# Patient Record
Sex: Female | Born: 1965 | Race: Black or African American | Hispanic: No | Marital: Married | Smoking: Never smoker
Health system: Southern US, Community
[De-identification: ages and names within clinical notes are randomized; demographics above are authoritative.]

## PROBLEM LIST (undated history)

## (undated) DIAGNOSIS — Z923 Personal history of irradiation: Secondary | ICD-10-CM

## (undated) DIAGNOSIS — C801 Malignant (primary) neoplasm, unspecified: Secondary | ICD-10-CM

## (undated) DIAGNOSIS — M2550 Pain in unspecified joint: Secondary | ICD-10-CM

## (undated) DIAGNOSIS — N611 Abscess of the breast and nipple: Secondary | ICD-10-CM

## (undated) DIAGNOSIS — Z9289 Personal history of other medical treatment: Secondary | ICD-10-CM

## (undated) DIAGNOSIS — C50919 Malignant neoplasm of unspecified site of unspecified female breast: Secondary | ICD-10-CM

## (undated) DIAGNOSIS — Z8709 Personal history of other diseases of the respiratory system: Secondary | ICD-10-CM

## (undated) HISTORY — PX: BREAST SURGERY: SHX581

## (undated) HISTORY — DX: Abscess of the breast and nipple: N61.1

## (undated) HISTORY — PX: TONSILLECTOMY: SUR1361

---

## 2004-07-07 ENCOUNTER — Emergency Department (HOSPITAL_COMMUNITY): Admission: EM | Admit: 2004-07-07 | Discharge: 2004-07-07 | Payer: Self-pay | Admitting: Emergency Medicine

## 2011-01-04 HISTORY — PX: DILATION AND CURETTAGE OF UTERUS: SHX78

## 2011-03-08 DIAGNOSIS — N611 Abscess of the breast and nipple: Secondary | ICD-10-CM

## 2011-03-08 HISTORY — PX: BREAST MASS EXCISION: SHX1267

## 2011-03-08 HISTORY — DX: Abscess of the breast and nipple: N61.1

## 2011-03-09 ENCOUNTER — Emergency Department (HOSPITAL_COMMUNITY)
Admission: EM | Admit: 2011-03-09 | Discharge: 2011-03-09 | Disposition: A | Discharge status: Home / Self Care | Payer: Medicaid Other | Attending: Emergency Medicine | Admitting: Emergency Medicine

## 2011-03-09 DIAGNOSIS — N61 Mastitis without abscess: Secondary | ICD-10-CM | POA: Insufficient documentation

## 2011-03-09 DIAGNOSIS — IMO0002 Reserved for concepts with insufficient information to code with codable children: Secondary | ICD-10-CM

## 2011-03-09 NOTE — ED Provider Notes (Signed)
History     CSN: [id number]  Arrival date & time 03/09/11  4098   First MD Initiated Contact with Patient 03/09/11 2208      Chief Complaint  Patient presents with  . Abscess    LT breast x last few months. Now "about to burst"    (Consider location/radiation/quality/duration/timing/severity/associated sxs/prior treatment) The history is provided by the patient.  Pt presents with CC of "abscess." She has had a growth to the L breast which has been present since approx Nov 2011; she stopped breast feeding her child in October and recalls it appeared several months later. It has been slowly growing in size since. She miscarried in Oct 2012 and had a D+C; her OB checked the area at that time and told her to return if the area got larger or ruptured. She feels that the area has been enlarging more rapidly since the miscarriage. She has not followed up with GYN on this since due to insurance problems.   Presents today as she feels increased pressure and pain in the area and feel like it is "about to pop." Denies fevers, chills, nausea, vomiting. Denies recent wt loss, malaise, appetite change. States she is not back on a regular menstrual cycle yet s/p SAB.  History reviewed. No pertinent past medical history.  Past Surgical History  Procedure Date  . Dilation and curettage of uterus     No family history on file.  History  Substance Use Topics  . Smoking status: Never Smoker   . Smokeless tobacco: Not on file  . Alcohol Use: No    OB History    Grav Para Term Preterm Abortions TAB SAB Ect Mult Living                  Review of Systems  Constitutional: Negative for fever, activity change, appetite change, fatigue and unexpected weight change.  HENT: Negative for facial swelling and neck pain.   Respiratory: Negative for cough, chest tightness and shortness of breath.   Cardiovascular: Negative for chest pain and palpitations.  Gastrointestinal: Negative for nausea and  vomiting.  Genitourinary: Positive for menstrual problem.  Skin: Positive for color change.  Neurological: Negative for dizziness, weakness and light-headedness.  Hematological: Negative for adenopathy.    Allergies  Sulfa antibiotics  Home Medications  No current outpatient prescriptions on file.  BP 127/43  Pulse 87  Temp(Src) 98.8 F (37.1 C) (Oral)  Resp 18  Ht 5\' 5"  (1.651 m)  Wt 175 lb (79.379 kg)  BMI 29.12 kg/m2  SpO2 100%  Physical Exam  Nursing note and vitals reviewed. Constitutional: She appears well-developed and well-nourished. No distress.  HENT:  Head: Normocephalic and atraumatic.  Eyes: Conjunctivae are normal. Right eye exhibits no discharge. Left eye exhibits no discharge.  Neck: Normal range of motion. Neck supple.  Cardiovascular: Normal rate, regular rhythm and normal heart sounds.   Pulmonary/Chest: Effort normal and breath sounds normal. She exhibits tenderness. Left breast exhibits mass, skin change and tenderness. Left breast exhibits no inverted nipple and no nipple discharge.    Abdominal: Soft. There is no tenderness.  Lymphadenopathy:    She has no cervical adenopathy.  Neurological: She is alert.  Skin: Skin is warm and dry. She is not diaphoretic.       See diagram above. Area of fluctuance c/w cyst (red area on diagram) measuring approximately 8x4 cm to L upper lateral breast. Area of very firm tissue concave to/beneath the cystic structure extending toward  the axilla and medially approximately to nipple line. Overlying skin is not erythematous or dimpled.  Psychiatric: She has a normal mood and affect.    ED Course  INCISION AND DRAINAGE Performed by: Grant Fontana Authorized by: Grant Fontana Consent: Verbal consent obtained. Risks and benefits: risks, benefits and alternatives were discussed Type: cyst Body area: trunk Location details: left breast Needle gauge: 18 Complexity: complex Drainage:  serosanguinous Drainage amount: copious Wound treatment: wound left open Packing material: none Patient tolerance: Patient tolerated the procedure well with no immediate complications.     Labs Reviewed - No data to display No results found.   1. Cyst       MDM  Pt case discussed with Dr. Rubin Payor, who saw the patient with me. We drained the area. The cystic area was drained of a large amount of serosanguinous material; the tissue underneath was quite firm to palpation. Given the length of time this has been present, it is certainly worrisome for a neoplastic process. Pt was strongly encouraged to make a f/u with GYN as well as gen surgery for further eval to include likely biopsy. Return precautions discussed. Pt verbalized understanding and agreed to plan.        Grant Fontana, [place] 03/10/11 1513

## 2011-03-09 NOTE — ED Notes (Signed)
Pt reports left breast abscess.Stopped breast feeding of October 2011. Pt reports it developed a few weeks after breastfeeding ended. Became much larger in the past few months. Pt reports she miscarried that pregnancy in October and it has since grown in size.  Pt reports MD looked at area during D/C and told her to come back if this area bursts.  Pt reports pain feels like strong pressure. Pt reports putting A/D ointment to help with lubrication.  Pt denies history of this and denies fevers.

## 2011-03-09 NOTE — ED Notes (Signed)
Pt reports abscess to left breast x44months - worse x2 months. Pt states it appears as though it is about to rupture. Pt denies any fever or body aches.

## 2011-03-10 NOTE — ED Provider Notes (Signed)
Medical screening examination/treatment/procedure(s) were performed by non-physician practitioner and as supervising physician I was immediately available for consultation/collaboration.  Juliet Rude. Rubin Payor, MD 03/10/11 2358

## 2011-03-11 ENCOUNTER — Ambulatory Visit (INDEPENDENT_AMBULATORY_CARE_PROVIDER_SITE_OTHER): Payer: Self-pay | Admitting: Surgery

## 2011-03-11 ENCOUNTER — Other Ambulatory Visit (INDEPENDENT_AMBULATORY_CARE_PROVIDER_SITE_OTHER): Payer: Self-pay | Admitting: Surgery

## 2011-03-11 ENCOUNTER — Encounter (INDEPENDENT_AMBULATORY_CARE_PROVIDER_SITE_OTHER): Payer: Self-pay | Admitting: Surgery

## 2011-03-11 VITALS — BP 118/70 | HR 68 | Temp 96.9°F | Resp 16 | Ht 65.0 in | Wt 191.0 lb

## 2011-03-11 DIAGNOSIS — N6320 Unspecified lump in the left breast, unspecified quadrant: Secondary | ICD-10-CM

## 2011-03-11 DIAGNOSIS — N6009 Solitary cyst of unspecified breast: Secondary | ICD-10-CM

## 2011-03-11 MED ORDER — HYDROCODONE-ACETAMINOPHEN 5-500 MG PO TABS: 1.0000 | ORAL_TABLET | ORAL | Status: AC | PRN

## 2011-03-11 NOTE — Patient Instructions (Signed)
Change the packing daily.  We will schedule your ultrasound on Monday and see you back on Tuesday.

## 2011-03-11 NOTE — Progress Notes (Signed)
Patient ID: Alyssa May, female   DOB: [birth year], 46 y.o.   MRN: [id number]  Chief Complaint  Patient presents with  . Follow-up    Breast abscess drained at [facility]    HPI Alyssa May is a 46 y.o. female.  Referred by Dr. Rubin May at [facility] for left breast mass HPI This is a healthy 46 year old female who first noticed a pea-sized mass in her left breast in November 2011. She had recently stopped breast-feeding. This area has been enlarging over the last year. In July of this last year she estimates that it was golfball sized. It has begun to protrude through the skin and causing some discomfort. The patient had a miscarriage in October 2012. She does not have a local primary care physician or a gynecologist. She presented to the emergency department on 03/09/11 2 to the increased discomfort. She was noted to have a 4 cm area of fluctuance with some fibrinous extending up towards the axilla and medially in the breast towards the nipple. The cystic area was aspirated and yielded a large amount of serosanguineous fluid. The patient states that the fluid has reaccumulated. Past Medical History  Diagnosis Date  . Breast abscess     Past Surgical History  Procedure Date  . Dilation and curettage of uterus 01/04/11    Family History  Problem Relation Age of Onset  . Diabetes Mother   . Hypertension Mother     Social History History  Substance Use Topics  . Smoking status: Never Smoker   . Smokeless tobacco: Never Used  . Alcohol Use: Yes     occasional    Allergies  Allergen Reactions  . Sulfa Antibiotics Swelling    Current Outpatient Prescriptions  Medication Sig Dispense Refill  . HYDROcodone-acetaminophen (VICODIN) 5-500 MG per tablet Take 1 tablet by mouth every 4 (four) hours as needed for pain.  30 tablet  0    Review of Systems Review of Systems  Constitutional: Negative for fever, chills and unexpected weight change.  HENT: Negative for hearing loss,  congestion, sore throat, trouble swallowing and voice change.   Eyes: Negative for visual disturbance.  Respiratory: Negative for cough and wheezing.   Cardiovascular: Negative for chest pain, palpitations and leg swelling.  Gastrointestinal: Negative for nausea, vomiting, abdominal pain, diarrhea, constipation, blood in stool, abdominal distention and anal bleeding.  Genitourinary: Negative for hematuria, vaginal bleeding and difficulty urinating.  Musculoskeletal: Negative for arthralgias.  Skin: Negative for rash and wound.  Neurological: Negative for seizures, syncope and headaches.  Hematological: Negative for adenopathy. Does not bruise/bleed easily.  Psychiatric/Behavioral: Negative for confusion.  Menarche - age 69 First pregnancy - age 62 Breastfeed - yes LMP - July Miscarriage October 2012 - has not restarted regular periods since then  Blood pressure 118/70, pulse 68, temperature 96.9 F (36.1 C), temperature source Temporal, resp. rate 16, height 5\' 5"  (1.651 m), weight 191 lb (86.637 kg).  Physical Exam Physical Exam Breasts - right breast - no masses, no nipple discharge or inversion, no axillary lymphadenopathy Left breast - 4 cm fluctuant, protruding area in the upper outer quadrant.  Surrounding this fluctuance, there is a wider 8-10 cm area of firmness extending towards the lower breast with some peau-d'orange skin changes. Data Reviewed None  Assessment    We made a 1 cm incision and drained a large amount of serosanguinous fluid.  This was sent for culture.  The cystic area decompressed completely.  It was packed with  1/4 inch nugauze.    The area of firmness could represent surrounding inflammation, but is also concerning for possible breast cancer.      Plan    Daily packing with nu-gauze. Reevaluate next week Left breast ultrasound.  Cannot do a mammogram at this time due to the open wound.  May require core biopsy vs skin biopsy to rule out malignancy.        Alyssa Brickman K. 03/11/2011, 3:28 PM

## 2011-03-14 ENCOUNTER — Ambulatory Visit
Admission: RE | Admit: 2011-03-14 | Discharge: 2011-03-14 | Disposition: A | Discharge status: Home / Self Care | Payer: Self-pay | Source: Ambulatory Visit | Attending: Surgery | Admitting: Surgery

## 2011-03-14 DIAGNOSIS — N6320 Unspecified lump in the left breast, unspecified quadrant: Secondary | ICD-10-CM

## 2011-03-14 LAB — WOUND CULTURE: Organism ID, Bacteria: NO GROWTH

## 2011-03-15 ENCOUNTER — Ambulatory Visit (INDEPENDENT_AMBULATORY_CARE_PROVIDER_SITE_OTHER): Payer: Self-pay | Admitting: Surgery

## 2011-03-15 ENCOUNTER — Encounter (INDEPENDENT_AMBULATORY_CARE_PROVIDER_SITE_OTHER): Payer: Self-pay | Admitting: Surgery

## 2011-03-15 VITALS — BP 124/86 | HR 96 | Temp 98.7°F | Ht 65.0 in | Wt 190.6 lb

## 2011-03-15 DIAGNOSIS — N6320 Unspecified lump in the left breast, unspecified quadrant: Secondary | ICD-10-CM

## 2011-03-15 DIAGNOSIS — N63 Unspecified lump in unspecified breast: Secondary | ICD-10-CM

## 2011-03-15 NOTE — Patient Instructions (Signed)
Neosporin over the wound daily.  Cover with dry gauze until the wound is completely healed.

## 2011-03-15 NOTE — Progress Notes (Signed)
The patient returns for a wound check.  The large fluctuant area is decompressed and appears free of infection.  I removed the packing.  The area surrounding the formerly fluctuant area remains very firm.  She will treat this area with neosporin and a dry dressing until healed.  She had an ultrasound yesterday which showed a 6.7 x 7.3 x 5.1 cm mass with enlarged adjacent lymph nodes in the axilla.  These findings are concerning for carcinoma.  The patient is trying to make some financial arrangements and then we will proceed with ultrasound-guided core biopsy to establish a definitive diagnosis.  I spent some time with the patient and her husband discussing the probability of this mass representing breast carcinoma.  We discussed the possibilities for treatment and surgery, which we will discuss further after establishing a diagnosis.  Follow-up in 2 weeks.  She will contact us when the financial situation is finalized.    Wilmon Arms. Corliss Skains, MD, [facility]  03/15/2011 3:15 PM

## 2011-03-18 ENCOUNTER — Ambulatory Visit
Admission: RE | Admit: 2011-03-18 | Discharge: 2011-03-18 | Disposition: A | Discharge status: Home / Self Care | Payer: No Typology Code available for payment source | Source: Ambulatory Visit | Attending: Obstetrics and Gynecology | Admitting: Obstetrics and Gynecology

## 2011-03-18 ENCOUNTER — Other Ambulatory Visit: Payer: Self-pay | Admitting: Obstetrics and Gynecology

## 2011-03-18 ENCOUNTER — Other Ambulatory Visit: Payer: Self-pay | Admitting: Diagnostic Radiology

## 2011-03-18 ENCOUNTER — Ambulatory Visit (INDEPENDENT_AMBULATORY_CARE_PROVIDER_SITE_OTHER): Payer: Medicaid Other | Admitting: *Deleted

## 2011-03-18 VITALS — BP 128/86 | HR 92 | Temp 98.5°F | Ht 65.0 in | Wt 193.3 lb

## 2011-03-18 DIAGNOSIS — N63 Unspecified lump in unspecified breast: Secondary | ICD-10-CM

## 2011-03-18 DIAGNOSIS — N6320 Unspecified lump in the left breast, unspecified quadrant: Secondary | ICD-10-CM

## 2011-03-18 DIAGNOSIS — Z01419 Encounter for gynecological examination (general) (routine) without abnormal findings: Secondary | ICD-10-CM

## 2011-03-18 NOTE — Progress Notes (Signed)
Complaints of left breast lump.  Pap Smear:    Completed Pap smear today. Last Pap smear was at least 3 years ago per patient and was normal. Per patient no history of abnormal Pap smears.  No Pap smear results in EPIC.  Physical exam: Breasts Left breast larger than right breast. Per patient her left breast has grown in size for the last year. No skin abnormalities right breast. Left breast lump that has protruded through skin at 1 o'clock 9-14 cm from left nipple. Lump draining serous colored fluid. Skin surround lump has the peu-d'orange appearance. No nipple retraction bilateral breasts. No nipple discharge bilateral breasts. No lymphadenopathy right axilla. Lymphadenopathy in left axilla.. No lumps palpated right breast. Palpated firm area left upper outer quadrant of breast with lump protruding at 1 o'clock. [facility] for left breast biopsy. Appointment scheduled for this afternoon at 1300.    Pelvic/Bimanual   Ext Genitalia No lesions, no swelling and no discharge observed on external genitalia.         Vagina Vagina pink and normal texture. No lesions observed in vagina. Sperm observed in vagina. Per patient has had recent sexual intercourse.     Cervix Cervix is present. Cervix pink and of normal texture. No discharge observed at cervical os.    Uterus Uterus is present and palpable. Uterus in normal position and normal size.       Adnexae Bilateral ovaries present and palpable. No tenderness on palpation.        Rectovaginal No rectal exam completed today since patient had no rectal complaints. No skin abnormalities observed at rectal area.

## 2011-03-18 NOTE — Patient Instructions (Signed)
Taught patient how to perform BSE and gave educational materials to take home. Completed Pap smear today and let her know BCCCP will cover Pap smears every 3 years unless has a history of abnormal Pap smears. Patient is scheduled for a left breast biopsy this afternoon at 1300. Patient aware of appointment and will be there. Let patient know will follow up with her within the next couple weeks with results by letter or phone. Patient verbalized understanding.

## 2011-03-21 ENCOUNTER — Other Ambulatory Visit: Payer: Self-pay | Admitting: Obstetrics and Gynecology

## 2011-03-21 ENCOUNTER — Telehealth (INDEPENDENT_AMBULATORY_CARE_PROVIDER_SITE_OTHER): Payer: Self-pay | Admitting: General Surgery

## 2011-03-21 ENCOUNTER — Other Ambulatory Visit (INDEPENDENT_AMBULATORY_CARE_PROVIDER_SITE_OTHER): Payer: Self-pay | Admitting: Surgery

## 2011-03-21 ENCOUNTER — Ambulatory Visit
Admission: RE | Admit: 2011-03-21 | Discharge: 2011-03-21 | Disposition: A | Discharge status: Home / Self Care | Payer: No Typology Code available for payment source | Source: Ambulatory Visit | Attending: Obstetrics and Gynecology | Admitting: Obstetrics and Gynecology

## 2011-03-21 DIAGNOSIS — N63 Unspecified lump in unspecified breast: Secondary | ICD-10-CM

## 2011-03-21 DIAGNOSIS — C50912 Malignant neoplasm of unspecified site of left female breast: Secondary | ICD-10-CM

## 2011-03-21 DIAGNOSIS — C50919 Malignant neoplasm of unspecified site of unspecified female breast: Secondary | ICD-10-CM

## 2011-03-21 NOTE — Telephone Encounter (Addendum)
I talked to [facility] from [facility] and she will call the pt tomorrow 03-22-11 to giver her a appt to see Dr Darnelle Catalan on the 03-24-11 or if she wants to go the [place] office she will call pt and schedule her there. And Dawn will call me and let me know what the pt will do

## 2011-03-22 ENCOUNTER — Telehealth: Payer: Self-pay | Admitting: *Deleted

## 2011-03-22 NOTE — Telephone Encounter (Signed)
Attempted to phone patient for BCCCP-MCD process, no answer.  Left message on home answering service with contact information.  Also tried cell but number was disconnected.

## 2011-03-24 ENCOUNTER — Telehealth: Payer: Self-pay | Admitting: *Deleted

## 2011-03-24 NOTE — Telephone Encounter (Signed)
Confirmed 03/28/11 appt w/ pt.  Unable to mail before appt letter - gave verbal.  Requested [place] & Dr. Darnelle Catalan.

## 2011-03-25 ENCOUNTER — Inpatient Hospital Stay (HOSPITAL_COMMUNITY): Admission: RE | Admit: 2011-03-25 | Payer: No Typology Code available for payment source | Source: Ambulatory Visit

## 2011-03-28 ENCOUNTER — Other Ambulatory Visit: Payer: No Typology Code available for payment source | Admitting: Lab

## 2011-03-28 ENCOUNTER — Other Ambulatory Visit (HOSPITAL_COMMUNITY): Payer: No Typology Code available for payment source

## 2011-03-28 ENCOUNTER — Ambulatory Visit: Payer: No Typology Code available for payment source

## 2011-03-28 ENCOUNTER — Ambulatory Visit (HOSPITAL_COMMUNITY)
Admission: RE | Admit: 2011-03-28 | Discharge: 2011-03-28 | Disposition: A | Discharge status: Home / Self Care | Payer: Medicaid Other | Source: Ambulatory Visit | Attending: Obstetrics and Gynecology | Admitting: Obstetrics and Gynecology

## 2011-03-28 ENCOUNTER — Ambulatory Visit (HOSPITAL_BASED_OUTPATIENT_CLINIC_OR_DEPARTMENT_OTHER): Payer: Medicaid Other | Admitting: Oncology

## 2011-03-28 ENCOUNTER — Other Ambulatory Visit: Payer: Self-pay | Admitting: *Deleted

## 2011-03-28 VITALS — BP 129/83 | HR 96 | Temp 98.5°F | Ht 65.0 in | Wt 187.4 lb

## 2011-03-28 DIAGNOSIS — C773 Secondary and unspecified malignant neoplasm of axilla and upper limb lymph nodes: Secondary | ICD-10-CM | POA: Insufficient documentation

## 2011-03-28 DIAGNOSIS — C50419 Malignant neoplasm of upper-outer quadrant of unspecified female breast: Secondary | ICD-10-CM | POA: Insufficient documentation

## 2011-03-28 DIAGNOSIS — N62 Hypertrophy of breast: Secondary | ICD-10-CM | POA: Insufficient documentation

## 2011-03-28 DIAGNOSIS — R599 Enlarged lymph nodes, unspecified: Secondary | ICD-10-CM | POA: Insufficient documentation

## 2011-03-28 MED ORDER — GADOBENATE DIMEGLUMINE 529 MG/ML IV SOLN
17.0000 mL | Freq: Once | INTRAVENOUS | Status: AC | PRN
  Administered 2011-03-28: 17 mL via INTRAVENOUS

## 2011-03-28 NOTE — Progress Notes (Signed)
ID: Alyssa May  DOB: [birth year]  MR#: [id number]  YQM#:[id number]  HPI: Alyssa May is a 46 year-old [place] woman who first noted a small mass in her Left breast November 2011, shortly after she stopped breast-feeding. It did not disappear with resumption of her periods and grew over the next year, and particularly since she had her miscarrriage October 2012. This brought her to the Emergency Room 03/09/2011 where she was found to have a fluctuant area in the UOQ of the Left breast measuring 8.4 cm. The tissue beneath the cystic area extending towards the axilla and medially was described as firm, but not erythematous. The cystic area was lanced and "a large amount of serosanguinos material" issued. The patient was referred to Dr. Marcille Blanco who saw her 03/11/2011. By this time the fluid had largely reaccumulated.[name] He incised the lesion, again draining serosanguinous liquid, packed it, and set the patient up for Left breast US at the [facility] 03/14/2011. This showed an irregularly marginated mass measuring 7.3 cm, with abnormal axillary adenopathy. On 01/1102013 bilateral mammography confirmed a high-density mass in the Left UOQ. This was biopsied, as was a Left axillary lymph node. [place] biopsies ([id number]) showed invasive ductal carcinoma, high grade, triple negative, with an Mib-1 of 97%.  The patient now presents for further discussion and a treatment plan.   ROS:  She has tolerated the procedures well, without significant bleeding, fever or pain. She is still having some drainage. Aside from the breast problem she is having some hot flashes, but no other systemic symptoms. A detailed review of systems was noncontributory  Allergies  Allergen Reactions  . Sulfa Antibiotics Swelling    Current Outpatient Prescriptions  Medication Sig Dispense Refill  . HYDROcodone-acetaminophen (NORCO) 5-325 MG per tablet Take 1 tablet by mouth every 6 (six) hours as needed.      . NON FORMULARY Pt  takes multiple supplements and vitamins Sees a holistic practioner       PMHx: s/p T&A; s/p D&C October 20   Family MW:[id number]'G father died from post-operative complications at age 59; the patient's mother is 40, lives in [place] The patient is a single child  GYN Hx: GX P1, first pregnancy to term age 61. 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. Her husband [name] is a Hospital doctor for a Acupuncturist in [place]. Their daughter, [name], is 3. The family stays part-time with the patient's mother in [place]  Objective: a young-appearing African-American woman  Filed Vitals:   03/28/11 1651  BP: 129/83  Pulse: 96  Temp: 98.5 F (36.9 C)    BMI: Body mass index is 31.18 kg/(m^2).   ECOG FS: 0  Physical Exam:   Sclerae unicteric  Oropharynx clear  No peripheral adenopathy  Lungs clear -- no rales or rhonchi  Heart regular rate and rhythm  Abdomen benign  MSK no focal spinal tenderness, no peripheral edema  Neuro nonfocal  Breast exam: the Right breast is unremarkable; the Left breast has a large solid hyperpigmented area in the UOQ measuring about 7 cm; the incisions are minimally leaking (clear drainage on bandage). The is a faint blush of erythema inferiorly in the breast (below the nipple) which the patient tells me was not present before the drainage and biopsy procedures--this may be reactive; it does not suggest cellulitis  Lab Results:      Chemistry  No results found for this basename: NA, K, CL, CO2, BUN, CREATININE, GLU   No results found for this basename: CALCIUM, ALKPHOS, AST, ALT, BILITOT       No results found for this basename: WBC, HGB, HCT, MCV, PLT, NEUTROABS    Studies/Results:  US Breast Left  03/14/2011  *RADIOLOGY REPORT*  Clinical Data:  The patient noted a mass in the left upper outer quadrant over 1 year ago.  She states that this has  gradually increased in size.   An incisional drainage was performed by the surgeon on 03/11/2011.  LEFT BREAST ULTRASOUND  Comparison:  None.  On physical exam, there is  an 8 cm protruding mass with a central incision in the 1 o'clock position of the left breast, approximately 10 cm from the left nipple.  The incision site has been packed.  Findings: Ultrasound is performed, showing an irregularly marginated mass at 1 o'clock 9-14 cm from the left nipple.  It is difficult to visualize the entire mass due to the incision and packing material.  This measures at least  6.7 x 7.3 x 5.1 cm.  The appearance is concerning for breast carcinoma.  Sonography of the left axilla demonstrates abnormal lymph nodes.  The patient will need bilateral diagnostic mammography when clinically feasible.  I would suggest ultrasound-guided core needle biopsy of the left breast mass and any other suspicious findings after the mammogram is done.  The patient has no insurance.  One may wish to consider referral to BCCCP (Breast and Cervical Cancer Control Program) prior to performance of a biopsy.  IMPRESSION: Suspicious mass in the left upper outer quadrant.  Suggest bilateral diagnostic mammography.  The patient will likely need ultrasound-guided core needle biopsy of the left breast mass.  One may wish to consider referral to BCCCP prior to performance of any biopsies.  BI-RADS CATEGORY 4:  Suspicious abnormality - biopsy should be considered.  Original Report Authenticated By: Daryl Eastern, M.D.    *RADIOLOGY REPORT*  Clinical Data:  Left breast mass  DIGITAL DIAGNOSTIC BILATERAL MAMMOGRAM WITH CAD  Comparison:  Ultrasound dated 03/14/2011  Findings:  There is a fibroglandular pattern.  A round, protuberant, high density mass with ill-defined margins is seen in the upper-outer quadrant of the left breast, posteriorly.  Locules of gas are seen in association with the mass in the upper-outer quadrant.  Skin thickening is noted in  the left breast.  No mass or malignant type calcifications are seen in the right breast. This correlates with the mass seen on most recent ultrasound.  Mammographic images were processed with CAD.  IMPRESSION: Mass, left breast which biopsy is done reported separately.  No mammographic evidence of malignancy, right breast.  BI-RADS CATEGORY 4:  Suspicious abnormality - biopsy should be considered.  Original Report Authenticated By: Hiram Gash, M.D.   Assessment: 46 year-old [place] woman s/p Left breast biopsy 03/18/2011 showing a clinical T3-4, N1, Stage III invasive ductal carcinoma, high grade, triple negative, with an Mib-1 of 97%   Plan: we spent the better part of her hour visit discussing Breast Cancer, the biology or her lesion, and treatment plans. She needs staging, and I am setting her up for labwork, Chest CT, PET, and echocardiography. She will need a port and will discuss this with Dr. Corliss Skains when she sees him tomorrow. She will come to chemo school to learn about chemotherapy side effect management and will see me again in about 10 days, by which time we  should have all the data in and can start to operationalize her chemotherapy.  I offered her the option of receiving some of her treatments in [place], but at this point she prefers to come to our clinic in [place].  MAGRINAT,GUSTAV C 03/28/2011

## 2011-03-29 ENCOUNTER — Encounter: Payer: Self-pay | Admitting: *Deleted

## 2011-03-29 ENCOUNTER — Other Ambulatory Visit: Payer: No Typology Code available for payment source

## 2011-03-29 ENCOUNTER — Telehealth: Payer: Self-pay | Admitting: Oncology

## 2011-03-29 ENCOUNTER — Ambulatory Visit: Payer: No Typology Code available for payment source

## 2011-03-29 DIAGNOSIS — C50419 Malignant neoplasm of upper-outer quadrant of unspecified female breast: Secondary | ICD-10-CM

## 2011-03-29 LAB — COMPREHENSIVE METABOLIC PANEL
ALT: 11 U/L (ref 0–35)
BUN: 4 mg/dL — ABNORMAL LOW (ref 6–23)
CO2: 25 mEq/L (ref 19–32)
Calcium: 9.2 mg/dL (ref 8.4–10.5)
Chloride: 95 mEq/L — ABNORMAL LOW (ref 96–112)
Creatinine, Ser: 0.73 mg/dL (ref 0.50–1.10)
Glucose, Bld: 97 mg/dL (ref 70–99)

## 2011-03-29 LAB — CBC WITH DIFFERENTIAL/PLATELET
EOS%: 0.4 % (ref 0.0–7.0)
Eosinophils Absolute: 0 10*3/uL (ref 0.0–0.5)
MCH: 20.1 pg — ABNORMAL LOW (ref 25.1–34.0)
MCV: 67 fL — ABNORMAL LOW (ref 79.5–101.0)
MONO%: 8.3 % (ref 0.0–14.0)
NEUT#: 5.6 10*3/uL (ref 1.5–6.5)
RBC: 4.27 10*6/uL (ref 3.70–5.45)
RDW: 20.7 % — ABNORMAL HIGH (ref 11.2–14.5)
nRBC: 0 % (ref 0–0)

## 2011-03-29 LAB — HCG, SERUM, QUALITATIVE: Preg, Serum: NEGATIVE

## 2011-03-29 LAB — CANCER ANTIGEN 27.29: CA 27.29: 16 U/mL (ref 0–39)

## 2011-03-29 NOTE — Telephone Encounter (Signed)
Pt called to cancel her appts for today °

## 2011-03-30 ENCOUNTER — Ambulatory Visit (INDEPENDENT_AMBULATORY_CARE_PROVIDER_SITE_OTHER): Payer: PRIVATE HEALTH INSURANCE | Admitting: Surgery

## 2011-03-30 ENCOUNTER — Telehealth: Payer: Self-pay | Admitting: *Deleted

## 2011-03-30 ENCOUNTER — Encounter (HOSPITAL_BASED_OUTPATIENT_CLINIC_OR_DEPARTMENT_OTHER): Payer: Self-pay | Admitting: *Deleted

## 2011-03-30 ENCOUNTER — Encounter (INDEPENDENT_AMBULATORY_CARE_PROVIDER_SITE_OTHER): Payer: Self-pay | Admitting: Surgery

## 2011-03-30 DIAGNOSIS — N61 Mastitis without abscess: Secondary | ICD-10-CM

## 2011-03-30 DIAGNOSIS — C50419 Malignant neoplasm of upper-outer quadrant of unspecified female breast: Secondary | ICD-10-CM

## 2011-03-30 NOTE — Progress Notes (Addendum)
The patient returns after her consultation with Dr. Darnelle Catalan and her MRI on 1/21.  She is having a lot of pain in her left breast with purulent drainage from the I&D site.  Dr. Darrall Dears tentative plan is to proceed with neoadjuvant chemotherapy.  She has undergone chemo class and understands the need for a port.    MRI report below.      **ADDENDUM** CREATED: 03/29/2011 13:20:15   There is also diffuse sternal enhancement.  This is nonspecific and could be an indication of bony metastatic disease.   Addended by:  Londell Moh. Azucena Kuba, M.D. on 03/29/2011 13:20:15.   **END ADDENDUM** SIGNED BY: Londell Moh. Azucena Kuba, M.D.       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   BILATERAL BREAST MRI WITH AND WITHOUT CONTRAST   Technique: Multiplanar, multisequence MR images of both breasts were obtained prior to and following the intravenous administration of 17ml of MultiHance.  Three dimensional images were evaluated at the independent DynaCad workstation.   Comparison:  Recent mammogram, ultrasound and biopsy examinations.   Findings: Large, irregularly marginated enhancing mass deep in the upper outer quadrant of the left breast, protruding from the breast. This measures 10.2 x 7.6 x 6.7 cm in maximum dimensions. This contains a biopsy marker clip artifact laterally.  This has a central nonenhancing portion containing a fluid/hematocrit or fluid/protein level.  This corresponds to an area recently incised and drained by history.  This mass has a mixture of enhancement kinetics, including rapid washin/washout.  The mass is abutting the anterolateral portions of the left pectoralis major and minor muscles with loss of the normal fat plane between the mass and the muscles.  It is difficult to determine if there is actual extension into the muscles.   Also demonstrated is diffuse enlargement of the left breast relative to the  right breast with marked diffuse skin thickening on the left.  There is also mild diffuse skin enhancement on the left as well as mild diffuse inhomogeneous enhancement in the majority of the parenchyma of the left breast.   Multiple enlarged left axillary lymph nodes are demonstrated lateral to the pectoralis minor muscle on the left as well as an enlarged lymph node posterior to the lateral aspect of the pectoralis minor muscle on the left.  The largest node measures 1.9 cm in short axis diameter on inversion recovery image number 48.   Minimal background parenchymal enhancement in the right breast. Scattered small nodular areas of enhancement in the right breast with no dominant masses or areas of enhancement suspicious for malignancy.  No abnormal appearing internal mammary or right axillary lymph nodes.   IMPRESSION:   1.  10.2 x 7.6 x 6.7 cm biopsy-proven invasive ductal carcinoma deep in the upper outer quadrant of the left breast extending into and through the skin of the breast.  This is abutting the pectoralis major and minor muscles with possible minimal muscle invasion. 2.  Multiple metastatic level I left axillary lymph nodes and a single metastatic level II left axillary lymph node. 3.  Diffuse enlargement, edematous changes and enhancement in the left breast compatible with changes due to an inflammatory component of the patient's known left breast breast carcinoma. 4.  No evidence of malignancy on the right.   THREE-DIMENSIONAL MR IMAGE RENDERING ON INDEPENDENT WORKSTATION:   Three-dimensional MR images were rendered by post-processing of the original MR data on an independent workstation.  The three- dimensional MR images were interpreted, and findings were reported in the accompanying complete MRI report for this study.   BI-RADS CATEGORY 6:  Known biopsy-proven malignancy - appropriate action should be taken.   Recommendation:  Treatment plan.   Original  Report Authenticated By: Darrol Angel, M.D.       The left breast has three small openings with some purulent fluid draining.  There is some overlying erythema, as well as the previously noted firm breast cancer.    Imp:  Left breast cancer with central necrosis and abscess with partial drainage  Plan:  Prior to port insertion, I think the patient would benefit from a wide incision and drainage under anesthesia with washout of the breast abscess.  Once the infection starts to clear up, we will proceed with port insertion within the next 1-2 weeks.  I am concerned that inserting a port in the presence of an active abscess might result in bacteremia with an infected port.  We will try to schedule the incision and drainage for tomorrow.  Wilmon Arms. Corliss Skains, MD, [facility]  03/30/2011 1:18 PM

## 2011-03-30 NOTE — Patient Instructions (Signed)
We will schedule your breast abscess drainage for tomorrow and then we will schedule your port insertion for the next 1-2 weeks.

## 2011-03-30 NOTE — Telephone Encounter (Signed)
Called patient at 1315 to get information for her [employer] Medicaid and discuss a time to meet for her to sign needed paperwork. Patient called me back at 1420 and will meet me next Monday, January 28th at 1100 to complete her [facility] Medicaid paperwork. Gave patient results to Pap smear. Let her know her Pap smear was normal and she will not need one for 3 years per BCCCP guidelines. Patient verbalized understanding.

## 2011-03-31 ENCOUNTER — Encounter: Payer: Self-pay | Admitting: *Deleted

## 2011-03-31 ENCOUNTER — Encounter (HOSPITAL_BASED_OUTPATIENT_CLINIC_OR_DEPARTMENT_OTHER): Payer: Self-pay | Admitting: Certified Registered Nurse Anesthetist

## 2011-03-31 ENCOUNTER — Encounter (HOSPITAL_BASED_OUTPATIENT_CLINIC_OR_DEPARTMENT_OTHER)
Admission: RE | Disposition: A | Discharge status: Home / Self Care | Payer: Self-pay | Source: Ambulatory Visit | Attending: Surgery

## 2011-03-31 ENCOUNTER — Other Ambulatory Visit (INDEPENDENT_AMBULATORY_CARE_PROVIDER_SITE_OTHER): Payer: Self-pay | Admitting: Surgery

## 2011-03-31 ENCOUNTER — Ambulatory Visit (HOSPITAL_BASED_OUTPATIENT_CLINIC_OR_DEPARTMENT_OTHER): Payer: Medicaid Other | Admitting: Certified Registered Nurse Anesthetist

## 2011-03-31 ENCOUNTER — Ambulatory Visit (HOSPITAL_BASED_OUTPATIENT_CLINIC_OR_DEPARTMENT_OTHER)
Admission: RE | Admit: 2011-03-31 | Discharge: 2011-03-31 | Disposition: A | Discharge status: Home / Self Care | Payer: Medicaid Other | Source: Ambulatory Visit | Attending: Surgery | Admitting: Surgery

## 2011-03-31 DIAGNOSIS — N61 Mastitis without abscess: Secondary | ICD-10-CM

## 2011-03-31 DIAGNOSIS — C50419 Malignant neoplasm of upper-outer quadrant of unspecified female breast: Secondary | ICD-10-CM | POA: Insufficient documentation

## 2011-03-31 DIAGNOSIS — C801 Malignant (primary) neoplasm, unspecified: Secondary | ICD-10-CM | POA: Insufficient documentation

## 2011-03-31 HISTORY — DX: Malignant (primary) neoplasm, unspecified: C80.1

## 2011-03-31 HISTORY — PX: INCISION AND DRAINAGE BREAST ABSCESS: SUR672

## 2011-03-31 SURGERY — INCISION AND DRAINAGE, ABSCESS
Anesthesia: Monitor Anesthesia Care | Site: Breast | Laterality: Left | Wound class: Dirty or Infected

## 2011-03-31 MED ORDER — PROMETHAZINE HCL 25 MG/ML IJ SOLN: 12.5000 mg | Freq: Four times a day (QID) | INTRAMUSCULAR | Status: DC | PRN

## 2011-03-31 MED ORDER — MIDAZOLAM HCL 2 MG/2ML IJ SOLN: 0.5000 mg | INTRAMUSCULAR | Status: DC | PRN

## 2011-03-31 MED ORDER — ONDANSETRON HCL 4 MG/2ML IJ SOLN
INTRAMUSCULAR | Status: DC | PRN
  Administered 2011-03-31: 4 mg via INTRAVENOUS

## 2011-03-31 MED ORDER — OXYCODONE-ACETAMINOPHEN 5-325 MG PO TABS: 1.0000 | ORAL_TABLET | ORAL | Status: AC | PRN

## 2011-03-31 MED ORDER — ACETAMINOPHEN 10 MG/ML IV SOLN
1000.0000 mg | Freq: Once | INTRAVENOUS | Status: AC
  Administered 2011-03-31: 1000 mg via INTRAVENOUS

## 2011-03-31 MED ORDER — METOCLOPRAMIDE HCL 5 MG/ML IJ SOLN: 10.0000 mg | Freq: Once | INTRAMUSCULAR | Status: DC | PRN

## 2011-03-31 MED ORDER — ONDANSETRON HCL 4 MG/2ML IJ SOLN: 4.0000 mg | INTRAMUSCULAR | Status: DC | PRN

## 2011-03-31 MED ORDER — ACETAMINOPHEN 650 MG RE SUPP: 650.0000 mg | RECTAL | Status: DC | PRN

## 2011-03-31 MED ORDER — FENTANYL CITRATE 0.05 MG/ML IJ SOLN
25.0000 ug | INTRAMUSCULAR | Status: DC | PRN
  Administered 2011-03-31: 50 ug via INTRAVENOUS

## 2011-03-31 MED ORDER — SODIUM CHLORIDE 0.9 % IJ SOLN: 3.0000 mL | Freq: Two times a day (BID) | INTRAMUSCULAR | Status: DC

## 2011-03-31 MED ORDER — OXYCODONE HCL 5 MG PO TABS
5.0000 mg | ORAL_TABLET | ORAL | Status: DC | PRN
  Administered 2011-03-31: 5 mg via ORAL

## 2011-03-31 MED ORDER — 0.9 % SODIUM CHLORIDE (POUR BTL) OPTIME
TOPICAL | Status: DC | PRN
  Administered 2011-03-31: 1000 mL

## 2011-03-31 MED ORDER — LACTATED RINGERS IV SOLN
INTRAVENOUS | Status: DC
  Administered 2011-03-31 (×2): via INTRAVENOUS

## 2011-03-31 MED ORDER — MORPHINE SULFATE 2 MG/ML IJ SOLN: 2.0000 mg | INTRAMUSCULAR | Status: DC | PRN

## 2011-03-31 MED ORDER — MIDAZOLAM HCL 5 MG/5ML IJ SOLN
INTRAMUSCULAR | Status: DC | PRN
  Administered 2011-03-31: 2 mg via INTRAVENOUS

## 2011-03-31 MED ORDER — MORPHINE SULFATE 2 MG/ML IJ SOLN: 0.0500 mg/kg | INTRAMUSCULAR | Status: DC | PRN

## 2011-03-31 MED ORDER — PROPOFOL 10 MG/ML IV EMUL
INTRAVENOUS | Status: DC | PRN
  Administered 2011-03-31: 75 ug/kg/min via INTRAVENOUS

## 2011-03-31 MED ORDER — SODIUM CHLORIDE 0.9 % IV SOLN: 250.0000 mL | INTRAVENOUS | Status: DC | PRN

## 2011-03-31 MED ORDER — LIDOCAINE HCL (CARDIAC) 20 MG/ML IV SOLN
INTRAVENOUS | Status: DC | PRN
  Administered 2011-03-31: 60 mg via INTRAVENOUS

## 2011-03-31 MED ORDER — BUPIVACAINE-EPINEPHRINE PF 0.25-1:200000 % IJ SOLN
INTRAMUSCULAR | Status: DC | PRN
  Administered 2011-03-31: 12 mL

## 2011-03-31 MED ORDER — ACETAMINOPHEN 325 MG PO TABS: 650.0000 mg | ORAL_TABLET | ORAL | Status: DC | PRN

## 2011-03-31 MED ORDER — FENTANYL CITRATE 0.05 MG/ML IJ SOLN
INTRAMUSCULAR | Status: DC | PRN
  Administered 2011-03-31: 50 ug via INTRAVENOUS

## 2011-03-31 MED ORDER — SODIUM CHLORIDE 0.9 % IJ SOLN: 3.0000 mL | INTRAMUSCULAR | Status: DC | PRN

## 2011-03-31 MED ORDER — CEFAZOLIN SODIUM-DEXTROSE 2-3 GM-% IV SOLR
2.0000 g | INTRAVENOUS | Status: AC
  Administered 2011-03-31: 2 g via INTRAVENOUS

## 2011-03-31 SURGICAL SUPPLY — 45 items
BANDAGE GAUZE ELAST BULKY 4 IN (GAUZE/BANDAGES/DRESSINGS) ×2 IMPLANT
BENZOIN TINCTURE PRP APPL 2/3 (GAUZE/BANDAGES/DRESSINGS) IMPLANT
BLADE HEX COATED 2.75 (ELECTRODE) ×2 IMPLANT
BLADE SURG 15 STRL LF DISP TIS (BLADE) ×1 IMPLANT
BLADE SURG 15 STRL SS (BLADE) ×1
CANISTER SUCTION 1200CC (MISCELLANEOUS) ×2 IMPLANT
CHLORAPREP W/TINT 26ML (MISCELLANEOUS) IMPLANT
CLOTH BEACON ORANGE TIMEOUT ST (SAFETY) ×2 IMPLANT
COVER MAYO STAND STRL (DRAPES) ×2 IMPLANT
COVER TABLE BACK 60X90 (DRAPES) ×2 IMPLANT
DECANTER SPIKE VIAL GLASS SM (MISCELLANEOUS) IMPLANT
DRAPE PED LAPAROTOMY (DRAPES) ×2 IMPLANT
DRAPE UTILITY XL STRL (DRAPES) ×2 IMPLANT
DRSG PAD ABDOMINAL 8X10 ST (GAUZE/BANDAGES/DRESSINGS) ×2 IMPLANT
DRSG TEGADERM 4X4.75 (GAUZE/BANDAGES/DRESSINGS) IMPLANT
ELECT REM PT RETURN 9FT ADLT (ELECTROSURGICAL) ×2
ELECTRODE REM PT RTRN 9FT ADLT (ELECTROSURGICAL) ×1 IMPLANT
GAUZE SPONGE 4X4 12PLY STRL LF (GAUZE/BANDAGES/DRESSINGS) ×2 IMPLANT
GLOVE BIO SURGEON STRL SZ 6.5 (GLOVE) ×2 IMPLANT
GLOVE BIO SURGEON STRL SZ7 (GLOVE) ×4 IMPLANT
GLOVE BIOGEL PI IND STRL 7.0 (GLOVE) ×1 IMPLANT
GLOVE BIOGEL PI INDICATOR 7.0 (GLOVE) ×1
GOWN PREVENTION PLUS XLARGE (GOWN DISPOSABLE) ×6 IMPLANT
HEMOSTAT SURGICEL 2X14 (HEMOSTASIS) ×2 IMPLANT
NEEDLE HYPO 25X1 1.5 SAFETY (NEEDLE) ×2 IMPLANT
NS IRRIG 1000ML POUR BTL (IV SOLUTION) ×2 IMPLANT
PACK BASIN DAY SURGERY FS (CUSTOM PROCEDURE TRAY) ×2 IMPLANT
PENCIL BUTTON HOLSTER BLD 10FT (ELECTRODE) ×2 IMPLANT
SLEEVE SCD COMPRESS KNEE MED (MISCELLANEOUS) ×2 IMPLANT
SPONGE LAP 4X18 X RAY DECT (DISPOSABLE) ×2 IMPLANT
STRIP CLOSURE SKIN 1/2X4 (GAUZE/BANDAGES/DRESSINGS) IMPLANT
SUT CHROMIC 3 0 SH 27 (SUTURE) IMPLANT
SUT MON AB 4-0 PC3 18 (SUTURE) IMPLANT
SUT VIC AB 3-0 SH 27 (SUTURE)
SUT VIC AB 3-0 SH 27X BRD (SUTURE) IMPLANT
SWAB CULTURE LIQ STUART DBL (MISCELLANEOUS) IMPLANT
SYR BULB 3OZ (MISCELLANEOUS) ×2 IMPLANT
SYR CONTROL 10ML LL (SYRINGE) ×2 IMPLANT
TOWEL OR 17X24 6PK STRL BLUE (TOWEL DISPOSABLE) IMPLANT
TOWEL OR NON WOVEN STRL DISP B (DISPOSABLE) ×2 IMPLANT
TRAY DSU PREP LF (CUSTOM PROCEDURE TRAY) ×2 IMPLANT
TUBE ANAEROBIC SPECIMEN COL (MISCELLANEOUS) IMPLANT
TUBE CONNECTING 20X1/4 (TUBING) ×2 IMPLANT
WATER STERILE IRR 1000ML POUR (IV SOLUTION) IMPLANT
YANKAUER SUCT BULB TIP NO VENT (SUCTIONS) ×2 IMPLANT

## 2011-03-31 NOTE — Progress Notes (Signed)
Mailed after appt letter to pt. 

## 2011-03-31 NOTE — Op Note (Signed)
Pre-Op Dx - left breast cancer with left breast abscess Post-Op Dx - Same Procedure - Incision and drainage of left breast abscess Triston Skare K. Local-MAC  Indications - 46 yo female with recently diagnosed 10.4 cm infiltrating ductal carcinoma of the left breast.  She presented with a large central abscess, likely from central necrosis of the cancer.  This was drained locally in the office, but has continued to drain purulent fluid.  She presents now for wide debridement of the wound with washout.  Description of  Procedure:  The patient was brought to the operating room and placed in a supine position on the operating room table.  Her left breast was prepped with betadine and draped in sterile fashion.  She was given intravenous sedation.  A time out was taken.  We anesthetized the area around the draining abscess with 0.25% Marcaine with epinephrine.  I excised some skin and necrotic tissue overlying a large abscess cavity.  We drained a moderate amount of purulent fluid.   The wound measures 2 x 3 x 4 cm deep, extending deep into the tumor.  There was significant oozing of the sides of the cavity.  We spent a considerable amount of time using cautery, Surgicel gauze, and direct pressure to obtain hemostasis.  The wound was thoroughly irrigated and hemostasis was felt to be adequate.  The surgicel was left deep in the wound.  The wound was then packed with approximately 12 inches of Kerlix gauze moistened with saline.  A sterile dressing was applied.  The patient was then awakened and brought to the recovery room in stable condition.  Wilmon Arms. Corliss Skains, MD, [facility] Surgery  03/31/2011 1:23 PM

## 2011-03-31 NOTE — Transfer of Care (Signed)
Immediate Anesthesia Transfer of Care Note  Patient: Alyssa May  Procedure(s) Performed:  INCISION AND DRAINAGE ABSCESS - incision and drainage left breast abscess  Patient Location: PACU  Anesthesia Type: MAC  Level of Consciousness: awake, alert , oriented and patient cooperative  Airway & Oxygen Therapy: Patient Spontanous Breathing and Patient connected to face mask oxygen  Post-op Assessment: Report given to PACU RN and Post -op Vital signs reviewed and stable  Post vital signs: Reviewed and stable  Complications: No apparent anesthesia complications

## 2011-03-31 NOTE — Anesthesia Preprocedure Evaluation (Signed)
Anesthesia Evaluation  Patient identified by MRN, date of birth, ID band Patient awake    Reviewed: Allergy & Precautions, H&P , NPO status , Patient's Chart, lab work & pertinent test results, reviewed documented beta blocker date and time   Airway Mallampati: II TM Distance: >3 FB Neck ROM: full    Dental   Pulmonary neg pulmonary ROS,          Cardiovascular neg cardio ROS     Neuro/Psych Negative Neurological ROS  Negative Psych ROS   GI/Hepatic negative GI ROS, Neg liver ROS,   Endo/Other  Negative Endocrine ROS  Renal/GU negative Renal ROS  Genitourinary negative   Musculoskeletal   Abdominal   Peds  Hematology negative hematology ROS (+)   Anesthesia Other Findings See surgeon's H&P   Reproductive/Obstetrics negative OB ROS                           Anesthesia Physical Anesthesia Plan  ASA: II  Anesthesia Plan: MAC   Post-op Pain Management:    Induction: Intravenous  Airway Management Planned: Simple Face Mask  Additional Equipment:   Intra-op Plan:   Post-operative Plan:   Informed Consent: I have reviewed the patients History and Physical, chart, labs and discussed the procedure including the risks, benefits and alternatives for the proposed anesthesia with the patient or authorized representative who has indicated his/her understanding and acceptance.     Plan Discussed with: CRNA and Surgeon  Anesthesia Plan Comments:         Anesthesia Quick Evaluation  

## 2011-03-31 NOTE — Anesthesia Postprocedure Evaluation (Signed)
Anesthesia Post Note  Patient: Alyssa May  Procedure(s) Performed:  INCISION AND DRAINAGE ABSCESS - incision and drainage left breast abscess  Anesthesia type: MAC  Patient location: PACU  Post pain: Pain level controlled  Post assessment: Patient's Cardiovascular Status Stable  Last Vitals:  Filed Vitals:   03/31/11 1415  BP: 105/64  Pulse: 72  Temp:   Resp: 13    Post vital signs: Reviewed and stable  Level of consciousness: alert  Complications: No apparent anesthesia complications

## 2011-03-31 NOTE — H&P (Signed)
HPI Alyssa May is a 46 y.o. female.  Referred by Dr. Rubin Payor at [facility] ED for left breast mass HPI This is a healthy 46 year old female who first noticed a pea-sized mass in her left breast in November 2011. She had recently stopped breast-feeding. This area has been enlarging over the last year. In July of this last year she estimates that it was golfball sized. It has begun to protrude through the skin and causing some discomfort. The patient had a miscarriage in October 2012. She does not have a local primary care physician or a gynecologist. She presented to the emergency department on 03/09/11 2 to the increased discomfort. She was noted to have a 4 cm area of fluctuance with some fibrinous extending up towards the axilla and medially in the breast towards the nipple. The cystic area was aspirated and yielded a large amount of serosanguineous fluid. The patient states that the fluid has reaccumulated. Past Medical History   Diagnosis  Date   .  Breast abscess           Past Surgical History   Procedure  Date   .  Dilation and curettage of uterus  01/04/11         Family History   Problem  Relation  Age of Onset   .  Diabetes  Mother     .  Hypertension  Mother          Social History History   Substance Use Topics   .  Smoking status:  Never Smoker    .  Smokeless tobacco:  Never Used   .  Alcohol Use:  Yes         occasional         Allergies   Allergen  Reactions   .  Sulfa Antibiotics  Swelling         Current Outpatient Prescriptions   Medication  Sig  Dispense  Refill   .  HYDROcodone-acetaminophen (VICODIN) 5-500 MG per tablet  Take 1 tablet by mouth every 4 (four) hours as needed for pain.   30 tablet   0        Review of Systems Review of Systems  Constitutional: Negative for fever, chills and unexpected weight change.  HENT: Negative for hearing loss, congestion, sore throat, trouble swallowing and voice change.   Eyes: Negative for  visual disturbance.  Respiratory: Negative for cough and wheezing.   Cardiovascular: Negative for chest pain, palpitations and leg swelling.  Gastrointestinal: Negative for nausea, vomiting, abdominal pain, diarrhea, constipation, blood in stool, abdominal distention and anal bleeding.  Genitourinary: Negative for hematuria, vaginal bleeding and difficulty urinating.  Musculoskeletal: Negative for arthralgias.  Skin: Negative for rash and wound.  Neurological: Negative for seizures, syncope and headaches.  Hematological: Negative for adenopathy. Does not bruise/bleed easily.  Psychiatric/Behavioral: Negative for confusion.  Menarche - age 56 First pregnancy - age 46 Breastfeed - yes LMP - July Miscarriage October 2012 - has not restarted regular periods since then   Blood pressure 118/70, pulse 68, temperature 96.9 F (36.1 C), temperature source Temporal, resp. rate 16, height 5\' 5"  (1.651 m), weight 191 lb (86.637 kg).    Physical Exam Breasts - right breast - no masses, no nipple discharge or inversion, no axillary lymphadenopathy Left breast - 4 cm fluctuant, protruding area in the upper outer quadrant with opening draining some purulent fluid.  Surrounding this fluctuance, there is a wider 8-10 cm area of  firmness extending towards the lower breast with some peau-d'orange skin changes. Data Reviewed None   Assessment  left breast cancer with central necrosis and abscess.     Plan    Will drain in OR today prior to placing port.      Wilmon Arms. Corliss Skains, MD, [facility] Surgery  03/31/2011 12:15 PM

## 2011-04-04 ENCOUNTER — Ambulatory Visit (HOSPITAL_COMMUNITY)
Admission: RE | Admit: 2011-04-04 | Discharge: 2011-04-04 | Disposition: A | Discharge status: Home / Self Care | Payer: Medicaid Other | Source: Ambulatory Visit | Attending: Oncology | Admitting: Oncology

## 2011-04-04 DIAGNOSIS — C50919 Malignant neoplasm of unspecified site of unspecified female breast: Secondary | ICD-10-CM | POA: Insufficient documentation

## 2011-04-04 DIAGNOSIS — I10 Essential (primary) hypertension: Secondary | ICD-10-CM | POA: Insufficient documentation

## 2011-04-04 DIAGNOSIS — C801 Malignant (primary) neoplasm, unspecified: Secondary | ICD-10-CM

## 2011-04-04 NOTE — Progress Notes (Signed)
  Echocardiogram 2D Echocardiogram has been performed.  Juanita Laster Kymberlie Brazeau 04/04/2011, 11:32 AM

## 2011-04-05 ENCOUNTER — Ambulatory Visit (HOSPITAL_COMMUNITY): Payer: Medicaid Other | Attending: Oncology

## 2011-04-06 ENCOUNTER — Encounter (INDEPENDENT_AMBULATORY_CARE_PROVIDER_SITE_OTHER): Payer: Self-pay | Admitting: Surgery

## 2011-04-07 ENCOUNTER — Inpatient Hospital Stay (HOSPITAL_COMMUNITY): Admission: RE | Admit: 2011-04-07 | Payer: No Typology Code available for payment source | Source: Ambulatory Visit

## 2011-04-07 ENCOUNTER — Ambulatory Visit (INDEPENDENT_AMBULATORY_CARE_PROVIDER_SITE_OTHER): Payer: PRIVATE HEALTH INSURANCE | Admitting: Surgery

## 2011-04-07 ENCOUNTER — Encounter (INDEPENDENT_AMBULATORY_CARE_PROVIDER_SITE_OTHER): Payer: Self-pay | Admitting: Surgery

## 2011-04-07 ENCOUNTER — Encounter (HOSPITAL_COMMUNITY): Payer: Self-pay | Admitting: Pharmacy Technician

## 2011-04-07 ENCOUNTER — Inpatient Hospital Stay (HOSPITAL_COMMUNITY)
Admission: RE | Admit: 2011-04-07 | Discharge: 2011-04-07 | Payer: No Typology Code available for payment source | Source: Ambulatory Visit | Attending: Oncology | Admitting: Oncology

## 2011-04-07 VITALS — BP 120/80 | HR 92 | Temp 98.4°F | Ht 65.0 in | Wt 191.0 lb

## 2011-04-07 DIAGNOSIS — C50419 Malignant neoplasm of upper-outer quadrant of unspecified female breast: Secondary | ICD-10-CM

## 2011-04-07 NOTE — Progress Notes (Signed)
The patient called this morning with some concern about bleeding from the wound.  We worked her into the office today.  She is still having some tenderness from the tumor.  The wound is open and has some granulation tissue.  There is some oozing from the wound and some foul-smelling drainage from the necrotic portion of the tumor.  I removed some of the surgicel that was placed at the time of surgery and replaced it with GelFoam and two saline-soaked 4x4 gauze sponges.  She will leave these in place until Saturday before resuming dressing changes.  This wound will likely be slow in healing due to the fact that it is in the middle of a large necrotic tumor.  We will plan her port placement next week.  Hopefully, as she receives chemo, the tumor will shrink and will heal more rapidly.  Wilmon Arms. Corliss Skains, MD, [facility] Surgery  04/07/2011 4:43 PM

## 2011-04-08 ENCOUNTER — Ambulatory Visit (HOSPITAL_BASED_OUTPATIENT_CLINIC_OR_DEPARTMENT_OTHER): Payer: PRIVATE HEALTH INSURANCE | Admitting: Oncology

## 2011-04-08 ENCOUNTER — Ambulatory Visit: Payer: No Typology Code available for payment source | Admitting: Oncology

## 2011-04-08 ENCOUNTER — Encounter (HOSPITAL_COMMUNITY): Payer: Self-pay

## 2011-04-08 ENCOUNTER — Encounter (HOSPITAL_COMMUNITY)
Admission: RE | Admit: 2011-04-08 | Discharge: 2011-04-08 | Disposition: A | Discharge status: Home / Self Care | Payer: Medicaid Other | Source: Ambulatory Visit | Attending: Surgery | Admitting: Surgery

## 2011-04-08 ENCOUNTER — Telehealth: Payer: Self-pay | Admitting: Oncology

## 2011-04-08 VITALS — BP 128/81 | HR 88 | Temp 98.5°F | Ht 65.0 in | Wt 190.0 lb

## 2011-04-08 DIAGNOSIS — C50419 Malignant neoplasm of upper-outer quadrant of unspecified female breast: Secondary | ICD-10-CM

## 2011-04-08 LAB — CBC
HCT: 27.8 % — ABNORMAL LOW (ref 36.0–46.0)
Hemoglobin: 8 g/dL — ABNORMAL LOW (ref 12.0–15.0)
MCH: 19.4 pg — ABNORMAL LOW (ref 26.0–34.0)
MCHC: 28.8 g/dL — ABNORMAL LOW (ref 30.0–36.0)
MCV: 67.5 fL — ABNORMAL LOW (ref 78.0–100.0)
Platelets: 1048 K/uL (ref 150–400)
RBC: 4.12 MIL/uL (ref 3.87–5.11)
RDW: 20.8 % — ABNORMAL HIGH (ref 11.5–15.5)
WBC: 6.2 K/uL (ref 4.0–10.5)

## 2011-04-08 LAB — BASIC METABOLIC PANEL WITH GFR
BUN: 7 mg/dL (ref 6–23)
CO2: 28 meq/L (ref 19–32)
Calcium: 8.8 mg/dL (ref 8.4–10.5)
Chloride: 101 meq/L (ref 96–112)
Creatinine, Ser: 0.81 mg/dL (ref 0.50–1.10)
GFR calc Af Amer: >90 mL/min
GFR calc non Af Amer: 88 mL/min — ABNORMAL LOW
Glucose, Bld: 93 mg/dL (ref 70–99)
Potassium: 3.6 meq/L (ref 3.5–5.1)
Sodium: 137 meq/L (ref 135–145)

## 2011-04-08 LAB — SURGICAL PCR SCREEN
MRSA, PCR: NEGATIVE
Staphylococcus aureus: POSITIVE — AB

## 2011-04-08 MED ORDER — TOBRAMYCIN 0.3 % OP SOLN: 1.0000 [drp] | Freq: Two times a day (BID) | OPHTHALMIC | Status: AC

## 2011-04-08 MED ORDER — LIDOCAINE-PRILOCAINE 2.5-2.5 % EX CREA: TOPICAL_CREAM | CUTANEOUS | Status: DC

## 2011-04-08 NOTE — Telephone Encounter (Signed)
S/w the pt's husband and he is aware of the feb 2013 appt. Pt will pick up her appt calendar at that time

## 2011-04-08 NOTE — Pre-Procedure Instructions (Signed)
04/08/11 Critical lab value sticker placed on progress note in chart.

## 2011-04-08 NOTE — Pre-Procedure Instructions (Signed)
04/08/11 lab called with Critical lab Value.  Called Dr Corliss Skains and made him aware of pltc count 1048 and hgb 8.0.  Also informed him that nurse did BMET on 04/08/11 and BMET was improved form 03/29/11 with a Sodium of 137.  No new orders given.

## 2011-04-08 NOTE — Progress Notes (Signed)
ID: MONTRICE MONTUORI  DOB: Jun 30, 1965  MR#: [id number]  GEX#:[id number]  HPI: Alyssa May is a 46 year-old [place] woman who first noted a small mass in her Left breast November 2011, shortly after she stopped breast-feeding. It did not disappear with resumption of her periods and grew over the next year, and particularly since she had her miscarrriage October 2012. This brought her to the Emergency Room 03/09/2011 where she was found to have a fluctuant area in the UOQ of the Left breast measuring 8.4 cm. The tissue beneath the cystic area extending towards the axilla and medially was described as firm, but not erythematous. The cystic area was lanced and "a large amount of serosanguinos material" issued. The patient was referred to Dr. Marcille Blanco who saw her 03/11/2011. By this time the fluid had largely reaccumulated.[name] He incised the lesion, again draining serosanguinous liquid, packed it, and set the patient up for [facility] breast [facility] 03/14/2011. This showed an irregularly marginated mass measuring 7.3 cm, with abnormal axillary adenopathy. On 01/1102013 bilateral mammography confirmed a high-density mass in the Left UOQ. This was biopsied, as was a Left axillary lymph node. [place] biopsies ([id number]) showed invasive ductal carcinoma, high grade, triple negative, with an Mib-1 of 97%.  Interval history:  Alyssa May returns today for followup of her breast cancer. She has had to have some appointments rescheduled because her husband's grandfather died and had to go to [place]. But she is getting a port placed on February 5 and her scans on February 15.  She did come to "chemotherapy school". She found that is very sore uninformative. She also had an echocardiogram which showed a well preserved ejection fraction.   ROS:  She continues to pack her wound. It smells a little. There is no significant pain. There has been no fever. She had there has been no unusual bleeding. She has had no  unusual headaches, visual changes, cough, phlegm production, pleurisy, shortness of breath, or change in bowel or bladder habits. A detailed review of systems was noncontributory.  Allergies  Allergen Reactions  . Sulfa Antibiotics Swelling    Current Outpatient Prescriptions  Medication Sig Dispense Refill  . Iodine, Kelp, (KELP PO) Take 2 tablets by mouth 2 (two) times daily.      . IRON PO Take 1 tablet by mouth daily.      [name] OVER THE COUNTER MEDICATION Take 1 tablet by mouth daily. Laminine supplement      . OVER THE COUNTER MEDICATION Take 1 tablet by mouth daily. Proactive Enzymes      . oxyCODONE-acetaminophen (PERCOCET) 5-325 MG per tablet Take 1 tablet by mouth every 4 (four) hours as needed for pain.  40 tablet  0  . vitamin C (ASCORBIC ACID) 500 MG tablet Take 500 mg by mouth daily.       PMHx: s/p T&A; s/p D&C October 20   Family [id number] father died from post-operative complications at age 27; the patient's mother is 64, lives in [place] The patient is a single child  GYN Hx: GX P1, first pregnancy to term age 28. 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 Area. Her husband [name] is a Hospital doctor for a Acupuncturist in [place]. Their daughter, [name], is 3. The family stays part-time with the patient's mother in [place]  Objective: a young-appearing African-American woman  Filed Vitals:  04/08/11 1048  BP: 128/81  Pulse: 88  Temp: 98.5 F (36.9 C)    BMI: Body mass index is 31.62 kg/(m^2).   ECOG FS: 0  Physical Exam:   Sclerae unicteric  Oropharynx clear  No peripheral adenopathy  Lungs clear -- no rales or rhonchi  Heart regular rate and rhythm  Abdomen benign  MSK no focal spinal tenderness, no peripheral edema  Neuro nonfocal  Breast exam: Not repeated today  Lab Results:      Chemistry      Component Value Date/Time   NA 131* 03/29/2011 1606       Component Value Date/Time   CALCIUM 9.2 03/29/2011 1606       Lab Results  Component Value Date   WBC 7.3 03/29/2011    Studies/Results: Echo 04/04/2011 showed a well preserved ejection fraction in the 55-60% range, with normal wall motion.  Assessment: 46 year-old [name] s/p Left breast biopsy 03/18/2011 showing a clinical T3-4, N1, Stage III invasive ductal carcinoma, high grade, triple negative, with an Mib-1 of 97%   Plan: As noted above she will have her port placed next week, but her staging studies had to be postponed because she was out of town in [place]. Accordingly we're not starting chemotherapy on tail favor a 19th. We discussed 4 dose dense a.c. +4 dose dense T. versus TAC every 3 weeks x6, and because the latter requires only 12 visit here as opposed to 16 with the earlier of form of treatment, she opted for the every 3 week treatment. She understands we do not have data comparing one of these treatment with the other and they are equally standard of care.  Today we spent the larger part of the visit discussing toxicities side effects and complications of chemotherapy I went ahead and wrote her prescriptions for lorazepam 0.5 mg prochlorperazine 10 mg ondansetron 8 mg EMLA green, and dexamethasone. I gave her the schedule in writing on how to take these medications. She is going to return on February 19 for her first dose she will see my physician's assistant on the same day. They will review the medications again and they will review the results of her PET scan and CT scan obtained a few days prior.  The oral plan is to complete 4 cycles of TAC. We will follow the breast exam to assess for response. At the end of treatment she will go for definitive surgery to be followed by radiation.   Hadja Harral C 04/08/2011

## 2011-04-08 NOTE — Patient Instructions (Signed)
Alyssa May  04/08/2011   Your procedure is scheduled on:  04/12/11 1000-1107am  Report to [facility] at 0800 AM.  Call this number if you have problems the morning of surgery: [phone]   Remember:   Do not eat food:After Midnight.  May have clear liquids:until Midnight .    Take these medicines the morning of surgery with A SIP OF WATER:    Do not wear jewelry, make-up or nail polish.  Do not wear lotions, powders, or perfumes.  Do not shave 48 hours prior to surgery.  Do not bring valuables to the hospital.  Contacts, dentures or bridgework may not be worn into surgery.  .   Patients discharged the day of surgery will not be allowed to drive home.  Name and phone number of your driver:   Special Instructions: CHG Shower Use Special Wash: 1/2 bottle night before surgery and 1/2 bottle morning of surgery.shower chin to toes with CHG.  Wash face and private parts with regular soap.     Please read over the following fact sheets that you were given: MRSA Information, coughing and deep breathing exercises, leg exercises

## 2011-04-11 ENCOUNTER — Ambulatory Visit: Payer: PRIVATE HEALTH INSURANCE | Admitting: Oncology

## 2011-04-12 ENCOUNTER — Encounter (HOSPITAL_COMMUNITY): Payer: Self-pay | Admitting: Certified Registered Nurse Anesthetist

## 2011-04-12 ENCOUNTER — Ambulatory Visit (HOSPITAL_COMMUNITY): Payer: Medicaid Other

## 2011-04-12 ENCOUNTER — Ambulatory Visit: Admit: 2011-04-12 | Payer: Self-pay | Admitting: Surgery

## 2011-04-12 ENCOUNTER — Ambulatory Visit (HOSPITAL_COMMUNITY): Payer: Medicaid Other | Admitting: Certified Registered Nurse Anesthetist

## 2011-04-12 ENCOUNTER — Encounter (HOSPITAL_COMMUNITY): Payer: Self-pay | Admitting: *Deleted

## 2011-04-12 ENCOUNTER — Ambulatory Visit (HOSPITAL_COMMUNITY): Payer: Medicaid Other | Admitting: Anesthesiology

## 2011-04-12 ENCOUNTER — Encounter (HOSPITAL_COMMUNITY): Payer: Self-pay | Admitting: Anesthesiology

## 2011-04-12 ENCOUNTER — Encounter (HOSPITAL_COMMUNITY)
Admission: RE | Disposition: A | Discharge status: Home / Self Care | Payer: Self-pay | Source: Ambulatory Visit | Attending: Surgery

## 2011-04-12 ENCOUNTER — Encounter (HOSPITAL_COMMUNITY): Payer: Self-pay

## 2011-04-12 ENCOUNTER — Ambulatory Visit (HOSPITAL_COMMUNITY)
Admission: RE | Admit: 2011-04-12 | Discharge: 2011-04-12 | Disposition: A | Discharge status: Home / Self Care | Payer: Medicaid Other | Source: Ambulatory Visit | Attending: Surgery | Admitting: Surgery

## 2011-04-12 DIAGNOSIS — C50919 Malignant neoplasm of unspecified site of unspecified female breast: Secondary | ICD-10-CM | POA: Insufficient documentation

## 2011-04-12 DIAGNOSIS — Y849 Medical procedure, unspecified as the cause of abnormal reaction of the patient, or of later complication, without mention of misadventure at the time of the procedure: Secondary | ICD-10-CM | POA: Insufficient documentation

## 2011-04-12 DIAGNOSIS — Z452 Encounter for adjustment and management of vascular access device: Secondary | ICD-10-CM | POA: Insufficient documentation

## 2011-04-12 DIAGNOSIS — Z01812 Encounter for preprocedural laboratory examination: Secondary | ICD-10-CM | POA: Insufficient documentation

## 2011-04-12 DIAGNOSIS — T82598A Other mechanical complication of other cardiac and vascular devices and implants, initial encounter: Secondary | ICD-10-CM | POA: Insufficient documentation

## 2011-04-12 HISTORY — PX: PORTACATH PLACEMENT: SHX2246

## 2011-04-12 SURGERY — REVISION, PORT-A-CATH INSERTION
Anesthesia: Monitor Anesthesia Care | Site: Chest | Laterality: Right | Wound class: Clean

## 2011-04-12 SURGERY — INSERTION, TUNNELED CENTRAL VENOUS DEVICE, WITH PORT
Anesthesia: General | Site: Chest | Laterality: Right | Wound class: Clean

## 2011-04-12 MED ORDER — OXYCODONE-ACETAMINOPHEN 5-325 MG PO TABS: 1.0000 | ORAL_TABLET | ORAL | Status: AC | PRN

## 2011-04-12 MED ORDER — FENTANYL CITRATE 0.05 MG/ML IJ SOLN
INTRAMUSCULAR | Status: DC | PRN
  Administered 2011-04-12 (×2): 50 ug via INTRAVENOUS

## 2011-04-12 MED ORDER — FENTANYL CITRATE 0.05 MG/ML IJ SOLN
50.0000 ug | INTRAMUSCULAR | Status: DC | PRN
  Administered 2011-04-12: 50 ug via INTRAVENOUS

## 2011-04-12 MED ORDER — CEFAZOLIN SODIUM-DEXTROSE 2-3 GM-% IV SOLR
2.0000 g | INTRAVENOUS | Status: AC
  Administered 2011-04-12: 2 g via INTRAVENOUS

## 2011-04-12 MED ORDER — CEFAZOLIN SODIUM 1-5 GM-% IV SOLN
INTRAVENOUS | Status: AC
  Filled 2011-04-12: qty 50

## 2011-04-12 MED ORDER — FENTANYL CITRATE 0.05 MG/ML IJ SOLN
INTRAMUSCULAR | Status: DC | PRN
  Administered 2011-04-12: 25 ug via INTRAVENOUS
  Administered 2011-04-12 (×3): 50 ug via INTRAVENOUS
  Administered 2011-04-12: 25 ug via INTRAVENOUS
  Administered 2011-04-12: 50 ug via INTRAVENOUS

## 2011-04-12 MED ORDER — HEPARIN SOD (PORK) LOCK FLUSH 100 UNIT/ML IV SOLN
INTRAVENOUS | Status: AC
  Filled 2011-04-12: qty 10

## 2011-04-12 MED ORDER — ONDANSETRON HCL 4 MG/2ML IJ SOLN
INTRAMUSCULAR | Status: DC | PRN
  Administered 2011-04-12: 4 mg via INTRAVENOUS

## 2011-04-12 MED ORDER — PROPOFOL 10 MG/ML IV EMUL
INTRAVENOUS | Status: DC | PRN
  Administered 2011-04-12: 100 mg via INTRAVENOUS
  Administered 2011-04-12: 200 mg via INTRAVENOUS

## 2011-04-12 MED ORDER — PROPOFOL 10 MG/ML IV EMUL
INTRAVENOUS | Status: DC | PRN
  Administered 2011-04-12: 100 ug/kg/min via INTRAVENOUS

## 2011-04-12 MED ORDER — BUPIVACAINE HCL 0.25 % IJ SOLN
INTRAMUSCULAR | Status: DC | PRN
  Administered 2011-04-12: 6 mL

## 2011-04-12 MED ORDER — HEPARIN SOD (PORK) LOCK FLUSH 100 UNIT/ML IV SOLN
INTRAVENOUS | Status: DC | PRN
  Administered 2011-04-12: 500 [IU] via INTRAVENOUS

## 2011-04-12 MED ORDER — PROMETHAZINE HCL 25 MG/ML IJ SOLN: 6.2500 mg | INTRAMUSCULAR | Status: DC | PRN

## 2011-04-12 MED ORDER — LIDOCAINE HCL (CARDIAC) 20 MG/ML IV SOLN
INTRAVENOUS | Status: DC | PRN
  Administered 2011-04-12: 75 mg via INTRAVENOUS

## 2011-04-12 MED ORDER — LACTATED RINGERS IV SOLN
INTRAVENOUS | Status: DC | PRN
  Administered 2011-04-12: 09:00:00 via INTRAVENOUS

## 2011-04-12 MED ORDER — ACETAMINOPHEN 10 MG/ML IV SOLN
INTRAVENOUS | Status: AC
  Filled 2011-04-12: qty 100

## 2011-04-12 MED ORDER — MIDAZOLAM HCL 5 MG/5ML IJ SOLN
INTRAMUSCULAR | Status: DC | PRN
  Administered 2011-04-12 (×2): 1 mg via INTRAVENOUS

## 2011-04-12 MED ORDER — LIDOCAINE HCL (CARDIAC) 20 MG/ML IV SOLN
INTRAVENOUS | Status: DC | PRN
  Administered 2011-04-12: 100 mg via INTRAVENOUS

## 2011-04-12 MED ORDER — HYDROMORPHONE HCL PF 1 MG/ML IJ SOLN: 0.2500 mg | INTRAMUSCULAR | Status: DC | PRN

## 2011-04-12 MED ORDER — MIDAZOLAM HCL 5 MG/5ML IJ SOLN
INTRAMUSCULAR | Status: DC | PRN
  Administered 2011-04-12: 2 mg via INTRAVENOUS

## 2011-04-12 MED ORDER — LACTATED RINGERS IV SOLN
INTRAVENOUS | Status: DC
  Administered 2011-04-12: 11:00:00 via INTRAVENOUS

## 2011-04-12 MED ORDER — DEXAMETHASONE SODIUM PHOSPHATE 4 MG/ML IJ SOLN
INTRAMUSCULAR | Status: DC | PRN
  Administered 2011-04-12: 10 mg via INTRAVENOUS

## 2011-04-12 MED ORDER — HEPARIN 6000 UNITS/NORMAL SALINE 500 ML OPTIME IRRIGATION
Status: DC | PRN
  Administered 2011-04-12: 1

## 2011-04-12 MED ORDER — BUPIVACAINE-EPINEPHRINE 0.25% -1:200000 IJ SOLN
INTRAMUSCULAR | Status: AC
  Filled 2011-04-12: qty 1

## 2011-04-12 MED ORDER — LACTATED RINGERS IV SOLN
INTRAVENOUS | Status: DC | PRN
  Administered 2011-04-12: 16:00:00 via INTRAVENOUS

## 2011-04-12 MED ORDER — HYDROMORPHONE HCL PF 1 MG/ML IJ SOLN
INTRAMUSCULAR | Status: AC
  Filled 2011-04-12: qty 1

## 2011-04-12 MED ORDER — HEPARIN SOD (PORK) LOCK FLUSH 100 UNIT/ML IV SOLN
INTRAVENOUS | Status: AC
  Filled 2011-04-12: qty 5

## 2011-04-12 MED ORDER — FENTANYL CITRATE 0.05 MG/ML IJ SOLN
INTRAMUSCULAR | Status: AC
  Filled 2011-04-12: qty 2

## 2011-04-12 MED ORDER — BUPIVACAINE-EPINEPHRINE PF 0.25-1:200000 % IJ SOLN
INTRAMUSCULAR | Status: AC
  Filled 2011-04-12: qty 30

## 2011-04-12 MED ORDER — SODIUM CHLORIDE 0.9 % IV SOLN: INTRAVENOUS | Status: DC

## 2011-04-12 MED ORDER — ACETAMINOPHEN 10 MG/ML IV SOLN
INTRAVENOUS | Status: DC | PRN
  Administered 2011-04-12: 1000 mg via INTRAVENOUS

## 2011-04-12 MED ORDER — CEFAZOLIN SODIUM-DEXTROSE 2-3 GM-% IV SOLR
INTRAVENOUS | Status: AC
  Filled 2011-04-12: qty 50

## 2011-04-12 MED ORDER — HEPARIN SODIUM (PORCINE) 1000 UNIT/ML IJ SOLN
Freq: Once | INTRAMUSCULAR | Status: DC
  Filled 2011-04-12: qty 500

## 2011-04-12 MED ORDER — BUPIVACAINE-EPINEPHRINE PF 0.25-1:200000 % IJ SOLN
INTRAMUSCULAR | Status: DC | PRN
  Administered 2011-04-12: 9 mL

## 2011-04-12 SURGICAL SUPPLY — 50 items
BAG DECANTER FOR FLEXI CONT (MISCELLANEOUS) ×3 IMPLANT
BENZOIN TINCTURE PRP APPL 2/3 (GAUZE/BANDAGES/DRESSINGS) ×3 IMPLANT
BLADE HEX COATED 2.75 (ELECTRODE) ×3 IMPLANT
BLADE SURG 15 STRL LF DISP TIS (BLADE) ×2 IMPLANT
BLADE SURG 15 STRL SS (BLADE) ×1
BLADE SURG SZ11 CARB STEEL (BLADE) ×3 IMPLANT
CLOSURE STERI STRIP 1/2 X4 (GAUZE/BANDAGES/DRESSINGS) ×3 IMPLANT
CLOTH BEACON ORANGE TIMEOUT ST (SAFETY) ×3 IMPLANT
COVER SURGICAL LIGHT HANDLE (MISCELLANEOUS) ×3 IMPLANT
DRAPE C-ARM 42X72 X-RAY (DRAPES) IMPLANT
DRAPE LAPAROSCOPIC ABDOMINAL (DRAPES) ×3 IMPLANT
DRAPE UTILITY XL STRL (DRAPES) ×3 IMPLANT
DRSG PAD ABDOMINAL 8X10 ST (GAUZE/BANDAGES/DRESSINGS) ×3 IMPLANT
DRSG TEGADERM 2-3/8X2-3/4 SM (GAUZE/BANDAGES/DRESSINGS) IMPLANT
DRSG TEGADERM 4X4.75 (GAUZE/BANDAGES/DRESSINGS) IMPLANT
ELECT REM PT RETURN 9FT ADLT (ELECTROSURGICAL) ×3
ELECTRODE REM PT RTRN 9FT ADLT (ELECTROSURGICAL) ×2 IMPLANT
GAUZE SPONGE 2X2 8PLY STRL LF (GAUZE/BANDAGES/DRESSINGS) IMPLANT
GAUZE SPONGE 4X4 12PLY STRL LF (GAUZE/BANDAGES/DRESSINGS) ×3 IMPLANT
GAUZE SPONGE 4X4 16PLY XRAY LF (GAUZE/BANDAGES/DRESSINGS) ×3 IMPLANT
GLOVE BIO SURGEON STRL SZ7 (GLOVE) ×3 IMPLANT
GLOVE BIOGEL PI IND STRL 7.0 (GLOVE) ×2 IMPLANT
GLOVE BIOGEL PI IND STRL 7.5 (GLOVE) ×2 IMPLANT
GLOVE BIOGEL PI INDICATOR 7.0 (GLOVE) ×1
GLOVE BIOGEL PI INDICATOR 7.5 (GLOVE) ×1
GOWN STRL NON-REIN LRG LVL3 (GOWN DISPOSABLE) ×6 IMPLANT
GOWN STRL REIN XL XLG (GOWN DISPOSABLE) ×3 IMPLANT
KIT BASIN OR (CUSTOM PROCEDURE TRAY) ×3 IMPLANT
KIT POWER CATH 8FR (Catheter) IMPLANT
NEEDLE HYPO 22GX1.5 SAFETY (NEEDLE) ×3 IMPLANT
NEEDLE HYPO 25X1 1.5 SAFETY (NEEDLE) ×3 IMPLANT
NS IRRIG 1000ML POUR BTL (IV SOLUTION) ×3 IMPLANT
PACK BASIC VI WITH GOWN DISP (CUSTOM PROCEDURE TRAY) ×3 IMPLANT
PENCIL BUTTON HOLSTER BLD 10FT (ELECTRODE) ×3 IMPLANT
SPONGE GAUZE 2X2 STER 10/PKG (GAUZE/BANDAGES/DRESSINGS)
SPONGE GAUZE 4X4 12PLY (GAUZE/BANDAGES/DRESSINGS) IMPLANT
STRIP CLOSURE SKIN 1/2X4 (GAUZE/BANDAGES/DRESSINGS) ×3 IMPLANT
SUT MNCRL AB 4-0 PS2 18 (SUTURE) ×3 IMPLANT
SUT PROLENE 2 0 SH DA (SUTURE) ×6 IMPLANT
SUT SILK 2 0 (SUTURE)
SUT SILK 2-0 30XBRD TIE 12 (SUTURE) IMPLANT
SUT VIC AB 3-0 SH 27 (SUTURE) ×1
SUT VIC AB 3-0 SH 27X BRD (SUTURE) ×2 IMPLANT
SUT VIC AB 3-0 SH 27XBRD (SUTURE) IMPLANT
SYR 30ML LL (SYRINGE) ×3 IMPLANT
SYR BULB IRRIGATION 50ML (SYRINGE) IMPLANT
SYR CONTROL 10ML LL (SYRINGE) ×3 IMPLANT
SYRINGE 10CC LL (SYRINGE) IMPLANT
TOWEL OR 17X26 10 PK STRL BLUE (TOWEL DISPOSABLE) ×3 IMPLANT
WATER STERILE IRR 1500ML POUR (IV SOLUTION) IMPLANT

## 2011-04-12 SURGICAL SUPPLY — 46 items
BAG DECANTER FOR FLEXI CONT (MISCELLANEOUS) ×2 IMPLANT
BENZOIN TINCTURE PRP APPL 2/3 (GAUZE/BANDAGES/DRESSINGS) ×2 IMPLANT
BLADE HEX COATED 2.75 (ELECTRODE) ×2 IMPLANT
BLADE SURG 15 STRL LF DISP TIS (BLADE) ×1 IMPLANT
BLADE SURG 15 STRL SS (BLADE) ×1
BLADE SURG SZ11 CARB STEEL (BLADE) ×2 IMPLANT
CLOTH BEACON ORANGE TIMEOUT ST (SAFETY) ×2 IMPLANT
COVER SURGICAL LIGHT HANDLE (MISCELLANEOUS) ×2 IMPLANT
DRAPE C-ARM 42X72 X-RAY (DRAPES) ×2 IMPLANT
DRAPE LAPAROSCOPIC ABDOMINAL (DRAPES) ×2 IMPLANT
DRAPE UTILITY XL STRL (DRAPES) ×2 IMPLANT
DRSG TEGADERM 2-3/8X2-3/4 SM (GAUZE/BANDAGES/DRESSINGS) IMPLANT
DRSG TEGADERM 4X4.75 (GAUZE/BANDAGES/DRESSINGS) ×4 IMPLANT
ELECT REM PT RETURN 9FT ADLT (ELECTROSURGICAL) ×2
ELECTRODE REM PT RTRN 9FT ADLT (ELECTROSURGICAL) ×1 IMPLANT
GAUZE SPONGE 2X2 8PLY STRL LF (GAUZE/BANDAGES/DRESSINGS) IMPLANT
GAUZE SPONGE 4X4 12PLY STRL LF (GAUZE/BANDAGES/DRESSINGS) ×2 IMPLANT
GAUZE SPONGE 4X4 16PLY XRAY LF (GAUZE/BANDAGES/DRESSINGS) ×2 IMPLANT
GLOVE BIO SURGEON STRL SZ7 (GLOVE) ×2 IMPLANT
GLOVE BIOGEL PI IND STRL 7.0 (GLOVE) ×1 IMPLANT
GLOVE BIOGEL PI IND STRL 7.5 (GLOVE) ×1 IMPLANT
GLOVE BIOGEL PI INDICATOR 7.0 (GLOVE) ×1
GLOVE BIOGEL PI INDICATOR 7.5 (GLOVE) ×1
GOWN STRL NON-REIN LRG LVL3 (GOWN DISPOSABLE) ×4 IMPLANT
GOWN STRL REIN XL XLG (GOWN DISPOSABLE) ×2 IMPLANT
KIT BASIN OR (CUSTOM PROCEDURE TRAY) ×2 IMPLANT
KIT POWER CATH 8FR (Catheter) ×2 IMPLANT
NEEDLE HYPO 25X1 1.5 SAFETY (NEEDLE) ×2 IMPLANT
NS IRRIG 1000ML POUR BTL (IV SOLUTION) ×2 IMPLANT
PACK BASIC VI WITH GOWN DISP (CUSTOM PROCEDURE TRAY) ×2 IMPLANT
PENCIL BUTTON HOLSTER BLD 10FT (ELECTRODE) ×2 IMPLANT
SPONGE GAUZE 2X2 STER 10/PKG (GAUZE/BANDAGES/DRESSINGS)
SPONGE GAUZE 4X4 12PLY (GAUZE/BANDAGES/DRESSINGS) ×4 IMPLANT
STRIP CLOSURE SKIN 1/2X4 (GAUZE/BANDAGES/DRESSINGS) ×2 IMPLANT
SUT MNCRL AB 4-0 PS2 18 (SUTURE) ×2 IMPLANT
SUT PROLENE 2 0 SH DA (SUTURE) ×4 IMPLANT
SUT SILK 2 0 (SUTURE)
SUT SILK 2-0 30XBRD TIE 12 (SUTURE) IMPLANT
SUT VIC AB 3-0 SH 27 (SUTURE) ×1
SUT VIC AB 3-0 SH 27X BRD (SUTURE) ×1 IMPLANT
SUT VIC AB 3-0 SH 27XBRD (SUTURE) IMPLANT
SYR BULB IRRIGATION 50ML (SYRINGE) IMPLANT
SYR CONTROL 10ML LL (SYRINGE) ×2 IMPLANT
SYRINGE 10CC LL (SYRINGE) ×2 IMPLANT
TOWEL OR 17X26 10 PK STRL BLUE (TOWEL DISPOSABLE) ×2 IMPLANT
WATER STERILE IRR 1500ML POUR (IV SOLUTION) IMPLANT

## 2011-04-12 NOTE — Interval H&P Note (Signed)
History and Physical Interval Note:  04/12/2011 12:33 PM  [name]  Had a port place this morning.  The tip seems to be in the right atrium, so this needs to be revised so the tip is in the cava.  Plan port revision today.  Nikolus Marczak K.

## 2011-04-12 NOTE — Op Note (Signed)
Pre-op Dx:  Malpositioned port Post-op Dx - same Procedure - Port revision Deante Blough K. Local-MAC Indications - the patient had a port placed this morning, but the final CXR showed that the tip of the catheter was about 3-4 cm into the right atrium.  She comes back to the OR this evening for port revision.  Procedure - The patient was brought to the operating room and placed in a supine position on the operating room table.  She was given some intravenous sedation and her right chest was prepped with chloraprep and draped in sterile fashion.  The area around her port was infiltrated with 0.25% Marcaine.  We opened the old incision and exposed the port.  I removed the sutures holding the port in place.  I backed the port up about 6 cm, clamped the catheter, removed the catheter from the port, amputated about 4.5 cm of catheter, and then reattached the catheter to the port.  The port was sutured to the fascia with 2-0 Prolene.  The port aspirated blood easily and flushed easily.  The wound was closed with 3-0 Vicryl and 4-0 Monocryl.  Benzoin and steri-strips were applied.  The patient was awakened and brought to the recovery room in stable condition.  Wilmon Arms. Corliss Skains, MD, [facility] Surgery  04/12/2011 5:03 PM

## 2011-04-12 NOTE — Progress Notes (Signed)
Dr Corliss Skains reviewed chest xray while in pacu.  Ok to transfer patient to short stay.  Do not have to wait for written report

## 2011-04-12 NOTE — Anesthesia Preprocedure Evaluation (Signed)
Anesthesia Evaluation  Patient identified by MRN, date of birth, ID band Patient awake  General Assessment Comment:Revision Port-a-cath  Reviewed: Allergy & Precautions, H&P , NPO status , Patient's Chart, lab work & pertinent test results, reviewed documented beta blocker date and time   Airway   Neck ROM: Full    Dental  (+) Teeth Intact   Pulmonary neg pulmonary ROS,  clear to auscultation        Cardiovascular neg cardio ROS Regular  Denies cardiac symptoms   Neuro/Psych Negative Neurological ROS  Negative Psych ROS   GI/Hepatic negative GI ROS, Neg liver ROS,   Endo/Other  Negative Endocrine ROS  Renal/GU negative Renal ROS  Genitourinary negative   Musculoskeletal negative musculoskeletal ROS (+)   Abdominal   Peds negative pediatric ROS (+)  Hematology Anemia Hgb 8.0   Anesthesia Other Findings   Reproductive/Obstetrics Breast Cancer                           Anesthesia Physical Anesthesia Plan  ASA: II  Anesthesia Plan: MAC   Post-op Pain Management:    Induction: Intravenous  Airway Management Planned: Mask  Additional Equipment:   Intra-op Plan:   Post-operative Plan:   Informed Consent: I have reviewed the patients History and Physical, chart, labs and discussed the procedure including the risks, benefits and alternatives for the proposed anesthesia with the patient or authorized representative who has indicated his/her understanding and acceptance.     Plan Discussed with: CRNA and Surgeon  Anesthesia Plan Comments:         Anesthesia Quick Evaluation

## 2011-04-12 NOTE — Progress Notes (Addendum)
Dr Corliss Skains has been into see pt and new consent for port revision signed. Pt kept NPO

## 2011-04-12 NOTE — Transfer of Care (Signed)
Immediate Anesthesia Transfer of Care Note  Patient: Alyssa May  Procedure(s) Performed:  INSERTION PORT-A-CATH - right subclavian  Patient Location: PACU  Anesthesia Type: General  Level of Consciousness: sedated, patient cooperative and responds to stimulaton  Airway & Oxygen Therapy: Patient Spontanous Breathing and Patient connected to face mask oxgen  Post-op Assessment: Report given to PACU RN and Post -op Vital signs reviewed and stable  Post vital signs: Reviewed and stable  Complications: No apparent anesthesia complications

## 2011-04-12 NOTE — Anesthesia Postprocedure Evaluation (Signed)
  Anesthesia Post-op Note  Patient: Alyssa May  Procedure(s) Performed:  INSERTION PORT-A-CATH - right subclavian  Patient Location: PACU  Anesthesia Type: General  Level of Consciousness: oriented and sedated  Airway and Oxygen Therapy: Patient Spontanous Breathing and Patient connected to nasal cannula oxygen  Post-op Pain: mild  Post-op Assessment: Post-op Vital signs reviewed, Patient's Cardiovascular Status Stable, Respiratory Function Stable and Patent Airway  Post-op Vital Signs: stable  Complications: No apparent anesthesia complications

## 2011-04-12 NOTE — Transfer of Care (Signed)
Immediate Anesthesia Transfer of Care Note  Patient: Alyssa May  Procedure(s) Performed:  INSERTION PORT-A-CATH - Port-a-cath revision  Patient Location: PACU  Anesthesia Type: MAC  Level of Consciousness: sedated, patient cooperative and responds to stimulaton  Airway & Oxygen Therapy: Patient Spontanous Breathing and Patient connected to face mask oxgen  Post-op Assessment: Report given to PACU RN and Post -op Vital signs reviewed and stable  Post vital signs: Reviewed and stable  Complications: No apparent anesthesia complications

## 2011-04-12 NOTE — Op Note (Signed)
Pre-op diagnosis:  Left breast cancer Post-Op diagnosis:  Same Procedure:  Right subclavian vein port placement Sailor Haughn K. Anesthesia:  Gen-LMA Indications:  46 yo female recently diagnosed with locally advanced left breast cancer with metastatic axillary lymph nodes presents for port placement for chemotherapy.  Procedure:  The patient was placed in a supine position on the operating room table with her arms tucked.  A roll was placed behind her shoulders.  After an adequate level of anesthesia was obtained, her chest and neck were prepped with chloraprep and draped in sterile fashion.  She was placed in Trendelenberg position and an 18 gauge needle was used to cannulate the subclavian vein.  Good blood return was noted.  The guidewire was advanced through the needle and the needle was removed.  Fluoroscopy confirmed that the wire was heading down the superior vena cava.  We then made a 3 cm incision below the insertion site and created a subcutaneous pocket.  An 8 [place] Powerport was assembled and the tubing was tunneled from the pocket to the insertion site.  The port was sutured in place in the pocket with 2-0 Prolene sutures.  The catheter was cut to appropriate length at 25 cm using fluoroscopic guidance.  The dilator and sheath were passed over the wire into the subclavian vein under fluoro.  The dilator and wire were removed and the catheter was passed through the sheath which was removed.  The port aspirated blood readily and flushed easily.  Fluoro confirmed no kinks in the catheter with the tip at the cavo-atrial junction.  Concentrated heparin was instilled into the port.  The wound was closed with 3-0 Vicryl and 4-0 Monocryl.  Steri-strips and clean dressings were applied.    I then changed the dressing in her left breast abscess/ tumor.  This seems to be granulating somewhat, but continues to ooze.  It looks cleaner than last week.  I repacked the wound with saline-soaked gauze and we  applied an ABD dressing.    The patient was extubated and brought to the recovery room in stable condition.    Wilmon Arms. Corliss Skains, MD, [facility]  04/12/2011 10:21 AM

## 2011-04-12 NOTE — Anesthesia Preprocedure Evaluation (Signed)
Anesthesia Evaluation  Patient identified by MRN, date of birth, ID band Patient awake    Reviewed: Allergy & Precautions, H&P , NPO status , Patient's Chart, lab work & pertinent test results, reviewed documented beta blocker date and time   Airway Mallampati: II TM Distance: >3 FB Neck ROM: Full    Dental  (+) Dental Advisory Given and Teeth Intact   Pulmonary neg pulmonary ROS,  clear to auscultation        Cardiovascular neg cardio ROS Regular Normal Denies cardiac symptoms   Neuro/Psych Negative Neurological ROS  Negative Psych ROS   GI/Hepatic negative GI ROS, Neg liver ROS,   Endo/Other  Negative Endocrine ROS  Renal/GU negative Renal ROS  Genitourinary negative   Musculoskeletal negative musculoskeletal ROS (+)   Abdominal   Peds negative pediatric ROS (+)  Hematology Anemia Hgb 8.0   Anesthesia Other Findings Upper front caps  Reproductive/Obstetrics Breast cancer                           Anesthesia Physical Anesthesia Plan  ASA: II  Anesthesia Plan: General   Post-op Pain Management:    Induction: Intravenous  Airway Management Planned: LMA  Additional Equipment:   Intra-op Plan:   Post-operative Plan: Extubation in OR  Informed Consent: I have reviewed the patients History and Physical, chart, labs and discussed the procedure including the risks, benefits and alternatives for the proposed anesthesia with the patient or authorized representative who has indicated his/her understanding and acceptance.     Plan Discussed with: CRNA and Surgeon  Anesthesia Plan Comments:         Anesthesia Quick Evaluation

## 2011-04-12 NOTE — H&P (Signed)
HPI  Alyssa May is a 46 y.o. female. Referred by Dr. Rubin Payor at [facility] ED for left breast mass  HPI  This is a healthy 46 year old female who first noticed a pea-sized mass in her left breast in November 2011. She had recently stopped breast-feeding. This area has been enlarging over the last year. In July of this last year she estimates that it was golfball sized. It has begun to protrude through the skin and causing some discomfort. The patient had a miscarriage in October 2012. She does not have a local primary care physician or a gynecologist. She presented to the emergency department on 03/09/11 2 to the increased discomfort. She was noted to have a 4 cm area of fluctuance with some fibrinous extending up towards the axilla and medially in the breast towards the nipple. The cystic area was aspirated and yielded a large amount of serosanguineous fluid. The patient states that the fluid has reaccumulated.  Past Medical History   Diagnosis  Date   .  Breast abscess     Past Surgical History   Procedure  Date   .  Dilation and curettage of uterus  01/04/11    Family History   Problem  Relation  Age of Onset   .  Diabetes  Mother    .  Hypertension  Mother     Social History  History   Substance Use Topics   .  Smoking status:  Never Smoker   .  Smokeless tobacco:  Never Used   .  Alcohol Use:  Yes      occasional    Allergies   Allergen  Reactions   .  Sulfa Antibiotics  Swelling    Current Outpatient Prescriptions   Medication  Sig  Dispense  Refill   .  HYDROcodone-acetaminophen (VICODIN) 5-500 MG per tablet  Take 1 tablet by mouth every 4 (four) hours as needed for pain.  30 tablet  0    Review of Systems  Review of Systems  Constitutional: Negative for fever, chills and unexpected weight change.  HENT: Negative for hearing loss, congestion, sore throat, trouble swallowing and voice change.  Eyes: Negative for visual disturbance.  Respiratory: Negative for cough and  wheezing.  Cardiovascular: Negative for chest pain, palpitations and leg swelling.  Gastrointestinal: Negative for nausea, vomiting, abdominal pain, diarrhea, constipation, blood in stool, abdominal distention and anal bleeding.  Genitourinary: Negative for hematuria, vaginal bleeding and difficulty urinating.  Musculoskeletal: Negative for arthralgias.  Skin: Negative for rash and wound.  Neurological: Negative for seizures, syncope and headaches.  Hematological: Negative for adenopathy. Does not bruise/bleed easily.  Psychiatric/Behavioral: Negative for confusion.  Menarche - age 68  First pregnancy - age 9  Breastfeed - yes  LMP - July  Miscarriage October 2012 - has not restarted regular periods since then  Blood pressure 118/70, pulse 68, temperature 96.9 F (36.1 C), temperature source Temporal, resp. rate 16, height 5\' 5"  (1.651 m), weight 191 lb (86.637 kg).  Physical Exam  Physical Exam  Breasts - right breast - no masses, no nipple discharge or inversion, no axillary lymphadenopathy  Left breast - 4 cm fluctuant, protruding area in the upper outer quadrant. Surrounding this fluctuance, there is a wider 8-10 cm area of firmness extending towards the lower breast with some peau-d'orange skin changes.  Data Reviewed  None  Assessment   We made a 1 cm incision and drained a large amount of serosanguinous fluid. This was sent for culture.  The cystic area decompressed completely. It was packed with 1/4 inch nugauze.  The area of firmness could represent surrounding inflammation, but is also concerning for possible breast cancer.   Plan   Daily packing with nu-gauze.  Reevaluate next week  Left breast ultrasound. Cannot do a mammogram at this time due to the open wound. May require core biopsy vs skin biopsy to rule out malignancy.    Wynona Luna., MD 03/15/2011 3:16 PM Signed  The patient returns for a wound check. The large fluctuant area is decompressed and appears free of  infection. I removed the packing. The area surrounding the formerly fluctuant area remains very firm. She will treat this area with neosporin and a dry dressing until healed.  She had an ultrasound yesterday which showed a 6.7 x 7.3 x 5.1 cm mass with enlarged adjacent lymph nodes in the axilla. These findings are concerning for carcinoma. The patient is trying to make some financial arrangements and then we will proceed with ultrasound-guided core biopsy to establish a definitive diagnosis. I spent some time with the patient and her husband discussing the probability of this mass representing breast carcinoma. We discussed the possibilities for treatment and surgery, which we will discuss further after establishing a diagnosis.  Wynona Luna., MD 03/30/2011 1:18 PM Addendum  The patient returns after her consultation with Dr. Darnelle Catalan and her MRI on 1/21. She is having a lot of pain in her left breast with purulent drainage from the I&D site. Dr. Darrall Dears tentative plan is to proceed with neoadjuvant chemotherapy. She has undergone chemo class and understands the need for a port.  MRI report below.  **ADDENDUM** CREATED: 03/29/2011 13:20:15  There is also diffuse sternal enhancement. This is nonspecific and  could be an indication of bony metastatic disease.  Addended by: Londell Moh. Azucena Kuba, M.D. on 03/29/2011 13:20:15.  **END ADDENDUM** SIGNED BY: Londell Moh. Azucena Kuba, M.D.  Study Result  *RADIOLOGY REPORT*  Clinical Data: Recently diagnosed left breast invasive ductal  carcinoma.  BILATERAL BREAST MRI WITH AND WITHOUT CONTRAST  Technique: Multiplanar, multisequence MR images of both breasts  were obtained prior to and following the intravenous administration  of 17ml of MultiHance. Three dimensional images were evaluated at  the independent DynaCad workstation.  Comparison: Recent mammogram, ultrasound and biopsy examinations.  Findings: Large, irregularly marginated enhancing mass deep in the    upper outer quadrant of the left breast, protruding from the  breast. This measures 10.2 x 7.6 x 6.7 cm in maximum dimensions.  This contains a biopsy marker clip artifact laterally. This has a  central nonenhancing portion containing a fluid/hematocrit or  fluid/protein level. This corresponds to an area recently incised  and drained by history. This mass has a mixture of enhancement  kinetics, including rapid washin/washout. The mass is abutting the  anterolateral portions of the left pectoralis major and minor  muscles with loss of the normal fat plane between the mass and the  muscles. It is difficult to determine if there is actual extension  into the muscles.  Also demonstrated is diffuse enlargement of the left breast  relative to the right breast with marked diffuse skin thickening on  the left. There is also mild diffuse skin enhancement on the left  as well as mild diffuse inhomogeneous enhancement in the majority  of the parenchyma of the left breast.  Multiple enlarged left axillary lymph nodes are demonstrated  lateral to the pectoralis minor muscle on the left as well as an  enlarged lymph node posterior to the lateral aspect of the  pectoralis minor muscle on the left. The largest node measures 1.9  cm in short axis diameter on inversion recovery image number 48.  Minimal background parenchymal enhancement in the right breast.  Scattered small nodular areas of enhancement in the right breast  with no dominant masses or areas of enhancement suspicious for  malignancy. No abnormal appearing internal mammary or right  axillary lymph nodes.  IMPRESSION:  1. 10.2 x 7.6 x 6.7 cm biopsy-proven invasive ductal carcinoma  deep in the upper outer quadrant of the left breast extending into  and through the skin of the breast. This is abutting the  pectoralis major and minor muscles with possible minimal muscle  invasion.  2. Multiple metastatic level I left axillary lymph nodes  and a  single metastatic level II left axillary lymph node.  3. Diffuse enlargement, edematous changes and enhancement in the  left breast compatible with changes due to an inflammatory  component of the patient's known left breast breast carcinoma.  4. No evidence of malignancy on the right.  THREE-DIMENSIONAL MR IMAGE RENDERING ON INDEPENDENT WORKSTATION:  Three-dimensional MR images were rendered by post-processing of the  original MR data on an independent workstation. The three-  dimensional MR images were interpreted, and findings were reported  in the accompanying complete MRI report for this study.  BI-RADS CATEGORY 6: Known biopsy-proven malignancy - appropriate  action should be taken.  Recommendation: Treatment plan.  Original Report Authenticated By: Darrol Angel, M.D.  The left breast has three small openings with some purulent fluid draining. There is some overlying erythema, as well as the previously noted firm breast cancer.  Imp: Left breast cancer with central necrosis and abscess with partial drainage  Plan: Prior to port insertion, I think the patient would benefit from a wide incision and drainage under anesthesia with washout of the breast abscess. Once the infection starts to clear up, we will proceed with port insertion within the next 1-2 weeks. I am concerned that inserting a port in the presence of an active abscess might result in bacteremia with an infected port.    Imp:  Metastatic left breast cancer Plan:  Port insertion.  The surgical procedure has been discussed with the patient.  Potential risks, benefits, alternative treatments, and expected outcomes have been explained.  All of the patient's questions at this time have been answered.  The likelihood of reaching the patient's treatment goal is good.  The patient understand the proposed surgical procedure and wishes to proceed.   Wilmon Arms. Corliss Skains, MD, [facility]  04/12/2011 7:23  AM

## 2011-04-12 NOTE — Progress Notes (Signed)
Called [facility] to call X-Ray report to Dr. Corliss Skains with report ASAP. Holding patient NPO till OK on port site per Dr. Corliss Skains. Herby Abraham verified they will call DR. Tsuei.

## 2011-04-22 ENCOUNTER — Encounter (HOSPITAL_COMMUNITY)
Admission: RE | Admit: 2011-04-22 | Discharge: 2011-04-22 | Disposition: A | Discharge status: Home / Self Care | Payer: Medicaid Other | Source: Ambulatory Visit | Attending: Oncology | Admitting: Oncology

## 2011-04-22 DIAGNOSIS — X58XXXA Exposure to other specified factors, initial encounter: Secondary | ICD-10-CM | POA: Insufficient documentation

## 2011-04-22 DIAGNOSIS — S21009A Unspecified open wound of unspecified breast, initial encounter: Secondary | ICD-10-CM | POA: Insufficient documentation

## 2011-04-22 DIAGNOSIS — C50419 Malignant neoplasm of upper-outer quadrant of unspecified female breast: Secondary | ICD-10-CM | POA: Insufficient documentation

## 2011-04-22 DIAGNOSIS — N61 Mastitis without abscess: Secondary | ICD-10-CM | POA: Insufficient documentation

## 2011-04-22 DIAGNOSIS — C773 Secondary and unspecified malignant neoplasm of axilla and upper limb lymph nodes: Secondary | ICD-10-CM | POA: Insufficient documentation

## 2011-04-22 DIAGNOSIS — C7800 Secondary malignant neoplasm of unspecified lung: Secondary | ICD-10-CM | POA: Insufficient documentation

## 2011-04-22 LAB — GLUCOSE, CAPILLARY: Glucose-Capillary: 111 mg/dL — ABNORMAL HIGH (ref 70–99)

## 2011-04-22 MED ORDER — IOHEXOL 300 MG/ML  SOLN
80.0000 mL | Freq: Once | INTRAMUSCULAR | Status: AC | PRN
  Administered 2011-04-22: 80 mL via INTRAVENOUS

## 2011-04-22 MED ORDER — FLUDEOXYGLUCOSE F - 18 (FDG) INJECTION
19.0000 | Freq: Once | INTRAVENOUS | Status: AC | PRN
  Administered 2011-04-22: 19 via INTRAVENOUS

## 2011-04-26 ENCOUNTER — Ambulatory Visit: Payer: PRIVATE HEALTH INSURANCE | Admitting: Physician Assistant

## 2011-04-26 ENCOUNTER — Ambulatory Visit: Payer: PRIVATE HEALTH INSURANCE

## 2011-04-26 ENCOUNTER — Ambulatory Visit (HOSPITAL_BASED_OUTPATIENT_CLINIC_OR_DEPARTMENT_OTHER): Payer: Medicaid Other | Admitting: Oncology

## 2011-04-26 ENCOUNTER — Other Ambulatory Visit (HOSPITAL_BASED_OUTPATIENT_CLINIC_OR_DEPARTMENT_OTHER): Payer: Medicaid Other | Admitting: Lab

## 2011-04-26 VITALS — BP 134/81 | HR 93 | Temp 98.3°F | Ht 65.0 in | Wt 187.4 lb

## 2011-04-26 DIAGNOSIS — C50419 Malignant neoplasm of upper-outer quadrant of unspecified female breast: Secondary | ICD-10-CM

## 2011-04-26 DIAGNOSIS — K7689 Other specified diseases of liver: Secondary | ICD-10-CM

## 2011-04-26 LAB — CBC WITH DIFFERENTIAL/PLATELET
Basophils Absolute: 0 10*3/uL (ref 0.0–0.1)
Eosinophils Absolute: 0.1 10*3/uL (ref 0.0–0.5)
HGB: 8.7 g/dL — ABNORMAL LOW (ref 11.6–15.9)
NEUT#: 4.3 10*3/uL (ref 1.5–6.5)
RBC: 4.37 10*6/uL (ref 3.70–5.45)
RDW: 22 % — ABNORMAL HIGH (ref 11.2–14.5)
WBC: 6.7 10*3/uL (ref 3.9–10.3)
lymph#: 1.7 10*3/uL (ref 0.9–3.3)

## 2011-04-26 NOTE — Progress Notes (Signed)
ID: JASHA HODZIC  DOB: 29-Apr-1965  MR#: [id number]  NWG#:[id number]  HPI: Alyssa May is a 46 year-old [place] woman who first noted a small mass in her Left breast November 2011, shortly after she stopped breast-feeding. It did not disappear with resumption of her periods and grew over the next year, and particularly since she had her miscarrriage October 2012. This brought her to the Emergency Room 03/09/2011 where she was found to have a fluctuant area in the UOQ of the Left breast measuring 8.4 cm. The tissue beneath the cystic area extending towards the axilla and medially was described as firm, but not erythematous. The cystic area was lanced and a large amount of serosanguinos material issued.  The patient was referred to Dr. Marcille Blanco who saw her 03/11/2011. By this time the fluid had largely reaccumulated.[name] He incised the lesion, again draining serosanguinous liquid, packed it, and set the patient up [facility]14[facility]. This showed an irregularly marginated mass measuring 7.3 cm, with abnormal axillary adenopathy. On 01/1102013 bilateral mammography confirmed a high-density mass in the Left UOQ. This was biopsied, as was a Left axillary lymph node. [facility] biopsies ([id number]) showed invasive ductal carcinoma, high grade, triple negative, with an Mib-1 of 97%.  Interval history:  The patient returns today for further discussion of her staging studies and treatment options. She has been doing some reading and has been taking a naturopathic product called Protocel. She and her husband have also been reading up on chemotherapy agents and worry that the doxorubicin cyclophosphamide docetaxel combination may not work well with the Protocel. Apparently the company suggests antimetabolites and specifically 5-FU as an option. Of course they also worried about other side effects of these drugs.  ROS:  She feels the breast wound is healing (because of the Protocel) and it is  getting harder to pack. They have an appointment with Dr. Margaree Mackintosh  in the next few days to discuss that. She is still having pain in the left breast area. This is throbbing and stabbing at times and is never absent although at times it is more intense than others. There is a bit of a smell associated with this. She had her port placed without significant complications. Otherwise a detailed review of systems was noncontributory.  Allergies  Allergen Reactions  . Sulfa Antibiotics Swelling    Current Outpatient Prescriptions  Medication Sig Dispense Refill  . Iodine, Kelp, (KELP PO) Take 2 tablets by mouth 2 (two) times daily.      . IRON PO Take 1 tablet by mouth daily.      . mupirocin nasal ointment (BACTROBAN) 2 % Place into the nose 2 (two) times daily. Use one-half of tube in each nostril twice daily for five (5) days. After application, press sides of nose together and gently massage.      [name] OVER THE COUNTER MEDICATION Take 1 tablet by mouth daily. Laminine supplement      . OVER THE COUNTER MEDICATION Take 1 tablet by mouth daily. Proactive Enzymes      . oxyCODONE-acetaminophen (PERCOCET) 5-325 MG per tablet Take 1 tablet by mouth every 4 (four) hours as needed.      . vitamin C (ASCORBIC ACID) 500 MG tablet Take 1,000 mg by mouth daily.        PMHx: s/p T&A; s/p D&C October 2012  Family Hx: the patient's father died from post-operative complications at age 75; the patient's mother is 13, lives in  [place] The patient is a single child  GYN Hx: GX P1, first pregnancy to term age 48. 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 Her husband Alyssa May is a Hospital doctor for a Acupuncturist in [place]. Their daughter, Alyssa May, is 3. The family stays part-time with the patient's mother in [place]  Objective: a young-appearing African-American woman, present today with her husband and  daughter  Ceasar Mons Vitals:   04/26/11 1146  BP: 134/81  Pulse: 93  Temp: 98.3 F (36.8 C)    BMI: Body mass index is 31.18 kg/(m^2).   ECOG FS: 0  Physical Exam:   Sclerae unicteric  Oropharynx clear  No peripheral adenopathy  Lungs clear -- no rales or rhonchi  Heart regular rate and rhythm  Abdomen benign  MSK no focal spinal tenderness, no peripheral edema  Neuro nonfocal  Breast exam: Not repeated today  Lab Results:      Chemistry      Component Value Date/Time   NA 137 04/08/2011 1230      Component Value Date/Time   CALCIUM 8.8 04/08/2011 1230       Lab Results  Component Value Date   WBC 6.7 04/26/2011    Studies/Results: Echo 04/04/2011 showed a well preserved ejection fraction in the 55-60% range, with normal wall motion. Dg Chest 1 View  04/22/2011  *RADIOLOGY REPORT*  Clinical Data: Left breast cancer with associated abscess, open wound with drainage  CT CHEST WITH CONTRAST  Technique:  Multidetector CT imaging of the chest was performed following the standard protocol during bolus administration of intravenous contrast.  Contrast: 80mL OMNIPAQUE IOHEXOL 300 MG/ML IV SOLN  Comparison: None.  Findings: 7.9 x 7.2 x 10.4 cm left breast mass with open ulceration to the skin surface.  Mass abuts and may involve the left pectoralis muscle (series 2/image 27). Overlying skin thickening in the inferior breast (series 2/image 47).  Associated left axillary lymph nodes measuring up to 1.9 x 2.0 cm (series 2/image 24).  Lungs are clear.  No suspicious pulmonary nodules.  No pleural effusion or pneumothorax.  Visualized thyroid is unremarkable.  The heart is normal in size.  No pericardial effusion.  No suspicious mediastinal, hilar, or left axillary lymphadenopathy.  Right chest port.  Visualized upper abdomen is notable for a 1.4 x 1.6 cm rim enhancing lesion in the lateral segment left hepatic lobe (series 2/image 55), suspicious for metastasis.  IMPRESSION: 10.4 cm left breast  mass with central necrosis and open ulceration to the skin surface.  Mass abuts and may involve the left pectoralis muscle.  Overlying skin thickening in the inferior breast.  Associated left axillary lymphadenopathy, measuring up to 1.9 x 2.0 cm.  Suspected 1.6 cm metastasis in the left hepatic lobe.  Original Report Authenticated By: Charline Bills, M.D.   Mr Breast Bilateral W Wo Contrast  03/29/2011  **ADDENDUM** CREATED: 03/29/2011 13:20:15  There is also diffuse sternal enhancement.  This is nonspecific and could be an indication of bony metastatic disease.  Addended by:  Londell Moh. Azucena Kuba, M.D. on 03/29/2011 13:20:15.  **END ADDENDUM** SIGNED BY: Londell Moh. Azucena Kuba, M.D.    04/22/2011  NUCLEAR MEDICINE PET CT INITIAL (PI) SKULL BASE TO THIGH  Technique:  19.0 mCi F-18 FDG was injected intravenously via the right antecubital fossa.  Full-ring PET imaging was performed from the skull base through the mid-thighs 71  minutes  after injection. CT data was obtained and used for attenuation correction and anatomic localization only.  (This was not acquired as a diagnostic CT examination.)  Fasting Blood Glucose:  111  Patient Weight:  184 pounds.  Comparison:  None.  Findings: 7.5 x 6.9 cm left breast mass with overlying ulceration / open wound to the skin surface, max SUV 33.6, reflecting primary breast malignancy.  Mass abuts and may involve the left pectoralis muscle (series 2/image 69).  Overlying skin thickening involving the inferior breast, max SUV 3.4.  Associated left axillary lymphadenopathy measuring up to 2.3 x 1.9 cm (series 2/image 68), max SUV 21.3.  1.6 cm lesion in the lateral segment left hepatic lobe (series 2/image 110), max SUV 13.9.  No suspicious hypermetabolic foci in the neck.  No cervical lymphadenopathy.  No suspicious pulmonary nodules.  No mediastinal, hilar, or right axillary lymphadenopathy.  Right chest port.  No additional suspicious hypermetabolic foci in the abdomen/pelvis. No  suspicious abdominopelvic lymphadenopathy.  No evidence of bowel obstruction.  Bladder is mildly thick-walled.  No focal osseous lesions.  IMPRESSION: Large left breast mass with overlying ulceration / open wound to the skin surface, max SUV 33.6, reflecting primary breast malignancy.  Mass abuts and may involve the left pectoralis muscle. Overlying skin thickening involving the inferior breast, max SUV 3.4.  Associated left axillary nodal metastases, max SUV 21.3.  Metastasis in the lateral segment left hepatic lobe, max SUV 13.9.  Original Report Authenticated By: Charline Bills, M.D.   04/12/2011  *RADIOLOGY REPORT*  Clinical Data: Revision of Port-A-Cath  PORTABLE CHEST - 1 VIEW  Comparison: Portable chest x-ray of 04/12/2011  Findings: A right-sided Port-A-Cath is present.  The tip is near the expected SVC - RA junction.  No pneumothorax is seen.  The lungs remain clear.  The heart is within upper limits of normal. No bony abnormality is noted.  IMPRESSION: Right Port-A-Cath present with tip near the expected SVC - RA junction.  Original Report Authenticated By: Juline Patch, M.D.    Assessment: 46 year-old [place] woman s/p Left breast biopsy 03/18/2011 showing a clinical T3-4, N1, Stage III invasive ductal carcinoma, high grade, triple negative, with an Mib-1 of 97%, with scans showing M1 disease   Plan: I think the fact that the lesion in the liver is hot on PET scan makes it exceedingly likely that we are dealing with metastatic disease there. Some findings by MRI on the sternum suggest involvement there, but that was not noted on the PET scan and remains unconfirmed.  Today we had a long discussion regarding what this means. Stage IV breast cancer is not curable. In many cases it is very reasonable to treat with single agents for control. The reason I do not favor that in her case is her youth, the fact that we have an open wound that needs aggressive treatment if she is going to be able to  bring remove it  (ultimately with a modified radical mastectomy), and the fact that with oligometastatic disease aggressive treatment (including if necessary partial hepatectomy) may improve survival.  They have a strong belief in naturopathic medicine. They are willing to consider an antimetabolite like 5-FU so we can work with the product they are currently using. They understand that in my opinion this is not the best course of action at this time.   I made it clear that I am a biologically trained physician, and that I have very little experience with naturopathic products. I specifically  asked them if they felt the medical system was pushing expensive drugs that are known not to work, and is hiding or fighting cheaper treatments that are actual cures, since this is a theme broached by many who promote naturopathic alternatives. That is not their feeling however. I also strongly suggested a second opinion would be a good idea. They said they feel comfortable here and have no interest in seeking another medical review.  I do not think going with capecitabine is her best course, but it is an approved agent in stage IV disease and should help bring her cancer under control. Today we discussed some possible side effects, toxicities and complications of treatment and she will meet with our financial advisor to see what we can do to help get her that drug. I set her up for liver biopsy and she will see [place] the next day to get the treatment started. They know to call for any problems that may develop before the next visit.   Kylee Umana C 04/26/2011

## 2011-04-27 ENCOUNTER — Encounter (HOSPITAL_COMMUNITY): Payer: Self-pay | Admitting: Pharmacy Technician

## 2011-04-28 ENCOUNTER — Encounter (INDEPENDENT_AMBULATORY_CARE_PROVIDER_SITE_OTHER): Payer: Self-pay | Admitting: Surgery

## 2011-04-28 ENCOUNTER — Ambulatory Visit (INDEPENDENT_AMBULATORY_CARE_PROVIDER_SITE_OTHER): Payer: Medicaid Other | Admitting: Surgery

## 2011-04-28 ENCOUNTER — Other Ambulatory Visit: Payer: Self-pay | Admitting: Oncology

## 2011-04-28 VITALS — BP 126/80 | HR 70 | Temp 98.1°F | Resp 18 | Ht 65.0 in | Wt 184.0 lb

## 2011-04-28 DIAGNOSIS — C50419 Malignant neoplasm of upper-outer quadrant of unspecified female breast: Secondary | ICD-10-CM

## 2011-04-28 DIAGNOSIS — C50919 Malignant neoplasm of unspecified site of unspecified female breast: Secondary | ICD-10-CM

## 2011-04-28 NOTE — Progress Notes (Signed)
S/p right subclavian vein port placement on 2/5.  The wound is healed, but it causes some tenderness with her bra.  She had another appointment with Dr. Darnelle Catalan last week to discuss her treatment.  Unfortunately, her PET scan shows an area of probable metastases in her liver.  She has started to take a naturopathic product called Protocel on the recommendation of some friends from church.  Unfortunately, this product limits the options for chemotherapeutic agents.    Her left breast abscess remains quite tender, but seems to be healing.  There is still a slight odor of necrotic tissue, but the wound is much shallower.  It seems to be granulating well with some oozing.  The wound is packed with a single saline-moistened 4x4.  Continue with these dressing changes until healed.  I spent some time discussing her management with the patient and her husband.  I encouraged them not to let their fixation on naturopathic remedies interfere, inhibit, or delay the recommended treatment for this metastatic aggressive cancer.  She obviously is concerned with side effects, but I reminded her that she has very little in the way of other medical problems and she is well-suited to tolerate aggressive therapy.  They are also considering a possible second opinion and will contact Dr. Darnelle Catalan if they decide to pursue a second opinion.  Follow-up in 3 weeks.  Wilmon Arms. Corliss Skains, MD, [facility]  04/28/2011 4:53 PM

## 2011-04-29 ENCOUNTER — Other Ambulatory Visit: Payer: Self-pay | Admitting: Radiology

## 2011-04-29 ENCOUNTER — Other Ambulatory Visit: Payer: Self-pay | Admitting: Physician Assistant

## 2011-05-02 ENCOUNTER — Encounter (HOSPITAL_COMMUNITY): Payer: Self-pay

## 2011-05-02 ENCOUNTER — Ambulatory Visit (HOSPITAL_COMMUNITY): Admission: RE | Admit: 2011-05-02 | Payer: Medicaid Other | Source: Ambulatory Visit

## 2011-05-02 ENCOUNTER — Inpatient Hospital Stay (HOSPITAL_COMMUNITY): Admission: RE | Admit: 2011-05-02 | Payer: Medicaid Other | Source: Ambulatory Visit

## 2011-05-02 ENCOUNTER — Telehealth: Payer: Self-pay | Admitting: Oncology

## 2011-05-02 NOTE — Telephone Encounter (Signed)
Pt;s biopsy is scheduled for 05/02/2011.

## 2011-05-03 ENCOUNTER — Telehealth: Payer: Self-pay | Admitting: Oncology

## 2011-05-03 ENCOUNTER — Other Ambulatory Visit: Payer: PRIVATE HEALTH INSURANCE | Admitting: Lab

## 2011-05-03 ENCOUNTER — Ambulatory Visit: Payer: PRIVATE HEALTH INSURANCE | Admitting: Physician Assistant

## 2011-05-03 NOTE — Telephone Encounter (Signed)
Pt had her biopsy done on 05/02/2011

## 2011-05-04 NOTE — Progress Notes (Signed)
[facility] on 05/03/11.  Letter mailed to patient.

## 2011-05-05 ENCOUNTER — Encounter (HOSPITAL_COMMUNITY): Payer: Self-pay | Admitting: Surgery

## 2011-05-17 ENCOUNTER — Other Ambulatory Visit: Payer: PRIVATE HEALTH INSURANCE | Admitting: Lab

## 2011-05-17 ENCOUNTER — Ambulatory Visit: Payer: PRIVATE HEALTH INSURANCE | Admitting: Physician Assistant

## 2011-05-17 ENCOUNTER — Ambulatory Visit: Payer: PRIVATE HEALTH INSURANCE

## 2011-05-17 NOTE — Progress Notes (Signed)
[facility] today.  Letter mailed to patient.  

## 2011-05-18 ENCOUNTER — Encounter (INDEPENDENT_AMBULATORY_CARE_PROVIDER_SITE_OTHER): Payer: Medicaid Other | Admitting: Surgery

## 2011-05-24 ENCOUNTER — Other Ambulatory Visit (HOSPITAL_BASED_OUTPATIENT_CLINIC_OR_DEPARTMENT_OTHER): Payer: Medicaid Other | Admitting: Lab

## 2011-05-24 ENCOUNTER — Ambulatory Visit (HOSPITAL_BASED_OUTPATIENT_CLINIC_OR_DEPARTMENT_OTHER): Payer: Medicaid Other | Admitting: Oncology

## 2011-05-24 VITALS — BP 108/75 | HR 98 | Temp 98.3°F | Ht 65.0 in | Wt 182.6 lb

## 2011-05-24 DIAGNOSIS — C787 Secondary malignant neoplasm of liver and intrahepatic bile duct: Secondary | ICD-10-CM

## 2011-05-24 DIAGNOSIS — C50419 Malignant neoplasm of upper-outer quadrant of unspecified female breast: Secondary | ICD-10-CM

## 2011-05-24 LAB — CBC WITH DIFFERENTIAL/PLATELET
BASO%: 0.1 % (ref 0.0–2.0)
EOS%: 2 % (ref 0.0–7.0)
Eosinophils Absolute: 0.1 10*3/uL (ref 0.0–0.5)
HGB: 9 g/dL — ABNORMAL LOW (ref 11.6–15.9)
LYMPH%: 18.6 % (ref 14.0–49.7)
MCV: 66.8 fL — ABNORMAL LOW (ref 79.5–101.0)
MONO#: 0.7 10*3/uL (ref 0.1–0.9)
NEUT#: 4.2 10*3/uL (ref 1.5–6.5)
NEUT%: 68.3 % (ref 38.4–76.8)
Platelets: 596 10*3/uL — ABNORMAL HIGH (ref 145–400)
RBC: 4.35 10*6/uL (ref 3.70–5.45)
RDW: 25.8 % — ABNORMAL HIGH (ref 11.2–14.5)
WBC: 6.2 10*3/uL (ref 3.9–10.3)

## 2011-05-24 NOTE — Progress Notes (Signed)
ID: Alyssa May   DOB: [birth year]  MR#: [id number]  WUJ#:[id number]  HISTORY OF PRESENT ILLNESS: Alyssa May is a 46 year-old [place] woman who first noted a small mass in her Left breast November 2011, shortly after she stopped breast-feeding. It did not disappear with resumption of her periods and grew over the next year, and particularly since she had her miscarrriage October 2012. This brought her to the Emergency Room 03/09/2011 where she was found to have a fluctuant area in the UOQ of the Left breast measuring 8.4 cm. The tissue beneath the cystic area extending towards the axilla and medially was described as firm, but not erythematous. The cystic area was lanced and a large amount of serosanguinos material issued.  The patient was referred to Dr. Marcille Blanco who saw her 03/11/2011. By this time the fluid had largely reaccumulated.[name] He incised the lesion, again draining serosanguinous liquid, packed it, and set the patient up [facility] 03[facility]14/2011. This showed an irregularly marginated mass measuring 7.3 cm, with abnormal axillary adenopathy. On 01/1102013 bilateral mammography confirmed a high-density mass in the Left UOQ. This was biopsied, as was a Left axillary lymph node. [facility] biopsies ([id number]) showed invasive ductal carcinoma, high grade, triple negative, with an Mib-1 of 97%. Subsequent treatments are as detailed below  INTERVAL HISTORY: Alyssa May returns today with her husband Alyssa May and their daughter Alyssa May. The patient missed 2 appointments here because they were in the [place] area. They conferred with his family regarding the best way to go. He feels that their prayers and words "were life giving". They're planning to go back and meet with her family this coming week, and his expectation there is "it's going to be more of a mixed bag".  REVIEW OF SYSTEMS: She feels the wound is continuing to heal. Breath addresses that for her and of she was  reluctant to have it under [place] today because it might be difficult to reproduce the dressing he has made, which entirely covers the breast and feels more like a rigid support that a mere dressing. They tell me that the wound is now very shallow so it cannot no longer be packed, they just covered over. She does have pain there and takes Percocet a couple of times a day to help with that. That constipates her and she is considering having a cleansing colonic. She is having a toothache in her right mandible. She has been hesitant to go to the dentist but she does have a dentist and I have encouraged her to get that taken care of before she starts any kind of chemotherapy. As far as that is concerned she never did pick up the capecitabine but she now has Medicaid so she actually got the pills in her hand today and I brought them for me to review. Otherwise a detailed review of systems was noncontributory  PAST MEDICAL HISTORY: Past Medical History  Diagnosis Date  . Breast abscess   . No pertinent past medical history   . Cancer     lt br     PAST SURGICAL HISTORY: Past Surgical History  Procedure Date  . Dilation and curettage of uterus 01/04/11  . Breast mass excision 2013  . Tonsillectomy   . Portacath placement 04/12/2011    Procedure: INSERTION PORT-A-CATH;  Surgeon: Wilmon Arms. Corliss Skains, MD;  Location: [facility];  Service: General;  Laterality: Right;  right subclavian    FAMILY HISTORY Family History  Problem Relation  Age of Onset  . Diabetes Mother   . Hypertension Mother   the patient's father died from post-operative complications at age 23; the patient's mother is 55, lives in [place] The patient is a single child  GYNECOLOGIC HISTORY: first pregnancy to term age 64. 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 Her husband [name] is a Hospital doctor for a Consulting civil engineer in [place]. Their daughter, Alyssa May, is 3. The family stays part-time with the patient's mother in [place]    ADVANCED DIRECTIVES:  HEALTH MAINTENANCE: History  Substance Use Topics  . Smoking status: Never Smoker   . Smokeless tobacco: Never Used  . Alcohol Use: Yes     occasional     Colonoscopy:  PAP:  Bone density:  Lipid panel:  Allergies  Allergen Reactions  . Sulfa Antibiotics Swelling    Current Outpatient Prescriptions  Medication Sig Dispense Refill  . capecitabine (XELODA) 500 MG tablet Take by mouth 2 (two) times daily after a meal.      . IRON PO Take 1 tablet by mouth daily.      [name] OVER THE COUNTER MEDICATION Take 1 tablet by mouth daily. Proactive Enzymes      . OVER THE COUNTER MEDICATION Take 1 tablet by mouth daily. protocel      . oxyCODONE-acetaminophen (PERCOCET) 5-325 MG per tablet Take 1 tablet by mouth every 4 (four) hours as needed. Pain         OBJECTIVE: Young African American woman who appears comfortable Filed Vitals:   05/24/11 1634  BP: 108/75  Pulse: 98  Temp: 98.3 F (36.8 C)     Body mass index is 30.39 kg/(m^2).    ECOG FS: 1  Sclerae unicteric Oropharynx clear No peripheral adenopathy Lungs no rales or rhonchi Heart regular rate and rhythm Abd benign MSK no focal spinal tenderness, no peripheral edema Neuro: nonfocal Breasts: Deferred secondary to the dressing issuedescribed above  LAB RESULTS: Lab Results  Component Value Date   WBC 6.2 05/24/2011   NEUTROABS 4.2 05/24/2011   HGB 9.0* 05/24/2011   HCT 29.1* 05/24/2011   MCV 66.8* 05/24/2011   PLT 596* 05/24/2011      Chemistry      Component Value Date/Time   NA 137 04/08/2011 1230   K 3.6 04/08/2011 1230   CL 101 04/08/2011 1230   CO2 28 04/08/2011 1230   BUN 7 04/08/2011 1230   CREATININE 0.81 04/08/2011 1230      Component Value Date/Time   CALCIUM 8.8 04/08/2011 1230   ALKPHOS 55 03/29/2011 1606   AST 24 03/29/2011 1606   ALT 11 03/29/2011 1606   BILITOT 0.2* 03/29/2011  1606       Lab Results  Component Value Date   LABCA2 16 03/29/2011    No components found with this basename: [id number]    No results found for this basename: INR:1;PROTIME:1 in the last 168 hours  Urinalysis No results found for this basename: colorurine, appearanceur, labspec, phurine, glucoseu, hgbur, bilirubinur, ketonesur, proteinur, urobilinogen, nitrite, leukocytesur       STUDIES: No new results found.  ASSESSMENT: 46 year-old [place] woman s/p Left breast biopsy 03/18/2011 showing a clinical T3-4, N1, Stage III invasive ductal carcinoma, high grade, triple negative, with an Mib-1 of 97%, with a single liver metastasis pathologically confirmed 03/31/2011  PLAN: She continues on the Protocell and feels  that is helping her breast wound heal. She is now ready to start the capecitabine, and I reviewed the possible side effects, toxicities and complications. Because of the upcoming trip to [facility]. She will not start the capecitabine until April first. She knows to stop it after a week, and I have made her appointments here 06/20/2011 and every two weeks thereafter so we can check labs and troubleshoot side-effects before proceeding to the next cycle. I also made her an appointment with me the first week in June and before that visit she will have a re-staging Chest CT.  I again suggested a second opinion but at this point they decline. I gave them copies of her pathology and radiology reports in case they decide to seek a second opinion while in [place]. She knows to call for any problems that may develop before the next visit   Lucindia Lemley C    05/24/2011

## 2011-05-25 ENCOUNTER — Encounter: Payer: Self-pay | Admitting: Oncology

## 2011-05-25 NOTE — Progress Notes (Signed)
Patient received one prescription from [facility] on 05/24/11 $3.00,her remaninig balance [facility] $394.00 and [facility] $600.00.

## 2011-05-26 ENCOUNTER — Encounter (INDEPENDENT_AMBULATORY_CARE_PROVIDER_SITE_OTHER): Payer: Self-pay | Admitting: Surgery

## 2011-05-26 ENCOUNTER — Ambulatory Visit (INDEPENDENT_AMBULATORY_CARE_PROVIDER_SITE_OTHER): Payer: Medicaid Other | Admitting: Surgery

## 2011-05-26 ENCOUNTER — Other Ambulatory Visit: Payer: Self-pay | Admitting: *Deleted

## 2011-05-26 ENCOUNTER — Telehealth: Payer: Self-pay | Admitting: *Deleted

## 2011-05-26 VITALS — BP 136/78 | HR 64 | Temp 96.8°F | Resp 16 | Ht 65.0 in | Wt 182.0 lb

## 2011-05-26 DIAGNOSIS — C50419 Malignant neoplasm of upper-outer quadrant of unspecified female breast: Secondary | ICD-10-CM

## 2011-05-26 MED ORDER — OXYCODONE-ACETAMINOPHEN 5-325 MG PO TABS: 1.0000 | ORAL_TABLET | ORAL | Status: AC | PRN

## 2011-05-26 MED ORDER — OXYCODONE-ACETAMINOPHEN 5-325 MG PO TABS: 1.0000 | ORAL_TABLET | Freq: Two times a day (BID) | ORAL | Status: DC | PRN

## 2011-05-26 NOTE — Progress Notes (Signed)
The patient returns today for a wound check after four weeks.  She reports that she has a considerable amount of drainage coming from the wound, but they are only able to fit one 4x4 into the wound.  The breast continues to cause pain, mostly with dressing changes.  She has bleeding with the dressing changes.  The breast feels much heavier than the contralateral breast.    Unfortunately, she continues to take this Protocel supplement that was recommended to her by a midwife from her church.  She is also rubbing the supplement on her wound.  Apparently, this supplement limits the spectrum of chemotherapeutic agents that can be used.  She has yet to begin chemotherapy, despite her port being placed over six weeks ago.  Filed Vitals:   05/26/11 1133  BP: 136/78  Pulse: 64  Temp: 96.8 F (36 C)  Resp: 16   Overall, she seems to be in good spirits and feeling well. We removed a very large bulky dressing from the lateral left breast.  The wound remains quite large, with an opening about 5 cm in diameter.  The tumor is significantly enlarged, filling the abscess cavity.  It is actually begin to protrude through the open wound.  There is a brownish substance on the skin around the wound, which the patient states is the "protocel".  I washed this substance off of the skin.  These is significant thickening of the surrounding skin.  No significant erythema.  The drainage is somewhat purulent and is coming around the tumor.  There is an odor of necrotic tissue coming from the wound.    Imp:  T4, N1, Stage III left invasive ductal carcinoma with liver metastasis.  The tumor is progressing without adequate treatment and her wound is not healing well.  There continues to be signs of tumor necrosis.  Unfortunately, the patient's reliance on the protocel supplement is significantly hampering our ability to adequately treat her advanced cancer.  It has limited Dr. Darrall Dears choices for chemotherapeutic agents and  has delayed her treatment.  Quite obviously, this supplement is not curing her cancer, as the tumor has enlarged significantly in the last four weeks.  The midwife that prescribed this supplement is obviously practicing medicine outside the scope of her expertise and this will likely contribute to a very poor outcome for this patient.  I let the patient know in plain terms that her current treatment regimen was clearly not working, but her demeanor made it obvious that she was not interested in changing at this time.  Apparently, she has consented to start Xeloda in two weeks.  Her wound will likely be open for some time, since actively growing tumor is keeping it from closing.  Continue current dressing changes.  I also gave her a refill of Percocet.  Follow-up in one month.  I sincerely hope that the patient takes my advice, abandons this alternative supplement, and begins aggressive chemotherapy soon before it is too late.  She came alone to her visit today, as her husband was still asleep when she left the house for her 11 AM appointment.  Wilmon Arms. Corliss Skains, MD, [facility]  05/26/2011 3:42 PM

## 2011-05-26 NOTE — Telephone Encounter (Signed)
gave patient appointment for 06-20-2011 at 1:15 07-04-2011 1:15pm printed out calendar and gave to the patient

## 2011-06-07 ENCOUNTER — Ambulatory Visit: Payer: PRIVATE HEALTH INSURANCE

## 2011-06-14 ENCOUNTER — Encounter (INDEPENDENT_AMBULATORY_CARE_PROVIDER_SITE_OTHER): Payer: Medicaid Other | Admitting: Surgery

## 2011-06-15 ENCOUNTER — Other Ambulatory Visit: Payer: Self-pay | Admitting: Physician Assistant

## 2011-06-15 ENCOUNTER — Telehealth: Payer: Self-pay | Admitting: *Deleted

## 2011-06-15 NOTE — Telephone Encounter (Signed)
left voice message to inform the patient appointment new time and but same day of the appointment

## 2011-06-20 ENCOUNTER — Ambulatory Visit: Payer: Medicaid Other | Admitting: Physician Assistant

## 2011-06-20 ENCOUNTER — Other Ambulatory Visit: Payer: Medicaid Other | Admitting: Lab

## 2011-06-20 NOTE — Progress Notes (Signed)
[facility] today.  Letter mailed to patient.  

## 2011-06-21 ENCOUNTER — Encounter (INDEPENDENT_AMBULATORY_CARE_PROVIDER_SITE_OTHER): Payer: Medicaid Other | Admitting: Surgery

## 2011-06-23 ENCOUNTER — Ambulatory Visit (INDEPENDENT_AMBULATORY_CARE_PROVIDER_SITE_OTHER): Payer: Medicaid Other | Admitting: Surgery

## 2011-06-23 ENCOUNTER — Encounter (INDEPENDENT_AMBULATORY_CARE_PROVIDER_SITE_OTHER): Payer: Self-pay | Admitting: Surgery

## 2011-06-23 VITALS — BP 128/86 | HR 88 | Temp 97.2°F | Resp 16 | Ht 65.0 in | Wt 174.6 lb

## 2011-06-23 DIAGNOSIS — C50419 Malignant neoplasm of upper-outer quadrant of unspecified female breast: Secondary | ICD-10-CM

## 2011-06-23 MED ORDER — OXYCODONE-ACETAMINOPHEN 5-325 MG PO TABS: 1.0000 | ORAL_TABLET | Freq: Four times a day (QID) | ORAL | Status: DC | PRN

## 2011-06-23 NOTE — Progress Notes (Signed)
The patient returns for another wound check.  Unfortunately, she has not started her Xeloda or any other form of treatment for her left breast cancer.  She complains of the pain and the "heaviness" of this left breast.  There is a moderate amount of drainage and a foul odor coming from the open wound.  She also has a punctate opening on the medial upper quadrant of her left breast with persistent serous drainage. The open part of the wound measures at least 15 cm across and the tumor mass is protruding about 8 cm above the original contour of the breast.  This extends up towards her axilla.  There is some bleeding from the surface of the tumor.    Her port remains in place, but it has never been flushed, despite being placed on 04/12/11.    She missed her scheduled appointment at the [facility] on 06/20/11.  She continues to take this "protocel" supplement on the recommendation of a midwife at her church.  Imp: Stage IV invasive ductal carcinoma - untreated due to patient non-compliance.  I spent some more time explaining to the patient and her husband that I have very little to offer her, since she refuses to follow our treatment recommendations.  I cannot perform a mastectomy at this time, since she would have a massive open wound her chest wall, likely with cancer in her chest wall, and that wound would not likely heal.  Once again, I recommended that she follow Dr. Darrall Dears treatment recommendations in hopes of shrinking the tumor enough to allow for a mastectomy with wound healing/ closure.    The patient and her husband continue to cling to their belief that this naturopathic supplement is working, despite the blatantly obvious visible evidence to the contrary.  She continues to state that the "protocel" is "lysing the tumor", whereas the tumor is actually necrotic because it has become so large that it has outgrown its blood supply.  She likely has metastatic disease already, and in the three  months since diagnosis, no treatment has been initiated because of her non-compliance.  I told her and her husband in very plain terms that their continued reliance on this supplement and the recommendations of a midwife over the treatment recommendations of our multi-disciplinary breast cancer team would almost certainly result in her unfortunate demise.  I tried to appeal to their sense of responsibility for their 44-year-old daughter, who is in the room.  However, similar to the last visit, their facial expressions and demeanor make me think that they will not listen to our recommendations.  I am not sure why they keep coming back for office visits, since there is nothing to be done surgically while this tumor grows aggressively and untreated.    This is an extremely sad situation, because the patient is otherwise in excellent health and could have a good chance at a favorable outcome with aggressive treatment.  I refilled her Percocet prescription.  She can continue the wet to dry dressings over this open wound/ tumor, but it will likely never heal without treatment.  I begged them to keep their appointment with Dr. Darnelle Catalan next week and to begin treatment.  Otherwise, I think the patient is contributing to her own demise.  Wilmon Arms. Corliss Skains, MD, [facility]  06/23/2011 3:54 PM

## 2011-06-23 NOTE — Patient Instructions (Signed)
Continue dressing changes.  ?

## 2011-06-27 ENCOUNTER — Telehealth (INDEPENDENT_AMBULATORY_CARE_PROVIDER_SITE_OTHER): Payer: Self-pay

## 2011-06-27 NOTE — Telephone Encounter (Signed)
The patient called and would like to discuss radiation shrinking her tumor.  She would like to speak to Dr Corliss Skains or his nurse on Tuesday.

## 2011-06-27 NOTE — Telephone Encounter (Signed)
I notified the patient that she needs to call Dr Darnelle Catalan with these questions.

## 2011-06-27 NOTE — Telephone Encounter (Signed)
She needs to talk to Dr. Darnelle Catalan at the cancer center about that.  I don't think it would work, since she has metastatic disease, but he would know more about that.  I think she has an appointment with him soon.

## 2011-06-27 NOTE — Telephone Encounter (Signed)
I advised the patient that Pattricia Boss is not in today.

## 2011-06-28 ENCOUNTER — Ambulatory Visit (HOSPITAL_BASED_OUTPATIENT_CLINIC_OR_DEPARTMENT_OTHER): Payer: Medicaid Other | Admitting: Oncology

## 2011-06-28 ENCOUNTER — Ambulatory Visit: Payer: PRIVATE HEALTH INSURANCE

## 2011-06-28 ENCOUNTER — Other Ambulatory Visit: Payer: Self-pay | Admitting: *Deleted

## 2011-06-28 VITALS — BP 132/81 | HR 105 | Temp 98.2°F | Ht 65.0 in | Wt 170.5 lb

## 2011-06-28 DIAGNOSIS — C50419 Malignant neoplasm of upper-outer quadrant of unspecified female breast: Secondary | ICD-10-CM

## 2011-06-28 DIAGNOSIS — R52 Pain, unspecified: Secondary | ICD-10-CM

## 2011-06-28 DIAGNOSIS — C801 Malignant (primary) neoplasm, unspecified: Secondary | ICD-10-CM | POA: Insufficient documentation

## 2011-06-28 DIAGNOSIS — C787 Secondary malignant neoplasm of liver and intrahepatic bile duct: Secondary | ICD-10-CM

## 2011-06-28 NOTE — Progress Notes (Signed)
ID: Alyssa May   DOB: [birth year]  MR#: [id number]  WGN#:[id number]  HISTORY OF PRESENT ILLNESS: Alyssa May is a 46 year-old [place] woman who first noted a small mass in her Left breast November 2011, shortly after she stopped breast-feeding. It did not disappear with resumption of her periods and grew over the next year, and particularly since she had her miscarrriage October 2012. This brought her to the Emergency Room 03/09/2011 where she was found to have a fluctuant area in the UOQ of the Left breast measuring 8.4 cm. The tissue beneath the cystic area extending towards the axilla and medially was described as firm, but not erythematous. The cystic area was lanced and a large amount of serosanguinos material issued.  The patient was referred to Dr. Marcille Blanco who saw her 03/11/2011. By this time the fluid had largely reaccumulated.[name] He incised the lesion, again draining serosanguinous liquid, packed it, and set the patient up for [facility] breast [facility] 03/14/2011. This showed an irregularly marginated mass measuring 7.3 cm, with abnormal axillary adenopathy. On 01/1102013 bilateral mammography confirmed a high-density mass in the Left UOQ. This was biopsied, as was a Left axillary lymph node. [place] biopsies ([id number]) showed invasive ductal carcinoma, high grade, triple negative, with an Mib-1 of 97%. Subsequent treatments are as detailed below  INTERVAL HISTORY: Alyssa May returns today with her husband [name] and their daughter [name]. She missed her appointment with [place] last week. She did meet with Dr. Corliss Skains and he impressed on her of the need to proceed with standard treatment for her cancer:  The patient had previously agreed to start capecitabine together with her alternative medications, but actually never had done so.  REVIEW OF SYSTEMS: She is having considerably more pain. This is worse with movement, particularly when she has to ride in a car, with dressing changes, or with  anything that causes any change of position in the breast, including turning in bed during sleep. He describes the pain as a 10. She takes either Roxicodone or hydrocodone for this (some days she tells me L1 worked better than another)  At perhaps one tablet 4 times a day. This brings the pain down 2 about a 6 temporarily. She has not been constipated despite the pain medication. She denies fevers, bleeding, or other areas of obvious cancer involvement. She has not had any headaches except sometimes when she lies down, no visual changes, or dizziness. Has been no cough phlegm production pleurisy or shortness of breath. She tells me she has lost 25 pounds despite having a good diet. There is no edema of the lower extremities. A detailed review of systems was otherwise stable.  PAST MEDICAL HISTORY: Past Medical History  Diagnosis Date  . Breast abscess   . No pertinent past medical history   . Cancer     lt br     PAST SURGICAL HISTORY: Past Surgical History  Procedure Date  . Dilation and curettage of uterus 01/04/11  . Breast mass excision 2013  . Tonsillectomy   . Portacath placement 04/12/2011    Procedure: INSERTION PORT-A-CATH;  Surgeon: Wilmon Arms. Tsuei, MD;  Location: [facility];  Service: General;  Laterality: Right;  right subclavian  . Incision and drainage breast abscess 03/31/11    FAMILY HISTORY Family History  Problem Relation Age of Onset  . Diabetes Mother   . Hypertension Mother   the patient's father died from post-operative complications at age 48; the patient's mother is 63, lives  in [place] The patient is a single child  GYNECOLOGIC HISTORY: first pregnancy to term age 13. 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 area. Her husband Heavan Francom is a Hospital doctor for a Acupuncturist in [employer]. Their daughter, [name], is 3. The family stays part-time with the  patient's mother in [place]    ADVANCED DIRECTIVES:  HEALTH MAINTENANCE: History  Substance Use Topics  . Smoking status: Never Smoker   . Smokeless tobacco: Never Used  . Alcohol Use: Yes     occasional     Colonoscopy:  PAP:  Bone density:  Lipid panel:  Allergies  Allergen Reactions  . Sulfa Antibiotics Swelling    Current Outpatient Prescriptions  Medication Sig Dispense Refill  . IRON PO Take 1 tablet by mouth daily.      . NON FORMULARY Take 2 tablets by mouth 3 (three) times daily.      [name] OVER THE COUNTER MEDICATION Take 1 tablet by mouth daily. Proactive Enzymes      . OVER THE COUNTER MEDICATION Take 1 tablet by mouth daily. protocel      . oxyCODONE-acetaminophen (PERCOCET) 5-325 MG per tablet Take 1 tablet by mouth every 6 (six) hours as needed. Pain  60 tablet  0  . capecitabine (XELODA) 500 MG tablet Take by mouth 2 (two) times daily after a meal.        OBJECTIVE: Young [place] American woman who appears uncomfortable Filed Vitals:   06/28/11 1333  BP: 132/81  Pulse: 105  Temp: 98.2 F (36.8 C)     Body mass index is 28.37 kg/(m^2).    ECOG FS: 1 Weight is down 22 pounds compared with her January weight (193-171) Sclerae unicteric Oropharynx clear Lungs no rales or rhonchi Heart regular rate and rhythm Abd soft, nontender, positive bowel sounds MSK no focal spinal tenderness, no lower extremity edema Neuro: nonfocal Breasts: there is a large fungating mass imaging from the left breast, measuring approximately 10 cm in diameter. This was photographed today. The skin over the mass is largely ulcerated, with yellow to pink base. There is no overt bleeding.  LAB RESULTS: Lab Results  Component Value Date   WBC 6.2 05/24/2011   NEUTROABS 4.2 05/24/2011   HGB 9.0* 05/24/2011   HCT 29.1* 05/24/2011   MCV 66.8* 05/24/2011   PLT 596* 05/24/2011      Chemistry      Component Value Date/Time   NA 137 04/08/2011 1230   K 3.6 04/08/2011 1230   CL 101 04/08/2011 1230    CO2 28 04/08/2011 1230   BUN 7 04/08/2011 1230   CREATININE 0.81 04/08/2011 1230      Component Value Date/Time   CALCIUM 8.8 04/08/2011 1230   ALKPHOS 55 03/29/2011 1606   AST 24 03/29/2011 1606   ALT 11 03/29/2011 1606   BILITOT 0.2* 03/29/2011 1606       Lab Results  Component Value Date   LABCA2 16 03/29/2011    No components found with this basename: LABCA125    No results found for this basename: INR:1;PROTIME:1 in the last 168 hours  Urinalysis No results found for this basename: colorurine,  appearanceur,  labspec,  phurine,  glucoseu,  hgbur,  bilirubinur,  ketonesur,  proteinur,  urobilinogen,  nitrite,  leukocytesur       STUDIES: No new results found.  ASSESSMENT: 46 year-old [place] woman  with stage IV breast cancer, s/p Left breast biopsy 03/18/2011 showing a clinical T4,N1 invasive ductal carcinoma, high grade, triple negative, with an Mib-1 of 97%, with a single liver metastasis pathologically confirmed 03/31/2011  PLAN: Cecilia returns today to discuss further therapy of her left breast mass. She wondered if radiation alone would be sufficient. I do not think radiation in the absence of earlier systemic treatment would be advisable. It might make the possibility of eventual surgical removal are all for left breast impossible. Instead I suggested we go back to the original plan to start with chemotherapy to shrink the mass is much as possible, allowing [place] then to optimize local treatment, and then consider how to deal with her all of the metastatic disease, if there is any residual tumor in her liver.  Sodium was very distressed today because she understands chemotherapy may make her sterile. She would like to have more children. I agree that the chance of chemotherapy making her permanently several is high. I still think it is the best option for her if she wishes to have some control of her disease.  Her husband [name] is going to be out-of-town for some time. They are  thinking of possibly moving to [facility] to have better access to treatments here. We finally agreed that side I will start her chemotherapy here May 8. She still has all the prescriptions that I wrote for her previously, which she will fill out, and bring to her visit that day. She understands she needs to start the dexamethasone on May 7 spelled out in her instructions.  The plan accordingly is for 6 cycles of docetaxel/ cyclophosphamide/ doxorubicin. We have an echocardiogram from January to serve as baseline. She requested a medical node explaining that she has to lie in the back of the car and that sunlight can make it difficult for her to rest, so they can have their back windows tinted (they had to be untinted because of [place] law). I also wrote her a prescription for morphine 15 mg, to take 1 or 2 tablets as needed for pain, particularly at bedtime. I am hopeful this will help her gain better control of her pain. She understands that if she runs out of medication over the weekend we will not be able to fill it for her and therefore she should always checked late in the week to make sure she has enough to carry her through the weekend   Tredarius Cobern C    06/28/2011

## 2011-06-29 ENCOUNTER — Telehealth: Payer: Self-pay | Admitting: Oncology

## 2011-06-29 ENCOUNTER — Telehealth: Payer: Self-pay | Admitting: *Deleted

## 2011-06-29 NOTE — Telephone Encounter (Signed)
Per staff message from [place], I have scheduled the patient for 6 cycles of TAC starting on May8th. Appts made and Crystal notified.    JMW

## 2011-06-29 NOTE — Telephone Encounter (Signed)
S/w the pt and she is aware of her may 8th appt and she will pick up an appt calendar at that visit

## 2011-06-30 ENCOUNTER — Encounter: Payer: Self-pay | Admitting: Oncology

## 2011-06-30 NOTE — Progress Notes (Signed)
Patient received eight prescriptions from [place] [facility] on 06/28/11 $24.00,her remaninig balance [facility] $370.00 and [facility] $600.00.

## 2011-07-04 ENCOUNTER — Other Ambulatory Visit: Payer: Medicaid Other | Admitting: Lab

## 2011-07-04 ENCOUNTER — Encounter: Payer: Self-pay | Admitting: Psychiatry

## 2011-07-04 ENCOUNTER — Ambulatory Visit: Payer: Medicaid Other | Admitting: Physician Assistant

## 2011-07-11 ENCOUNTER — Encounter: Payer: Self-pay | Admitting: Psychiatry

## 2011-07-11 ENCOUNTER — Ambulatory Visit: Payer: Medicaid Other | Admitting: Psychiatry

## 2011-07-11 NOTE — Progress Notes (Addendum)
07-11-2011  Patient failed to show for today's initial psychological evaluation appointment.

## 2011-07-13 ENCOUNTER — Other Ambulatory Visit: Payer: Medicaid Other | Admitting: Lab

## 2011-07-13 ENCOUNTER — Telehealth: Payer: Self-pay | Admitting: Oncology

## 2011-07-13 ENCOUNTER — Ambulatory Visit: Payer: Medicaid Other

## 2011-07-13 ENCOUNTER — Ambulatory Visit: Payer: Medicaid Other | Admitting: Physician Assistant

## 2011-07-13 NOTE — Telephone Encounter (Signed)
lmonvm adviisng the pt of her next f/u appt on 07/20/2011

## 2011-07-13 NOTE — Progress Notes (Signed)
Per Orrin Brigham in scheduling, the patient contacted her this morning explaining that she was "just leaving [place]" and would be traveling all day. She wanted to reschedule today's appointments. She is already scheduled to see me next Wednesday, May 15, and we will schedule her first dose of TAC for the same day.  Crystal will adjust these appointments, and will contact the patient with dates and times.  Zollie Scale, PA-C 07/13/2011

## 2011-07-14 ENCOUNTER — Ambulatory Visit: Payer: Medicaid Other

## 2011-07-14 ENCOUNTER — Telehealth: Payer: Self-pay | Admitting: *Deleted

## 2011-07-14 NOTE — Telephone Encounter (Signed)
Per e-mail from [place], I have scheduled treatment appts for the patient. Jacki Cones aware appts in computer.  JW

## 2011-07-18 ENCOUNTER — Ambulatory Visit: Payer: Medicaid Other | Admitting: Physician Assistant

## 2011-07-18 ENCOUNTER — Other Ambulatory Visit: Payer: Medicaid Other

## 2011-07-19 ENCOUNTER — Ambulatory Visit: Payer: PRIVATE HEALTH INSURANCE

## 2011-07-20 ENCOUNTER — Ambulatory Visit (HOSPITAL_BASED_OUTPATIENT_CLINIC_OR_DEPARTMENT_OTHER): Payer: Medicaid Other | Admitting: Physician Assistant

## 2011-07-20 ENCOUNTER — Telehealth: Payer: Self-pay | Admitting: Oncology

## 2011-07-20 ENCOUNTER — Telehealth: Payer: Self-pay | Admitting: *Deleted

## 2011-07-20 ENCOUNTER — Other Ambulatory Visit (HOSPITAL_BASED_OUTPATIENT_CLINIC_OR_DEPARTMENT_OTHER): Payer: Medicaid Other | Admitting: Lab

## 2011-07-20 ENCOUNTER — Ambulatory Visit (HOSPITAL_BASED_OUTPATIENT_CLINIC_OR_DEPARTMENT_OTHER): Payer: Medicaid Other

## 2011-07-20 VITALS — BP 114/77 | HR 94 | Temp 98.2°F | Ht 65.0 in | Wt 158.9 lb

## 2011-07-20 DIAGNOSIS — C773 Secondary and unspecified malignant neoplasm of axilla and upper limb lymph nodes: Secondary | ICD-10-CM

## 2011-07-20 DIAGNOSIS — N6320 Unspecified lump in the left breast, unspecified quadrant: Secondary | ICD-10-CM

## 2011-07-20 DIAGNOSIS — C787 Secondary malignant neoplasm of liver and intrahepatic bile duct: Secondary | ICD-10-CM

## 2011-07-20 DIAGNOSIS — Z171 Estrogen receptor negative status [ER-]: Secondary | ICD-10-CM

## 2011-07-20 DIAGNOSIS — C50419 Malignant neoplasm of upper-outer quadrant of unspecified female breast: Secondary | ICD-10-CM

## 2011-07-20 DIAGNOSIS — Z5111 Encounter for antineoplastic chemotherapy: Secondary | ICD-10-CM

## 2011-07-20 DIAGNOSIS — C801 Malignant (primary) neoplasm, unspecified: Secondary | ICD-10-CM

## 2011-07-20 LAB — COMPREHENSIVE METABOLIC PANEL
Albumin: 3.8 g/dL (ref 3.5–5.2)
BUN: 10 mg/dL (ref 6–23)
Calcium: 9.1 mg/dL (ref 8.4–10.5)
Chloride: 102 mEq/L (ref 96–112)
Creatinine, Ser: 0.67 mg/dL (ref 0.50–1.10)
Glucose, Bld: 163 mg/dL — ABNORMAL HIGH (ref 70–99)
Potassium: 4.7 mEq/L (ref 3.5–5.3)

## 2011-07-20 LAB — CBC WITH DIFFERENTIAL/PLATELET
Basophils Absolute: 0 10*3/uL (ref 0.0–0.1)
Eosinophils Absolute: 0 10*3/uL (ref 0.0–0.5)
HCT: 27.3 % — ABNORMAL LOW (ref 34.8–46.6)
HGB: 8.5 g/dL — ABNORMAL LOW (ref 11.6–15.9)
MCV: 64.1 fL — ABNORMAL LOW (ref 79.5–101.0)
MONO%: 2.1 % (ref 0.0–14.0)
NEUT#: 6.3 10*3/uL (ref 1.5–6.5)
NEUT%: 90 % — ABNORMAL HIGH (ref 38.4–76.8)
RDW: 19.1 % — ABNORMAL HIGH (ref 11.2–14.5)
lymph#: 0.6 10*3/uL — ABNORMAL LOW (ref 0.9–3.3)

## 2011-07-20 MED ORDER — SODIUM CHLORIDE 0.9 % IV SOLN
150.0000 mg | Freq: Once | INTRAVENOUS | Status: AC
  Administered 2011-07-20: 150 mg via INTRAVENOUS
  Filled 2011-07-20: qty 5

## 2011-07-20 MED ORDER — PALONOSETRON HCL INJECTION 0.25 MG/5ML
0.2500 mg | Freq: Once | INTRAVENOUS | Status: AC
  Administered 2011-07-20: 0.25 mg via INTRAVENOUS

## 2011-07-20 MED ORDER — SODIUM CHLORIDE 0.9 % IJ SOLN
10.0000 mL | INTRAMUSCULAR | Status: DC | PRN
  Administered 2011-07-20: 10 mL
  Filled 2011-07-20: qty 10

## 2011-07-20 MED ORDER — SODIUM CHLORIDE 0.9 % IV SOLN
Freq: Once | INTRAVENOUS | Status: AC
  Administered 2011-07-20: 11:00:00 via INTRAVENOUS

## 2011-07-20 MED ORDER — DEXAMETHASONE SODIUM PHOSPHATE 4 MG/ML IJ SOLN
12.0000 mg | Freq: Once | INTRAMUSCULAR | Status: AC
  Administered 2011-07-20: 12 mg via INTRAVENOUS

## 2011-07-20 MED ORDER — HEPARIN SOD (PORK) LOCK FLUSH 100 UNIT/ML IV SOLN
500.0000 [IU] | Freq: Once | INTRAVENOUS | Status: AC | PRN
  Administered 2011-07-20: 500 [IU]
  Filled 2011-07-20: qty 5

## 2011-07-20 MED ORDER — DOCETAXEL CHEMO INJECTION 160 MG/16ML
75.0000 mg/m2 | Freq: Once | INTRAVENOUS | Status: AC
  Administered 2011-07-20: 140 mg via INTRAVENOUS
  Filled 2011-07-20: qty 14

## 2011-07-20 MED ORDER — SODIUM CHLORIDE 0.9 % IV SOLN
500.0000 mg/m2 | Freq: Once | INTRAVENOUS | Status: AC
  Administered 2011-07-20: 940 mg via INTRAVENOUS
  Filled 2011-07-20: qty 47

## 2011-07-20 MED ORDER — DOXORUBICIN HCL CHEMO IV INJECTION 2 MG/ML
50.0000 mg/m2 | Freq: Once | INTRAVENOUS | Status: AC
  Administered 2011-07-20: 94 mg via INTRAVENOUS
  Filled 2011-07-20: qty 47

## 2011-07-20 NOTE — Telephone Encounter (Signed)
Per staff message from [place], I have scheduled treatment appts. Appts in computer and Crystal aware.  JMW

## 2011-07-20 NOTE — Telephone Encounter (Signed)
gve the pt's husband the may-July 201`3 appt calendars

## 2011-07-20 NOTE — Patient Instructions (Signed)
[facility] Discharge Instructions for Patients Receiving Chemotherapy  Today you received the following chemotherapy agents  Taxotere, Cytoxan, Adriamycin To help prevent nausea and vomiting after your treatment, we encourage you to take your nausea medication as directed by your physician.   If you develop nausea and vomiting that is not controlled by your nausea medication, call the clinic. If it is after clinic hours your family physician or the after hours number for the clinic or go to the Emergency Department.   BELOW ARE SYMPTOMS THAT SHOULD BE REPORTED IMMEDIATELY:  *FEVER GREATER THAN 100.5 F  *CHILLS WITH OR WITHOUT FEVER  NAUSEA AND VOMITING THAT IS NOT CONTROLLED WITH YOUR NAUSEA MEDICATION  *UNUSUAL SHORTNESS OF BREATH  *UNUSUAL BRUISING OR BLEEDING  TENDERNESS IN MOUTH AND THROAT WITH OR WITHOUT PRESENCE OF ULCERS  *URINARY PROBLEMS  *BOWEL PROBLEMS  UNUSUAL RASH Items with * indicate a potential emergency and should be followed up as soon as possible.  One of the nurses will contact you 24 hours after your treatment. Please let the nurse know about any problems that you may have experienced. Feel free to call the clinic you have any questions or concerns. The clinic phone number is [phone].   I have been informed and understand all the instructions given to me. I know to contact the clinic, my physician, or go to the Emergency Department if any problems should occur. I do not have any questions at this time, but understand that I may call the clinic during office hours   should I have any questions or need assistance in obtaining follow up care.    __________________________________________  _____________  __________ Signature of Patient or Authorized Representative            Date                   Time    __________________________________________ Nurse's Signature

## 2011-07-21 ENCOUNTER — Encounter: Payer: Self-pay | Admitting: Physician Assistant

## 2011-07-21 ENCOUNTER — Ambulatory Visit: Payer: Medicaid Other

## 2011-07-21 ENCOUNTER — Encounter: Payer: Self-pay | Admitting: *Deleted

## 2011-07-21 ENCOUNTER — Ambulatory Visit (HOSPITAL_BASED_OUTPATIENT_CLINIC_OR_DEPARTMENT_OTHER): Payer: Medicaid Other

## 2011-07-21 ENCOUNTER — Encounter (INDEPENDENT_AMBULATORY_CARE_PROVIDER_SITE_OTHER): Payer: Medicaid Other | Admitting: Surgery

## 2011-07-21 ENCOUNTER — Telehealth: Payer: Self-pay | Admitting: Oncology

## 2011-07-21 VITALS — BP 98/63 | HR 85 | Temp 97.7°F

## 2011-07-21 DIAGNOSIS — C801 Malignant (primary) neoplasm, unspecified: Secondary | ICD-10-CM

## 2011-07-21 DIAGNOSIS — N6320 Unspecified lump in the left breast, unspecified quadrant: Secondary | ICD-10-CM

## 2011-07-21 DIAGNOSIS — C773 Secondary and unspecified malignant neoplasm of axilla and upper limb lymph nodes: Secondary | ICD-10-CM

## 2011-07-21 DIAGNOSIS — C50419 Malignant neoplasm of upper-outer quadrant of unspecified female breast: Secondary | ICD-10-CM

## 2011-07-21 DIAGNOSIS — C787 Secondary malignant neoplasm of liver and intrahepatic bile duct: Secondary | ICD-10-CM

## 2011-07-21 MED ORDER — PEGFILGRASTIM INJECTION 6 MG/0.6ML
6.0000 mg | Freq: Once | SUBCUTANEOUS | Status: AC
  Administered 2011-07-21: 6 mg via SUBCUTANEOUS
  Filled 2011-07-21: qty 0.6

## 2011-07-21 MED ORDER — OXYCODONE-ACETAMINOPHEN 5-325 MG PO TABS: ORAL_TABLET | ORAL | Status: DC

## 2011-07-21 NOTE — Telephone Encounter (Signed)
Pt came in today for schedule and was given schedule for may thru July. appts for 6/19 and 6/20 were cx'd due to they are out of sink. pts tx is q3w w/next tx being 6/5 then 6/26.

## 2011-07-21 NOTE — Progress Notes (Signed)
CHEMO FOLLOW UP- PT. HERE FOR AN INJECTION. SHE IS TIRED BUT NO OTHER PROBLEMS OR QUESTIONS AT THIS TIME. SHE WILL CALL THIS OFFICE IF THE NEED ARISES.

## 2011-07-21 NOTE — Patient Instructions (Signed)
Shellee Milo RN iscussed with patient the use of Claritin to relieve side effects from the Neulasta.  Patient only complain "tiredness" .  Patient to call [place] for any problems.

## 2011-07-21 NOTE — Progress Notes (Signed)
ID: Alyssa May   DOB: Jul 24, 1965  MR#: [id number]  WUJ#:[id number]  HISTORY OF PRESENT ILLNESS: Alyssa May is a 46 year-old [place] woman who first noted a small mass in her Left breast November 2011, shortly after she stopped breast-feeding. It did not disappear with resumption of her periods and grew over the next year, and particularly since she had her miscarrriage October 2012. This brought her to the Emergency Room 03/09/2011 where she was found to have a fluctuant area in the UOQ of the Left breast measuring 8.4 cm. The tissue beneath the cystic area extending towards the axilla and medially was described as firm, but not erythematous. The cystic area was lanced and a large amount of serosanguinos material issued.  The patient was referred to Dr. Marcille Blanco who saw her 03/11/2011. By this time the fluid had largely reaccumulated.[name] He incised the lesion, again draining serosanguinous liquid, packed it, and set the patient up [facility]. This showed an irregularly marginated mass measuring 7.3 cm, with abnormal axillary adenopathy. On 01/1102013 bilateral mammography confirmed a high-density mass in the Left UOQ. This was biopsied, as was a Left axillary lymph node. [place] biopsies ([id number]) showed invasive ductal carcinoma, high grade, triple negative, with an Mib-1 of 97%. Subsequent treatments are as detailed below  INTERVAL HISTORY: [name] returns today with her husband Alyssa May for followup of her locally advanced left breast carcinoma. The plan had been to initiate her chemotherapy last week, but there was a delay since the patient was out of town, and she is ready to initiate therapy as planned today. She will be treated with docetaxel/cyclophosphamide/doxorubicin, with today being day 1 of 6 planned q. three-week doses.  Dr. Darnelle Catalan has already prescribed Desare has antinausea regimen, and she has all of his medications on hand at home she tells me.  We again reviewed how to take these appropriately.   REVIEW OF SYSTEMS: [name] continues to have quite a bit of pain in the left breast for which she is currently taking oxycodone/APAP. The pain is often as high as a 10 on a scale of 1-10. She denies any fevers, chills, or night sweats. She's had no nausea and no change in bowel habits. Specifically, she's had no constipation despite narcotic pain meds. No cough, phlegm production, or increased shortness of breath. No pain other than in the breast as noted above. No peripheral swelling with the exception of apparent lymphedema in the left upper extremity.  A detailed review of systems is otherwise noncontributory.  PAST MEDICAL HISTORY: Past Medical History  Diagnosis Date  . Breast abscess   . No pertinent past medical history   . Cancer     lt br     PAST SURGICAL HISTORY: Past Surgical History  Procedure Date  . Dilation and curettage of uterus 01/04/11  . Breast mass excision 2013  . Tonsillectomy   . Portacath placement 04/12/2011    Procedure: INSERTION PORT-A-CATH;  Surgeon: Wilmon Arms. Tsuei, [place];  Location: [facility];  Service: General;  Laterality: Right;  right subclavian  . Incision and drainage breast abscess 03/31/11    FAMILY HISTORY Family History  Problem Relation Age of Onset  . Diabetes Mother   . Hypertension Mother   the patient's father died from post-operative complications at age 55; the patient's mother is 63, lives in [place] The patient is a single child  GYNECOLOGIC HISTORY: first pregnancy to term age 49. 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 area. Her husband Brittainy Bucker is a Hospital doctor for a Acupuncturist in [place]. Their daughter, Nechama May, is 3. The family stays part-time with the patient's mother in [place]    ADVANCED DIRECTIVES:  HEALTH MAINTENANCE: History  Substance Use Topics  .  Smoking status: Never Smoker   . Smokeless tobacco: Never Used  . Alcohol Use: Yes     occasional     Colonoscopy:  PAP:  Bone density:  Lipid panel:  Allergies  Allergen Reactions  . Sulfa Antibiotics Swelling    Current Outpatient Prescriptions  Medication Sig Dispense Refill  . capecitabine (XELODA) 500 MG tablet Take by mouth 2 (two) times daily after a meal.      . IRON PO Take 1 tablet by mouth daily.      . NON FORMULARY Take 2 tablets by mouth 3 (three) times daily.      [name] OVER THE COUNTER MEDICATION Take 1 tablet by mouth daily. Proactive Enzymes      . OVER THE COUNTER MEDICATION Take 1 tablet by mouth daily. protocel      . oxyCODONE-acetaminophen (PERCOCET) 5-325 MG per tablet 1-2 tab PO Q 6 Hr PRN pain  60 tablet  0   No current facility-administered medications for this visit.   Facility-Administered Medications Ordered in Other Visits  Medication Dose Route Frequency Provider Last Rate Last Dose  . pegfilgrastim (NEULASTA) injection 6 mg  6 mg Subcutaneous Once Lowella Dell, [place]   6 mg at 07/21/11 1410  . DISCONTD: sodium chloride 0.9 % injection 10 mL  10 mL Intracatheter PRN Lowella Dell, MD   10 mL at 07/20/11 1544    OBJECTIVE: Young African American woman who appears uncomfortable Filed Vitals:   07/20/11 1021  BP: 114/77  Pulse: 94  Temp: 98.2 F (36.8 C)     Body mass index is 26.44 kg/(m^2).    ECOG FS: 1  Filed Weights   07/20/11 1021  Weight: 158 lb 14.4 oz (72.077 kg)   Physical Exam: HEENT:  Sclerae anicteric, conjunctivae pink.  Oropharynx clear.     Nodes:  No cervical, supraclavicular, or axillary lymphadenopathy palpated.  Breast Exam:  The right breast is benign with no masses, skin changes, or nipple inversion. There is a clean dry dressing intact over the left breast which appears enlarged on exam.   Lungs:  Clear to auscultation bilaterally.  No crackles, rhonchi, or wheezes.   Heart:  Regular rate and rhythm.   Abdomen:   Soft, nontender.  Positive bowel sounds.  No organomegaly or masses palpated.   Musculoskeletal:  No focal spinal tenderness to palpation.  Extremities:  Benign.  No peripheral edema or cyanosis.   Skin:  Benign.   Neuro:  Nonfocal. Alert and oriented x3.    LAB RESULTS: Lab Results  Component Value Date   WBC 7.0 07/20/2011   NEUTROABS 6.3 07/20/2011   HGB 8.5* 07/20/2011   HCT 27.3* 07/20/2011   MCV 64.1* 07/20/2011   PLT 639* 07/20/2011      Chemistry      Component Value Date/Time   NA 136 07/20/2011 1005   K 4.7 07/20/2011 1005   CL 102 07/20/2011 1005   CO2 23 07/20/2011 1005   BUN 10 07/20/2011 1005   CREATININE 0.67 07/20/2011 1005      Component Value Date/Time   CALCIUM 9.1  07/20/2011 1005   ALKPHOS 53 07/20/2011 1005   AST 30 07/20/2011 1005   ALT 17 07/20/2011 1005   BILITOT 0.3 07/20/2011 1005       Lab Results  Component Value Date   LABCA2 16 03/29/2011     STUDIES: No new results found.  ASSESSMENT: 46 year-old [place] woman with stage IV breast cancer, s/p Left breast biopsy 03/18/2011 showing a clinical T4,N1 invasive ductal carcinoma, high grade, triple negative, with an Mib-1 of 97%, with a single liver metastasis pathologically confirmed 03/31/2011  PLAN:  Yanilen will proceed today for her first cycle of neoadjuvant docetaxel/cyclophosphamide/doxorubicin. She will receive Neulasta tomorrow for granulocyte support. I have refilled her oxycodone/APAP for the pain in the left breast.  Jaquayla will see me again for assessment of chemotoxicity's next week on may 22nd. We are tentatively scheduling her for a barium swallow test or early the next week, but if those problems are improving when I see her next, we will likely cancel that appointment.   The plan accordingly is for 6 cycles of docetaxel/ cyclophosphamide/ doxorubicin. We have an echocardiogram from January to serve as baseline. She has all of her anti-emetics on hand at home including dexamethasone,  prochlorperazine, and lorazepam. She and her husband Alyssa May both voice understanding and agreement and will call with any changes or problems.  Marquist Binstock    07/21/2011

## 2011-07-22 ENCOUNTER — Encounter (INDEPENDENT_AMBULATORY_CARE_PROVIDER_SITE_OTHER): Payer: Medicaid Other | Admitting: Surgery

## 2011-07-22 ENCOUNTER — Ambulatory Visit: Payer: Medicaid Other

## 2011-07-24 ENCOUNTER — Emergency Department (HOSPITAL_COMMUNITY)
Admission: EM | Admit: 2011-07-24 | Discharge: 2011-07-25 | Payer: Medicaid Other | Attending: Emergency Medicine | Admitting: Emergency Medicine

## 2011-07-24 ENCOUNTER — Encounter (HOSPITAL_COMMUNITY): Payer: Self-pay

## 2011-07-24 DIAGNOSIS — R070 Pain in throat: Secondary | ICD-10-CM | POA: Insufficient documentation

## 2011-07-24 DIAGNOSIS — R630 Anorexia: Secondary | ICD-10-CM | POA: Insufficient documentation

## 2011-07-24 DIAGNOSIS — Z853 Personal history of malignant neoplasm of breast: Secondary | ICD-10-CM | POA: Insufficient documentation

## 2011-07-24 DIAGNOSIS — B3781 Candidal esophagitis: Secondary | ICD-10-CM | POA: Insufficient documentation

## 2011-07-24 DIAGNOSIS — Z79899 Other long term (current) drug therapy: Secondary | ICD-10-CM | POA: Insufficient documentation

## 2011-07-24 DIAGNOSIS — B370 Candidal stomatitis: Secondary | ICD-10-CM

## 2011-07-24 LAB — CBC
Platelets: 339 10*3/uL (ref 150–400)
RDW: 19 % — ABNORMAL HIGH (ref 11.5–15.5)
WBC: 10.9 10*3/uL — ABNORMAL HIGH (ref 4.0–10.5)

## 2011-07-24 LAB — DIFFERENTIAL
Basophils Relative: 0 % (ref 0–1)
Eosinophils Absolute: 0 10*3/uL (ref 0.0–0.7)
Lymphocytes Relative: 5 % — ABNORMAL LOW (ref 12–46)
Lymphs Abs: 0.5 10*3/uL — ABNORMAL LOW (ref 0.7–4.0)
Monocytes Absolute: 0.1 10*3/uL (ref 0.1–1.0)
Monocytes Relative: 1 % — ABNORMAL LOW (ref 3–12)
Neutro Abs: 10.3 10*3/uL — ABNORMAL HIGH (ref 1.7–7.7)

## 2011-07-24 LAB — BASIC METABOLIC PANEL
BUN: 17 mg/dL (ref 6–23)
CO2: 27 mEq/L (ref 19–32)
Calcium: 8.2 mg/dL — ABNORMAL LOW (ref 8.4–10.5)
GFR calc non Af Amer: >90 mL/min (ref >90)
Glucose, Bld: 105 mg/dL — ABNORMAL HIGH (ref 70–99)
Sodium: 134 mEq/L — ABNORMAL LOW (ref 135–145)

## 2011-07-24 MED ORDER — SODIUM CHLORIDE 0.9 % IV BOLUS (SEPSIS): 1000.0000 mL | Freq: Once | INTRAVENOUS | Status: DC

## 2011-07-24 MED ORDER — SODIUM CHLORIDE 0.9 % IV BOLUS (SEPSIS)
1000.0000 mL | Freq: Once | INTRAVENOUS | Status: AC
  Administered 2011-07-24: 1000 mL via INTRAVENOUS

## 2011-07-24 MED ORDER — MORPHINE SULFATE 4 MG/ML IJ SOLN: 4.0000 mg | Freq: Once | INTRAMUSCULAR | Status: DC

## 2011-07-24 MED ORDER — ONDANSETRON HCL 4 MG/2ML IJ SOLN
4.0000 mg | Freq: Once | INTRAMUSCULAR | Status: AC
  Administered 2011-07-24: 4 mg via INTRAVENOUS
  Filled 2011-07-24 (×2): qty 2

## 2011-07-24 MED ORDER — SODIUM CHLORIDE 0.9 % IJ SOLN
10.0000 mL | INTRAMUSCULAR | Status: DC | PRN
  Administered 2011-07-24: 10 mL

## 2011-07-24 MED ORDER — IBUPROFEN 200 MG PO TABS: 600.0000 mg | ORAL_TABLET | Freq: Once | ORAL | Status: DC

## 2011-07-24 MED ORDER — FLUCONAZOLE 200 MG PO TABS
200.0000 mg | ORAL_TABLET | Freq: Once | ORAL | Status: AC
  Administered 2011-07-24: 200 mg via ORAL
  Filled 2011-07-24: qty 1

## 2011-07-24 MED ORDER — HYDROMORPHONE HCL PF 1 MG/ML IJ SOLN
0.5000 mg | Freq: Once | INTRAMUSCULAR | Status: AC
  Administered 2011-07-24: 23:00:00 via INTRAVENOUS
  Filled 2011-07-24: qty 1

## 2011-07-24 MED ORDER — POTASSIUM CHLORIDE CRYS ER 20 MEQ PO TBCR
20.0000 meq | EXTENDED_RELEASE_TABLET | Freq: Once | ORAL | Status: AC
  Administered 2011-07-24: 20 meq via ORAL
  Filled 2011-07-24: qty 1

## 2011-07-24 MED ORDER — ALTEPLASE 2 MG IJ SOLR
2.0000 mg | Freq: Once | INTRAMUSCULAR | Status: AC
  Administered 2011-07-24: 2 mg
  Filled 2011-07-24: qty 2

## 2011-07-24 MED ORDER — MAGIC MOUTHWASH
10.0000 mL | Freq: Once | ORAL | Status: AC
  Administered 2011-07-24: 10 mL via ORAL
  Filled 2011-07-24: qty 10

## 2011-07-24 MED ORDER — ONDANSETRON HCL 4 MG/2ML IJ SOLN: 4.0000 mg | Freq: Once | INTRAMUSCULAR | Status: DC

## 2011-07-24 NOTE — ED Provider Notes (Signed)
History     CSN: [id number]  Arrival date & time 07/24/11  1857   First MD Initiated Contact with Patient 07/24/11 1951      Chief Complaint  Patient presents with  . Thrush    (Consider location/radiation/quality/duration/timing/severity/associated sxs/prior treatment) The history is provided by the patient.  pt c/o mouth and throat/esophagus pain for past couple weeks. States painful to swallow, and as a result poor po intake for past couple days. Had chemo for first time last week for breast ca. Denies fever or chills. No sinus drainage, cough or other uri c/o. No hx thrush. States had seen her oncologist for same symptoms, had plan for barium swallow to eval next week, but apparently no oral thrush lesions visible then.   Past Medical History  Diagnosis Date  . Breast abscess   . No pertinent past medical history   . Cancer     lt br     Past Surgical History  Procedure Date  . Dilation and curettage of uterus 01/04/11  . Breast mass excision 2013  . Tonsillectomy   . Portacath placement 04/12/2011    Procedure: INSERTION PORT-A-CATH;  Surgeon: Wilmon Arms. Tsuei, [place];  Location: [facility];  Service: General;  Laterality: Right;  right subclavian  . Incision and drainage breast abscess 03/31/11    Family History  Problem Relation Age of Onset  . Diabetes Mother   . Hypertension Mother     History  Substance Use Topics  . Smoking status: Never Smoker   . Smokeless tobacco: Never Used  . Alcohol Use: Yes     occasional    OB History    Grav Para Term Preterm Abortions TAB SAB Ect Mult Living   6    6 3 3   1       Review of Systems  Constitutional: Negative for fever and chills.  HENT: Negative for neck pain.   Respiratory: Negative for cough and shortness of breath.   Cardiovascular: Negative for chest pain.  Gastrointestinal: Negative for abdominal pain.  Genitourinary: Negative for flank pain.  Skin: Negative for rash.  Neurological: Negative for headaches.      Allergies  Sulfa antibiotics  Home Medications   Current Outpatient Rx  Name Route Sig Dispense Refill  . CYCLOPHOSPHAMIDE CHEMO INJECTION 2 GM Intravenous Inject 940 mg into the vein See admin instructions. Every 3 weeks. Dr. Darnelle Catalan MD    . DEXAMETHASONE 4 MG PO TABS Oral Take 4 mg by mouth 2 (two) times daily with a meal. As directed. Start 1 day befor and 5 days after    . TAXOTERE IV Intravenous Inject 140 mg into the vein See admin instructions. Every 3 weeks. Dr. Darnelle Catalan MD    . ADRIAMYCIN IV Intravenous Inject 94 mg into the vein See admin instructions. Every 3 weeks. Dr. Darnelle Catalan MD    . LIDOCAINE-PRILOCAINE 2.5-2.5 % EX CREA Topical Apply 1 application topically as needed. To port    . LORATADINE 10 MG PO TABS Oral Take 10 mg by mouth daily.    [name] LORAZEPAM 0.5 MG PO TABS Oral Take 0.5 mg by mouth at bedtime as needed.    . MORPHINE SULFATE 15 MG PO TABS Oral Take 15-30 mg by mouth at bedtime as needed. For pain    . OXYCODONE-ACETAMINOPHEN 5-325 MG PO TABS  1-2 tab PO Q 6 Hr PRN pain 60 tablet 0  . PROCHLORPERAZINE MALEATE 10 MG PO TABS Oral Take 10 mg by mouth every  6 (six) hours as needed. For nausea    . TOBRAMYCIN-DEXAMETHASONE 0.3-0.1 % OP OINT Both Eyes Place 1 application into both eyes 2 (two) times daily.      BP 101/61  Pulse 97  Temp(Src) 98.4 F (36.9 C) (Oral)  Resp 18  Ht 5\' 5"  (1.651 m)  Wt 155 lb (70.308 kg)  BMI 25.79 kg/m2  SpO2 99%  Physical Exam  Nursing note and vitals reviewed. Constitutional: She appears well-developed and well-nourished. No distress.  HENT:  Nose: Nose normal.       Oral thrush  Eyes: Conjunctivae are normal. No scleral icterus.  Neck: Neck supple. No tracheal deviation present.  Cardiovascular: Normal rate.   Pulmonary/Chest: Effort normal. No respiratory distress.  Abdominal: Soft. Normal appearance. She exhibits no distension. There is no tenderness.  Musculoskeletal: She exhibits no edema.  Lymphadenopathy:     She has no cervical adenopathy.  Neurological: She is alert.  Skin: Skin is warm and dry. No rash noted.  Psychiatric: She has a normal mood and affect.    ED Course  Procedures (including critical care time)   Labs Reviewed  CBC  DIFFERENTIAL  BASIC METABOLIC PANEL    Results for orders placed during the hospital encounter of 07/24/11  CBC      Component Value Range   WBC 10.9 (*) 4.0 - 10.5 (K/uL)   RBC 4.33  3.87 - 5.11 (MIL/uL)   Hemoglobin 8.6 (*) 12.0 - 15.0 (g/dL)   HCT 52.8 (*) 41.3 - 46.0 (%)   MCV 64.9 (*) 78.0 - 100.0 (fL)   MCH 19.9 (*) 26.0 - 34.0 (pg)   MCHC 30.6  30.0 - 36.0 (g/dL)   RDW 24.4 (*) 01.0 - 15.5 (%)   Platelets 339  150 - 400 (K/uL)  DIFFERENTIAL      Component Value Range   Neutrophils Relative PENDING  43 - 77 (%)   Neutro Abs PENDING  1.7 - 7.7 (K/uL)   Band Neutrophils PENDING  0 - 10 (%)   Lymphocytes Relative PENDING  12 - 46 (%)   Lymphs Abs PENDING  0.7 - 4.0 (K/uL)   Monocytes Relative PENDING  3 - 12 (%)   Monocytes Absolute PENDING  0.1 - 1.0 (K/uL)   Eosinophils Relative PENDING  0 - 5 (%)   Eosinophils Absolute PENDING  0.0 - 0.7 (K/uL)   Basophils Relative PENDING  0 - 1 (%)   Basophils Absolute PENDING  0.0 - 0.1 (K/uL)   WBC Morphology PENDING     RBC Morphology PENDING     Smear Review PENDING     nRBC PENDING  0 (/100 WBC)   Metamyelocytes Relative PENDING     Myelocytes PENDING     Promyelocytes Absolute PENDING     Blasts PENDING    BASIC METABOLIC PANEL      Component Value Range   Sodium 134 (*) 135 - 145 (mEq/L)   Potassium 3.4 (*) 3.5 - 5.1 (mEq/L)   Chloride 98  96 - 112 (mEq/L)   CO2 27  19 - 32 (mEq/L)   Glucose, Bld 105 (*) 70 - 99 (mg/dL)   BUN 17  6 - 23 (mg/dL)   Creatinine, Ser 2.72  0.50 - 1.10 (mg/dL)   Calcium 8.2 (*) 8.4 - 10.5 (mg/dL)   GFR calc non Af Amer >90  >90 (mL/min)   GFR calc Af Amer >90  >90 (mL/min)      MDM  Iv ns bolus. zofran  iv. Dilaudid iv. Magic mouthwash po.  Fluconazole po.  Discussed w pts oncology on call md.   Reviewed nursing notes and prior charts for additional history.   2nd liter ns. kcl po.   Discussed pt with Dr Arline Asp, he states place on fluconazole and they will follow up in clinic this week.           Suzi Roots, MD 07/24/11 2151

## 2011-07-24 NOTE — ED Notes (Signed)
Pt in from home states difficulty swallowing states she has thrush states last chem tx was Wednesday pt has a hx  Of breast cancer left air way is intact denies trouble breathing

## 2011-07-25 MED ORDER — FLUCONAZOLE 200 MG PO TABS: 200.0000 mg | ORAL_TABLET | Freq: Every day | ORAL | Status: DC

## 2011-07-25 NOTE — ED Notes (Signed)
Patient discharge via ambulatory with steady gait. Respirations equal and unlabored. Skin warm and dry. No acute distress noted. 

## 2011-07-25 NOTE — Discharge Instructions (Signed)
Take fluconazole as prescribed. Take tylenol/advil as need. May try maalox or mylanta liquid for symptom relief. Drink plenty of fluids. Follow up with your oncologist this week as planned - it appears as though you may not need to have the barium swallow, discuss with your doctors at follow up.  Your potassium level is slightly low (3.4) - eat plenty of fruits and vegetables, and follow up with your doctor for recheck in 1-2 weeks.      Thrush, Adult  Alyssa May is a yeast infection that develops in the mouth and throat and on the tongue. The medical term for this is oropharyngeal candidiasis, or OPC. Alyssa May is most common in older adults, but it can occur at any age. Alyssa May occurs when a yeast called candida grows out of control. Candida normally is present in small amounts in the mouth and on other mucous membranes. However, under certain circumstances, candida can grow rapidly, causing thrush. Alyssa May can be a recurring problem for people who have chronic illnesses or who take medications that limit the body's ability to fight infection (weakened immune system). Since these people have difficulty fighting infections, the fungus that causes thrush can spread throughout the body. This can cause life-threatening blood or organ infections. CAUSES  Candida, the yeast that causes thrush, is normally present in small amounts in the mouth and on other mucous membranes. It usually causes no harm. However, when conditions are present that allow the yeast to grow uncontrolled, it invades surrounding tissues and becomes an infection. Alyssa May is most commonly caused by the yeast Candida albicans. Less often, other forms of candida can lead to thrush. There are many types of bacteria in your mouth that normally control the growth of candida. Sometimes a new type of bacteria gets into your mouth and disrupts the balance of the germs already there. This can allow candida to overgrow. Other factors that increase your risk  of developing thrush include:  An impaired ability to fight infection (weakened immune system). A normal immune system is usually strong enough to prevent candida from overgrowing.   Older adults are more likely to develop thrush because they may have weaker immune systems.   People with human immunodeficiency virus (HIV) infection have a high likelihood of developing thrush. About 90% of people with HIV develop thrush at some point during the course of their disease.   People with diabetes are more likely to get thrush because high blood sugar levels promote overgrowth of the candida fungus.   A dry mouth (xerostomia). Dry mouth can result from overuse of mouthwashes or from certain conditions such as Sjgren's syndrome.   Pregnancy. Hormone changes during pregnancy can lead to thrush by altering the balance of bacteria in the mouth.   Poor dental care, especially in people who have false teeth.   The use of antibiotic medications. This may lead to thrush by changing the balance of bacteria in the mouth.  SYMPTOMS  Thrush can be a mild infection that causes no symptoms. If symptoms develop, they may include the following:  A burning feeling in the mouth and throat. This can occur at the start of a thrush infection.   White patches that adhere to the mouth and tongue. The tissue around the patches may be red, raw, and painful. If rubbed (during tooth brushing, for example), the patches and the tissue of the mouth may bleed easily.   A bad taste in the mouth or difficulty tasting foods.   Cottony feeling in the  mouth.   Sometimes pain during eating and swallowing.  DIAGNOSIS  Your caregiver can usually diagnose thrush by exam. In addition to looking in your mouth, your caregiver will ask you questions about your health. TREATMENT  Medications that help prevent the growth of fungi (antifungals) are the standard treatment for thrush. These medications are either applied directly to the  affected area (topical) or swallowed (oral). Mild thrush In adults, mild cases of thrush may clear up with simple treatment that can be done at home. This treatment usually involves using an antifungal mouth rinse or lozenges. Treatment usually lasts about 14 days. Moderate to severe thrush  More severe thrush infections that have spread to the esophagus are treated with an oral antifungal medication. A topical antifungal medication may also be used.   For some severe infections, a treatment period longer than 14 days may be needed.   Oral antifungal medications are almost never used during pregnancy because the fetus may be harmed. However, if a pregnant woman has a rare, severe thrush infection that has spread to her blood, oral antifungal medications may be used. In this case, the risk of harm to the mother and fetus from the severe thrush infection may be greater than the risk posed by the use of antifungal medications.  Persistent or recurrent thrush Persistent (does not go away) or recurrent (keeps coming back) cases of thrush may:  Need to be treated twice as long as the symptoms last.   Require treatment with both oral and topical antifungal medications.   People with weakened immune systems can take an antifungal medication on a continuous basis to prevent thrush infections.  It is important to treat conditions that make you more likely to get thrush, such as diabetes, human immunodeficiency virus (HIV), or cancer.  HOME CARE INSTRUCTIONS   If you are breast-feeding, you should clean your nipples with an antifungal medication, such as nystatin (Mycostatin). Dry your nipples after breast-feeding. Applying lanolin-containing body lotion may help relieve nipple soreness.   If you wear dentures and get thrush, remove dentures before going to bed, brush them vigorously, and soak in a solution of chlorhexidine gluconate or a product such as Polident or Efferdent.   Eating plain,  unflavored yogurt that contains live cultures (check the label) can also help cure thrush. Yogurt helps healthy bacteria grow in the mouth. These bacteria stop the growth of the yeast that causes thrush.   Adults can treat thrush at home with gentian violet (1%), a dye that kills bacteria and fungi. It is available without a prescription. If there is no known cause for the infection or if gentian violet does not cure the thrush, you need to see your caregiver.  Comfort measures Measures can be taken to reduce the discomfort of thrush:  Drink cold liquids such as water or iced tea. Eat flavored ice treats or frozen juices.   Eat foods that are easy to swallow such as gelatin, ice cream, or custard.   If the patches are painful, try drinking from a straw.   Rinse your mouth several times a day with a warm saltwater rinse. You can make the saltwater mixture with 1 tsp (5 g) of salt in 8 fl oz (0.2 L) of warm water.  PROGNOSIS   Most cases of thrush are mild and clear up with the use of an antifungal mouth rinse or lozenges. Very mild cases of thrush may clear up without medical treatment. It usually takes about 14 days of  treatment with an oral antifungal medication to cure more severe thrush infections. In some cases, thrush may last several weeks even with treatment.   If thrush goes untreated and does not go away by itself, it can spread to other parts of the body.   Thrush can spread to the throat, the vagina, or the skin. It rarely spreads to other organs of the body.  Alyssa May is more likely to recur (come back) in:  People who use inhaled corticosteroids to treat asthma.   People who take antibiotic medications for a long time.   People who have false teeth.   People who have a weakened immune system.  RISKS AND COMPLICATIONS Complications related to thrush are rare in healthy people. There are several factors that can increase your risk of developing thrush. Age Older adults,  especially those who have serious health problems, are more likely to develop thrush because their immune systems are likely to be weaker. Behavior  The yeast that causes thrush can be spread by oral sex.   Heavy smoking can lower the body's ability to fight off infections. This makes thrush more likely to develop.  Other conditions  False teeth (dentures), braces, or a retainer that irritates the mouth make it hard to keep the mouth clean. An unclean mouth is more likely to develop thrush than a clean mouth.   People with a weakened immune system, such as those who have diabetes or human immunodeficiency virus (HIV) or who are undergoing chemotherapy, have an increased risk for developing thrush.  Medications Some medications can allow the fungus that causes thrush to grow uncontrolled. Common ones are:  Antibiotics, especially those that kill a wide range of organisms (broad-spectrum antibiotics), such as tetracycline commonly can cause thrush.   Birth control pills (oral contraceptives).   Medications that weaken the body's immune system, such as corticosteroids.  Environment Exposure over time to certain environmental chemicals, such as benzene and pesticides, can weaken the body's immune system. This increases your risk for developing infections, including thrush. SEEK IMMEDIATE MEDICAL CARE IF:  Your symptoms are getting worse or are not improving within 7 days of starting treatment.   You have symptoms of spreading infection, such as white patches on the skin outside of the mouth.   You are nursing and you have redness and pain in the nipples in spite of home treatment or if you have burning pain in the nipple area when you nurse. Your baby's mouth should also be examined to determine whether thrush is causing your symptoms.  Document Released: 11/17/2003 Document Revised: 02/10/2011 Document Reviewed: 02/27/2008 [facility] Patient Information 2012 [place],  [place].     Hypokalemia Hypokalemia means a low potassium level in the blood.Potassium is an electrolyte that helps regulate the amount of fluid in the body. It also stimulates muscle contraction and maintains a stable acid-base balance.Most of the body's potassium is inside of cells, and only a very small amount is in the blood. Because the amount in the blood is so small, minor changes can have big effects. PREPARATION FOR TEST Testing for potassium requires taking a blood sample taken by needle from a vein in the arm. The skin is cleaned thoroughly before the sample is drawn. There is no other special preparation needed. NORMAL VALUES Potassium levels below 3.5 mEq/L are abnormally low. Levels above 5.1 mEq/L are abnormally high. Ranges for normal findings may vary among different laboratories and hospitals. You should always check with your doctor after having lab work  or other tests done to discuss the meaning of your test results and whether your values are considered within normal limits. MEANING OF TEST  Your caregiver will go over the test results with you and discuss the importance and meaning of your results, as well as treatment options and the need for additional tests, if necessary. A potassium level is frequently part of a routine medical exam. It is usually included as part of a whole "panel" of tests for several blood salts (such as Sodium and Chloride). It may be done as part of follow-up when a low potassium level was found in the past or other blood salts are suspected of being out of balance. A low potassium level might be suspected if you have one or more of the following:  Symptoms of weakness.   Abnormal heart rhythms.   High blood pressure and are taking medication to control this, especially water pills (diuretics).   Kidney disease that can affect your potassium level .   Diabetes requiring the use of insulin. The potassium may fall after taking insulin, especially  if the diabetes had been out of control for a while.   A condition requiring the use of cortisone-type medication or certain types of antibiotics.   Vomiting and/or diarrhea for more than a day or two.   A stomach or intestinal condition that may not permit appropriate absorption of potassium.   Fainting episodes.   Mental confusion.  OBTAINING TEST RESULTS It is your responsibility to obtain your test results. Ask the lab or department performing the test when and how you will get your results.  Please contact your caregiver directly if you have not received the results within one week. At that time, ask if there is anything different or new you should be doing in relation to the results. TREATMENT Hypokalemia can be treated with potassium supplements taken by mouth and/or adjustments in your current medications. A diet high in potassium is also helpful. Foods with high potassium content are:  Peas, lentils, lima beans, nuts, and dried fruit.   Whole grain and bran cereals and breads.   Fresh fruit, vegetables (bananas, cantaloupe, grapefruit, oranges, tomatoes, honeydew melons, potatoes).   Orange and tomato juices.   Meats. If potassium supplement has been prescribed for you today or your medications have been adjusted, see your personal caregiver in time02 for a re-check.  SEEK MEDICAL CARE IF:  There is a feeling of worsening weakness.   You experience repeated chest palpitations.   You are diabetic and having difficulty keeping your blood sugars in the normal range.   You are experiencing vomiting and/or diarrhea.   You are having difficulty with any of your regular medications.  SEEK IMMEDIATE MEDICAL CARE IF:  You experience chest pain, shortness of breath, or episodes of dizziness.   You have been having vomiting or diarrhea for more than 2 days.   You have a fainting episode.  MAKE SURE YOU:   Understand these instructions.   Will watch your condition.    Will get help right away if you are not doing well or get worse.  Document Released: 02/21/2005 Document Revised: 02/10/2011 Document Reviewed: 02/02/2008 [facility] Patient Information 2012 [place], [place].

## 2011-07-25 NOTE — ED Notes (Signed)
Remove TPA from port/ still only minimal blood return but flushes easily.  Flush with 10ml ns and 500 units heparin and remove HPN from [facility] for pt discharge.  Mardene Speak, RN  IV Therapy

## 2011-07-26 ENCOUNTER — Inpatient Hospital Stay (HOSPITAL_COMMUNITY): Payer: Medicaid Other

## 2011-07-26 ENCOUNTER — Ambulatory Visit (HOSPITAL_BASED_OUTPATIENT_CLINIC_OR_DEPARTMENT_OTHER): Payer: Medicaid Other | Admitting: Physician Assistant

## 2011-07-26 ENCOUNTER — Inpatient Hospital Stay (HOSPITAL_COMMUNITY)
Admission: AD | Admit: 2011-07-26 | Discharge: 2011-07-29 | DRG: 809 | Disposition: A | Discharge status: Home / Self Care | Payer: Medicaid Other | Source: Ambulatory Visit | Attending: Oncology | Admitting: Oncology

## 2011-07-26 ENCOUNTER — Other Ambulatory Visit: Payer: Self-pay | Admitting: *Deleted

## 2011-07-26 ENCOUNTER — Ambulatory Visit (HOSPITAL_BASED_OUTPATIENT_CLINIC_OR_DEPARTMENT_OTHER): Payer: Medicaid Other | Admitting: Lab

## 2011-07-26 ENCOUNTER — Encounter (HOSPITAL_COMMUNITY): Payer: Self-pay

## 2011-07-26 ENCOUNTER — Telehealth: Payer: Self-pay | Admitting: *Deleted

## 2011-07-26 VITALS — BP 103/69 | HR 99 | Temp 99.8°F | Ht 65.0 in | Wt 157.9 lb

## 2011-07-26 DIAGNOSIS — C50419 Malignant neoplasm of upper-outer quadrant of unspecified female breast: Secondary | ICD-10-CM | POA: Diagnosis present

## 2011-07-26 DIAGNOSIS — N6320 Unspecified lump in the left breast, unspecified quadrant: Secondary | ICD-10-CM

## 2011-07-26 DIAGNOSIS — C787 Secondary malignant neoplasm of liver and intrahepatic bile duct: Secondary | ICD-10-CM | POA: Diagnosis present

## 2011-07-26 DIAGNOSIS — D709 Neutropenia, unspecified: Principal | ICD-10-CM | POA: Diagnosis present

## 2011-07-26 DIAGNOSIS — R5081 Fever presenting with conditions classified elsewhere: Secondary | ICD-10-CM | POA: Diagnosis present

## 2011-07-26 DIAGNOSIS — E46 Unspecified protein-calorie malnutrition: Secondary | ICD-10-CM | POA: Diagnosis present

## 2011-07-26 DIAGNOSIS — E871 Hypo-osmolality and hyponatremia: Secondary | ICD-10-CM | POA: Diagnosis present

## 2011-07-26 DIAGNOSIS — D649 Anemia, unspecified: Secondary | ICD-10-CM | POA: Diagnosis present

## 2011-07-26 LAB — CBC WITH DIFFERENTIAL/PLATELET
Basophils Absolute: 0 10*3/uL (ref 0.0–0.1)
EOS%: 8.1 % — ABNORMAL HIGH (ref 0.0–7.0)
Eosinophils Absolute: 0 10*3/uL (ref 0.0–0.5)
HGB: 7.9 g/dL — ABNORMAL LOW (ref 11.6–15.9)
LYMPH%: 73 % — ABNORMAL HIGH (ref 14.0–49.7)
MCH: 20.1 pg — ABNORMAL LOW (ref 25.1–34.0)
MCV: 64.2 fL — ABNORMAL LOW (ref 79.5–101.0)
MONO%: 2.7 % (ref 0.0–14.0)
NEUT#: 0.1 10*3/uL — CL (ref 1.5–6.5)
Platelets: 231 10*3/uL (ref 145–400)
RBC: 3.94 10*6/uL (ref 3.70–5.45)

## 2011-07-26 LAB — DIFFERENTIAL
Basophils Absolute: 0 10*3/uL (ref 0.0–0.1)
Eosinophils Absolute: 0 10*3/uL (ref 0.0–0.7)
Lymphocytes Relative: 0 % — ABNORMAL LOW (ref 12–46)
Monocytes Absolute: 0 10*3/uL — ABNORMAL LOW (ref 0.1–1.0)
Monocytes Relative: 0 % — ABNORMAL LOW (ref 3–12)
Neutrophils Relative %: 0 % — ABNORMAL LOW (ref 43–77)

## 2011-07-26 LAB — CBC
HCT: 22.5 % — ABNORMAL LOW (ref 36.0–46.0)
MCH: 20.1 pg — ABNORMAL LOW (ref 26.0–34.0)
MCV: 63.6 fL — ABNORMAL LOW (ref 78.0–100.0)
Platelets: 191 10*3/uL (ref 150–400)
RBC: 3.54 MIL/uL — ABNORMAL LOW (ref 3.87–5.11)
WBC: 0.3 10*3/uL — CL (ref 4.0–10.5)

## 2011-07-26 LAB — COMPREHENSIVE METABOLIC PANEL
BUN: 6 mg/dL (ref 6–23)
CO2: 26 mEq/L (ref 19–32)
Calcium: 7.9 mg/dL — ABNORMAL LOW (ref 8.4–10.5)
Chloride: 92 mEq/L — ABNORMAL LOW (ref 96–112)
Creatinine, Ser: 0.5 mg/dL (ref 0.50–1.10)
GFR calc Af Amer: >90 mL/min (ref >90)
GFR calc non Af Amer: >90 mL/min (ref >90)
Total Bilirubin: 0.4 mg/dL (ref 0.3–1.2)

## 2011-07-26 MED ORDER — PROMETHAZINE HCL 25 MG PO TABS
12.5000 mg | ORAL_TABLET | Freq: Four times a day (QID) | ORAL | Status: DC | PRN
  Administered 2011-07-26: 12.5 mg via ORAL
  Filled 2011-07-26: qty 1

## 2011-07-26 MED ORDER — LIDOCAINE-PRILOCAINE 2.5-2.5 % EX CREA
TOPICAL_CREAM | CUTANEOUS | Status: DC | PRN
  Filled 2011-07-26: qty 5

## 2011-07-26 MED ORDER — MORPHINE BOLUS VIA INFUSION: 2.0000 mg | INTRAVENOUS | Status: DC | PRN

## 2011-07-26 MED ORDER — PROMETHAZINE HCL 25 MG PO TABS
25.0000 mg | ORAL_TABLET | Freq: Four times a day (QID) | ORAL | Status: DC | PRN
  Administered 2011-07-28: 25 mg via ORAL
  Filled 2011-07-26: qty 1

## 2011-07-26 MED ORDER — MORPHINE SULFATE 2 MG/ML IJ SOLN
2.0000 mg | INTRAMUSCULAR | Status: DC | PRN
  Administered 2011-07-26 – 2011-07-27 (×2): 2 mg via INTRAVENOUS
  Administered 2011-07-27: 4 mg via INTRAVENOUS
  Administered 2011-07-27: 2 mg via INTRAVENOUS
  Filled 2011-07-26 (×3): qty 1
  Filled 2011-07-26: qty 2

## 2011-07-26 MED ORDER — OXYCODONE-ACETAMINOPHEN 5-325 MG PO TABS: 2.0000 | ORAL_TABLET | Freq: Four times a day (QID) | ORAL | Status: DC | PRN

## 2011-07-26 MED ORDER — DOCUSATE SODIUM 100 MG PO CAPS
100.0000 mg | ORAL_CAPSULE | Freq: Every day | ORAL | Status: DC | PRN
  Filled 2011-07-26: qty 1

## 2011-07-26 MED ORDER — SODIUM CHLORIDE 0.9 % IV SOLN
INTRAVENOUS | Status: DC
  Administered 2011-07-26 – 2011-07-29 (×4): via INTRAVENOUS

## 2011-07-26 MED ORDER — LORAZEPAM 0.5 MG PO TABS: 0.5000 mg | ORAL_TABLET | Freq: Four times a day (QID) | ORAL | Status: DC | PRN

## 2011-07-26 MED ORDER — MAGIC MOUTHWASH W/LIDOCAINE
5.0000 mL | Freq: Four times a day (QID) | ORAL | Status: DC | PRN
  Filled 2011-07-26 (×2): qty 5

## 2011-07-26 MED ORDER — PROMETHAZINE HCL 25 MG PO TABS: 12.5000 mg | ORAL_TABLET | Freq: Four times a day (QID) | ORAL | Status: DC | PRN

## 2011-07-26 MED ORDER — SODIUM CHLORIDE 0.9 % IV SOLN
500.0000 mg | Freq: Four times a day (QID) | INTRAVENOUS | Status: DC
  Administered 2011-07-26 – 2011-07-29 (×12): 500 mg via INTRAVENOUS
  Filled 2011-07-26 (×16): qty 500

## 2011-07-26 MED ORDER — FLUCONAZOLE 100 MG PO TABS
100.0000 mg | ORAL_TABLET | Freq: Every day | ORAL | Status: DC
  Administered 2011-07-26 – 2011-07-29 (×4): 100 mg via ORAL
  Filled 2011-07-26 (×4): qty 1

## 2011-07-26 MED ORDER — OXYCODONE-ACETAMINOPHEN 5-325 MG PO TABS
2.0000 | ORAL_TABLET | Freq: Four times a day (QID) | ORAL | Status: DC | PRN
  Administered 2011-07-26 – 2011-07-29 (×8): 2 via ORAL
  Filled 2011-07-26 (×9): qty 2

## 2011-07-26 MED ORDER — LORAZEPAM 0.5 MG PO TABS
0.5000 mg | ORAL_TABLET | Freq: Four times a day (QID) | ORAL | Status: DC | PRN
  Administered 2011-07-27 – 2011-07-29 (×3): 0.5 mg via ORAL
  Filled 2011-07-26 (×3): qty 1

## 2011-07-26 MED ORDER — VANCOMYCIN HCL IN DEXTROSE 1-5 GM/200ML-% IV SOLN
1000.0000 mg | Freq: Two times a day (BID) | INTRAVENOUS | Status: DC
  Administered 2011-07-26 – 2011-07-27 (×2): 1000 mg via INTRAVENOUS
  Filled 2011-07-26 (×3): qty 200

## 2011-07-26 NOTE — H&P (Signed)
CC: [name]    HPI: Alyssa May is a 46 year-old [place] woman who first noted a small mass in her Left breast November 2011, shortly after she stopped breast-feeding. It did not disappear with resumption of her periods and grew over the next year, and particularly since she had her miscarrriage October 2012. This brought her to the Emergency Room 03/09/2011 where she was found to have a fluctuant area in the UOQ of the Left breast measuring 8.4 cm. The tissue beneath the cystic area extending towards the axilla and medially was described as firm, but not erythematous. The cystic area was lanced and a large amount of serosanguinos material issued.  The patient was referred to Dr. Marcille Blanco who saw her 03/11/2011. By this time the fluid had largely reaccumulated.[name] He incised the lesion, again draining serosanguinous liquid, packed it, and set the patient up [facility]14[facility]. This showed an irregularly marginated mass measuring 7.3 cm, with abnormal axillary adenopathy. On 01/1102013 bilateral mammography confirmed a high-density mass in the Left UOQ. This was biopsied, as was a Left axillary lymph node. [place] biopsies ([id number]) showed invasive ductal carcinoma, high grade, triple negative, with an Mib-1 of 97%.    Interval History: The patient received her first dose of neoadjuvant chemotherapy one week ago, and is currently day 7, cycle 1 of docetaxel/cyclophosphamide/doxorubicin given on a Q. three-week basis. She received Neulasta on day 2. She presents to the clinic today with complaints of fever, fatigue, and diffuse body aches. Her temp in the clinic was 99.8, but per her report, she has been running fevers of 100 and above. Other than fatigue and diffuse body aches, she has no specific complaints today.  Past Medical History  Diagnosis Date  . Breast abscess   . No pertinent past medical history   . Cancer     lt br     Past Surgical History    Procedure Date  . Dilation and curettage of uterus 01/04/11  . Breast mass excision 2013  . Tonsillectomy   . Portacath placement 04/12/2011    Procedure: INSERTION PORT-A-CATH;  Surgeon: Wilmon Arms. Tsuei, MD;  Location: [facility];  Service: General;  Laterality: Right;  right subclavian  . Incision and drainage breast abscess 03/31/11    Family History  Problem Relation Age of Onset  . Diabetes Mother   . Hypertension Mother     Gynecologic history: First pregnancy to term age 13. Periods had not resumed since her October 2012 miscarriage, until her LMP on 07/23/2011.  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 Her husband Shivali Quackenbush is a Hospital doctor for a Acupuncturist in [place]. Their daughter, [name], is 3. The family stays part-time with the patient's mother in [place]   Health maintenance:  The patient  reports that she has never smoked. She has never used smokeless tobacco. She reports that she drinks alcohol. She reports that she does not use illicit drugs.   Cholesterol   Bone density   Colonoscopy  (PSA)   (PAP)   Allergies:  Allergies  Allergen Reactions  . Sulfa Antibiotics Swelling       . fluconazole  100 mg Oral Daily  . imipenem-cilastatin  500 mg Intravenous Q6H   Current Facility-Administered Medications  Medication Dose Route Frequency Provider Last Rate Last Dose  . 0.9 %  sodium chloride infusion   Intravenous Continuous Nohelani Benning Allegra Grana,  PA      . docusate sodium (COLACE) capsule 100 mg  100 mg Oral Daily PRN Haden Suder Allegra Grana, PA      . fluconazole (DIFLUCAN) tablet 100 mg  100 mg Oral Daily Djon Tith Allegra Grana, PA      . imipenem-cilastatin (PRIMAXIN) 500 mg in sodium chloride 0.9 % 100 mL IVPB  500 mg Intravenous Q6H Rakayla Ricklefs Allegra Grana, PA      . LORazepam (ATIVAN) tablet 0.5 mg  0.5 mg Oral Q6H PRN Larayah Clute Allegra Grana, PA      . magic mouthwash w/lidocaine  5 mL Oral QID PRN Alysiah Suppa Allegra Grana, PA      . oxyCODONE-acetaminophen (PERCOCET)  5-325 MG per tablet 2 tablet  2 tablet Oral Q6H PRN Vaishnavi Dalby Allegra Grana, PA      . promethazine (PHENERGAN) tablet 12.5 mg  12.5 mg Oral Q6H PRN Anisten Tomassi Allegra Grana, PA       Current Outpatient Prescriptions  Medication Sig Dispense Refill  . cyclophosphamide (CYTOXAN) 2 G chemo injection Inject 940 mg into the vein See admin instructions. Every 3 weeks. Dr. Darnelle Catalan MD      . dexamethasone (DECADRON) 4 MG tablet Take 4 mg by mouth 2 (two) times daily with a meal. As directed. Start 1 day befor and 5 days after      . DOCEtaxel (TAXOTERE IV) Inject 140 mg into the vein See admin instructions. Every 3 weeks. Dr. Darnelle Catalan MD      . DOXOrubicin HCl (ADRIAMYCIN IV) Inject 94 mg into the vein See admin instructions. Every 3 weeks. Dr. Darnelle Catalan MD      . fluconazole (DIFLUCAN) 200 MG tablet Take 1 tablet (200 mg total) by mouth daily.  14 tablet  0  . lidocaine-prilocaine (EMLA) cream Apply 1 application topically as needed. To port      . loratadine (CLARITIN) 10 MG tablet Take 10 mg by mouth daily.      [name] LORazepam (ATIVAN) 0.5 MG tablet Take 0.5 mg by mouth at bedtime as needed.      [name] morphine (MSIR) 15 MG tablet Take 15-30 mg by mouth at bedtime as needed. For pain      . oxyCODONE-acetaminophen (PERCOCET) 5-325 MG per tablet 1-2 tab PO Q 6 Hr PRN pain  60 tablet  0  . prochlorperazine (COMPAZINE) 10 MG tablet Take 10 mg by mouth every 6 (six) hours as needed. For nausea      . tobramycin-dexamethasone (TOBRADEX) ophthalmic ointment Place 1 application into both eyes 2 (two) times daily.        ROS: As noted above in history of present illness, the patient's primary complaints today are fever, fatigue, and diffuse body aches and pains. She's had no chills or night sweats. No rashes, skin changes, or abnormal bleeding. She did start her menstrual cycle this past weekend. She's had no nausea or emesis. No diarrhea or constipation. No chest pain, palpitations, cough, or shortness of breath. No abnormal headaches or  dizziness. She does feel weak. No peripheral swelling.  Detailed review of systems is otherwise noncontributory.    Physical Exam: Vitals: Weight 157 pounds 14.4 ounces;  height 5 foot 5 inches; blood pressure 103/69; pulse 99; respirations 20; temp 99.8 General: Very weak appearing young [place] American female who appears uncomfortable on exam  HEENT:  Sclerae anicteric, conjunctivae pale.  Oropharynx shows mild oral candidiasis..  No mucositis or ulcerations.   Nodes:  No cervical, supraclavicular, or axillary lymphadenopathy palpated.  Breast  Exam:  Deferred, and clean dry dressing was intact over the left breast. Port is intact in the right upper chest wall with no signs of erythema or infection.  Lungs:  Clear to auscultation bilaterally, although slightly diminished breath sounds bibasilar.  No frank crackles, rhonchi, or wheezes.   Heart:  Regular rate and rhythm.   Abdomen:  Soft, nontender.  Positive bowel sounds.  No organomegaly or masses palpated.   Musculoskeletal:  No focal spinal tenderness to palpation.  Extremities:  Benign.  No peripheral edema or cyanosis.   Skin:  Benign.   Neuro:  Nonfocal. Alert and oriented x3    LABS   CBC with Diferential in the clinic on 07/26/11, WBC 0.4; ANC 0.1; hemoglobin 7.9; hematocrit 25.3; platelets 231,000.  Results for orders placed during the hospital encounter of 07/24/11 (from the past 48 hour(s))  CBC     Status: Abnormal   Collection Time   07/24/11  9:05 PM      Component Value Range Comment   WBC 10.9 (*) 4.0 - 10.5 (K/uL)    RBC 4.33  3.87 - 5.11 (MIL/uL)    Hemoglobin 8.6 (*) 12.0 - 15.0 (g/dL)    HCT 01.0 (*) 27.2 - 46.0 (%)    MCV 64.9 (*) 78.0 - 100.0 (fL)    MCH 19.9 (*) 26.0 - 34.0 (pg)    MCHC 30.6  30.0 - 36.0 (g/dL)    RDW 53.6 (*) 64.4 - 15.5 (%)    Platelets 339  150 - 400 (K/uL)   DIFFERENTIAL     Status: Abnormal   Collection Time   07/24/11  9:05 PM      Component Value Range Comment   Neutrophils  Relative 94 (*) 43 - 77 (%)    Lymphocytes Relative 5 (*) 12 - 46 (%)    Monocytes Relative 1 (*) 3 - 12 (%)    Eosinophils Relative 0  0 - 5 (%)    Basophils Relative 0  0 - 1 (%)    Neutro Abs 10.3 (*) 1.7 - 7.7 (K/uL)    Lymphs Abs 0.5 (*) 0.7 - 4.0 (K/uL)    Monocytes Absolute 0.1  0.1 - 1.0 (K/uL)    Eosinophils Absolute 0.0  0.0 - 0.7 (K/uL)    Basophils Absolute 0.0  0.0 - 0.1 (K/uL)    RBC Morphology ELLIPTOCYTES     BASIC METABOLIC PANEL     Status: Abnormal   Collection Time   07/24/11  9:05 PM      Component Value Range Comment   Sodium 134 (*) 135 - 145 (mEq/L)    Potassium 3.4 (*) 3.5 - 5.1 (mEq/L)    Chloride 98  96 - 112 (mEq/L)    CO2 27  19 - 32 (mEq/L)    Glucose, Bld 105 (*) 70 - 99 (mg/dL)    BUN 17  6 - 23 (mg/dL)    Creatinine, Ser 0.34  0.50 - 1.10 (mg/dL)    Calcium 8.2 (*) 8.4 - 10.5 (mg/dL)    GFR calc non Af Amer >90  >90 (mL/min)    GFR calc Af Amer >90  >90 (mL/min)        FILMS:  No results found.   Assessment:  46 year-old [place] woman with   (1)  stage IV breast cancer, s/p Left breast biopsy 03/18/2011 showing a clinical T4,N1 invasive ductal carcinoma, high grade, triple negative, with an Mib-1 of 97%, with a single liver metastasis pathologically confirmed  03/31/2011.  Currently being treated in the neoadjuvant setting, today being day 7, cycle 1, of docetaxel/doxorubicin/cyclophosphamide given on a Q. three-week basis, with Neulasta given on day 2 for granulocyte support.  (2)  febrile neutropenia despite Neulasta support  (3)  symptomatic anemia with hemoglobin 7.9 on 07/26/2011.   Plan:  Ms. Su Hilt is being admitted to the [facility] oncology unit for further evaluation of her febrile neutropenia, with administration of IV antibiotics. We will obtain urinalysis with urine culture; chest x-ray; and blood cultures x2. We will then begin IV Primaxin along with IV vancomycin. The patient will be on neutropenic precautions.  We'll  also follow her symptomatic anemia, with consideration for blood transfusion during hospitalization.  This case, admission, and plan were all reviewed with Ms. Su Hilt' attending physician, Dr. Ruthann Cancer.    Asiah Browder 07/26/2011, 2:44 PM

## 2011-07-26 NOTE — Progress Notes (Signed)
Patient was a direct admit to the hospital today due to febrile neutropenia. See associated H & P.  Zollie Scale, PA-C 07/26/2011

## 2011-07-26 NOTE — Telephone Encounter (Signed)
THIS MORNING PT.'S TEMPERATURE WAS 101.7. IT DROPPED TO 99.4. NOW IT IS 101.4. THE DRAINAGE FROM BREAST WOUND IS A LIGHT YELLOW IN COLOR. NO SIGN OF ANY INFECTIONS. CHEMO TREATMENT WAS 07/20/11. VERBAL ORDER AND READ BACK TO DR.MAGRINAT- PT. SHOULD COME TO OFFICE NOW FOR CBC AND AN OFFICE VISIT. NOTIFIED PT.'S HUSBAND. THEY WILL BE AT THE OFFICE IN 35 MINUTES. CBC WITH DIFF IN COMPUTER AND NOTIFIED LAURIE TO PUT IN A LAB AND MD VISIT. SHE VOICES UNDERSTANDING.

## 2011-07-26 NOTE — Progress Notes (Signed)
ANTIBIOTIC CONSULT NOTE - INITIAL  Pharmacy Consult for Vancomycin Indication: febrile neutropenia  Allergies  Allergen Reactions  . Sulfa Antibiotics Swelling    Patient Measurements: Height: 5\' 5"  (165.1 cm) IBW/kg (Calculated) : 57   Vital Signs: Temp: 99.7 F (37.6 C) (05/21 1504) Temp src: Oral (05/21 1504) BP: 119/68 mmHg (05/21 1504) Pulse Rate: 99  (05/21 1504) Intake/Output from previous day:   Intake/Output from this shift:    Labs:  Basename 07/26/11 1339 07/24/11 2105  WBC 0.4* 10.9*  HGB 7.9* 8.6*  PLT 231 339  LABCREA -- --  CREATININE -- 0.62   The CrCl is unknown because both a height and weight (above a minimum accepted value) are required for this calculation. No results found for this basename: VANCOTROUGH:2,VANCOPEAK:2,VANCORANDOM:2,GENTTROUGH:2,GENTPEAK:2,GENTRANDOM:2,TOBRATROUGH:2,TOBRAPEAK:2,TOBRARND:2,AMIKACINPEAK:2,AMIKACINTROU:2,AMIKACIN:2, in the last 72 hours   Microbiology: Recent Results (from the past 720 hour(s))  TECHNOLOGIST REVIEW     Status: Normal   Collection Time   07/26/11  1:39 PM      Component Value Range Status Comment   Technologist Review Oc shistocytes,mod ovals   Final     Medical History: Past Medical History  Diagnosis Date  . Breast abscess   . No pertinent past medical history   . Cancer     lt br     Medications:  Anti-infectives     Start     Dose/Rate Route Frequency Ordered Stop   07/26/11 1600   vancomycin (VANCOCIN) IVPB 1000 mg/200 mL premix        1,000 mg 200 mL/hr over 60 Minutes Intravenous Every 12 hours 07/26/11 1515     07/26/11 1530   fluconazole (DIFLUCAN) tablet 100 mg        100 mg Oral Daily 07/26/11 1443     07/26/11 1500   imipenem-cilastatin (PRIMAXIN) 500 mg in sodium chloride 0.9 % 100 mL IVPB        500 mg 200 mL/hr over 30 Minutes Intravenous Every 6 hours 07/26/11 1431           Assessment:  Newly diagnosed Breast Ca, Cycle 1 TAC completed 5/15, Neulasta  5/16  Febrile, neutropenic; begin antibiotics Primaxin, Vancomycin, continue Fluconazole  Seen in ED 5/19, home with po Fluconazole 200mg  x 1 dose 5/20  Noted breast wound drainage  Urine culture ordered  Goal of Therapy:  Vancomycin trough level 15-20 mcg/ml  Plan:  Vancomycin 1000 gm IV q12 Primaxin 500mg  Iv q6h/MD, dose and schedule appropriate Continue Fluconazole 100mg  po daily  Khalessi Blough L 07/26/2011,3:15 PM

## 2011-07-27 ENCOUNTER — Other Ambulatory Visit: Payer: Medicaid Other | Admitting: Lab

## 2011-07-27 ENCOUNTER — Ambulatory Visit: Payer: Medicaid Other | Admitting: Physician Assistant

## 2011-07-27 DIAGNOSIS — R5081 Fever presenting with conditions classified elsewhere: Secondary | ICD-10-CM

## 2011-07-27 DIAGNOSIS — C50919 Malignant neoplasm of unspecified site of unspecified female breast: Secondary | ICD-10-CM

## 2011-07-27 DIAGNOSIS — D709 Neutropenia, unspecified: Principal | ICD-10-CM

## 2011-07-27 DIAGNOSIS — D649 Anemia, unspecified: Secondary | ICD-10-CM

## 2011-07-27 LAB — URINALYSIS, ROUTINE W REFLEX MICROSCOPIC
Glucose, UA: NEGATIVE mg/dL
Leukocytes, UA: NEGATIVE
Specific Gravity, Urine: 1.009 (ref 1.005–1.030)
Urobilinogen, UA: 0.2 mg/dL (ref 0.0–1.0)

## 2011-07-27 LAB — URINE MICROSCOPIC-ADD ON

## 2011-07-27 LAB — CBC
Hemoglobin: 7.1 g/dL — ABNORMAL LOW (ref 12.0–15.0)
MCH: 20.2 pg — ABNORMAL LOW (ref 26.0–34.0)
MCHC: 31.6 g/dL (ref 30.0–36.0)
MCV: 63.9 fL — ABNORMAL LOW (ref 78.0–100.0)

## 2011-07-27 LAB — PREPARE RBC (CROSSMATCH)

## 2011-07-27 LAB — PATHOLOGIST SMEAR REVIEW

## 2011-07-27 LAB — RETICULOCYTES

## 2011-07-27 LAB — ABO/RH: ABO/RH(D): B POS

## 2011-07-27 MED ORDER — SODIUM CHLORIDE 0.9 % IV BOLUS (SEPSIS)
500.0000 mL | Freq: Once | INTRAVENOUS | Status: AC
  Administered 2011-07-27: 500 mL via INTRAVENOUS

## 2011-07-27 MED ORDER — ACETAMINOPHEN 325 MG PO TABS
650.0000 mg | ORAL_TABLET | Freq: Once | ORAL | Status: AC
  Administered 2011-07-27: 650 mg via ORAL
  Filled 2011-07-27: qty 2

## 2011-07-27 MED ORDER — VANCOMYCIN HCL IN DEXTROSE 1-5 GM/200ML-% IV SOLN
1000.0000 mg | Freq: Three times a day (TID) | INTRAVENOUS | Status: DC
  Administered 2011-07-27 – 2011-07-28 (×4): 1000 mg via INTRAVENOUS
  Filled 2011-07-27 (×5): qty 200

## 2011-07-27 MED ORDER — METOCLOPRAMIDE HCL 5 MG PO TABS
5.0000 mg | ORAL_TABLET | Freq: Three times a day (TID) | ORAL | Status: DC
  Administered 2011-07-27 – 2011-07-29 (×8): 5 mg via ORAL
  Filled 2011-07-27 (×10): qty 1

## 2011-07-27 MED ORDER — ALTEPLASE 100 MG IV SOLR
2.0000 mg | Freq: Once | INTRAVENOUS | Status: AC | PRN
  Filled 2011-07-27: qty 2

## 2011-07-27 MED ORDER — DIPHENHYDRAMINE HCL 25 MG PO CAPS
25.0000 mg | ORAL_CAPSULE | Freq: Once | ORAL | Status: AC
  Administered 2011-07-27: 25 mg via ORAL
  Filled 2011-07-27: qty 1

## 2011-07-27 MED ORDER — DIPHENHYDRAMINE HCL 25 MG PO CAPS
25.0000 mg | ORAL_CAPSULE | Freq: Once | ORAL | Status: AC
  Administered 2011-07-27: 25 mg via ORAL

## 2011-07-27 MED ORDER — DAKINS (1/2 STRENGTH) 0.25 % EX SOLN
Freq: Two times a day (BID) | CUTANEOUS | Status: DC
  Administered 2011-07-27 – 2011-07-28 (×2)
  Filled 2011-07-27: qty 473

## 2011-07-27 NOTE — Progress Notes (Signed)
Patient received 2 units PRBC's and she tolerated them without any problems.  Allayne Butcher [facility]  07/27/2011  7:42 PM

## 2011-07-27 NOTE — Progress Notes (Signed)
ANTIBIOTIC CONSULT NOTE - FOLLOW UP  Pharmacy Consult for Vanc/Zosyn Indication: Febrile Neutropenia  Allergies  Allergen Reactions  . Sulfa Antibiotics Swelling    Patient Measurements: Height: 5\' 5"  (165.1 cm) Weight: 156 lb (70.761 kg) IBW/kg (Calculated) : 57    Vital Signs: Temp: 99.8 F (37.7 C) (05/22 0600) Temp src: Oral (05/22 0600) BP: 99/65 mmHg (05/22 0600) Pulse Rate: 95  (05/22 0600) Intake/Output from previous day: 05/21 0701 - 05/22 0700 In: 500 [I.V.:500] Out: -  Intake/Output from this shift:    Labs:  Basename 07/27/11 0359 07/26/11 2213 07/26/11 1339 07/24/11 2105  WBC 0.3* 0.3* 0.4* --  HGB 7.1* 7.1* 7.9* --  PLT 197 191 231 --  LABCREA -- -- -- --  CREATININE -- 0.50 -- 0.62   Estimated Creatinine Clearance: 88.5 ml/min (by C-G formula based on Cr of 0.5).  Microbiology: Recent Results (from the past 720 hour(s))  TECHNOLOGIST REVIEW     Status: Normal   Collection Time   07/26/11  1:39 PM      Component Value Range Status Comment   Technologist Review Oc shistocytes,mod ovals   Final     Anti-infectives     Start     Dose/Rate Route Frequency Ordered Stop   07/27/11 1300   vancomycin (VANCOCIN) IVPB 1000 mg/200 mL premix        1,000 mg 200 mL/hr over 60 Minutes Intravenous Every 8 hours 07/27/11 0919     07/26/11 1600   vancomycin (VANCOCIN) IVPB 1000 mg/200 mL premix  Status:  Discontinued        1,000 mg 200 mL/hr over 60 Minutes Intravenous Every 12 hours 07/26/11 1515 07/27/11 0919   07/26/11 1530   fluconazole (DIFLUCAN) tablet 100 mg        100 mg Oral Daily 07/26/11 1443     07/26/11 1500   imipenem-cilastatin (PRIMAXIN) 500 mg in sodium chloride 0.9 % 100 mL IVPB        500 mg 200 mL/hr over 30 Minutes Intravenous Every 6 hours 07/26/11 1431            Assessment:  46 YOF w/ newly dx Breast Ca, s/p C1 TAC 5/15, Neulasta 5/16.  Admitted 5/21 w/ febrile neutropenia.  Day #2 Primaxin 500mg  IV q6h and Vanc 1g IV  q12h   Also on Fluconazole 100mg  daily (PTA med)  Scr wnl, stable, CrCl >172ml/min/1.73m2. Wt 71kg.  Tm 99.8,   Cx pending, rare bacteria on UA   Goal of Therapy:  Vancomycin trough level 15-20 mcg/ml Appropriate dose of Zosyn Plan:   Increase vancomycin to 1g IV q8h given wt/crcl  Continue current dose of Primaxin  Vancomycin tr @ Css  Continue to monitor labs, vitals and cx.  Adjust doses as appropriate.  Gwen Her PharmD  [phone] 07/27/2011 9:27 AM

## 2011-07-27 NOTE — Progress Notes (Signed)
UR complete 

## 2011-07-27 NOTE — Progress Notes (Signed)
Alyssa May   DOB:Aug 24, 1965   WU#:[id number]   GNF#:[id number]  Subjective: had "an interesting night," but able to sleep; vomited x1 after supper, not clearly related to narcotics; no appetite, taste altered; having her period; pain with dressing changes; no family in room   Objective:  Young African American woman examined in bed Filed Vitals:   07/27/11 0600  BP: 99/65  Pulse: 95  Temp: 99.8 F (37.7 C)  Resp: 18    Body mass index is 25.96 kg/(m^2). No intake or output data in the 24 hours ending 07/27/11 0809   Sclerae unicteric  Lungs clear -- no rales or rhonchi  Heart regular rate and rhythm  Abdomen benign  MSK no focal spinal tenderness, no peripheral edema  Neuro nonfocal   CBG (last 3)  No results found for this basename: GLUCAP:3 in the last 72 hours   Labs:  Lab Results  Component Value Date   WBC 0.3* 07/27/2011   HGB 7.1* 07/27/2011   HCT 22.5* 07/27/2011   MCV 63.9* 07/27/2011   PLT 197 07/27/2011   NEUTROABS 0.1* 07/26/2011     Lab 07/27/11 0700 07/26/11 2213 07/24/11 2105 07/20/11 1005  NA 129* 125* 134* 136  K -- 4.0 3.4* 4.7  CL -- 92* 98 102  CO2 -- 26 27 23   GLUCOSE -- 134* 105* 163*  BUN -- 6 17 10   CREATININE -- 0.50 0.62 0.67  CALCIUM -- 7.9* 8.2* 9.1  MG -- -- -- --    Urine Studies No results found for this basename: UACOL:2,UAPR:2,USPG:2,UPH:2,UTP:2,UGL:2,UKET:2,UBIL:2,UHGB:2,UNIT:2,UROB:2,ULEU:2,UEPI:2,UWBC:2,URBC:2,UBAC:2,CAST:2,CRYS:2,UCOM:2,BILUA:2 in the last 72 hours     Studies:  X-ray Chest Pa And Lateral   07/26/2011  *RADIOLOGY REPORT*  Clinical Data: Fever, weakness.  CHEST - 2 VIEW  Comparison: 04/12/2011  Findings: Right Port-A-Cath remains in place, unchanged.  Heart is normal size.  No confluent airspace opacity or effusion.  No acute bony abnormality.  IMPRESSION: No active cardiopulmonary disease.  Original Report Authenticated By: Cyndie Chime, M.D.    Assessment/Plan: 46 year-old [place] woman with stage IV  breast cancer, s/p Left breast biopsy 03/18/2011 showing a clinical T4,N1 invasive ductal carcinoma, high grade, triple negative, with an Mib-1 of 97%, with a single liver metastasis pathologically confirmed 03/31/2011; admitted with fever/neutropenia  (a) day 8, cycle 1 docetaxel/ cyclophosphamide/ doxorubicin; will decrease doses by 15% with cycle 2, due June 5  (b) fever/ neutropenia: day 2 imipenem/ vancomycin; continues neutropenic; anticipate white cell recovery next 24-72 hours  (c) anemia: will transfuse 2 units PRBCs today; discussed with patient  (d) malnutrition: will add reglan AC, follow  (e) hyponatremia: likely due to cyclophosphamide; continue hydration with NS, follow    Vinod Mikesell C 07/27/2011

## 2011-07-27 NOTE — Significant Event (Signed)
CRITICAL VALUE ALERT  Critical value received:  WBC  Date of notification: 07/26/2011  Time of notification:  2305  Critical value read back:yes  Nurse who received alert:  Cindra Eves  MD notified (1st page):  Dr Pierce Crane  Time of first page:  2345  MD notified (2nd page):  Time of second page:  Responding MD: Dr Pierce Crane  Time MD responded:  23:51

## 2011-07-27 NOTE — Consult Note (Signed)
WOC consult Note Reason for Consult:Wound management for left breast carcinoma Wound type:neoplastic Pressure Ulcer POA: No Measurement:left lateral breast: approximately 15cm x 17cm with area of depth at 12 o'clock measuring 3cm deep.  Area is 75% covered with necrotic tissue.  25% of area is pink and moist, but non-granulating. There is a small skin bridge separating the most proximal portion of the wound from the larger, predominant area. Wound bed:See above Drainage (amount, consistency, odor) There is serosanguinous exudate on the old dressing with a strong, unpleasant odor. Patient states that her main objective is odor control. Periwound:Intact, but very tender to touch.  Patient states that she experiences much discomfort with tape/medical adhesive removal. Dressing procedure/placement/frequency: After discussing with Oncology Pharmacist, I will continue with the twice daily moist dressings covered with an ABD pad, but will change the moistening solution from saline to .25%  Hypochlorite (Dakin's) solution for odor control.  A mesh brief (with the center portion cut) is used to secure the dressings to eliminate the adhesive related discomfort. Should the exudate volume increase and become problematic, I will continue to cleanse/rinse the area with hypochlorite solution, but change the topical dressing to one better suited for absorption such as Aquacel Ag+.   I will not follow, but will remain available to this patient and her medical and oncology staff.  Please re-consult if needed. Thanks, Ladona Mow, MSN, RN, [facility], CWOCN [phone])

## 2011-07-27 NOTE — Plan of Care (Signed)
Problem: Consults Goal: Skin Care Protocol Initiated - if indicated If consults are not indicated, leave blank or document N/A  Outcome: Completed/Met Date Met:  07/27/11 WOC RN consult

## 2011-07-28 DIAGNOSIS — E46 Unspecified protein-calorie malnutrition: Secondary | ICD-10-CM

## 2011-07-28 LAB — TYPE AND SCREEN
ABO/RH(D): B POS
Antibody Screen: NEGATIVE
Unit division: 0

## 2011-07-28 LAB — CBC
HCT: 26.3 % — ABNORMAL LOW (ref 36.0–46.0)
Hemoglobin: 8.6 g/dL — ABNORMAL LOW (ref 12.0–15.0)
MCH: 22.1 pg — ABNORMAL LOW (ref 26.0–34.0)
RBC: 3.9 MIL/uL (ref 3.87–5.11)

## 2011-07-28 LAB — DIFFERENTIAL
Basophils Absolute: 0 10*3/uL (ref 0.0–0.1)
Eosinophils Absolute: 0 10*3/uL (ref 0.0–0.7)
Lymphs Abs: 0 10*3/uL — ABNORMAL LOW (ref 0.7–4.0)
Monocytes Absolute: 0 10*3/uL — ABNORMAL LOW (ref 0.1–1.0)
nRBC: 0 /100 WBC

## 2011-07-28 LAB — COMPREHENSIVE METABOLIC PANEL
ALT: 11 U/L (ref 0–35)
Alkaline Phosphatase: 61 U/L (ref 39–117)
BUN: 4 mg/dL — ABNORMAL LOW (ref 6–23)
CO2: 25 mEq/L (ref 19–32)
GFR calc Af Amer: >90 mL/min (ref >90)
GFR calc non Af Amer: >90 mL/min (ref >90)
Glucose, Bld: 98 mg/dL (ref 70–99)
Potassium: 4 mEq/L (ref 3.5–5.1)
Sodium: 132 mEq/L — ABNORMAL LOW (ref 135–145)

## 2011-07-28 LAB — URINE CULTURE
Colony Count: NO GROWTH
Culture: NO GROWTH

## 2011-07-28 MED ORDER — VANCOMYCIN HCL 1000 MG IV SOLR
1250.0000 mg | Freq: Three times a day (TID) | INTRAVENOUS | Status: DC
  Administered 2011-07-28 – 2011-07-29 (×3): 1250 mg via INTRAVENOUS
  Filled 2011-07-28 (×5): qty 1250

## 2011-07-28 NOTE — Progress Notes (Signed)
ANTIBIOTIC CONSULT NOTE - FOLLOW UP  Pharmacy Consult for Vanc/Zosyn Indication: Febrile Neutropenia  Allergies  Allergen Reactions  . Sulfa Antibiotics Swelling    Patient Measurements: Height: 5\' 5"  (165.1 cm) Weight: 156 lb (70.761 kg) IBW/kg (Calculated) : 57    Vital Signs: Temp: 98.7 F (37.1 C) (05/23 1409) Temp src: Oral (05/23 1409) BP: 95/65 mmHg (05/23 1409) Pulse Rate: 95  (05/23 1409) Intake/Output from previous day:   Intake/Output from this shift:    Labs:  Basename 07/28/11 0350 07/27/11 0359 07/26/11 2213  WBC 0.8* 0.3* 0.3*  HGB 8.6* 7.1* 7.1*  PLT 164 197 191  LABCREA -- -- --  CREATININE 0.55 -- 0.50   Estimated Creatinine Clearance: 88.5 ml/min (by C-G formula based on Cr of 0.55).  Microbiology: Recent Results (from the past 720 hour(s))  TECHNOLOGIST REVIEW     Status: Normal   Collection Time   07/26/11  1:39 PM      Component Value Range Status Comment   Technologist Review Oc shistocytes,mod ovals   Final   URINE CULTURE     Status: Normal   Collection Time   07/27/11 12:41 AM      Component Value Range Status Comment   Specimen Description URINE, RANDOM   Final    Special Requests NONE   Final    Culture  Setup Time [id number]   Final    Colony Count NO GROWTH   Final    Culture NO GROWTH   Final    Report Status 07/28/2011 FINAL   Final     Anti-infectives     Start     Dose/Rate Route Frequency Ordered Stop   07/28/11 2100   vancomycin (VANCOCIN) 1,250 mg in sodium chloride 0.9 % 250 mL IVPB        1,250 mg 166.7 mL/hr over 90 Minutes Intravenous Every 8 hours 07/28/11 1527     07/27/11 1300   vancomycin (VANCOCIN) IVPB 1000 mg/200 mL premix  Status:  Discontinued        1,000 mg 200 mL/hr over 60 Minutes Intravenous Every 8 hours 07/27/11 0919 07/28/11 1527   07/26/11 1600   vancomycin (VANCOCIN) IVPB 1000 mg/200 mL premix  Status:  Discontinued        1,000 mg 200 mL/hr over 60 Minutes Intravenous Every 12 hours  07/26/11 1515 07/27/11 0919   07/26/11 1530   fluconazole (DIFLUCAN) tablet 100 mg        100 mg Oral Daily 07/26/11 1443     07/26/11 1500   imipenem-cilastatin (PRIMAXIN) 500 mg in sodium chloride 0.9 % 100 mL IVPB        500 mg 200 mL/hr over 30 Minutes Intravenous Every 6 hours 07/26/11 1431            Assessment:  46 YOF w/ newly dx Breast Ca, s/p C1 TAC 5/15, Neulasta 5/16.  Admitted 5/21 w/ febrile neutropenia. WBC up slightly today  Day #3 Primaxin 500mg  IV q6h and Vanc 1g IV q8h (dose increased per nomogram yesterday from q12h)  Also on Fluconazole 100mg  daily (PTA med)  Scr wnl, stable, CrCl >180ml/min/1.73m2.   VT low 13.5  Goal of Therapy:  Vancomycin trough level 15-20 mcg/ml Appropriate dose of Zosyn Plan:   Continue current dose of Primaxin  Increase Vanc to 1250mg  IV q8h, recheck tr as necessary  Continue to monitor labs, vitals and cx.  Adjust doses as appropriate.  Gwen Her PharmD  [phone] 07/28/2011 3:27 PM

## 2011-07-28 NOTE — Progress Notes (Signed)
Alyssa May   DOB:18-Oct-1965   ZO#:[id number]   JWJ#:[id number]  Subjective: "I'm finally feeling like myself." Various attempts at dressing Left breast wound, but "my husband does it best." Appreciate wound nurse's help and we will follow her suggestions at discharge   Objective:  Young African American woman examined sitting up having breakfast in bed Filed Vitals:   07/28/11 0437  BP: 90/59  Pulse: 98  Temp: 98.6 F (37 C)  Resp: 18    Body mass index is 25.96 kg/(m^2). No intake or output data in the 24 hours ending 07/28/11 0838   Sclerae unicteric  Lungs clear -- no rales or rhonchi  Heart regular rate and rhythm  Abdomen benign  MSK no focal spinal tenderness, no peripheral edema  Neuro nonfocal  Left breast bandaged over--clean and dry   CBG (last 3)  No results found for this basename: GLUCAP:3 in the last 72 hours   Labs:  Lab Results  Component Value Date   WBC 0.8* 07/28/2011   HGB 8.6* 07/28/2011   HCT 26.3* 07/28/2011   MCV 67.4* 07/28/2011   PLT 164 07/28/2011   NEUTROABS 0.2* 07/28/2011     Lab 07/28/11 0350 07/27/11 0700 07/26/11 2213 07/24/11 2105  NA 132* 129* 125* 134*  K 4.0 -- 4.0 3.4*  CL 98 -- 92* 98  CO2 25 -- 26 27  GLUCOSE 98 -- 134* 105*  BUN 4* -- 6 17  CREATININE 0.55 -- 0.50 0.62  CALCIUM 8.1* -- 7.9* 8.2*  MG -- -- -- --    Urine Studies No results found for this basename: UACOL:2,UAPR:2,USPG:2,UPH:2,UTP:2,UGL:2,UKET:2,UBIL:2,UHGB:2,UNIT:2,UROB:2,ULEU:2,UEPI:2,UWBC:2,URBC:2,UBAC:2,CAST:2,CRYS:2,UCOM:2,BILUA:2 in the last 72 hours     Studies:  X-ray Chest Pa And Lateral   07/26/2011  *RADIOLOGY REPORT*  Clinical Data: Fever, weakness.  CHEST - 2 VIEW  Comparison: 04/12/2011  Findings: Right Port-A-Cath remains in place, unchanged.  Heart is normal size.  No confluent airspace opacity or effusion.  No acute bony abnormality.  IMPRESSION: No active cardiopulmonary disease.  Original Report Authenticated By: Cyndie Chime, M.D.     Assessment/Plan: 46 year-old [place] woman with stage IV breast cancer, s/p Left breast biopsy 03/18/2011 showing a clinical T4,N1 invasive ductal carcinoma, high grade, triple negative, with an Mib-1 of 97%, with a single liver metastasis pathologically confirmed 03/31/2011; admitted with fever/neutropenia  (a) day 9, cycle 1 docetaxel/ cyclophosphamide/ doxorubicin; will decrease doses by 15% with cycle 2, due June 5  (b) fever/ neutropenia: day 3 imipenem/ vancomycin; continues neutropenic; white count beginning to recover  (c) anemia: tolerated transfusion yesterday w/o event; menstrual flow abating  (d) malnutrition: will add reglan AC for nausea, follow  (e) hyponatremia: likely due to cyclophosphamide; improved  (f) d/c plans: home as soon as neutropenia resolved--possibly tomorrow  Alyssa May C 07/28/2011

## 2011-07-29 LAB — COMPREHENSIVE METABOLIC PANEL
ALT: 11 U/L (ref 0–35)
Albumin: 2.3 g/dL — ABNORMAL LOW (ref 3.5–5.2)
Alkaline Phosphatase: 71 U/L (ref 39–117)
Glucose, Bld: 105 mg/dL — ABNORMAL HIGH (ref 70–99)
Potassium: 4 mEq/L (ref 3.5–5.1)
Sodium: 134 mEq/L — ABNORMAL LOW (ref 135–145)
Total Protein: 6.2 g/dL (ref 6.0–8.3)

## 2011-07-29 LAB — CBC
Hemoglobin: 9 g/dL — ABNORMAL LOW (ref 12.0–15.0)
MCHC: 31.9 g/dL (ref 30.0–36.0)
WBC: 3.8 10*3/uL — ABNORMAL LOW (ref 4.0–10.5)

## 2011-07-29 MED ORDER — NAPROXEN SODIUM 220 MG PO TABS: 220.0000 mg | ORAL_TABLET | Freq: Three times a day (TID) | ORAL | Status: DC | PRN

## 2011-07-29 MED ORDER — AMOXICILLIN-POT CLAVULANATE 875-125 MG PO TABS: 1.0000 | ORAL_TABLET | Freq: Two times a day (BID) | ORAL | Status: AC

## 2011-07-29 MED ORDER — OXYCODONE-ACETAMINOPHEN 5-325 MG PO TABS: 2.0000 | ORAL_TABLET | Freq: Four times a day (QID) | ORAL | Status: AC | PRN

## 2011-07-29 MED ORDER — HEPARIN SOD (PORK) LOCK FLUSH 100 UNIT/ML IV SOLN
INTRAVENOUS | Status: AC
  Administered 2011-07-29: 500 [IU]
  Filled 2011-07-29: qty 5

## 2011-07-29 MED ORDER — FLUCONAZOLE 100 MG PO TABS: 100.0000 mg | ORAL_TABLET | Freq: Every day | ORAL | Status: AC

## 2011-07-29 MED ORDER — MORPHINE SULFATE ER 15 MG PO TBCR: 15.0000 mg | EXTENDED_RELEASE_TABLET | Freq: Two times a day (BID) | ORAL | Status: DC

## 2011-07-29 MED ORDER — DAKINS (1/2 STRENGTH) 0.25 % EX SOLN: Freq: Two times a day (BID) | CUTANEOUS | Status: AC

## 2011-07-29 NOTE — Discharge Summary (Signed)
Physician Discharge Summary  Patient ID: Alyssa May MRN: [id number] DOB/AGE: 09-01-1965 46 y.o.  Admit date: 07/26/2011 Discharge date: 07/29/2011  Primary Care Physician:  Provider Not In System   Discharge Diagnoses:    Present on Admission:  .Febrile neutropenia  Discharge Medications:  Medication List  As of 07/29/2011  9:18 AM   STOP taking these medications         ADRIAMYCIN IV      cyclophosphamide 2 G chemo injection      dexamethasone 4 MG tablet      morphine 15 MG tablet      TAXOTERE IV      tobramycin-dexamethasone ophthalmic ointment         TAKE these medications         amoxicillin-clavulanate 875-125 MG per tablet   Commonly known as: AUGMENTIN   Take 1 tablet by mouth 2 (two) times daily.      fluconazole 100 MG tablet   Commonly known as: DIFLUCAN   Take 1 tablet (100 mg total) by mouth daily.      lidocaine-prilocaine cream   Commonly known as: EMLA   Apply 1 application topically as needed. To port      loratadine 10 MG tablet   Commonly known as: CLARITIN   Take 10 mg by mouth daily.      LORazepam 0.5 MG tablet   Commonly known as: ATIVAN   Take 0.5 mg by mouth at bedtime as needed.      morphine 15 MG 12 hr tablet   Commonly known as: MS CONTIN   Take 1 tablet (15 mg total) by mouth 2 (two) times daily.      naproxen sodium 220 MG tablet   Commonly known as: ANAPROX   Take 1 tablet (220 mg total) by mouth 3 (three) times daily with meals as needed (for fever or pain).      oxyCODONE-acetaminophen 5-325 MG per tablet   Commonly known as: PERCOCET   Take 2 tablets by mouth every 6 (six) hours as needed.      prochlorperazine 10 MG tablet   Commonly known as: COMPAZINE   Take 10 mg by mouth every 6 (six) hours as needed. For nausea      sodium hypochlorite external solution   Commonly known as: DAKIN'S 1/2 STRENGTH   Apply topically 2 (two) times daily.             Disposition and Follow-up:    Significant Diagnostic Studies:  X-ray Chest Pa And Lateral   07/26/2011  *RADIOLOGY REPORT*  Clinical Data: Fever, weakness.  CHEST - 2 VIEW  Comparison: 04/12/2011  Findings: Right Port-A-Cath remains in place, unchanged.  Heart is normal size.  No confluent airspace opacity or effusion.  No acute bony abnormality.  IMPRESSION: No active cardiopulmonary disease.  Original Report Authenticated By: Cyndie Chime, M.D.    Discharge Laboratory Values: @cprlabs @  Brief H and P: For complete details please refer to admission H and P, but in brief, the patient was admitted through the oncology clinic with fever and severe neutropenia, anemia, and poorly controlled pain Physical Exam at Discharge: BP 98/59  Pulse 89  Temp(Src) 100 F (37.8 C) (Oral)  Resp 16  Ht 5\' 5"  (1.651 m)  Wt 156 lb (70.761 kg)  BMI 25.96 kg/m2  SpO2 97%  LMP 07/26/2011 Gen: ambulatory Cardiovascular:RRR, no murmur appreciated Respiratory:no rales or rhonchi Gastrointestinal:soft, [place], +BS Extremities:no edema SKIN: large left breast wound  ulcerated, partially necrotic as before   Hospital Course:  Principal Problem:  *Febrile neutropenia Active Problems:  Cancer of upper-outer quadrant of female breast The patient was started on imipenem and vancomycin. She tolerated antibiotics with no side effects other than loose bowel movements and mild nausea, no vomiting. Blood pressure was borderline and patient was aggressively hydrated. She was severely anemic and received 2 units PRBCs w/o event. Chest X-ray and urine culture were negative. Blood cultures were to have been obtained prior to antibiotics being started but orders were not entered correctly and no blood cultures were obtained. The patient has remained intermittently febrile and this is felt to be due not to infection but to inflammation from her necrotic breast mass. Her white cell count has recovered and at this point it is safe for her to use naproxen  for "tumor fever." At the time of discharge the patient is ambulatory, taking po's well, and her pain is better controlled with the use of M S Contin. She agrees with discharge  Diet:  regular  Activity:  As tolerated  Condition at Discharge:   improved  Signed: Dr. Ruthann Cancer [phone]  07/29/2011, 9:18 AM

## 2011-08-02 ENCOUNTER — Ambulatory Visit: Payer: Medicaid Other | Admitting: Physician Assistant

## 2011-08-02 ENCOUNTER — Other Ambulatory Visit: Payer: Medicaid Other | Admitting: Lab

## 2011-08-02 NOTE — Progress Notes (Signed)
[facility] today.  Letter mailed to patient.  

## 2011-08-03 ENCOUNTER — Ambulatory Visit (INDEPENDENT_AMBULATORY_CARE_PROVIDER_SITE_OTHER): Payer: Medicaid Other | Admitting: Surgery

## 2011-08-03 ENCOUNTER — Encounter (INDEPENDENT_AMBULATORY_CARE_PROVIDER_SITE_OTHER): Payer: Self-pay | Admitting: Surgery

## 2011-08-03 ENCOUNTER — Ambulatory Visit: Payer: Medicaid Other

## 2011-08-03 ENCOUNTER — Other Ambulatory Visit (HOSPITAL_COMMUNITY): Payer: Medicaid Other

## 2011-08-03 VITALS — BP 100/64 | HR 78 | Temp 98.4°F | Resp 14 | Ht 65.0 in | Wt 160.2 lb

## 2011-08-03 DIAGNOSIS — C50419 Malignant neoplasm of upper-outer quadrant of unspecified female breast: Secondary | ICD-10-CM

## 2011-08-03 NOTE — Progress Notes (Signed)
The patient has finally begun chemotherapy with Dr. Darnelle Catalan. She was briefly hospitalized for transfusion and management of some symptoms, but is feeling much better.  Her tumor is responding to the chemo.  She has a large amount of necrotic tissue that we easily debrided in the room without the need for anesthesia.  Continue dressing changes with Dakins solution.  The tumor is noticeably smaller but continues to protrude.  Recheck in 2 weeks - may need further debridement.  Wilmon Arms. Corliss Skains, MD, [facility] Surgery  08/03/2011 11:21 AM

## 2011-08-04 ENCOUNTER — Ambulatory Visit: Payer: Medicaid Other

## 2011-08-09 ENCOUNTER — Ambulatory Visit: Payer: PRIVATE HEALTH INSURANCE

## 2011-08-10 ENCOUNTER — Encounter: Payer: Self-pay | Admitting: Oncology

## 2011-08-10 ENCOUNTER — Ambulatory Visit (HOSPITAL_BASED_OUTPATIENT_CLINIC_OR_DEPARTMENT_OTHER): Payer: Medicaid Other

## 2011-08-10 ENCOUNTER — Other Ambulatory Visit (HOSPITAL_BASED_OUTPATIENT_CLINIC_OR_DEPARTMENT_OTHER): Payer: Medicaid Other | Admitting: Lab

## 2011-08-10 ENCOUNTER — Ambulatory Visit (HOSPITAL_BASED_OUTPATIENT_CLINIC_OR_DEPARTMENT_OTHER): Payer: Medicaid Other | Admitting: Oncology

## 2011-08-10 ENCOUNTER — Other Ambulatory Visit: Payer: Self-pay | Admitting: *Deleted

## 2011-08-10 VITALS — BP 115/72 | HR 73 | Temp 98.8°F | Ht 65.0 in | Wt 157.4 lb

## 2011-08-10 DIAGNOSIS — C50419 Malignant neoplasm of upper-outer quadrant of unspecified female breast: Secondary | ICD-10-CM

## 2011-08-10 DIAGNOSIS — C773 Secondary and unspecified malignant neoplasm of axilla and upper limb lymph nodes: Secondary | ICD-10-CM

## 2011-08-10 DIAGNOSIS — D649 Anemia, unspecified: Secondary | ICD-10-CM

## 2011-08-10 DIAGNOSIS — Z5111 Encounter for antineoplastic chemotherapy: Secondary | ICD-10-CM

## 2011-08-10 DIAGNOSIS — C801 Malignant (primary) neoplasm, unspecified: Secondary | ICD-10-CM

## 2011-08-10 DIAGNOSIS — N6320 Unspecified lump in the left breast, unspecified quadrant: Secondary | ICD-10-CM

## 2011-08-10 DIAGNOSIS — C787 Secondary malignant neoplasm of liver and intrahepatic bile duct: Secondary | ICD-10-CM

## 2011-08-10 LAB — COMPREHENSIVE METABOLIC PANEL
Albumin: 3.1 g/dL — ABNORMAL LOW (ref 3.5–5.2)
Alkaline Phosphatase: 63 U/L (ref 39–117)
BUN: 9 mg/dL (ref 6–23)
Glucose, Bld: 125 mg/dL — ABNORMAL HIGH (ref 70–99)
Total Bilirubin: 0.1 mg/dL — ABNORMAL LOW (ref 0.3–1.2)

## 2011-08-10 LAB — CBC WITH DIFFERENTIAL/PLATELET
Basophils Absolute: 0 10*3/uL (ref 0.0–0.1)
EOS%: 0 % (ref 0.0–7.0)
HCT: 30.9 % — ABNORMAL LOW (ref 34.8–46.6)
HGB: 9.7 g/dL — ABNORMAL LOW (ref 11.6–15.9)
MCH: 22.5 pg — ABNORMAL LOW (ref 25.1–34.0)
MCV: 71.5 fL — ABNORMAL LOW (ref 79.5–101.0)
MONO%: 0.8 % (ref 0.0–14.0)
NEUT%: 93.8 % — ABNORMAL HIGH (ref 38.4–76.8)

## 2011-08-10 LAB — RETICULOCYTES
Retic %: 0.99 % (ref 0.70–2.10)
Retic Ct Abs: 42.8 10*3/uL (ref 33.70–90.70)

## 2011-08-10 LAB — FERRITIN: Ferritin: 26 ng/mL (ref 10–291)

## 2011-08-10 MED ORDER — SODIUM CHLORIDE 0.9 % IJ SOLN
10.0000 mL | INTRAMUSCULAR | Status: DC | PRN
  Administered 2011-08-10: 10 mL
  Filled 2011-08-10: qty 10

## 2011-08-10 MED ORDER — SODIUM CHLORIDE 0.9 % IV SOLN
470.0000 mg/m2 | Freq: Once | INTRAVENOUS | Status: AC
  Administered 2011-08-10: 860 mg via INTRAVENOUS
  Filled 2011-08-10: qty 43

## 2011-08-10 MED ORDER — DOXORUBICIN HCL CHEMO IV INJECTION 2 MG/ML: 50.0000 mg/m2 | Freq: Once | INTRAVENOUS | Status: DC

## 2011-08-10 MED ORDER — PALONOSETRON HCL INJECTION 0.25 MG/5ML
0.2500 mg | Freq: Once | INTRAVENOUS | Status: AC
  Administered 2011-08-10: 0.25 mg via INTRAVENOUS

## 2011-08-10 MED ORDER — SODIUM CHLORIDE 0.9 % IV SOLN
Freq: Once | INTRAVENOUS | Status: AC
  Administered 2011-08-10: 13:00:00 via INTRAVENOUS

## 2011-08-10 MED ORDER — CIPROFLOXACIN HCL 500 MG PO TABS: 500.0000 mg | ORAL_TABLET | Freq: Two times a day (BID) | ORAL | Status: DC

## 2011-08-10 MED ORDER — DOCETAXEL CHEMO INJECTION 160 MG/16ML
70.0000 mg/m2 | Freq: Once | INTRAVENOUS | Status: AC
  Administered 2011-08-10: 130 mg via INTRAVENOUS
  Filled 2011-08-10: qty 13

## 2011-08-10 MED ORDER — DOXORUBICIN HCL CHEMO IV INJECTION 2 MG/ML
47.0000 mg/m2 | Freq: Once | INTRAVENOUS | Status: AC
  Administered 2011-08-10: 86 mg via INTRAVENOUS
  Filled 2011-08-10: qty 43

## 2011-08-10 MED ORDER — SODIUM CHLORIDE 0.9 % IV SOLN: 500.0000 mg/m2 | Freq: Once | INTRAVENOUS | Status: DC

## 2011-08-10 MED ORDER — DEXAMETHASONE SODIUM PHOSPHATE 4 MG/ML IJ SOLN
12.0000 mg | Freq: Once | INTRAMUSCULAR | Status: AC
  Administered 2011-08-10: 12 mg via INTRAVENOUS

## 2011-08-10 MED ORDER — SODIUM CHLORIDE 0.9 % IV SOLN
150.0000 mg | Freq: Once | INTRAVENOUS | Status: AC
  Administered 2011-08-10: 150 mg via INTRAVENOUS
  Filled 2011-08-10: qty 5

## 2011-08-10 MED ORDER — HEPARIN SOD (PORK) LOCK FLUSH 100 UNIT/ML IV SOLN
500.0000 [IU] | Freq: Once | INTRAVENOUS | Status: AC | PRN
  Administered 2011-08-10: 500 [IU]
  Filled 2011-08-10: qty 5

## 2011-08-10 NOTE — Progress Notes (Signed)
ID: Alyssa May   DOB: Oct 11, 1965  MR#: [id number]  WUJ#:[id number]  HISTORY OF PRESENT ILLNESS: Alyssa May is a 46 year-old [place] woman who first noted a small mass in her Left breast November 2011, shortly after she stopped breast-feeding. It did not disappear with resumption of her periods and grew over the next year, and particularly since she had her miscarrriage October 2012. This brought her to the Emergency Room 03/09/2011 where she was found to have a fluctuant area in the UOQ of the Left breast measuring 8.4 cm. The tissue beneath the cystic area extending towards the axilla and medially was described as firm, but not erythematous. The cystic area was lanced and a large amount of serosanguinos material issued.  The patient was referred to Dr. Marcille Blanco who saw her 03/11/2011. By this time the fluid had largely reaccumulated.[name] He incised the lesion, again draining serosanguinous liquid, packed it, and set the patient up for Left breast US at [facility] 03/14/2011. This showed an irregularly marginated mass measuring 7.3 cm, with abnormal axillary adenopathy. On 01/1102013 bilateral mammography confirmed a high-density mass in the Left UOQ. This was biopsied, as was a Left axillary lymph node. [place] biopsies ([id number]) showed invasive ductal carcinoma, high grade, triple negative, with an Mib-1 of 97%. Subsequent treatments are as detailed below  INTERVAL HISTORY: Alyssa May returns today with her husband [name] for followup of her locally advanced left breast carcinoma. She is ready for her second cycle of neoadjuvant docetaxel/cyclophosphamide/doxorubicin today.   Interval history is remarkable for Alyssa May having been hospitalized following cycle one for febrile neutropenia. She was started on imipenem and vancomycin, and was aggressively hydrated. She received a transfusion of 2 units packed red blood cells for her severe, symptomatic anemia. Her white count recovered, and she was  subsequently discharged in stable condition.  She is feeling much better today although she is extremely tired. Other than the fever, she notes that she tolerated cycle 1 "just fine".   REVIEW OF SYSTEMS: Since hospitalization, she denies any fevers, chills, or night sweats. She's had no nausea and no change in bowel habits. Specifically, she's had no constipation despite narcotic pain meds. No cough, phlegm production, or increased shortness of breath. No pain other than in the breast. No peripheral swelling with the exception of apparent lymphedema in the left upper extremity.  A detailed review of systems is otherwise noncontributory.  PAST MEDICAL HISTORY: Past Medical History  Diagnosis Date  . Breast abscess   . No pertinent past medical history   . Cancer     lt br     PAST SURGICAL HISTORY: Past Surgical History  Procedure Date  . Dilation and curettage of uterus 01/04/11  . Breast mass excision 2013  . Tonsillectomy   . Portacath placement 04/12/2011    Procedure: INSERTION PORT-A-CATH;  Surgeon: Wilmon Arms. Tsuei, MD;  Location: [facility] ORS;  Service: General;  Laterality: Right;  right subclavian  . Incision and drainage breast abscess 03/31/11    FAMILY HISTORY Family History  Problem Relation Age of Onset  . Diabetes Mother   . Hypertension Mother   the patient's father died from post-operative complications at age 23; the patient's mother is 70, lives in [place] The patient is a single child  GYNECOLOGIC HISTORY: first pregnancy to term age 87. 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 Her husband  [employer] is a Hospital doctor for a Acupuncturist in [place]. Their daughter, [name], is 3. The family stays part-time with the patient's mother in [place]    ADVANCED DIRECTIVES:  HEALTH MAINTENANCE: History  Substance Use Topics  . Smoking status: Never Smoker    . Smokeless tobacco: Never Used  . Alcohol Use: Yes     occasional     Colonoscopy:  PAP:  Bone density:  Lipid panel:  Allergies  Allergen Reactions  . Sulfa Antibiotics Swelling    Current Outpatient Prescriptions  Medication Sig Dispense Refill  . oxyCODONE-acetaminophen (PERCOCET) 5-325 MG per tablet       . ciprofloxacin (CIPRO) 500 MG tablet Take 1 tablet (500 mg total) by mouth 2 (two) times daily.  14 tablet  0  . lidocaine-prilocaine (EMLA) cream Apply 1 application topically as needed. To port      . loratadine (CLARITIN) 10 MG tablet Take 10 mg by mouth daily.      [name] LORazepam (ATIVAN) 0.5 MG tablet Take 0.5 mg by mouth at bedtime as needed.      [name] morphine (MS CONTIN) 15 MG 12 hr tablet Take 1 tablet (15 mg total) by mouth 2 (two) times daily.  60 tablet  0  . naproxen sodium (ALEVE) 220 MG tablet Take 1 tablet (220 mg total) by mouth 3 (three) times daily with meals as needed (for fever or pain).  60 tablet  0  . ondansetron (ZOFRAN) 8 MG tablet Take by mouth every 8 (eight) hours as needed.      . prochlorperazine (COMPAZINE) 10 MG tablet Take 10 mg by mouth every 6 (six) hours as needed. For nausea       No current facility-administered medications for this visit.   Facility-Administered Medications Ordered in Other Visits  Medication Dose Route Frequency Provider Last Rate Last Dose  . 0.9 %  sodium chloride infusion   Intravenous Once Lowella Dell, MD 20 mL/hr at 08/10/11 1323    . cyclophosphamide (CYTOXAN) 860 mg in sodium chloride 0.9 % 250 mL chemo infusion  470 mg/m2 (Order-Specific) Intravenous Once Lowella Dell, MD 586 mL/hr at 08/10/11 1552 860 mg at 08/10/11 1552  . dexamethasone (DECADRON) injection 12 mg  12 mg Intravenous Once Lowella Dell, MD   12 mg at 08/10/11 1343  . DOCEtaxel (TAXOTERE) 130 mg in dextrose 5 % 250 mL chemo infusion  70 mg/m2 (Treatment Plan Actual) Intravenous Once Lowella Dell, MD 263 mL/hr at 08/10/11 1445  130 mg at 08/10/11 1445  . DOXOrubicin (ADRIAMYCIN) chemo injection 86 mg  47 mg/m2 (Order-Specific) Intravenous Once Lowella Dell, MD   86 mg at 08/10/11 1429  . fosaprepitant (EMEND) 150 mg in sodium chloride 0.9 % 145 mL IVPB  150 mg Intravenous Once Lowella Dell, MD 300 mL/hr at 08/10/11 1343 150 mg at 08/10/11 1343  . heparin lock flush 100 unit/mL  500 Units Intracatheter Once PRN Lowella Dell, MD      . palonosetron (ALOXI) injection 0.25 mg  0.25 mg Intravenous Once Lowella Dell, MD   0.25 mg at 08/10/11 1340  . sodium chloride 0.9 % injection 10 mL  10 mL Intracatheter PRN Lowella Dell, MD      . DISCONTD: cyclophosphamide (CYTOXAN) 940 mg in sodium chloride 0.9 % 250 mL chemo infusion  500 mg/m2 (Treatment Plan Actual) Intravenous Once Lowella Dell, MD      . DISCONTD: DOXOrubicin (ADRIAMYCIN) chemo injection  94 mg  50 mg/m2 (Treatment Plan Actual) Intravenous Once Lowella Dell, MD        OBJECTIVE:  Filed Vitals:   08/10/11 1249  BP: 115/72  Pulse: 73  Temp: 98.8 F (37.1 C)     Body mass index is 26.19 kg/(m^2).    ECOG FS: 1  Filed Weights   08/10/11 1249  Weight: 157 lb 6.4 oz (71.396 kg)  Patient is examined in the infusion room while receiving chemotherapy.  She is a young [place] American woman who appears comfortable and in no acute distress.  Physical Exam: HEENT:  Sclerae anicteric, conjunctivae pink.   Breast Exam:  Deferred Lungs:  Clear to auscultation bilaterally.  No crackles, rhonchi, or wheezes.   Heart:  Regular rate and rhythm.   Abdomen:  Soft, nontender.  Positive bowel sounds.    Extremities:  Benign.  No peripheral edema or cyanosis.   Skin:  Benign.   Neuro:  Nonfocal. Alert and oriented x3.    LAB RESULTS: Lab Results  Component Value Date   WBC 10.6* 08/10/2011   NEUTROABS 9.9* 08/10/2011   HGB 9.7* 08/10/2011   HCT 30.9* 08/10/2011   MCV 71.5* 08/10/2011   PLT 855* 08/10/2011      Chemistry      Component  Value Date/Time   NA 138 08/10/2011 1221   K 4.1 08/10/2011 1221   CL 103 08/10/2011 1221   CO2 26 08/10/2011 1221   BUN 9 08/10/2011 1221   CREATININE 0.54 08/10/2011 1221      Component Value Date/Time   CALCIUM 9.4 08/10/2011 1221   ALKPHOS 63 08/10/2011 1221   AST 15 08/10/2011 1221   ALT 8 08/10/2011 1221   BILITOT 0.1* 08/10/2011 1221       Lab Results  Component Value Date   LABCA2 13 07/29/2011     STUDIES:  07/26/2011 CHEST - 2 VIEW  Comparison: 04/12/2011  Findings: Right Port-A-Cath remains in place, unchanged. Heart is  normal size. No confluent airspace opacity or effusion. No acute  bony abnormality.  IMPRESSION:  No active cardiopulmonary disease.  Original Report Authenticated By: Cyndie Chime, M.D.    ASSESSMENT: 46 year-old [place] woman with stage IV breast cancer,   (1)  s/p Left breast biopsy 03/18/2011 showing a clinical T4,N1 invasive ductal carcinoma, high grade, triple negative, with an Mib-1 of 97%, with a single liver metastasis pathologically confirmed 03/31/2011  (2) receiving neoadjuvant chemotherapy, the goal being to complete 6 q. three-week doses of docetaxel/cyclophosphamide/doxorubicin, with Neulasta on day 2 for granulocyte support.  (3)  status post hospitalization for febrile neutropenia following cycle 1.  PLAN:  Alyssa May will complete her second cycle of neoadjuvant docetaxel/cyclophosphamide/doxorubicin. She will receive Neulasta tomorrow for granulocyte support.  Due to the febrile neutropenia requiring hospitalization following cycle one, I have advised her to begin on Cipro prophylactically on day 6 which will be Monday, June 10, and she will continue at 500 mg by mouth twice a day x7 days. I will be seeing her on Wednesday, June 12, for repeat labs and physical exam for assessment of chemotoxicity.  She and her husband [name] both voice understanding and agreement and will call with any changes or problems.  Jacion Dismore    08/10/2011

## 2011-08-10 NOTE — Patient Instructions (Addendum)
 Cancer Center Discharge Instructions for Patients Receiving Chemotherapy  Today you received the following chemotherapy agents Cytoxan, Taxotere, Adriamycin  To help prevent nausea and vomiting after your treatment, we encourage you to take your nausea medication as prescripted  If you develop nausea and vomiting that is not controlled by your nausea medication, call the clinic. If it is after clinic hours your family physician or the after hours number for the clinic or go to the Emergency Department.   BELOW ARE SYMPTOMS THAT SHOULD BE REPORTED IMMEDIATELY:  *FEVER GREATER THAN 100.5 F  *CHILLS WITH OR WITHOUT FEVER  NAUSEA AND VOMITING THAT IS NOT CONTROLLED WITH YOUR NAUSEA MEDICATION  *UNUSUAL SHORTNESS OF BREATH  *UNUSUAL BRUISING OR BLEEDING  TENDERNESS IN MOUTH AND THROAT WITH OR WITHOUT PRESENCE OF ULCERS  *URINARY PROBLEMS  *BOWEL PROBLEMS  UNUSUAL RASH Items with * indicate a potential emergency and should be followed up as soon as possible.  One of the nurses will contact you 24 hours after your treatment. Please let the nurse know about any problems that you may have experienced. Feel free to call the clinic you have any questions or concerns. The clinic phone number is [phone].   I have been informed and understand all the instructions given to me. I know to contact the clinic, my physician, or go to the Emergency Department if any problems should occur. I do not have any questions at this time, but understand that I may call the clinic during office hours   should I have any questions or need assistance in obtaining follow up care.    __________________________________________  _____________  __________ Signature of Patient or Authorized Representative            Date                   Time    __________________________________________ Nurse's Signature   Patient aware of next appointment; discharged home with no complaints.

## 2011-08-11 ENCOUNTER — Other Ambulatory Visit: Payer: Medicaid Other | Admitting: Lab

## 2011-08-11 ENCOUNTER — Ambulatory Visit (HOSPITAL_BASED_OUTPATIENT_CLINIC_OR_DEPARTMENT_OTHER): Payer: Medicaid Other

## 2011-08-11 ENCOUNTER — Ambulatory Visit: Payer: Medicaid Other | Admitting: Oncology

## 2011-08-11 VITALS — BP 113/66 | HR 57 | Temp 97.3°F

## 2011-08-11 DIAGNOSIS — C787 Secondary malignant neoplasm of liver and intrahepatic bile duct: Secondary | ICD-10-CM

## 2011-08-11 DIAGNOSIS — N6320 Unspecified lump in the left breast, unspecified quadrant: Secondary | ICD-10-CM

## 2011-08-11 DIAGNOSIS — Z5189 Encounter for other specified aftercare: Secondary | ICD-10-CM

## 2011-08-11 DIAGNOSIS — C801 Malignant (primary) neoplasm, unspecified: Secondary | ICD-10-CM

## 2011-08-11 DIAGNOSIS — C773 Secondary and unspecified malignant neoplasm of axilla and upper limb lymph nodes: Secondary | ICD-10-CM

## 2011-08-11 DIAGNOSIS — C50419 Malignant neoplasm of upper-outer quadrant of unspecified female breast: Secondary | ICD-10-CM

## 2011-08-11 MED ORDER — PEGFILGRASTIM INJECTION 6 MG/0.6ML
6.0000 mg | Freq: Once | SUBCUTANEOUS | Status: AC
  Administered 2011-08-11: 6 mg via SUBCUTANEOUS
  Filled 2011-08-11: qty 0.6

## 2011-08-15 ENCOUNTER — Encounter (INDEPENDENT_AMBULATORY_CARE_PROVIDER_SITE_OTHER): Payer: Self-pay | Admitting: Surgery

## 2011-08-15 ENCOUNTER — Ambulatory Visit (INDEPENDENT_AMBULATORY_CARE_PROVIDER_SITE_OTHER): Payer: Medicaid Other | Admitting: Surgery

## 2011-08-15 VITALS — BP 108/63 | HR 90 | Temp 97.6°F | Ht 65.0 in | Wt 165.6 lb

## 2011-08-15 DIAGNOSIS — C50419 Malignant neoplasm of upper-outer quadrant of unspecified female breast: Secondary | ICD-10-CM

## 2011-08-15 NOTE — Progress Notes (Signed)
She is tolerating her ongoing chemotherapy.  She had a treatment last week but seems to be doing quite well. The wound is significantly improved. She continues to use Dakin solution. The central 3 cm seems necrotic and we sharply debrided this.  The peripheral 2-3 cm all the way around is well-granulated.  The port site looks good.  Continue dressing changes.  Follow-up 2 weeks in case she needs more debridement.  Wilmon Arms. Corliss Skains, MD, [facility] Surgery  08/15/2011 2:21 PM

## 2011-08-17 ENCOUNTER — Ambulatory Visit (HOSPITAL_BASED_OUTPATIENT_CLINIC_OR_DEPARTMENT_OTHER): Payer: Medicaid Other | Admitting: Physician Assistant

## 2011-08-17 ENCOUNTER — Other Ambulatory Visit (HOSPITAL_BASED_OUTPATIENT_CLINIC_OR_DEPARTMENT_OTHER): Payer: Medicaid Other | Admitting: Lab

## 2011-08-17 ENCOUNTER — Encounter: Payer: Self-pay | Admitting: Physician Assistant

## 2011-08-17 ENCOUNTER — Telehealth: Payer: Self-pay | Admitting: Oncology

## 2011-08-17 VITALS — BP 109/75 | HR 94 | Temp 98.8°F | Ht 65.0 in | Wt 160.8 lb

## 2011-08-17 DIAGNOSIS — C50419 Malignant neoplasm of upper-outer quadrant of unspecified female breast: Secondary | ICD-10-CM

## 2011-08-17 DIAGNOSIS — D649 Anemia, unspecified: Secondary | ICD-10-CM

## 2011-08-17 LAB — CBC WITH DIFFERENTIAL/PLATELET
BASO%: 0.2 % (ref 0.0–2.0)
EOS%: 0.7 % (ref 0.0–7.0)
LYMPH%: 20.6 % (ref 14.0–49.7)
MCHC: 31.6 g/dL (ref 31.5–36.0)
MONO#: 0.2 10*3/uL (ref 0.1–0.9)
Platelets: 398 10*3/uL (ref 145–400)
RBC: 4.13 10*6/uL (ref 3.70–5.45)
WBC: 3 10*3/uL — ABNORMAL LOW (ref 3.9–10.3)

## 2011-08-17 NOTE — Progress Notes (Signed)
ID: [name]   DOB: 12/17/1965  MR#: [id number]  NWG#:[id number]  HISTORY OF PRESENT ILLNESS: Camdyn Laden is a 46 year-old [place] woman who first noted a small mass in her Left breast November 2011, shortly after she stopped breast-feeding. It did not disappear with resumption of her periods and grew over the next year, and particularly since she had her miscarrriage October 2012. This brought her to the Emergency Room 03/09/2011 where she was found to have a fluctuant area in the UOQ of the Left breast measuring 8.4 cm. The tissue beneath the cystic area extending towards the axilla and medially was described as firm, but not erythematous. The cystic area was lanced and a large amount of serosanguinos material issued.  The patient was referred to Dr. Marcille Blanco who saw her 03/11/2011. By this time the fluid had largely reaccumulated.[name] He incised the lesion, again draining serosanguinous liquid, packed it, and set the patient up [facility] 03[facility]14[facility]2011. This showed an irregularly marginated mass measuring 7.3 cm, with abnormal axillary adenopathy. On 01/1102013 bilateral mammography confirmed a high-density mass in the Left UOQ. This was biopsied, as was a Left axillary lymph node. [facility] biopsies ([id number]) showed invasive ductal carcinoma, high grade, triple negative, with an Mib-1 of 97%. Subsequent treatments are as detailed below  INTERVAL HISTORY: Angeliz returns today with her mother in law for assessment of chemotoxicity on day 8 cycle 2 of 6 planned q. three-week doses of neoadjuvant docetaxel/cyclophosphamide/doxorubicin. She received Neulasta on day 2 for granulocyte support. She started Cipro prophylactically on day 6 (Monday, June 10), due to her history of febrile neutropenia requiring hospitalization after cycle 1.  Interval history is remarkable for Ramsha having been seen by her surgeon, Dr. Corliss Skains.  She tells me he is very pleased with the results thus far,  and Zhania herself is also pleased. She is  feeling very positive about the chemotherapy, which she seems to be tolerating well.  REVIEW OF SYSTEMS: Shakaya has had no fevers, chills, or night sweats. Her energy level is slightly reduced. She started her menstrual cycle this past week, approximately June 8. She's had some slight pelvic cramping, but tells me the flow is not especially heavy. She's had no additional bleeding elsewhere. She's had no nausea and no change in bowel habits. Specifically, she's had no constipation despite narcotic pain meds. Her pain is very well-controlled. No cough, phlegm production, or increased shortness of breath. No chest pain or palpitations.  A detailed review of systems is otherwise noncontributory.  PAST MEDICAL HISTORY: Past Medical History  Diagnosis Date  . Breast abscess   . No pertinent past medical history   . Cancer     lt br     PAST SURGICAL HISTORY: Past Surgical History  Procedure Date  . Dilation and curettage of uterus 01/04/11  . Breast mass excision 2013  . Tonsillectomy   . Portacath placement 04/12/2011    Procedure: INSERTION PORT-A-CATH;  Surgeon: Wilmon Arms. Tsuei, [place];  Location: [facility];  Service: General;  Laterality: Right;  right subclavian  . Incision and drainage breast abscess 03/31/11    FAMILY HISTORY Family History  Problem Relation Age of Onset  . Diabetes Mother   . Hypertension Mother   the patient's father died from post-operative complications at age 30; the patient's mother is 46, lives in [place] The patient is a single child  GYNECOLOGIC HISTORY: first pregnancy to term age 73. 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 Her husband Shamya Macfadden is a Hospital doctor for a Acupuncturist in [place]. Their daughter, [name], is 3. The family stays part-time with the patient's mother in [place]    ADVANCED  DIRECTIVES:  HEALTH MAINTENANCE: History  Substance Use Topics  . Smoking status: Never Smoker   . Smokeless tobacco: Never Used  . Alcohol Use: Yes     occasional     Colonoscopy:  PAP:  Bone density:  Lipid panel:  Allergies  Allergen Reactions  . Sulfa Antibiotics Swelling    Current Outpatient Prescriptions  Medication Sig Dispense Refill  . ciprofloxacin (CIPRO) 500 MG tablet Take 1 tablet (500 mg total) by mouth 2 (two) times daily.  14 tablet  0  . lidocaine-prilocaine (EMLA) cream Apply 1 application topically as needed. To port      . loratadine (CLARITIN) 10 MG tablet Take 10 mg by mouth daily.      [name] LORazepam (ATIVAN) 0.5 MG tablet Take 0.5 mg by mouth at bedtime as needed.      [name] morphine (MS CONTIN) 15 MG 12 hr tablet Take 1 tablet (15 mg total) by mouth 2 (two) times daily.  60 tablet  0  . naproxen sodium (ALEVE) 220 MG tablet Take 1 tablet (220 mg total) by mouth 3 (three) times daily with meals as needed (for fever or pain).  60 tablet  0  . ondansetron (ZOFRAN) 8 MG tablet Take by mouth every 8 (eight) hours as needed.      [name] oxyCODONE-acetaminophen (PERCOCET) 5-325 MG per tablet       . prochlorperazine (COMPAZINE) 10 MG tablet Take 10 mg by mouth every 6 (six) hours as needed. For nausea        OBJECTIVE:  Filed Vitals:   08/17/11 1540  BP: 109/75  Pulse: 94  Temp: 98.8 F (37.1 C)     Body mass index is 26.76 kg/(m^2).    ECOG FS: 1 Patient is a young [place] American woman who appears comfortable and in no acute distress.  Filed Weights   08/17/11 1540  Weight: 160 lb 12.8 oz (72.938 kg)   Physical Exam: HEENT:  Sclerae anicteric, conjunctivae pink.  Oropharynx clear.  No mucositis or candidiasis.   Nodes:  No cervical, supraclavicular, or axillary lymphadenopathy palpated.  Breast Exam:  Deferred, although with observation the size of the left breast does seem to have decreased.   Lungs:  Clear to auscultation bilaterally.  No crackles,  rhonchi, or wheezes.   Heart:  Regular rate and rhythm.   Abdomen:  Soft, nontender.  Positive bowel sounds.  No organomegaly or masses palpated.   Musculoskeletal:  No focal spinal tenderness to palpation.  Extremities:  Lymphedema noted in the left upper extremity, slightly improved. Otherwise no additional peripheral edema and no peripheral cyanosis.   Skin:  Benign.  Slightly hyperpigmented on the upper extremities and nailbeds. Neuro:  Nonfocal. Alert and oriented x3.     LAB RESULTS: Lab Results  Component Value Date   WBC 3.0* 08/17/2011   NEUTROABS 2.1 08/17/2011   HGB 9.3* 08/17/2011   HCT 29.4* 08/17/2011   MCV 71.2* 08/17/2011   PLT 398 08/17/2011      Chemistry      Component Value Date/Time   NA 138 08/10/2011 1221   K 4.1 08/10/2011 1221   CL 103 08/10/2011 1221  CO2 26 08/10/2011 1221   BUN 9 08/10/2011 1221   CREATININE 0.54 08/10/2011 1221      Component Value Date/Time   CALCIUM 9.4 08/10/2011 1221   ALKPHOS 63 08/10/2011 1221   AST 15 08/10/2011 1221   ALT 8 08/10/2011 1221   BILITOT 0.1* 08/10/2011 1221       Lab Results  Component Value Date   LABCA2 13 07/29/2011   Ferritin on 08/10/11 was at the low end of normal at 26.  This was down from 234 previously.  STUDIES:  07/26/2011 CHEST - 2 VIEW  Comparison: 04/12/2011  Findings: Right Port-A-Cath remains in place, unchanged. Heart is  normal size. No confluent airspace opacity or effusion. No acute  bony abnormality.  IMPRESSION:  No active cardiopulmonary disease.  Original Report Authenticated By: Cyndie Chime, M.D.    ASSESSMENT: 46 year-old [place] woman with stage IV breast cancer,   (1)  s/p Left breast biopsy 03/18/2011 showing a clinical T4,N1 invasive ductal carcinoma, high grade, triple negative, with an Mib-1 of 97%, with a single liver metastasis pathologically confirmed 03/31/2011  (2) receiving neoadjuvant chemotherapy, the goal being to complete 6 q. three-week doses of  docetaxel/cyclophosphamide/doxorubicin, with Neulasta on day 2 for granulocyte support.  (3)  status post hospitalization for febrile neutropenia following cycle 1.  (4) Symptomatic anemia  PLAN:  Adaijah seems to be tolerating her chemotherapy extremely well. We will continue to see her on a weekly basis with close followup. She'll discontinue her prophylactic Cipro with an ANC today of 2.1. I will see her again next week on June 19, and when she returns today we will also give her a dose of feraheme for her low iron and symptomatic anemia.   Kersti voices her understanding and agreement, and will call with any changes or problems prior to her next scheduled appointment.   Shaunita Seney    08/17/2011

## 2011-08-17 NOTE — Telephone Encounter (Signed)
gve the pt her June 2013 appt calendar 

## 2011-08-18 ENCOUNTER — Telehealth: Payer: Self-pay | Admitting: *Deleted

## 2011-08-18 NOTE — Telephone Encounter (Signed)
Per staff message I have scheduled appts. JMW  

## 2011-08-24 ENCOUNTER — Ambulatory Visit (HOSPITAL_BASED_OUTPATIENT_CLINIC_OR_DEPARTMENT_OTHER): Payer: Medicaid Other | Admitting: Physician Assistant

## 2011-08-24 ENCOUNTER — Other Ambulatory Visit (HOSPITAL_BASED_OUTPATIENT_CLINIC_OR_DEPARTMENT_OTHER): Payer: Medicaid Other | Admitting: Lab

## 2011-08-24 ENCOUNTER — Other Ambulatory Visit: Payer: Self-pay | Admitting: *Deleted

## 2011-08-24 ENCOUNTER — Ambulatory Visit: Payer: Medicaid Other

## 2011-08-24 ENCOUNTER — Ambulatory Visit (HOSPITAL_BASED_OUTPATIENT_CLINIC_OR_DEPARTMENT_OTHER): Payer: Medicaid Other

## 2011-08-24 ENCOUNTER — Encounter: Payer: Self-pay | Admitting: Physician Assistant

## 2011-08-24 VITALS — BP 100/66 | HR 81 | Temp 97.0°F

## 2011-08-24 VITALS — BP 115/80 | HR 90 | Temp 97.9°F | Ht 65.0 in | Wt 159.7 lb

## 2011-08-24 DIAGNOSIS — C50419 Malignant neoplasm of upper-outer quadrant of unspecified female breast: Secondary | ICD-10-CM

## 2011-08-24 DIAGNOSIS — D649 Anemia, unspecified: Secondary | ICD-10-CM

## 2011-08-24 LAB — COMPREHENSIVE METABOLIC PANEL
ALT: 9 U/L (ref 0–35)
AST: 13 U/L (ref 0–37)
CO2: 26 mEq/L (ref 19–32)
Calcium: 8.5 mg/dL (ref 8.4–10.5)
Chloride: 105 mEq/L (ref 96–112)
Potassium: 3.9 mEq/L (ref 3.5–5.3)
Sodium: 142 mEq/L (ref 135–145)
Total Protein: 6.3 g/dL (ref 6.0–8.3)

## 2011-08-24 LAB — CBC WITH DIFFERENTIAL/PLATELET
BASO%: 0.3 % (ref 0.0–2.0)
HCT: 31.2 % — ABNORMAL LOW (ref 34.8–46.6)
MCHC: 31.1 g/dL — ABNORMAL LOW (ref 31.5–36.0)
MONO#: 0.3 10*3/uL (ref 0.1–0.9)
RBC: 4.33 10*6/uL (ref 3.70–5.45)
RDW: 26.1 % — ABNORMAL HIGH (ref 11.2–14.5)
WBC: 6 10*3/uL (ref 3.9–10.3)
lymph#: 0.8 10*3/uL — ABNORMAL LOW (ref 0.9–3.3)

## 2011-08-24 MED ORDER — HEPARIN SOD (PORK) LOCK FLUSH 100 UNIT/ML IV SOLN
500.0000 [IU] | Freq: Once | INTRAVENOUS | Status: AC | PRN
Start: 2011-08-24 — End: 2011-08-24
  Administered 2011-08-24: 500 [IU]
  Filled 2011-08-24: qty 5

## 2011-08-24 MED ORDER — SODIUM CHLORIDE 0.9 % IJ SOLN
10.0000 mL | INTRAMUSCULAR | Status: DC | PRN
  Administered 2011-08-24: 10 mL
  Filled 2011-08-24: qty 10

## 2011-08-24 MED ORDER — LIDOCAINE-PRILOCAINE 2.5-2.5 % EX CREA
TOPICAL_CREAM | Freq: Once | CUTANEOUS | Status: AC
  Administered 2011-08-24: 12:00:00 via TOPICAL

## 2011-08-24 MED ORDER — SODIUM CHLORIDE 0.9 % IV SOLN
Freq: Once | INTRAVENOUS | Status: AC
  Administered 2011-08-24: 13:00:00 via INTRAVENOUS

## 2011-08-24 MED ORDER — FERUMOXYTOL INJECTION 510 MG/17 ML
510.0000 mg | Freq: Once | INTRAVENOUS | Status: AC
  Administered 2011-08-24: 510 mg via INTRAVENOUS
  Filled 2011-08-24: qty 17

## 2011-08-24 NOTE — Progress Notes (Signed)
ID: [name]   DOB: 02-14-1966  MR#: [id number]  WUJ#:[id number]  HISTORY OF PRESENT ILLNESS: Latara Micheli is a 46 year-old [place] woman who first noted a small mass in her Left breast November 2011, shortly after she stopped breast-feeding. It did not disappear with resumption of her periods and grew over the next year, and particularly since she had her miscarrriage October 2012. This brought her to the Emergency Room 03/09/2011 where she was found to have a fluctuant area in the UOQ of the Left breast measuring 8.4 cm. The tissue beneath the cystic area extending towards the axilla and medially was described as firm, but not erythematous. The cystic area was lanced and a large amount of serosanguinos material issued.  The patient was referred to Dr. Marcille Blanco who saw her 03/11/2011. By this time the fluid had largely reaccumulated.[name] He incised the lesion, again draining serosanguinous liquid, packed it, and set the patient up for [facility] 03[facility]14/2011. This showed an irregularly marginated mass measuring 7.3 cm, with abnormal axillary adenopathy. On 01/1102013 bilateral mammography confirmed a high-density mass in the Left UOQ. This was biopsied, as was a Left axillary lymph node. [facility] biopsies ([id number]) showed invasive ductal carcinoma, high grade, triple negative, with an Mib-1 of 97%. Subsequent treatments are as detailed below  INTERVAL HISTORY: Axelle returns today for followup of her locally advanced left breast carcinoma. She's currently day 15 cycle 2 of neoadjuvant docetaxel/cyclophosphamide/doxorubicin which she is tolerating quite well. She does have symptomatic anemia with fatigue and some shortness of breath with exertion. Her iron level had dropped significantly when checked a couple of weeks ago, and she is due for her first dose of Feraheme today.   Otherwise they continue to work to get settled in their new home. Her mother-in-law is still visiting  which she finds helpful. Interval history is otherwise unremarkable.  REVIEW OF SYSTEMS: Takoya has had no fevers, chills, or night sweats. She had a  menstrual cycle on June 8, the flow being lighter than usual.  She's had no additional bleeding elsewhere. She's had no nausea and no change in bowel habits. Specifically, she's had no constipation despite narcotic pain meds. Her pain is very well-controlled. No cough, phlegm production, or increased shortness of breath. No chest pain or palpitations. No mouth ulcers or oral sensitivity. No signs of numbness or tingling in either the upper or lower extremities.  A detailed review of systems is otherwise noncontributory.  PAST MEDICAL HISTORY: Past Medical History  Diagnosis Date  . Breast abscess   . No pertinent past medical history   . Cancer     lt br     PAST SURGICAL HISTORY: Past Surgical History  Procedure Date  . Dilation and curettage of uterus 01/04/11  . Breast mass excision 2013  . Tonsillectomy   . Portacath placement 04/12/2011    Procedure: INSERTION PORT-A-CATH;  Surgeon: Wilmon Arms. Tsuei, [place];  Location: [facility];  Service: General;  Laterality: Right;  right subclavian  . Incision and drainage breast abscess 03/31/11    FAMILY HISTORY Family History  Problem Relation Age of Onset  . Diabetes Mother   . Hypertension Mother   the patient's father died from post-operative complications at age 79; the patient's mother is 68, lives in [place] The patient is a single child  GYNECOLOGIC HISTORY: first pregnancy to term age 39. 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 area. Her husband Lucilia Yanni is a Hospital doctor for a Acupuncturist in [place]. Their daughter, [name], is 3. The family stays part-time with the patient's mother in [place]    ADVANCED DIRECTIVES:  HEALTH MAINTENANCE: History  Substance Use Topics  .  Smoking status: Never Smoker   . Smokeless tobacco: Never Used  . Alcohol Use: Yes     occasional     Colonoscopy:  PAP:  Bone density:  Lipid panel:  Allergies  Allergen Reactions  . Sulfa Antibiotics Swelling    Current Outpatient Prescriptions  Medication Sig Dispense Refill  . lidocaine-prilocaine (EMLA) cream Apply 1 application topically as needed. To port      . loratadine (CLARITIN) 10 MG tablet Take 10 mg by mouth daily.      [name] LORazepam (ATIVAN) 0.5 MG tablet Take 0.5 mg by mouth at bedtime as needed.      [name] morphine (MS CONTIN) 15 MG 12 hr tablet Take 1 tablet (15 mg total) by mouth 2 (two) times daily.  60 tablet  0  . naproxen sodium (ALEVE) 220 MG tablet Take 1 tablet (220 mg total) by mouth 3 (three) times daily with meals as needed (for fever or pain).  60 tablet  0  . ondansetron (ZOFRAN) 8 MG tablet Take by mouth every 8 (eight) hours as needed.      [name] oxyCODONE-acetaminophen (PERCOCET) 5-325 MG per tablet       . prochlorperazine (COMPAZINE) 10 MG tablet Take 10 mg by mouth every 6 (six) hours as needed. For nausea       No current facility-administered medications for this visit.   Facility-Administered Medications Ordered in Other Visits  Medication Dose Route Frequency Provider Last Rate Last Dose  . lidocaine-prilocaine (EMLA) cream   Topical Once Lowella Dell, MD        OBJECTIVE:  Filed Vitals:   08/24/11 1157  BP: 115/80  Pulse: 90  Temp: 97.9 F (36.6 C)     Body mass index is 26.58 kg/(m^2).    ECOG FS: 1 Patient is a young [place] woman who appears comfortable and in no acute distress.  Filed Weights   08/24/11 1157  Weight: 159 lb 11.2 oz (72.439 kg)   Physical Exam: HEENT:  Sclerae anicteric, conjunctivae pink.  Oropharynx clear.  No mucositis or candidiasis.   Nodes:  No cervical, supraclavicular, or axillary lymphadenopathy palpated.  Breast Exam:  Deferred, although with general observation, the size of the left breast  does seem to have decreased.  Clean dry dressing is intact. Lungs:  Clear to auscultation bilaterally.  No crackles, rhonchi, or wheezes.   Heart:  Regular rate and rhythm.   Abdomen:  Soft, nontender.  Positive bowel sounds.  No organomegaly or masses palpated.   Musculoskeletal:  No focal spinal tenderness to palpation.  Extremities:  Lymphedema noted in the left upper extremity,  improved. Otherwise no additional peripheral edema and no peripheral cyanosis.   Skin:  Benign.  Slightly hyperpigmented on the upper extremities and nailbeds. Neuro:  Nonfocal. Alert and oriented x3.     LAB RESULTS: Lab Results  Component Value Date   WBC 6.0 08/24/2011   NEUTROABS 4.8 08/24/2011   HGB 9.7* 08/24/2011   HCT 31.2* 08/24/2011   MCV 72.1* 08/24/2011   PLT 302 08/24/2011      Chemistry      Component Value Date/Time   NA 138 08/10/2011 1221  K 4.1 08/10/2011 1221   CL 103 08/10/2011 1221   CO2 26 08/10/2011 1221   BUN 9 08/10/2011 1221   CREATININE 0.54 08/10/2011 1221      Component Value Date/Time   CALCIUM 9.4 08/10/2011 1221   ALKPHOS 63 08/10/2011 1221   AST 15 08/10/2011 1221   ALT 8 08/10/2011 1221   BILITOT 0.1* 08/10/2011 1221       Lab Results  Component Value Date   LABCA2 13 07/29/2011   Ferritin on 08/10/11 was at the low end of normal at 26.  This was down from 234 on 07/27/2011.  STUDIES:  07/26/2011 CHEST - 2 VIEW  Comparison: 04/12/2011  Findings: Right Port-A-Cath remains in place, unchanged. Heart is  normal size. No confluent airspace opacity or effusion. No acute  bony abnormality.  IMPRESSION:  No active cardiopulmonary disease.  Original Report Authenticated By: Cyndie Chime, M.D.    ASSESSMENT: 46 year-old [place] woman with stage IV breast cancer,   (1)  s/p Left breast biopsy 03/18/2011 showing a clinical T4,N1 invasive ductal carcinoma, high grade, triple negative, with an Mib-1 of 97%, with a single liver metastasis pathologically confirmed  03/31/2011  (2) receiving neoadjuvant chemotherapy, the goal being to complete 6 q. three-week doses of docetaxel/cyclophosphamide/doxorubicin, with Neulasta on day 2 for granulocyte support.  (3)  status post hospitalization for febrile neutropenia following cycle 1.  (4) Symptomatic anemia  PLAN:  Alayne will receive a dose of Feraheme today for her low iron and her symptomatic anemia.  She is scheduled to see Dr. Corliss Skains later this week and will return to see Dr. Darnelle Catalan next week on June 26 prior to her third dose of chemotherapy.   Tinsley voices her understanding and agreement, and will call with any changes or problems prior to her next scheduled appointment.   Sten Dematteo    08/24/2011

## 2011-08-24 NOTE — Progress Notes (Signed)
1345 30-minute post feraheme observation patient VSS. Patient tolerated treatment well with no complaints. Discharged ambulating and AVS given.

## 2011-08-24 NOTE — Patient Instructions (Addendum)
Ferumoxytol injection What is this medicine? FERUMOXYTOL is an iron complex. Iron is used to make healthy red blood cells, which carry oxygen and nutrients throughout the body. This medicine is used to treat iron deficiency anemia in people with chronic kidney disease. This medicine may be used for other purposes; ask your health care provider or pharmacist if you have questions. What should I tell my health care provider before I take this medicine? They need to know if you have any of these conditions: -anemia not caused by low iron levels -high levels of iron in the blood -magnetic resonance imaging (MRI) test scheduled -an unusual or allergic reaction to iron, other medicines, foods, dyes, or preservatives -pregnant or trying to get pregnant -breast-feeding How should I use this medicine? This medicine is for infusion into a vein. It is given by a health care professional in a hospital or clinic setting. Talk to your pediatrician regarding the use of this medicine in children. Special care may be needed. Overdosage: If you think you've taken too much of this medicine contact a poison control center or emergency room at once. Overdosage: If you think you have taken too much of this medicine contact a poison control center or emergency room at once. NOTE: This medicine is only for you. Do not share this medicine with others. What if I miss a dose? It is important not to miss your dose. Call your doctor or health care professional if you are unable to keep an appointment. What may interact with this medicine? This medicine may interact with the following medications: -other iron products This list may not describe all possible interactions. Give your health care provider a list of all the medicines, herbs, non-prescription drugs, or dietary supplements you use. Also tell them if you smoke, drink alcohol, or use illegal drugs. Some items may interact with your medicine. What should I watch  for while using this medicine? Visit your doctor or healthcare professional regularly. Tell your doctor or healthcare professional if your symptoms do not start to get better or if they get worse. You may need blood work done while you are taking this medicine. You may need to follow a special diet. Talk to your doctor. Foods that contain iron include: whole grains/cereals, dried fruits, beans, or peas, leafy green vegetables, and organ meats (liver, kidney). What side effects may I notice from receiving this medicine? Side effects that you should report to your doctor or health care professional as soon as possible: -allergic reactions like skin rash, itching or hives, swelling of the face, lips, or tongue -breathing problems -changes in blood pressure -feeling faint or lightheaded, falls -fever or chills -flushing, sweating, or hot feelings -swelling of the ankles or feet Side effects that usually do not require medical attention (Report these to your doctor or health care professional if they continue or are bothersome.): -diarrhea -headache -nausea, vomiting -stomach pain This list may not describe all possible side effects. Call your doctor for medical advice about side effects. You may report side effects to FDA at 1-800-FDA-1088. Where should I keep my medicine? This drug is given in a hospital or clinic and will not be stored at home. NOTE: This sheet is a summary. It may not cover all possible information. If you have questions about this medicine, talk to your doctor, pharmacist, or health care provider.  2012, [employer]vier/Gold Standard. (11/14/2007 9:48:25 PM) 

## 2011-08-25 ENCOUNTER — Ambulatory Visit: Payer: Medicaid Other

## 2011-08-26 ENCOUNTER — Ambulatory Visit (INDEPENDENT_AMBULATORY_CARE_PROVIDER_SITE_OTHER): Payer: Medicaid Other | Admitting: Surgery

## 2011-08-26 ENCOUNTER — Encounter (INDEPENDENT_AMBULATORY_CARE_PROVIDER_SITE_OTHER): Payer: Self-pay | Admitting: Surgery

## 2011-08-26 VITALS — BP 116/84 | HR 90 | Temp 97.4°F | Ht 65.0 in | Wt 161.8 lb

## 2011-08-26 DIAGNOSIS — C50419 Malignant neoplasm of upper-outer quadrant of unspecified female breast: Secondary | ICD-10-CM

## 2011-08-26 NOTE — Progress Notes (Signed)
The patient comes for another wound check.  She is feeling quite well today.  She is scheduled for another cycle of chemo next week.  The wound is free of any necrotic tissue and is completely granulated.  It only measures about 5 cm across.  She can switch back to saline for her dressing changes.  Continue dressing changes with NS daily.  Follow-up 3-4 weeks.  Wilmon Arms. Corliss Skains, MD, [facility] Surgery  08/26/2011 2:24 PM

## 2011-08-27 ENCOUNTER — Ambulatory Visit: Payer: Medicaid Other

## 2011-08-29 ENCOUNTER — Ambulatory Visit: Payer: Medicaid Other

## 2011-08-29 ENCOUNTER — Other Ambulatory Visit: Payer: Self-pay | Admitting: *Deleted

## 2011-08-31 ENCOUNTER — Ambulatory Visit (HOSPITAL_BASED_OUTPATIENT_CLINIC_OR_DEPARTMENT_OTHER): Payer: Medicaid Other

## 2011-08-31 ENCOUNTER — Other Ambulatory Visit: Payer: Self-pay | Admitting: *Deleted

## 2011-08-31 ENCOUNTER — Other Ambulatory Visit (HOSPITAL_BASED_OUTPATIENT_CLINIC_OR_DEPARTMENT_OTHER): Payer: Medicaid Other | Admitting: Lab

## 2011-08-31 ENCOUNTER — Ambulatory Visit (HOSPITAL_BASED_OUTPATIENT_CLINIC_OR_DEPARTMENT_OTHER): Payer: Medicaid Other | Admitting: Oncology

## 2011-08-31 VITALS — BP 104/67 | HR 89 | Temp 97.9°F | Ht 65.0 in | Wt 163.4 lb

## 2011-08-31 DIAGNOSIS — C787 Secondary malignant neoplasm of liver and intrahepatic bile duct: Secondary | ICD-10-CM

## 2011-08-31 DIAGNOSIS — C801 Malignant (primary) neoplasm, unspecified: Secondary | ICD-10-CM

## 2011-08-31 DIAGNOSIS — D649 Anemia, unspecified: Secondary | ICD-10-CM

## 2011-08-31 DIAGNOSIS — C50419 Malignant neoplasm of upper-outer quadrant of unspecified female breast: Secondary | ICD-10-CM

## 2011-08-31 DIAGNOSIS — Z5111 Encounter for antineoplastic chemotherapy: Secondary | ICD-10-CM

## 2011-08-31 DIAGNOSIS — C773 Secondary and unspecified malignant neoplasm of axilla and upper limb lymph nodes: Secondary | ICD-10-CM

## 2011-08-31 DIAGNOSIS — N6320 Unspecified lump in the left breast, unspecified quadrant: Secondary | ICD-10-CM

## 2011-08-31 LAB — CBC WITH DIFFERENTIAL/PLATELET
Basophils Absolute: 0 10*3/uL (ref 0.0–0.1)
EOS%: 0 % (ref 0.0–7.0)
Eosinophils Absolute: 0 10*3/uL (ref 0.0–0.5)
HCT: 32.6 % — ABNORMAL LOW (ref 34.8–46.6)
HGB: 10.5 g/dL — ABNORMAL LOW (ref 11.6–15.9)
MCH: 23.9 pg — ABNORMAL LOW (ref 25.1–34.0)
MCV: 74.3 fL — ABNORMAL LOW (ref 79.5–101.0)
MONO%: 2.2 % (ref 0.0–14.0)
NEUT#: 7.8 10*3/uL — ABNORMAL HIGH (ref 1.5–6.5)
NEUT%: 91.8 % — ABNORMAL HIGH (ref 38.4–76.8)
RDW: 28.1 % — ABNORMAL HIGH (ref 11.2–14.5)
lymph#: 0.5 10*3/uL — ABNORMAL LOW (ref 0.9–3.3)

## 2011-08-31 LAB — COMPREHENSIVE METABOLIC PANEL
AST: 12 U/L (ref 0–37)
Alkaline Phosphatase: 47 U/L (ref 39–117)
BUN: 11 mg/dL (ref 6–23)
Creatinine, Ser: 0.53 mg/dL (ref 0.50–1.10)
Glucose, Bld: 131 mg/dL — ABNORMAL HIGH (ref 70–99)

## 2011-08-31 LAB — FERRITIN: Ferritin: 243 ng/mL (ref 10–291)

## 2011-08-31 MED ORDER — PALONOSETRON HCL INJECTION 0.25 MG/5ML
0.2500 mg | Freq: Once | INTRAVENOUS | Status: AC
  Administered 2011-08-31: 0.25 mg via INTRAVENOUS

## 2011-08-31 MED ORDER — SODIUM CHLORIDE 0.9 % IJ SOLN
10.0000 mL | INTRAMUSCULAR | Status: DC | PRN
  Administered 2011-08-31: 10 mL
  Filled 2011-08-31: qty 10

## 2011-08-31 MED ORDER — DOXORUBICIN HCL CHEMO IV INJECTION 2 MG/ML
50.0000 mg/m2 | Freq: Once | INTRAVENOUS | Status: AC
  Administered 2011-08-31: 94 mg via INTRAVENOUS
  Filled 2011-08-31: qty 47

## 2011-08-31 MED ORDER — DEXTROSE 5 % IV SOLN
75.0000 mg/m2 | Freq: Once | INTRAVENOUS | Status: AC
  Administered 2011-08-31: 140 mg via INTRAVENOUS
  Filled 2011-08-31: qty 14

## 2011-08-31 MED ORDER — DEXAMETHASONE SODIUM PHOSPHATE 4 MG/ML IJ SOLN
12.0000 mg | Freq: Once | INTRAMUSCULAR | Status: AC
  Administered 2011-08-31: 12 mg via INTRAVENOUS

## 2011-08-31 MED ORDER — HEPARIN SOD (PORK) LOCK FLUSH 100 UNIT/ML IV SOLN
500.0000 [IU] | Freq: Once | INTRAVENOUS | Status: AC | PRN
  Administered 2011-08-31: 500 [IU]
  Filled 2011-08-31: qty 5

## 2011-08-31 MED ORDER — SODIUM CHLORIDE 0.9 % IV SOLN
500.0000 mg/m2 | Freq: Once | INTRAVENOUS | Status: AC
  Administered 2011-08-31: 940 mg via INTRAVENOUS
  Filled 2011-08-31: qty 47

## 2011-08-31 MED ORDER — SODIUM CHLORIDE 0.9 % IV SOLN
Freq: Once | INTRAVENOUS | Status: AC
  Administered 2011-08-31: 13:00:00 via INTRAVENOUS

## 2011-08-31 MED ORDER — SODIUM CHLORIDE 0.9 % IV SOLN
150.0000 mg | Freq: Once | INTRAVENOUS | Status: AC
  Administered 2011-08-31: 150 mg via INTRAVENOUS
  Filled 2011-08-31: qty 5

## 2011-08-31 NOTE — Patient Instructions (Addendum)
[facility] Discharge Instructions for Patients Receiving Chemotherapy  Today you received the following chemotherapy agents Adriamycin, Taxotere and Cytoxan.  To help prevent nausea and vomiting after your treatment, we encourage you to take your nausea medication.   If you develop nausea and vomiting that is not controlled by your nausea medication, call the clinic. If it is after clinic hours your family physician or the after hours number for the clinic or go to the Emergency Department.   BELOW ARE SYMPTOMS THAT SHOULD BE REPORTED IMMEDIATELY:  *FEVER GREATER THAN 100.5 F  *CHILLS WITH OR WITHOUT FEVER  NAUSEA AND VOMITING THAT IS NOT CONTROLLED WITH YOUR NAUSEA MEDICATION  *UNUSUAL SHORTNESS OF BREATH  *UNUSUAL BRUISING OR BLEEDING  TENDERNESS IN MOUTH AND THROAT WITH OR WITHOUT PRESENCE OF ULCERS  *URINARY PROBLEMS  *BOWEL PROBLEMS  UNUSUAL RASH Items with * indicate a potential emergency and should be followed up as soon as possible.  One of the nurses will contact you 24 hours after your treatment. Please let the nurse know about any problems that you may have experienced. Feel free to call the clinic you have any questions or concerns. The clinic phone number is [phone].   I have been informed and understand all the instructions given to me. I know to contact the clinic, my physician, or go to the Emergency Department if any problems should occur. I do not have any questions at this time, but understand that I may call the clinic during office hours   should I have any questions or need assistance in obtaining follow up care.    __________________________________________  _____________  __________ Signature of Patient or Authorized Representative            Date                   Time    __________________________________________ Nurse's Signature
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
## 2011-09-01 ENCOUNTER — Ambulatory Visit (HOSPITAL_BASED_OUTPATIENT_CLINIC_OR_DEPARTMENT_OTHER): Payer: Medicaid Other

## 2011-09-01 VITALS — BP 108/66 | HR 67 | Temp 98.2°F

## 2011-09-01 DIAGNOSIS — C50419 Malignant neoplasm of upper-outer quadrant of unspecified female breast: Secondary | ICD-10-CM

## 2011-09-01 DIAGNOSIS — N6320 Unspecified lump in the left breast, unspecified quadrant: Secondary | ICD-10-CM

## 2011-09-01 DIAGNOSIS — C801 Malignant (primary) neoplasm, unspecified: Secondary | ICD-10-CM

## 2011-09-01 MED ORDER — PEGFILGRASTIM INJECTION 6 MG/0.6ML
6.0000 mg | Freq: Once | SUBCUTANEOUS | Status: AC
  Administered 2011-09-01: 6 mg via SUBCUTANEOUS
  Filled 2011-09-01: qty 0.6

## 2011-09-03 ENCOUNTER — Ambulatory Visit: Payer: Medicaid Other

## 2011-09-05 ENCOUNTER — Telehealth: Payer: Self-pay | Admitting: Oncology

## 2011-09-05 ENCOUNTER — Other Ambulatory Visit: Payer: Self-pay | Admitting: Oncology

## 2011-09-05 ENCOUNTER — Telehealth: Payer: Self-pay | Admitting: *Deleted

## 2011-09-05 DIAGNOSIS — C50919 Malignant neoplasm of unspecified site of unspecified female breast: Secondary | ICD-10-CM

## 2011-09-05 NOTE — Telephone Encounter (Signed)
S/w the pt and she is aware to pick up a revised July,aug and sept calendar when she comes in the office on 09/07/2011

## 2011-09-05 NOTE — Telephone Encounter (Signed)
Per staff message I have scheduled appts. JMW  

## 2011-09-07 ENCOUNTER — Telehealth: Payer: Self-pay | Admitting: *Deleted

## 2011-09-07 ENCOUNTER — Ambulatory Visit (HOSPITAL_BASED_OUTPATIENT_CLINIC_OR_DEPARTMENT_OTHER): Payer: Medicaid Other | Admitting: Physician Assistant

## 2011-09-07 ENCOUNTER — Encounter: Payer: Self-pay | Admitting: Physician Assistant

## 2011-09-07 ENCOUNTER — Other Ambulatory Visit (HOSPITAL_BASED_OUTPATIENT_CLINIC_OR_DEPARTMENT_OTHER): Payer: Medicaid Other

## 2011-09-07 VITALS — BP 102/65 | HR 96 | Temp 98.7°F | Ht 65.0 in | Wt 165.1 lb

## 2011-09-07 DIAGNOSIS — D649 Anemia, unspecified: Secondary | ICD-10-CM

## 2011-09-07 DIAGNOSIS — Z171 Estrogen receptor negative status [ER-]: Secondary | ICD-10-CM

## 2011-09-07 DIAGNOSIS — C50419 Malignant neoplasm of upper-outer quadrant of unspecified female breast: Secondary | ICD-10-CM

## 2011-09-07 DIAGNOSIS — C773 Secondary and unspecified malignant neoplasm of axilla and upper limb lymph nodes: Secondary | ICD-10-CM

## 2011-09-07 LAB — CBC WITH DIFFERENTIAL/PLATELET
BASO%: 0.9 % (ref 0.0–2.0)
Basophils Absolute: 0 10*3/uL (ref 0.0–0.1)
HCT: 31.4 % — ABNORMAL LOW (ref 34.8–46.6)
HGB: 10 g/dL — ABNORMAL LOW (ref 11.6–15.9)
MCHC: 31.8 g/dL (ref 31.5–36.0)
MONO#: 0.1 10*3/uL (ref 0.1–0.9)
NEUT%: 48.2 % (ref 38.4–76.8)
RDW: 28.4 % — ABNORMAL HIGH (ref 11.2–14.5)
WBC: 1.1 10*3/uL — ABNORMAL LOW (ref 3.9–10.3)
lymph#: 0.4 10*3/uL — ABNORMAL LOW (ref 0.9–3.3)

## 2011-09-07 MED ORDER — CIPROFLOXACIN HCL 500 MG PO TABS: 500.0000 mg | ORAL_TABLET | Freq: Two times a day (BID) | ORAL | Status: AC

## 2011-09-07 NOTE — Telephone Encounter (Signed)
Gave patient appointment for 09-22-2011 and 10-13-2011 injections printed out calendars and gave to the patient

## 2011-09-07 NOTE — Progress Notes (Signed)
ID: Alyssa May   DOB: 15-Dec-1965  MR#: [id number]  WUJ#:[id number]  HISTORY OF PRESENT ILLNESS: Alyssa May is a 46 year-old [place] woman who first noted a small mass in her Left breast November 2011, shortly after she stopped breast-feeding. It did not disappear with resumption of her periods and grew over the next year, and particularly since she had her miscarrriage October 2012. This brought her to the Emergency Room 03/09/2011 where she was found to have a fluctuant area in the UOQ of the Left breast measuring 8.4 cm. The tissue beneath the cystic area extending towards the axilla and medially was described as firm, but not erythematous. The cystic area was lanced and a large amount of serosanguinos material issued.  The patient was referred to Dr. Marcille Blanco who saw her 03/11/2011. By this time the fluid had largely reaccumulated.[name] He incised the lesion, again draining serosanguinous liquid, packed it, and set the patient up [facility]14[facility]. This showed an irregularly marginated mass measuring 7.3 cm, with abnormal axillary adenopathy. On 01/1102013 bilateral mammography confirmed a high-density mass in the Left UOQ. This was biopsied, as was a Left axillary lymph node. [facility] biopsies ([id number]) showed invasive ductal carcinoma, high grade, triple negative, with an Mib-1 of 97%. Subsequent treatments are as detailed below  INTERVAL HISTORY: Alyssa May returns today with her husband Alyssa May for followup of her locally advanced left breast carcinoma. Today is day 8 cycle 3 of 6 planned cycles of TAC chemotherapy.  Interval history is unremarkable with the exception of a trip out of town last week that made [name] feel "exhausted". Otherwise, she feels that she tolerated cycle 3 well.  REVIEW OF SYSTEMS: [name] denies any fevers, chills, or night sweats. Yesterday was her typhus today since chemotherapy last week. She felt increasingly tired, weak, and slightly dizzy.  She admits that she was not keeping herself well hydrated and is now drinking more water. Fortunately, she's had no nausea. She denies any change in bowel habits. No cough or increased shortness of breath. No chest pain. No abnormal headaches or change in vision. No unusual pain or peripheral swelling. No signs at all of numbness or tingling in either the upper or lower extremities. She has some mild hyperpigmentation in the skin. She also has a burn on her right hand which is somewhat painful.  Otherwise a detailed review of systems is stable noncontributory.  PAST MEDICAL HISTORY: Past Medical History  Diagnosis Date  . Breast abscess   . No pertinent past medical history   . Cancer     lt br     PAST SURGICAL HISTORY: Past Surgical History  Procedure Date  . Dilation and curettage of uterus 01/04/11  . Breast mass excision 2013  . Tonsillectomy   . Portacath placement 04/12/2011    Procedure: INSERTION PORT-A-CATH;  Surgeon: Wilmon Arms. Tsuei, [place];  Location: [facility];  Service: General;  Laterality: Right;  right subclavian  . Incision and drainage breast abscess 03/31/11    FAMILY HISTORY Family History  Problem Relation Age of Onset  . Diabetes Mother   . Hypertension Mother   the patient's father died from post-operative complications at age 25; the patient's mother is 4, lives in [place] The patient is a single child  GYNECOLOGIC HISTORY: first pregnancy to term age 33. 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 Her husband [name]  Rote is a Hospital doctor for a Acupuncturist in [place]. Their daughter, Alyssa May, is 3. The family stays part-time with the patient's mother in [place]    ADVANCED DIRECTIVES:  HEALTH MAINTENANCE: History  Substance Use Topics  . Smoking status: Never Smoker   . Smokeless tobacco: Never Used  . Alcohol Use: Yes     occasional     Colonoscopy:  PAP:  Bone  density:  Lipid panel:  Allergies  Allergen Reactions  . Sulfa Antibiotics Swelling    Current Outpatient Prescriptions  Medication Sig Dispense Refill  . ciprofloxacin (CIPRO) 500 MG tablet Take 1 tablet (500 mg total) by mouth 2 (two) times daily.  14 tablet  3  . lidocaine-prilocaine (EMLA) cream Apply 1 application topically as needed. To port      . loratadine (CLARITIN) 10 MG tablet Take 10 mg by mouth daily.      [name] LORazepam (ATIVAN) 0.5 MG tablet Take 0.5 mg by mouth at bedtime as needed.      [name] morphine (MS CONTIN) 15 MG 12 hr tablet Take 1 tablet (15 mg total) by mouth 2 (two) times daily.  60 tablet  0  . naproxen sodium (ALEVE) 220 MG tablet Take 1 tablet (220 mg total) by mouth 3 (three) times daily with meals as needed (for fever or pain).  60 tablet  0  . ondansetron (ZOFRAN) 8 MG tablet Take by mouth every 8 (eight) hours as needed.      [name] oxyCODONE-acetaminophen (PERCOCET) 5-325 MG per tablet       . prochlorperazine (COMPAZINE) 10 MG tablet Take 10 mg by mouth every 6 (six) hours as needed. For nausea        OBJECTIVE:  Middle-aged [place] American woman in no acute distress Filed Vitals:   09/07/11 1344  BP: 102/65  Pulse: 96  Temp: 98.7 F (37.1 C)     Body mass index is 27.47 kg/(m^2).    ECOG FS: 1  Filed Weights   09/07/11 1344  Weight: 165 lb 1.6 oz (74.889 kg)   Physical Exam: HEENT:  Sclerae anicteric, conjunctivae pink.  Oropharynx clear.  No mucositis or candidiasis.   Nodes:  No cervical or supraclavicular lymphadenopathy palpated. Breast Exam:  Complete breast exam was deferred. With general observation, however, the left breast is no longer significantly larger than the right breast. There is a clean dry dressing covering the lateral portion of the left breast.   Lungs:  Clear to auscultation bilaterally.  No crackles, rhonchi, or wheezes.   Heart:  Regular rate and rhythm.   Abdomen:  Soft, nontender.  Positive bowel sounds.  No organomegaly or  masses palpated.   Musculoskeletal:  No focal spinal tenderness to palpation.  Extremities:  Benign.  No peripheral edema or cyanosis.   Skin:  Benign.   Neuro:  Nonfocal. Alert and oriented x3.   LAB RESULTS: Lab Results  Component Value Date   WBC 1.1* 09/07/2011   NEUTROABS 0.5* 09/07/2011   HGB 10.0* 09/07/2011   HCT 31.4* 09/07/2011   MCV 76.0* 09/07/2011   PLT 206 09/07/2011      Chemistry      Component Value Date/Time   NA 140 08/31/2011 1146   K 3.6 08/31/2011 1146   CL 105 08/31/2011 1146   CO2 23 08/31/2011 1146   BUN 11 08/31/2011 1146   CREATININE 0.53 08/31/2011 1146      Component Value Date/Time   CALCIUM 9.1 08/31/2011 1146   ALKPHOS 47  08/31/2011 1146   AST 12 08/31/2011 1146   ALT 7 08/31/2011 1146   BILITOT 0.2* 08/31/2011 1146       Lab Results  Component Value Date   LABCA2 13 07/29/2011    STUDIES: No results found.  ASSESSMENT: 46 y.o.  [place] woman with stage IV breast cancer,   (1)  s/p Left breast biopsy 03/18/2011 showing a clinical T4,N1 invasive ductal carcinoma, high grade, triple negative, with an Mib-1 of 97%, with a single liver metastasis pathologically confirmed 03/31/2011  (2) receiving neoadjuvant chemotherapy, the goal being to complete 6 q. three-week doses of docetaxel/cyclophosphamide/doxorubicin, with Neulasta on day 2 for granulocyte support.  (3)  status post hospitalization for febrile neutropenia following cycle 1.  (4) Symptomatic anemia, s/p feraheme 08/15/2011  PLAN: Labria continues to tolerate chemotherapy remarkably well.  She is scheduled for a brief followup with Dr. Darnelle Catalan next week, but might be out of town. If so, she will simply call to cancel that appointment, and return on July 17 prior to her fourth scheduled cycle of chemotherapy. Per Dr. Darnelle Catalan, he is not planning on obtaining a PET scan after the third cycle, but will obtain an MRI of the liver after cycle 6 to decide what to do regarding local treatment.  We  again reviewed neutropenic precautions, and Corda is being started back on Cipro prophylactically, 500 mg by mouth twice a day for 7 days. At this time I see no evidence of infection in the burn on the right hand, but she will let us know if that worsens.  she knows to call with any additional changes or problems.    Zhaniya Swallows    09/07/2011

## 2011-09-10 ENCOUNTER — Ambulatory Visit: Payer: Medicaid Other

## 2011-09-13 ENCOUNTER — Other Ambulatory Visit: Payer: Medicaid Other | Admitting: Lab

## 2011-09-13 ENCOUNTER — Ambulatory Visit: Payer: Medicaid Other | Admitting: Oncology

## 2011-09-14 ENCOUNTER — Ambulatory Visit: Payer: Medicaid Other

## 2011-09-15 ENCOUNTER — Ambulatory Visit: Payer: Medicaid Other

## 2011-09-20 ENCOUNTER — Encounter (INDEPENDENT_AMBULATORY_CARE_PROVIDER_SITE_OTHER): Payer: Medicaid Other | Admitting: Surgery

## 2011-09-21 ENCOUNTER — Ambulatory Visit: Payer: Medicaid Other | Admitting: Physician Assistant

## 2011-09-21 ENCOUNTER — Other Ambulatory Visit: Payer: Medicaid Other | Admitting: Lab

## 2011-09-21 ENCOUNTER — Ambulatory Visit: Payer: Medicaid Other

## 2011-09-21 ENCOUNTER — Other Ambulatory Visit: Payer: Self-pay | Admitting: Oncology

## 2011-09-22 ENCOUNTER — Other Ambulatory Visit: Payer: Self-pay | Admitting: Certified Registered Nurse Anesthetist

## 2011-09-22 ENCOUNTER — Ambulatory Visit: Payer: Medicaid Other

## 2011-09-22 ENCOUNTER — Ambulatory Visit (HOSPITAL_BASED_OUTPATIENT_CLINIC_OR_DEPARTMENT_OTHER): Payer: Medicaid Other

## 2011-09-22 ENCOUNTER — Other Ambulatory Visit (HOSPITAL_BASED_OUTPATIENT_CLINIC_OR_DEPARTMENT_OTHER): Payer: Medicaid Other | Admitting: Lab

## 2011-09-22 VITALS — BP 105/65 | HR 84 | Temp 97.1°F

## 2011-09-22 DIAGNOSIS — Z5111 Encounter for antineoplastic chemotherapy: Secondary | ICD-10-CM

## 2011-09-22 DIAGNOSIS — N6320 Unspecified lump in the left breast, unspecified quadrant: Secondary | ICD-10-CM

## 2011-09-22 DIAGNOSIS — C50419 Malignant neoplasm of upper-outer quadrant of unspecified female breast: Secondary | ICD-10-CM

## 2011-09-22 DIAGNOSIS — C801 Malignant (primary) neoplasm, unspecified: Secondary | ICD-10-CM

## 2011-09-22 LAB — COMPREHENSIVE METABOLIC PANEL
Albumin: 3.8 g/dL (ref 3.5–5.2)
BUN: 12 mg/dL (ref 6–23)
CO2: 30 mEq/L (ref 19–32)
Calcium: 8.9 mg/dL (ref 8.4–10.5)
Chloride: 104 mEq/L (ref 96–112)
Glucose, Bld: 105 mg/dL — ABNORMAL HIGH (ref 70–99)
Potassium: 4.3 mEq/L (ref 3.5–5.3)
Sodium: 139 mEq/L (ref 135–145)
Total Protein: 6.2 g/dL (ref 6.0–8.3)

## 2011-09-22 LAB — CBC WITH DIFFERENTIAL/PLATELET
Basophils Absolute: 0 10*3/uL (ref 0.0–0.1)
Eosinophils Absolute: 0 10*3/uL (ref 0.0–0.5)
HGB: 10.8 g/dL — ABNORMAL LOW (ref 11.6–15.9)
MCV: 79.1 fL — ABNORMAL LOW (ref 79.5–101.0)
MONO#: 0.5 10*3/uL (ref 0.1–0.9)
MONO%: 11 % (ref 0.0–14.0)
NEUT#: 2.8 10*3/uL (ref 1.5–6.5)
RBC: 4.12 10*6/uL (ref 3.70–5.45)
RDW: 28.4 % — ABNORMAL HIGH (ref 11.2–14.5)
WBC: 4.4 10*3/uL (ref 3.9–10.3)

## 2011-09-22 MED ORDER — SODIUM CHLORIDE 0.9 % IV SOLN
150.0000 mg | Freq: Once | INTRAVENOUS | Status: AC
  Administered 2011-09-22: 150 mg via INTRAVENOUS
  Filled 2011-09-22: qty 5

## 2011-09-22 MED ORDER — SODIUM CHLORIDE 0.9 % IV SOLN
500.0000 mg/m2 | Freq: Once | INTRAVENOUS | Status: AC
  Administered 2011-09-22: 940 mg via INTRAVENOUS
  Filled 2011-09-22: qty 47

## 2011-09-22 MED ORDER — DOCETAXEL CHEMO INJECTION 160 MG/16ML
75.0000 mg/m2 | Freq: Once | INTRAVENOUS | Status: AC
  Administered 2011-09-22: 140 mg via INTRAVENOUS
  Filled 2011-09-22: qty 14

## 2011-09-22 MED ORDER — SODIUM CHLORIDE 0.9 % IJ SOLN
10.0000 mL | INTRAMUSCULAR | Status: DC | PRN
  Administered 2011-09-22: 10 mL
  Filled 2011-09-22: qty 10

## 2011-09-22 MED ORDER — DOXORUBICIN HCL CHEMO IV INJECTION 2 MG/ML
50.0000 mg/m2 | Freq: Once | INTRAVENOUS | Status: AC
  Administered 2011-09-22: 94 mg via INTRAVENOUS
  Filled 2011-09-22: qty 47

## 2011-09-22 MED ORDER — HEPARIN SOD (PORK) LOCK FLUSH 100 UNIT/ML IV SOLN
500.0000 [IU] | Freq: Once | INTRAVENOUS | Status: AC | PRN
  Administered 2011-09-22: 500 [IU]
  Filled 2011-09-22: qty 5

## 2011-09-22 MED ORDER — PALONOSETRON HCL INJECTION 0.25 MG/5ML
0.2500 mg | Freq: Once | INTRAVENOUS | Status: AC
  Administered 2011-09-22: 0.25 mg via INTRAVENOUS

## 2011-09-22 MED ORDER — DEXAMETHASONE SODIUM PHOSPHATE 4 MG/ML IJ SOLN
12.0000 mg | Freq: Once | INTRAMUSCULAR | Status: AC
  Administered 2011-09-22: 12 mg via INTRAVENOUS

## 2011-09-22 MED ORDER — SODIUM CHLORIDE 0.9 % IV SOLN
Freq: Once | INTRAVENOUS | Status: AC
  Administered 2011-09-22: 11:00:00 via INTRAVENOUS

## 2011-09-22 NOTE — Patient Instructions (Addendum)
[id number] [facility] Discharge Instructions for Patients Receiving Chemotherapy  Today you received the following chemotherapy agents Taxotere, Cytoxan and Doxorubicin.  To help prevent nausea and vomiting after your treatment, we encourage you to take your nausea medication. Begin taking it as often as prescribed for by your physician.    If you develop nausea and vomiting that is not controlled by your nausea medication, call the clinic. If it is after clinic hours your family physician or the after hours number for the clinic or go to the Emergency Department.   BELOW ARE SYMPTOMS THAT SHOULD BE REPORTED IMMEDIATELY:  *FEVER GREATER THAN 100.5 F  *CHILLS WITH OR WITHOUT FEVER  NAUSEA AND VOMITING THAT IS NOT CONTROLLED WITH YOUR NAUSEA MEDICATION  *UNUSUAL SHORTNESS OF BREATH  *UNUSUAL BRUISING OR BLEEDING  TENDERNESS IN MOUTH AND THROAT WITH OR WITHOUT PRESENCE OF ULCERS  *URINARY PROBLEMS  *BOWEL PROBLEMS  UNUSUAL RASH Items with * indicate a potential emergency and should be followed up as soon as possible.  One of the nurses will contact you 24 hours after your treatment. Please let the nurse know about any problems that you may have experienced. Feel free to call the clinic you have any questions or concerns. The clinic phone number is [phone].   I have been informed and understand all the instructions given to me. I know to contact the clinic, my physician, or go to the Emergency Department if any problems should occur. I do not have any questions at this time, but understand that I may call the clinic during office hours   should I have any questions or need assistance in obtaining follow up care.    __________________________________________  _____________  __________ Signature of Patient or Authorized Representative            Date                   Time    __________________________________________ Nurse's Signature

## 2011-09-23 ENCOUNTER — Other Ambulatory Visit: Payer: Self-pay | Admitting: *Deleted

## 2011-09-23 ENCOUNTER — Ambulatory Visit (HOSPITAL_BASED_OUTPATIENT_CLINIC_OR_DEPARTMENT_OTHER): Payer: Medicaid Other

## 2011-09-23 VITALS — BP 112/63 | HR 70 | Temp 97.6°F

## 2011-09-23 DIAGNOSIS — N6320 Unspecified lump in the left breast, unspecified quadrant: Secondary | ICD-10-CM

## 2011-09-23 DIAGNOSIS — C801 Malignant (primary) neoplasm, unspecified: Secondary | ICD-10-CM

## 2011-09-23 DIAGNOSIS — C50419 Malignant neoplasm of upper-outer quadrant of unspecified female breast: Secondary | ICD-10-CM

## 2011-09-23 MED ORDER — PEGFILGRASTIM INJECTION 6 MG/0.6ML
6.0000 mg | Freq: Once | SUBCUTANEOUS | Status: AC
  Administered 2011-09-23: 6 mg via SUBCUTANEOUS
  Filled 2011-09-23: qty 0.6

## 2011-09-23 MED ORDER — CIPROFLOXACIN HCL 500 MG PO TABS: 500.0000 mg | ORAL_TABLET | Freq: Two times a day (BID) | ORAL | Status: AC

## 2011-09-28 ENCOUNTER — Other Ambulatory Visit (HOSPITAL_BASED_OUTPATIENT_CLINIC_OR_DEPARTMENT_OTHER): Payer: Medicaid Other | Admitting: Lab

## 2011-09-28 ENCOUNTER — Ambulatory Visit (HOSPITAL_BASED_OUTPATIENT_CLINIC_OR_DEPARTMENT_OTHER): Payer: Medicaid Other | Admitting: Physician Assistant

## 2011-09-28 ENCOUNTER — Other Ambulatory Visit: Payer: Self-pay | Admitting: *Deleted

## 2011-09-28 ENCOUNTER — Encounter: Payer: Self-pay | Admitting: Physician Assistant

## 2011-09-28 VITALS — BP 99/65 | HR 80 | Temp 97.8°F | Ht 65.0 in | Wt 166.5 lb

## 2011-09-28 DIAGNOSIS — C50419 Malignant neoplasm of upper-outer quadrant of unspecified female breast: Secondary | ICD-10-CM

## 2011-09-28 LAB — CBC WITH DIFFERENTIAL/PLATELET
BASO%: 0 % (ref 0.0–2.0)
EOS%: 0.8 % (ref 0.0–7.0)
HGB: 10.6 g/dL — ABNORMAL LOW (ref 11.6–15.9)
MCH: 26.8 pg (ref 25.1–34.0)
MCHC: 33.3 g/dL (ref 31.5–36.0)
RDW: 26.7 % — ABNORMAL HIGH (ref 11.2–14.5)
lymph#: 0.4 10*3/uL — ABNORMAL LOW (ref 0.9–3.3)
nRBC: 0 % (ref 0–0)

## 2011-09-28 NOTE — Progress Notes (Signed)
ID: [name]   DOB: 1965-08-25  MR#: [id number]  WUJ#:[id number]  HISTORY OF PRESENT ILLNESS: Alyssa May is a 46 year-old [place] woman who first noted a small mass in her Left breast November 2011, shortly after she stopped breast-feeding. It did not disappear with resumption of her periods and grew over the next year, and particularly since she had her miscarrriage October 2012. This brought her to the Emergency Room 03/09/2011 where she was found to have a fluctuant area in the UOQ of the Left breast measuring 8.4 cm. The tissue beneath the cystic area extending towards the axilla and medially was described as firm, but not erythematous. The cystic area was lanced and a large amount of serosanguinos material issued.  The patient was referred to Dr. Marcille May who saw her 03/11/2011. By this time the fluid had largely reaccumulated.[name] He incised the lesion, again draining serosanguinous liquid, packed it, and set the patient up [facility]14[facility]. This showed an irregularly marginated mass measuring 7.3 cm, with abnormal axillary adenopathy. On 01/1102013 bilateral mammography confirmed a high-density mass in the Left UOQ. This was biopsied, as was a Left axillary lymph node. [facility] biopsies ([id number]) showed invasive ductal carcinoma, high grade, triple negative, with an Mib-1 of 97%. Subsequent treatments are as detailed below  INTERVAL HISTORY: Alyssa May returns today for followup of her locally advanced left breast carcinoma. Today is day 7 cycle 4 of 6 planned cycles of TAC chemotherapy.  Interval history is unremarkable with the exception of [place]  developing a "cold" last week with a running nose. This has improved, and appears to be resolving. She's had no fevers or chills.  REVIEW OF SYSTEMS: [place]a [place]  continues to be mildly fatigued.  She's had no skin changes, nailbed changes, and no signs of abnormal bleeding. Fortunately, she's had no nauseaand  denies any  change in bowel habits.  She has an occasional cough productive of light green-colored phlegm, with no hemoptysis. No increased shortness of breath. No chest pain. No abnormal headaches or change in vision. No unusual pain or peripheral swelling. No signs at all of numbness or tingling in either the upper or lower extremities.   Otherwise a detailed review of systems is stable and noncontributory.  PAST MEDICAL HISTORY: Past Medical History  Diagnosis Date  . Breast abscess   . No pertinent past medical history   . Cancer     lt br     PAST SURGICAL HISTORY: Past Surgical History  Procedure Date  . Dilation and curettage of uterus 01/04/11  . Breast mass excision 2013  . Tonsillectomy   . Portacath placement 04/12/2011    Procedure: INSERTION PORT-A-CATH;  Surgeon: [place], [place] May;  Location: [facility];  Service: General;  Laterality: Right;  right subclavian  . Incision and drainage breast abscess 03/31/11    FAMILY HISTORY Family History  Problem Relation Age of Onset  . Diabetes Mother   . Hypertension Mother   the patient's father died from post-operative complications at age 43; the patient's mother is 21, lives in [place] The patient is a single child  GYNECOLOGIC HISTORY: first pregnancy to term age 29. 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 Her husband Alyssa May is a Hospital doctor for a Acupuncturist in [place]. Their daughter, Alyssa May, is 3. The family stays part-time with the patient's mother in [place]  ADVANCED DIRECTIVES:  HEALTH MAINTENANCE: History  Substance Use Topics  . Smoking status: Never Smoker   . Smokeless tobacco: Never Used  . Alcohol Use: Yes     occasional     Colonoscopy:  PAP:  Bone density:  Lipid panel:  Allergies  Allergen Reactions  . Sulfa Antibiotics Swelling    Current Outpatient Prescriptions  Medication Sig Dispense Refill  .  ciprofloxacin (CIPRO) 500 MG tablet Take 1 tablet (500 mg total) by mouth 2 (two) times daily.  14 tablet  0  . lidocaine-prilocaine (EMLA) cream Apply 1 application topically as needed. To port      . loratadine (CLARITIN) 10 MG tablet Take 10 mg by mouth daily.      [name] LORazepam (ATIVAN) 0.5 MG tablet Take 0.5 mg by mouth at bedtime as needed.      [name] morphine (MS CONTIN) 15 MG 12 hr tablet Take 1 tablet (15 mg total) by mouth 2 (two) times daily.  60 tablet  0  . naproxen sodium (ALEVE) 220 MG tablet Take 1 tablet (220 mg total) by mouth 3 (three) times daily with meals as needed (for fever or pain).  60 tablet  0  . ondansetron (ZOFRAN) 8 MG tablet Take by mouth every 8 (eight) hours as needed.      [name] oxyCODONE-acetaminophen (PERCOCET) 5-325 MG per tablet       . prochlorperazine (COMPAZINE) 10 MG tablet Take 10 mg by mouth every 6 (six) hours as needed. For nausea        OBJECTIVE:  Middle-aged [place] American woman in no acute distress Filed Vitals:   09/28/11 1151  BP: 99/65  Pulse: 80  Temp: 97.8 F (36.6 C)     Body mass index is 27.71 kg/(m^2).    ECOG FS: 1  Filed Weights   09/28/11 1151  Weight: 166 lb 8 oz (75.524 kg)   Physical Exam: HEENT:  Sclerae anicteric, conjunctivae pink.  Oropharynx benign. In No mucositis or candidiasis.   Nodes:  No cervical or supraclavicular lymphadenopathy palpated. Breast Exam:  Complete breast exam was deferred. With general observation, however, the left breast appears to be normal size. There is a clean dry dressing covering the lateral portion of the left breast.   Lungs:  Clear to auscultation bilaterally.  No crackles, rhonchi, or wheezes.   Heart:  Regular rate and rhythm.   Abdomen:  Soft, nontender.  Positive bowel sounds.  No organomegaly or masses palpated.   Musculoskeletal:  No focal spinal tenderness to palpation.  Extremities:  Benign.  No peripheral edema or cyanosis.   Skin:  Benign.   Neuro:  Nonfocal. Alert and oriented  x3.   LAB RESULTS: Lab Results  Component Value Date   WBC 1.3* 09/28/2011   NEUTROABS 0.8* 09/28/2011   HGB 10.6* 09/28/2011   HCT 31.8* 09/28/2011   MCV 80.5 09/28/2011   PLT 203 09/28/2011      Chemistry      Component Value Date/Time   NA 139 09/22/2011 1006   K 4.3 09/22/2011 1006   CL 104 09/22/2011 1006   CO2 30 09/22/2011 1006   BUN 12 09/22/2011 1006   CREATININE 0.64 09/22/2011 1006      Component Value Date/Time   CALCIUM 8.9 09/22/2011 1006   ALKPHOS 44 09/22/2011 1006   AST 17 09/22/2011 1006   ALT 12 09/22/2011 1006   BILITOT 0.2* 09/22/2011 1006       Lab Results  Component Value Date  LABCA2 13 07/29/2011    STUDIES: No results found.  ASSESSMENT: 46 y.o.  [place] woman with stage IV breast cancer,   (1)  s/p Left breast biopsy 03/18/2011 showing a clinical T4,N1 invasive ductal carcinoma, high grade, triple negative, with an Mib-1 of 97%, with a single liver metastasis pathologically confirmed 03/31/2011  (2) receiving neoadjuvant chemotherapy, the goal being to complete 6 q. three-week doses of docetaxel/cyclophosphamide/doxorubicin, with Neulasta on day 2 for granulocyte support.  (3)  status post hospitalization for febrile neutropenia following cycle 1.  (4) Symptomatic anemia, s/p feraheme 08/15/2011  PLAN: [place] continues to tolerate chemotherapy remarkably well and is very pleased with her results thus far. She scheduled to meet with Dr. Corliss Skains tomorrow for reassessment. I will see her again in 2 weeks, August 7, prior to her fifth scheduled dose of chemotherapy.  We again reviewed neutropenic precautions, and [place] is being started back on Cipro prophylactically, 500 mg by mouth twice a day for 7 days. She knows to call with any fevers 100 or above, and with any additional changes.   Alysabeth Scalia    09/28/2011

## 2011-09-29 ENCOUNTER — Encounter (INDEPENDENT_AMBULATORY_CARE_PROVIDER_SITE_OTHER): Payer: Self-pay | Admitting: Surgery

## 2011-09-29 ENCOUNTER — Ambulatory Visit (INDEPENDENT_AMBULATORY_CARE_PROVIDER_SITE_OTHER): Payer: Medicaid Other | Admitting: Surgery

## 2011-09-29 VITALS — BP 128/82 | HR 72 | Temp 98.4°F | Resp 18 | Ht 65.0 in | Wt 163.8 lb

## 2011-09-29 DIAGNOSIS — C50419 Malignant neoplasm of upper-outer quadrant of unspecified female breast: Secondary | ICD-10-CM

## 2011-09-29 NOTE — Progress Notes (Signed)
The patient comes in today for another wound check. She has completed cycle 4/6 of chemotherapy. She seems to be tolerating this well.  Her wound is well granulated and most of the wound is flush with the skin. Near the axilla there is still a depth of about 1.5 cm. New skin seems to be growing in from the sides. There is some drainage on the dressing but overall the wound seems to be healing well. Her left breast is back down to normal size and is much softer. Continue current dressing changes with daily wet-to-dry dressings with saline.  We spent some time discussing the need for future surgery. My feeling is that she will likely need a left modified radical mastectomy.    Since the patient already has liver metastases, I am not sure what benefit a lymph node dissection would add. I am not sure of the patient's need for adjuvant radiation therapy so it is unclear whether she would be a candidate for immediate reconstruction. The patient is currently hopeful that she will not need surgery but this is very unlikely. She will have further discussions with Dr. Darnelle Catalan at her next appointment.  Continue dressing changes. Followup 3 or 4 weeks.  [place]. Corliss Skains, MD, [facility]  09/29/2011 12:51 PM

## 2011-10-03 ENCOUNTER — Telehealth: Payer: Self-pay | Admitting: *Deleted

## 2011-10-05 ENCOUNTER — Other Ambulatory Visit: Payer: Self-pay | Admitting: Oncology

## 2011-10-05 ENCOUNTER — Ambulatory Visit: Payer: Medicaid Other

## 2011-10-06 ENCOUNTER — Ambulatory Visit: Payer: Medicaid Other

## 2011-10-12 ENCOUNTER — Ambulatory Visit: Payer: Medicaid Other

## 2011-10-12 ENCOUNTER — Other Ambulatory Visit (HOSPITAL_BASED_OUTPATIENT_CLINIC_OR_DEPARTMENT_OTHER): Payer: Medicaid Other | Admitting: Lab

## 2011-10-12 ENCOUNTER — Ambulatory Visit (HOSPITAL_BASED_OUTPATIENT_CLINIC_OR_DEPARTMENT_OTHER): Payer: Medicaid Other | Admitting: Physician Assistant

## 2011-10-12 ENCOUNTER — Encounter: Payer: Self-pay | Admitting: Physician Assistant

## 2011-10-12 VITALS — BP 123/74 | HR 101 | Temp 97.5°F | Resp 20 | Ht 65.0 in | Wt 169.6 lb

## 2011-10-12 DIAGNOSIS — C50419 Malignant neoplasm of upper-outer quadrant of unspecified female breast: Secondary | ICD-10-CM

## 2011-10-12 DIAGNOSIS — C787 Secondary malignant neoplasm of liver and intrahepatic bile duct: Secondary | ICD-10-CM

## 2011-10-12 LAB — CBC WITH DIFFERENTIAL/PLATELET
Eosinophils Absolute: 0 10*3/uL (ref 0.0–0.5)
MONO#: 0 10*3/uL — ABNORMAL LOW (ref 0.1–0.9)
NEUT#: 5.3 10*3/uL (ref 1.5–6.5)
RBC: 4.05 10*6/uL (ref 3.70–5.45)
RDW: 25.1 % — ABNORMAL HIGH (ref 11.2–14.5)
WBC: 5.6 10*3/uL (ref 3.9–10.3)
nRBC: 0 % (ref 0–0)

## 2011-10-12 MED ORDER — B COMPLEX PO TABS: 1.0000 | ORAL_TABLET | Freq: Every day | ORAL | Status: DC

## 2011-10-12 NOTE — Progress Notes (Signed)
ID: [name]   DOB: 20-Jul-1965  MR#: [id number]  BJY#:[id number]  HISTORY OF PRESENT ILLNESS: Tabetha Haraway is a 46 year-old [place] woman who first noted a small mass in her Left breast November 2011, shortly after she stopped breast-feeding. It did not disappear with resumption of her periods and grew over the next year, and particularly since she had her miscarrriage October 2012. This brought her to the Emergency Room 03/09/2011 where she was found to have a fluctuant area in the UOQ of the Left breast measuring 8.4 cm. The tissue beneath the cystic area extending towards the axilla and medially was described as firm, but not erythematous. The cystic area was lanced and a large amount of serosanguinos material issued.  The patient was referred to Dr. Marcille Blanco who saw her 03/11/2011. By this time the fluid had largely reaccumulated.[name] He incised the lesion, again draining serosanguinous liquid, packed it, and set the patient up [facility] 03[facility]14/2011. This showed an irregularly marginated mass measuring 7.3 cm, with abnormal axillary adenopathy. On 01/1102013 bilateral mammography confirmed a high-density mass in the Left UOQ. This was biopsied, as was a Left axillary lymph node. [facility] biopsies ([id number]) showed invasive ductal carcinoma, high grade, triple negative, with an Mib-1 of 97%. Subsequent treatments are as detailed below  INTERVAL HISTORY: Sergio returns today for followup of her locally advanced left breast carcinoma. She is due for day 1 cycle 5 of 6 planned cycles of TAC being given in the neoadjuvant setting.  Interval history is remarkable for worsening peripheral neuropathy affecting the toes bilaterally. Cameran has noticed that this is persistent, really never going away at this point. This is a significant change since her last appointment here on July 24. She is still able to walk, and does not feel particularly unsteady, but tells me she feels the  tingling almost all the time.  Fortunately, the upper extremities have been spared thus far.   REVIEW OF SYSTEMS: Iysis  continues to be mildly fatigued.  No mouth ulcers or oral sensitivity. She's had no fevers, chills, or night sweats, but does have hot flashes at night. She's had no specific skin changes but has noted some hyperpigmentation of the nailbeds bilaterally on the upper chest remedies. No pain or tenderness there. Fortunately, she's had no nausea.  She is utilizing a stool softener for mild constipation and occasionally notes a small amount of blood on the tissue following a bowel movement. No additional abnormal bleeding. Her cough has improved and she has had no  increased shortness of breath. No chest pain. No abnormal headaches or change in vision. No unusual pain or peripheral swelling.   Otherwise a detailed review of systems is stable and noncontributory.  PAST MEDICAL HISTORY: Past Medical History  Diagnosis Date  . Breast abscess   . No pertinent past medical history   . Cancer     lt br     PAST SURGICAL HISTORY: Past Surgical History  Procedure Date  . Dilation and curettage of uterus 01/04/11  . Breast mass excision 2013  . Tonsillectomy   . Portacath placement 04/12/2011    Procedure: INSERTION PORT-A-CATH;  Surgeon: Wilmon Arms. Tsuei, MD;  Location: [facility];  Service: General;  Laterality: Right;  right subclavian  . Incision and drainage breast abscess 03/31/11    FAMILY HISTORY Family History  Problem Relation Age of Onset  . Diabetes Mother   . Hypertension Mother   the patient's father  died from post-operative complications at age 56; the patient's mother is 59, lives in [place] The patient is a single child  GYNECOLOGIC HISTORY: first pregnancy to term age 32. 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 Her husband Riven Beebe is a Hospital doctor for a Acupuncturist in  [place]. Their daughter, [name], is 3. The family stays part-time with the patient's mother in [place]    ADVANCED DIRECTIVES:  HEALTH MAINTENANCE: History  Substance Use Topics  . Smoking status: Never Smoker   . Smokeless tobacco: Never Used  . Alcohol Use: Yes     occasional     Colonoscopy:  PAP:  Bone density:  Lipid panel:  Allergies  Allergen Reactions  . Sulfa Antibiotics Swelling    Current Outpatient Prescriptions  Medication Sig Dispense Refill  . b complex vitamins tablet Take 1 tablet by mouth daily.      [name] lidocaine-prilocaine (EMLA) cream Apply 1 application topically as needed. To port      . loratadine (CLARITIN) 10 MG tablet Take 10 mg by mouth daily.      [name] LORazepam (ATIVAN) 0.5 MG tablet Take 0.5 mg by mouth at bedtime as needed.      [name] morphine (MS CONTIN) 15 MG 12 hr tablet Take 1 tablet (15 mg total) by mouth 2 (two) times daily.  60 tablet  0  . naproxen sodium (ALEVE) 220 MG tablet Take 1 tablet (220 mg total) by mouth 3 (three) times daily with meals as needed (for fever or pain).  60 tablet  0  . ondansetron (ZOFRAN) 8 MG tablet Take by mouth every 8 (eight) hours as needed.      [name] oxyCODONE-acetaminophen (PERCOCET) 5-325 MG per tablet       . prochlorperazine (COMPAZINE) 10 MG tablet Take 10 mg by mouth every 6 (six) hours as needed. For nausea        OBJECTIVE:  Middle-aged [place] American woman in no acute distress Filed Vitals:   10/12/11 1154  BP: 123/74  Pulse: 101  Temp: 97.5 F (36.4 C)  Resp: 20     Body mass index is 28.22 kg/(m^2).    ECOG FS: 1  Filed Weights   10/12/11 1154  Weight: 169 lb 9.6 oz (76.93 kg)   Physical Exam: HEENT:  Sclerae anicteric, conjunctivae pink.  Oropharynx benign.  No mucositis or candidiasis.   Nodes:  No cervical or supraclavicular lymphadenopathy palpated. Breast Exam:  Complete breast exam was deferred. Lungs:  Clear to auscultation bilaterally.  No crackles, rhonchi, or wheezes.   Heart:  Regular  rate and rhythm.   Abdomen:  Soft, nontender.  Positive bowel sounds.  No organomegaly or masses palpated.   Musculoskeletal:  No focal spinal tenderness to palpation.  Extremities:  Benign.  No peripheral edema or cyanosis.   Skin:  Benign with the exception of mild hyperpigmentation in the upper tremor these, including the nailbeds. No drainage or evidence of infection. Neuro:  Nonfocal. Alert and oriented x3.   LAB RESULTS: Lab Results  Component Value Date   WBC 5.6 10/12/2011   NEUTROABS 5.3 10/12/2011   HGB 11.3* 10/12/2011   HCT 33.6* 10/12/2011   MCV 83.0 10/12/2011   PLT 532* 10/12/2011      Chemistry      Component Value Date/Time   NA 139 09/22/2011 1006   K 4.3 09/22/2011 1006   CL 104 09/22/2011 1006  CO2 30 09/22/2011 1006   BUN 12 09/22/2011 1006   CREATININE 0.64 09/22/2011 1006      Component Value Date/Time   CALCIUM 8.9 09/22/2011 1006   ALKPHOS 44 09/22/2011 1006   AST 17 09/22/2011 1006   ALT 12 09/22/2011 1006   BILITOT 0.2* 09/22/2011 1006       Lab Results  Component Value Date   LABCA2 13 07/29/2011    STUDIES: No results found.  ASSESSMENT: 46 y.o.  [place] woman with stage IV breast cancer,   (1)  s/p Left breast biopsy 03/18/2011 showing a clinical T4,N1 invasive ductal carcinoma, high grade, triple negative, with an Mib-1 of 97%, with a single liver metastasis pathologically confirmed 03/31/2011  (2) receiving neoadjuvant chemotherapy, the goal being to complete 6 q. three-week doses of docetaxel/cyclophosphamide/doxorubicin, with Neulasta on day 2 for granulocyte support.  (3)  status post hospitalization for febrile neutropenia following cycle 1.  (4) Symptomatic anemia, s/p feraheme 08/15/2011  PLAN:  After much discussion, we have decided to hold and Teresita's treatment today due to the increased peripheral neuropathy. She will start on a vitamin B complex daily. We will reassess the neuropathy when she returns next week on August 14, and we'll  hope to resume her neoadjuvant chemotherapy at that time.  Mikalah voices understanding and agreement with this plan, and will call with any changes or problems.    Freida Nebel    10/12/2011

## 2011-10-13 ENCOUNTER — Ambulatory Visit: Payer: Medicaid Other

## 2011-10-19 ENCOUNTER — Ambulatory Visit (HOSPITAL_BASED_OUTPATIENT_CLINIC_OR_DEPARTMENT_OTHER): Payer: Medicaid Other | Admitting: Physician Assistant

## 2011-10-19 ENCOUNTER — Other Ambulatory Visit: Payer: Self-pay | Admitting: Oncology

## 2011-10-19 ENCOUNTER — Ambulatory Visit (HOSPITAL_BASED_OUTPATIENT_CLINIC_OR_DEPARTMENT_OTHER): Payer: Medicaid Other

## 2011-10-19 ENCOUNTER — Other Ambulatory Visit: Payer: Medicaid Other | Admitting: Lab

## 2011-10-19 ENCOUNTER — Encounter: Payer: Self-pay | Admitting: Physician Assistant

## 2011-10-19 ENCOUNTER — Other Ambulatory Visit: Payer: Self-pay | Admitting: Physician Assistant

## 2011-10-19 ENCOUNTER — Telehealth: Payer: Self-pay | Admitting: Oncology

## 2011-10-19 VITALS — BP 124/83 | HR 96 | Temp 98.1°F | Resp 20 | Ht 65.0 in | Wt 167.8 lb

## 2011-10-19 DIAGNOSIS — C787 Secondary malignant neoplasm of liver and intrahepatic bile duct: Secondary | ICD-10-CM

## 2011-10-19 DIAGNOSIS — C50419 Malignant neoplasm of upper-outer quadrant of unspecified female breast: Secondary | ICD-10-CM

## 2011-10-19 DIAGNOSIS — G609 Hereditary and idiopathic neuropathy, unspecified: Secondary | ICD-10-CM

## 2011-10-19 DIAGNOSIS — D649 Anemia, unspecified: Secondary | ICD-10-CM

## 2011-10-19 DIAGNOSIS — C50919 Malignant neoplasm of unspecified site of unspecified female breast: Secondary | ICD-10-CM

## 2011-10-19 DIAGNOSIS — Z5111 Encounter for antineoplastic chemotherapy: Secondary | ICD-10-CM

## 2011-10-19 LAB — BASIC METABOLIC PANEL
BUN: 14 mg/dL (ref 6–23)
CO2: 23 mEq/L (ref 19–32)
Glucose, Bld: 185 mg/dL — ABNORMAL HIGH (ref 70–99)
Potassium: 3.7 mEq/L (ref 3.5–5.3)
Sodium: 136 mEq/L (ref 135–145)

## 2011-10-19 LAB — CBC WITH DIFFERENTIAL/PLATELET
Basophils Absolute: 0 10*3/uL (ref 0.0–0.1)
EOS%: 0 % (ref 0.0–7.0)
Eosinophils Absolute: 0 10*3/uL (ref 0.0–0.5)
HCT: 37.9 % (ref 34.8–46.6)
HGB: 12.6 g/dL (ref 11.6–15.9)
LYMPH%: 6.9 % — ABNORMAL LOW (ref 14.0–49.7)
MCH: 28.3 pg (ref 25.1–34.0)
MCV: 85.2 fL (ref 79.5–101.0)
MONO%: 0.6 % (ref 0.0–14.0)
NEUT#: 4.8 10*3/uL (ref 1.5–6.5)
NEUT%: 92.5 % — ABNORMAL HIGH (ref 38.4–76.8)
Platelets: 511 10*3/uL — ABNORMAL HIGH (ref 145–400)
RDW: 22.3 % — ABNORMAL HIGH (ref 11.2–14.5)

## 2011-10-19 MED ORDER — SODIUM CHLORIDE 0.9 % IV SOLN
800.0000 mg/m2 | Freq: Once | INTRAVENOUS | Status: AC
  Administered 2011-10-19: 1482 mg via INTRAVENOUS
  Filled 2011-10-19: qty 39

## 2011-10-19 MED ORDER — ONDANSETRON 16 MG/50ML IVPB (CHCC)
16.0000 mg | Freq: Once | INTRAVENOUS | Status: AC
  Administered 2011-10-19: 16 mg via INTRAVENOUS

## 2011-10-19 MED ORDER — SODIUM CHLORIDE 0.9 % IJ SOLN
10.0000 mL | INTRAMUSCULAR | Status: DC | PRN
  Filled 2011-10-19: qty 10

## 2011-10-19 MED ORDER — SODIUM CHLORIDE 0.9 % IV SOLN
750.0000 mg | Freq: Once | INTRAVENOUS | Status: AC
  Administered 2011-10-19: 750 mg via INTRAVENOUS
  Filled 2011-10-19: qty 75

## 2011-10-19 MED ORDER — HEPARIN SOD (PORK) LOCK FLUSH 100 UNIT/ML IV SOLN
500.0000 [IU] | Freq: Once | INTRAVENOUS | Status: DC | PRN
  Filled 2011-10-19: qty 5

## 2011-10-19 MED ORDER — DEXAMETHASONE SODIUM PHOSPHATE 4 MG/ML IJ SOLN
20.0000 mg | Freq: Once | INTRAMUSCULAR | Status: AC
  Administered 2011-10-19: 20 mg via INTRAVENOUS

## 2011-10-19 MED ORDER — SODIUM CHLORIDE 0.9 % IV SOLN
Freq: Once | INTRAVENOUS | Status: AC
  Administered 2011-10-19: 13:00:00 via INTRAVENOUS

## 2011-10-19 MED ORDER — DEXAMETHASONE 4 MG PO TABS: 4.0000 mg | ORAL_TABLET | Freq: Two times a day (BID) | ORAL | Status: DC

## 2011-10-19 NOTE — Patient Instructions (Addendum)
[facility] Discharge Instructions for Patients Receiving Chemotherapy  Today you received the following chemotherapy agents Gemzar/Carboplatin To help prevent nausea and vomiting after your treatment, we encourage you to take your nausea medication as directed by your provider. If you develop nausea and vomiting that is not controlled by your nausea medication, call the clinic. If it is after clinic hours your family physician or the after hours number for the clinic or go to the Emergency Department.   BELOW ARE SYMPTOMS THAT SHOULD BE REPORTED IMMEDIATELY:  *FEVER GREATER THAN 100.5 F  *CHILLS WITH OR WITHOUT FEVER  NAUSEA AND VOMITING THAT IS NOT CONTROLLED WITH YOUR NAUSEA MEDICATION  *UNUSUAL SHORTNESS OF BREATH  *UNUSUAL BRUISING OR BLEEDING  TENDERNESS IN MOUTH AND THROAT WITH OR WITHOUT PRESENCE OF ULCERS  *URINARY PROBLEMS  *BOWEL PROBLEMS  UNUSUAL RASH Items with * indicate a potential emergency and should be followed up as soon as possible.  One of the nurses will contact you 24 hours after your treatment. Please let the nurse know about any problems that you may have experienced. Feel free to call the clinic you have any questions or concerns. The clinic phone number is [phone].   I have been informed and understand all the instructions given to me. I know to contact the clinic, my physician, or go to the Emergency Department if any problems should occur. I do not have any questions at this time, but understand that I may call the clinic during office hours   should I have any questions or need assistance in obtaining follow up care.    __________________________________________  _____________  __________ Signature of Patient or Authorized Representative            Date                   Time    __________________________________________ Nurse's Signature

## 2011-10-19 NOTE — Progress Notes (Signed)
ID: [name]   DOB: 07-Mar-1966  MR#: [id number]  WUJ#:[id number]  HISTORY OF PRESENT ILLNESS: Alyssa May is a 46 year-old [place] woman who first noted a small mass in her Left breast November 2011, shortly after she stopped breast-feeding. It did not disappear with resumption of her periods and grew over the next year, and particularly since she had her miscarrriage October 2012. This brought her to the Emergency Room 03/09/2011 where she was found to have a fluctuant area in the UOQ of the Left breast measuring 8.4 cm. The tissue beneath the cystic area extending towards the axilla and medially was described as firm, but not erythematous. The cystic area was lanced and a large amount of serosanguinos material issued.  The patient was referred to Dr. Marcille Blanco who saw her 03/11/2011. By this time the fluid had largely reaccumulated.[name] He incised the lesion, again draining serosanguinous liquid, packed it, and set the patient up [facility]. This showed an irregularly marginated mass measuring 7.3 cm, with abnormal axillary adenopathy. On 01/1102013 bilateral mammography confirmed a high-density mass in the Left UOQ. This was biopsied, as was a Left axillary lymph node. [place] biopsies ([id number]) showed invasive ductal carcinoma, high grade, triple negative, with an Mib-1 of 97%. Subsequent treatments are as detailed below  INTERVAL HISTORY: Kindall returns today accompanied by her mother for followup of her locally advanced left breast carcinoma. She is due for day 1 cycle 5 of 6 planned cycles of TAC being given in the neoadjuvant setting, treatment having been held last week due to the development of peripheral neuropathy in the lower extremities.  The peripheral neuropathy has not changed at all since last week, and of course has not improved. It is affecting her feet bilaterally, and she tells me that her toes are completely numb, with some occasional  tingling. At this time it is not affecting her walking. Thus far, her upper extremities have been spared.   REVIEW OF SYSTEMS: Anneliese  continues to be mildly fatigued.  No mouth ulcers or oral sensitivity. She's had no fevers, chills, or night sweats, but does have hot flashes at night. She's had no specific other than hyperpigmentation of the nailbeds bilaterally on the hands. No pain or tenderness there. Fortunately, she's had no nausea.  She is utilizing a stool softener for mild constipation. No abnormal bleeding. Her cough has improved and she has had no  increased shortness of breath. No chest pain. No abnormal headaches or change in vision. No unusual pain or peripheral swelling.   Otherwise a detailed review of systems is stable and noncontributory.  PAST MEDICAL HISTORY: Past Medical History  Diagnosis Date  . Breast abscess   . No pertinent past medical history   . Cancer     lt br     PAST SURGICAL HISTORY: Past Surgical History  Procedure Date  . Dilation and curettage of uterus 01/04/11  . Breast mass excision 2013  . Tonsillectomy   . Portacath placement 04/12/2011    Procedure: INSERTION PORT-A-CATH;  Surgeon: Wilmon Arms. Tsuei, [place];  Location: [facility];  Service: General;  Laterality: Right;  right subclavian  . Incision and drainage breast abscess 03/31/11    FAMILY HISTORY Family History  Problem Relation Age of Onset  . Diabetes Mother   . Hypertension Mother   the patient's father died from post-operative complications at age 83; the patient's mother is 57, lives in [place] The patient  is a single child  GYNECOLOGIC HISTORY: first pregnancy to term age 40. 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 Her husband Brittanny Levenhagen is a Hospital doctor for a Acupuncturist in [place]. Their daughter, [name], is 3. The family stays part-time with the patient's mother in [place]    ADVANCED  DIRECTIVES:  HEALTH MAINTENANCE: History  Substance Use Topics  . Smoking status: Never Smoker   . Smokeless tobacco: Never Used  . Alcohol Use: Yes     occasional     Colonoscopy:  PAP:  Bone density:  Lipid panel:  Allergies  Allergen Reactions  . Sulfa Antibiotics Swelling    Current Outpatient Prescriptions  Medication Sig Dispense Refill  . b complex vitamins tablet Take 1 tablet by mouth daily.      [name] dexamethasone (DECADRON) 4 MG tablet Take 1 tablet (4 mg total) by mouth 2 (two) times daily with a meal. As directed. Start 1 day befor and 5 days after  30 tablet  3  . lidocaine-prilocaine (EMLA) cream Apply 1 application topically as needed. To port      . loratadine (CLARITIN) 10 MG tablet Take 10 mg by mouth daily.      [name] LORazepam (ATIVAN) 0.5 MG tablet Take 0.5 mg by mouth at bedtime as needed.      [name] morphine (MS CONTIN) 15 MG 12 hr tablet Take 1 tablet (15 mg total) by mouth 2 (two) times daily.  60 tablet  0  . naproxen sodium (ALEVE) 220 MG tablet Take 1 tablet (220 mg total) by mouth 3 (three) times daily with meals as needed (for fever or pain).  60 tablet  0  . ondansetron (ZOFRAN) 8 MG tablet Take by mouth every 8 (eight) hours as needed.      [name] oxyCODONE-acetaminophen (PERCOCET) 5-325 MG per tablet       . prochlorperazine (COMPAZINE) 10 MG tablet Take 10 mg by mouth every 6 (six) hours as needed. For nausea        OBJECTIVE:  Middle-aged [place] American woman in no acute distress Filed Vitals:   10/19/11 1144  BP: 124/83  Pulse: 96  Temp: 98.1 F (36.7 C)  Resp: 20     Body mass index is 27.92 kg/(m^2).    ECOG FS: 1  Filed Weights   10/19/11 1144  Weight: 167 lb 12.8 oz (76.114 kg)   Physical Exam: HEENT:  Sclerae anicteric, conjunctivae pink.  Oropharynx benign.  No mucositis or candidiasis.   Nodes:  No cervical or supraclavicular lymphadenopathy palpated. Breast Exam: The wound in the lateral edge of the left breast has improved  significantly. The tissue is pink and granulated, with only some mild drainage visible. Lungs:  Clear to auscultation bilaterally.  No crackles, rhonchi, or wheezes.   Heart:  Regular rate and rhythm.   Abdomen:  Soft, nontender.  Positive bowel sounds.  No organomegaly or masses palpated.   Musculoskeletal:  No focal spinal tenderness to palpation.  Extremities:  Benign.  No peripheral edema or cyanosis.   Skin:  Benign with the exception of mild hyperpigmentation in the upper tremor these, including the nailbeds. No drainage or evidence of infection. Neuro:  Nonfocal. Alert and oriented x3.   LAB RESULTS: Lab Results  Component Value Date   WBC 5.2 10/19/2011   NEUTROABS 4.8 10/19/2011   HGB 12.6 10/19/2011   HCT 37.9 10/19/2011   MCV 85.2 10/19/2011  PLT 511* 10/19/2011      Chemistry      Component Value Date/Time   NA 139 09/22/2011 1006   K 4.3 09/22/2011 1006   CL 104 09/22/2011 1006   CO2 30 09/22/2011 1006   BUN 12 09/22/2011 1006   CREATININE 0.64 09/22/2011 1006      Component Value Date/Time   CALCIUM 8.9 09/22/2011 1006   ALKPHOS 44 09/22/2011 1006   AST 17 09/22/2011 1006   ALT 12 09/22/2011 1006   BILITOT 0.2* 09/22/2011 1006       Lab Results  Component Value Date   LABCA2 13 07/29/2011    STUDIES: No results found.  ASSESSMENT: 46 y.o.  [place] woman with stage IV breast cancer,   (1)  s/p Left breast biopsy 03/18/2011 showing a clinical T4,N1 invasive ductal carcinoma, high grade, triple negative, with an Mib-1 of 97%, with a single liver metastasis pathologically confirmed 03/31/2011  (2) Status post 4 cycles of q. three-week docetaxel/doxorubicin/cyclophosphamide in the neoadjuvant setting, discontinued after 4 cycles due to to peripheral neuropathy.  Now initiating treatment with every 3 week carboplatin/gemcitabine, the goal being to complete an additional 4 cycles in the neoadjuvant setting.  (3)  status post hospitalization for febrile neutropenia  following cycle 1 of TAC.  (4) Symptomatic anemia, s/p feraheme 08/15/2011  PLAN:  This case was reviewed with Dr. Darnelle Catalan who also spoke with and examined the patient today.  After much discussion, we have decided to change gears and switch from TAC to Carbo/Gemzar for the remainder of Dilia as neoadjuvant therapy. She received both agents on day 1 of each 21 day cycle. Depending on her tolerance, we would really like to complete 4 additional cycles of treatment with the carbo/Gemzar, having completed 4 cycles of TAC. We again reviewed her antiemetic regimen and the only change being made is the fact that she will no longer need to begin the dexamethasone the day prior to chemotherapy.  Kaiyana will receive Neulasta tomorrow. She is scheduled to followup with Dr. Corliss Skains next week on August 22, and I will see her on August 23 for assessment chemotoxicity, including repeat labs. We will hope to initiate her second dose of carbo/gemcitabine on September 4.  Cigi is anxious to reassess the known metastasis in her liver. Accordingly, I am ordering an abdominal MRI with special attention to the liver to be obtained in mid-September prior to her scheduled appointment with Dr. Darnelle Catalan.  All this was reviewed in detail with [place] today who voices understanding and agreement with our plan. She will call any changes or problems.  I will mention that Stevi had received a jury summons from [facility], [place]. I have written her a letter explaining why she would be unable to participate in jury duty at this time.    Alisha Bacus    10/19/2011

## 2011-10-19 NOTE — Progress Notes (Signed)
VO from [place] to redraw BMET- send STAT.  OK to release treatment for gemzar. Carbo to be dosed based on labs.

## 2011-10-19 NOTE — Telephone Encounter (Signed)
S/w the pt and she is aware to pick up her appt calendar tomorrow for aug and sept

## 2011-10-20 ENCOUNTER — Ambulatory Visit (HOSPITAL_BASED_OUTPATIENT_CLINIC_OR_DEPARTMENT_OTHER): Payer: Medicaid Other

## 2011-10-20 ENCOUNTER — Telehealth: Payer: Self-pay | Admitting: *Deleted

## 2011-10-20 VITALS — BP 108/68 | HR 80 | Temp 97.2°F

## 2011-10-20 DIAGNOSIS — C787 Secondary malignant neoplasm of liver and intrahepatic bile duct: Secondary | ICD-10-CM

## 2011-10-20 DIAGNOSIS — N6320 Unspecified lump in the left breast, unspecified quadrant: Secondary | ICD-10-CM

## 2011-10-20 DIAGNOSIS — C801 Malignant (primary) neoplasm, unspecified: Secondary | ICD-10-CM

## 2011-10-20 DIAGNOSIS — C50419 Malignant neoplasm of upper-outer quadrant of unspecified female breast: Secondary | ICD-10-CM

## 2011-10-20 MED ORDER — PEGFILGRASTIM INJECTION 6 MG/0.6ML
6.0000 mg | Freq: Once | SUBCUTANEOUS | Status: AC
  Administered 2011-10-20: 6 mg via SUBCUTANEOUS
  Filled 2011-10-20: qty 0.6

## 2011-10-20 NOTE — Telephone Encounter (Signed)
Per staff message and POF I have scheduled appt.  JMW  

## 2011-10-26 ENCOUNTER — Ambulatory Visit: Payer: Medicaid Other

## 2011-10-27 ENCOUNTER — Encounter (INDEPENDENT_AMBULATORY_CARE_PROVIDER_SITE_OTHER): Payer: Self-pay | Admitting: Surgery

## 2011-10-27 ENCOUNTER — Ambulatory Visit: Payer: Medicaid Other

## 2011-10-27 ENCOUNTER — Ambulatory Visit (INDEPENDENT_AMBULATORY_CARE_PROVIDER_SITE_OTHER): Payer: Medicaid Other | Admitting: Surgery

## 2011-10-27 VITALS — BP 101/66 | HR 77 | Temp 97.6°F | Resp 18 | Ht 65.0 in | Wt 163.8 lb

## 2011-10-27 DIAGNOSIS — C50419 Malignant neoplasm of upper-outer quadrant of unspecified female breast: Secondary | ICD-10-CM

## 2011-10-27 NOTE — Progress Notes (Signed)
The patient comes in for another wound check. Due to neuropathy, her chemotherapy regimen has been changed. Apparently this will extend the amount of time that she will be on chemotherapy. She has a repeat MRI in September to reevaluate her liver mass. Her wound is healing quite well. It has completely filled with granulation tissue and shows minimal drainage. It measures about 3 x 5 cm. It is nontender. Minimal drainage. Continue with dressing changes until the wound is completely covered with new skin. Followup one month. We will likely present her again in breast conference to discuss recommendations for further therapy.  Wilmon Arms. Corliss Skains, MD, [facility]  10/27/2011 12:16 PM

## 2011-10-28 ENCOUNTER — Encounter: Payer: Self-pay | Admitting: Physician Assistant

## 2011-10-28 ENCOUNTER — Ambulatory Visit (HOSPITAL_BASED_OUTPATIENT_CLINIC_OR_DEPARTMENT_OTHER): Payer: Medicaid Other | Admitting: Physician Assistant

## 2011-10-28 ENCOUNTER — Other Ambulatory Visit (HOSPITAL_BASED_OUTPATIENT_CLINIC_OR_DEPARTMENT_OTHER): Payer: Medicaid Other | Admitting: Lab

## 2011-10-28 VITALS — BP 111/79 | HR 86 | Temp 98.2°F | Resp 20 | Ht 65.0 in | Wt 165.4 lb

## 2011-10-28 DIAGNOSIS — Z171 Estrogen receptor negative status [ER-]: Secondary | ICD-10-CM

## 2011-10-28 DIAGNOSIS — C787 Secondary malignant neoplasm of liver and intrahepatic bile duct: Secondary | ICD-10-CM

## 2011-10-28 DIAGNOSIS — C50419 Malignant neoplasm of upper-outer quadrant of unspecified female breast: Secondary | ICD-10-CM

## 2011-10-28 DIAGNOSIS — C773 Secondary and unspecified malignant neoplasm of axilla and upper limb lymph nodes: Secondary | ICD-10-CM

## 2011-10-28 LAB — CBC WITH DIFFERENTIAL/PLATELET
Basophils Absolute: 0 10*3/uL (ref 0.0–0.1)
EOS%: 2.1 % (ref 0.0–7.0)
HGB: 11.2 g/dL — ABNORMAL LOW (ref 11.6–15.9)
MCH: 29.6 pg (ref 25.1–34.0)
MCV: 86.3 fL (ref 79.5–101.0)
MONO%: 15.3 % — ABNORMAL HIGH (ref 0.0–14.0)
NEUT#: 1.7 10*3/uL (ref 1.5–6.5)
RBC: 3.79 10*6/uL (ref 3.70–5.45)
RDW: 16.8 % — ABNORMAL HIGH (ref 11.2–14.5)
lymph#: 0.6 10*3/uL — ABNORMAL LOW (ref 0.9–3.3)
nRBC: 0 % (ref 0–0)

## 2011-10-28 NOTE — Progress Notes (Signed)
ID: Alyssa May   DOB: 21-Aug-1965  MR#: [id number]  JXB#:[id number]  HISTORY OF PRESENT ILLNESS: Alyssa May is a 46 year-old [place] woman who first noted a small mass in her Left breast November 2011, shortly after she stopped breast-feeding. It did not disappear with resumption of her periods and grew over the next year, and particularly since she had her miscarrriage October 2012. This brought her to the Emergency Room 03/09/2011 where she was found to have a fluctuant area in the UOQ of the Left breast measuring 8.4 cm. The tissue beneath the cystic area extending towards the axilla and medially was described as firm, but not erythematous. The cystic area was lanced and a large amount of serosanguinos material issued.  The patient was referred to Dr. Marcille Blanco who saw her 03/11/2011. By this time the fluid had largely reaccumulated.[name] He incised the lesion, again draining serosanguinous liquid, packed it, and set the patient up [facility] 03[facility]14/2011. This showed an irregularly marginated mass measuring 7.3 cm, with abnormal axillary adenopathy. On 01/1102013 bilateral mammography confirmed a high-density mass in the Left UOQ. This was biopsied, as was a Left axillary lymph node. [place] biopsies ([id number]) showed invasive ductal carcinoma, high grade, triple negative, with an Mib-1 of 97%. Subsequent treatments are as detailed below  INTERVAL HISTORY: Alyssa May returns today accompanied by her husband for followup of her locally advanced left breast carcinoma. She is currently day 10 cycle 1 of carbo/gemcitabine given every 3 weeks in the neoadjuvant setting.  Recall that Trinidi completed 4 cycles of TAC which was then discontinued due to the development of peripheral neuropathy in the lower extremities.  Peripheral neuropathy is stable, but has not improved since last week and she still feels like her toes are numb. She tells me it has been a difficult week for her. Her  husband tells me she has had some mood swings. She also had nausea and 2 episodes of emesis despite her antinausea medications. She tells me the "chemicals were so strong" that she could "smell it coming our of her pores". In fact her husband tells me he can smell this chemical over as well. She's been very tired and has "not felt like herself" until today. She is finally feeling a little better.  I will mention that Alyssa May saw Dr. Corliss Skains yesterday, and he continues to be pleased with her response to chemotherapy.   REVIEW OF SYSTEMS: Alyssa May  continues to be fatigued.  No mouth ulcers or oral sensitivity. She's had no fevers, chills, or night sweats, but does have hot flashes at night. She's had no specific  skin changes other than hyperpigmentation of the nailbeds bilaterally on the hands. No pain or tenderness there.  She is utilizing a stool softener for mild constipation. No abnormal bleeding. Her cough has resolved and she has had no  increased shortness of breath. No chest pain. No abnormal headaches or change in vision. No unusual pain or peripheral swelling.   Otherwise a detailed review of systems is stable and noncontributory.  PAST MEDICAL HISTORY: Past Medical History  Diagnosis Date  . Breast abscess   . No pertinent past medical history   . Cancer     lt br     PAST SURGICAL HISTORY: Past Surgical History  Procedure Date  . Dilation and curettage of uterus 01/04/11  . Breast mass excision 2013  . Tonsillectomy   . Portacath placement 04/12/2011    Procedure: INSERTION PORT-A-CATH;  Surgeon: Wilmon Arms. Tsuei, [place];  Location: [facility];  Service: General;  Laterality: Right;  right subclavian  . Incision and drainage breast abscess 03/31/11    FAMILY HISTORY Family History  Problem Relation Age of Onset  . Diabetes Mother   . Hypertension Mother   the patient's father died from post-operative complications at age 65; the patient's mother is 65, lives in [place] The  patient is a single child  GYNECOLOGIC HISTORY: first pregnancy to term age 58. 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 Her husband Alyssa May is a Hospital doctor for a Acupuncturist in [place]. Their daughter, Alyssa May, is 3. The family stays part-time with the patient's mother in [place]    ADVANCED DIRECTIVES:  HEALTH MAINTENANCE: History  Substance Use Topics  . Smoking status: Never Smoker   . Smokeless tobacco: Never Used  . Alcohol Use: Yes     occasional     Colonoscopy:  PAP:  Bone density:  Lipid panel:  Allergies  Allergen Reactions  . Sulfa Antibiotics Swelling    Current Outpatient Prescriptions  Medication Sig Dispense Refill  . b complex vitamins tablet Take 1 tablet by mouth daily.      [name] dexamethasone (DECADRON) 4 MG tablet Take 1 tablet (4 mg total) by mouth 2 (two) times daily with a meal. As directed. Start 1 day befor and 5 days after  30 tablet  3  . lidocaine-prilocaine (EMLA) cream Apply 1 application topically as needed. To port      . loratadine (CLARITIN) 10 MG tablet Take 10 mg by mouth daily.      [name] LORazepam (ATIVAN) 0.5 MG tablet Take 0.5 mg by mouth at bedtime as needed.      [name] morphine (MS CONTIN) 15 MG 12 hr tablet Take 1 tablet (15 mg total) by mouth 2 (two) times daily.  60 tablet  0  . naproxen sodium (ALEVE) 220 MG tablet Take 1 tablet (220 mg total) by mouth 3 (three) times daily with meals as needed (for fever or pain).  60 tablet  0  . ondansetron (ZOFRAN) 8 MG tablet Take by mouth every 8 (eight) hours as needed.      [name] oxyCODONE-acetaminophen (PERCOCET) 5-325 MG per tablet       . prochlorperazine (COMPAZINE) 10 MG tablet Take 10 mg by mouth every 6 (six) hours as needed. For nausea        OBJECTIVE:  Middle-aged [place] American woman who appears tired, but in no acute distress Filed Vitals:   10/28/11 1343  BP: 111/79  Pulse: 86  Temp: 98.2 F (36.8 C)    Resp: 20     Body mass index is 27.52 kg/(m^2).    ECOG FS: 1  Filed Weights   10/28/11 1343  Weight: 165 lb 6.4 oz (75.025 kg)   Physical Exam: HEENT:  Sclerae anicteric.  Oropharynx benign.   Nodes:  No cervical or supraclavicular lymphadenopathy palpated. Breast Exam: Deferred today.  Lungs:  Clear to auscultation bilaterally.   Heart:  Regular rate and rhythm.   Abdomen:  Soft, nontender.  Positive bowel sounds.  Musculoskeletal:  No focal spinal tenderness to palpation.  Extremities:  Benign.  No peripheral edema or cyanosis.   Skin:  Benign with the exception of mild hyperpigmentation in the upper extremities, including the nailbeds. No drainage or evidence of infection in the nails. Neuro:  Nonfocal. Alert and  oriented x3.   LAB RESULTS: Lab Results  Component Value Date   WBC 2.8* 10/28/2011   NEUTROABS 1.7 10/28/2011   HGB 11.2* 10/28/2011   HCT 32.7* 10/28/2011   MCV 86.3 10/28/2011   PLT 92* 10/28/2011      Chemistry      Component Value Date/Time   NA 136 10/19/2011 1516   K 3.7 10/19/2011 1516   CL 100 10/19/2011 1516   CO2 23 10/19/2011 1516   BUN 14 10/19/2011 1516   CREATININE 0.56 10/19/2011 1516      Component Value Date/Time   CALCIUM 9.2 10/19/2011 1516   ALKPHOS 44 09/22/2011 1006   AST 17 09/22/2011 1006   ALT 12 09/22/2011 1006   BILITOT 0.2* 09/22/2011 1006       Lab Results  Component Value Date   LABCA2 13 07/29/2011    STUDIES: No results found.  ASSESSMENT: 46 y.o.  [place] woman with stage IV breast cancer,   (1)  s/p Left breast biopsy 03/18/2011 showing a clinical T4,N1 invasive ductal carcinoma, high grade, triple negative, with an Mib-1 of 97%, with a single liver metastasis pathologically confirmed 03/31/2011  (2) Status post 4 cycles of q. three-week docetaxel/doxorubicin/cyclophosphamide in the neoadjuvant setting, discontinued after 4 cycles due to to peripheral neuropathy.  Now initiating treatment with every 3 week  carboplatin/gemcitabine, the goal being to complete an additional 4 cycles in the neoadjuvant setting.  (3)  status post hospitalization for febrile neutropenia following cycle 1 of TAC.  (4) Symptomatic anemia, s/p feraheme 08/15/2011  PLAN:  Scott agrees to come back in 2 weeks on September 4 for repeat labs and physical exam in anticipation of her second dose of carbo/gemcitabine. We will reassess at that time, and decide whether or not we need to make any changes in her antinausea regimen. Specifically, perhaps we could decrease her dexamethasone and try a different anti-emetic. As noted last week, depending on her tolerance, we would really like to complete 4 additional cycles of treatment with the carbo/Gemzar, having completed 4 cycles of TAC.   Ekta is anxious to reassess the known metastasis in her liver. Accordingly, an abdominal MRI with special attention to the liver has been ordered for  mid-September prior to her scheduled appointment with Dr. Darnelle Catalan.  All this was reviewed in detail with Alyssa May today who voices understanding and agreement with our plan. She will call any changes or problems.  Thadius Smisek    10/28/2011

## 2011-11-02 ENCOUNTER — Ambulatory Visit: Payer: Medicaid Other | Admitting: Oncology

## 2011-11-02 ENCOUNTER — Ambulatory Visit: Payer: Medicaid Other

## 2011-11-02 ENCOUNTER — Other Ambulatory Visit: Payer: Medicaid Other | Admitting: Lab

## 2011-11-09 ENCOUNTER — Ambulatory Visit (HOSPITAL_BASED_OUTPATIENT_CLINIC_OR_DEPARTMENT_OTHER): Payer: Medicaid Other

## 2011-11-09 ENCOUNTER — Ambulatory Visit (HOSPITAL_BASED_OUTPATIENT_CLINIC_OR_DEPARTMENT_OTHER): Payer: Medicaid Other | Admitting: Physician Assistant

## 2011-11-09 ENCOUNTER — Telehealth: Payer: Self-pay | Admitting: Oncology

## 2011-11-09 ENCOUNTER — Encounter: Payer: Self-pay | Admitting: Physician Assistant

## 2011-11-09 ENCOUNTER — Other Ambulatory Visit (HOSPITAL_BASED_OUTPATIENT_CLINIC_OR_DEPARTMENT_OTHER): Payer: Medicaid Other | Admitting: Lab

## 2011-11-09 VITALS — BP 128/67 | HR 92 | Temp 98.1°F | Resp 20 | Ht 65.0 in | Wt 174.6 lb

## 2011-11-09 DIAGNOSIS — C787 Secondary malignant neoplasm of liver and intrahepatic bile duct: Secondary | ICD-10-CM

## 2011-11-09 DIAGNOSIS — C50419 Malignant neoplasm of upper-outer quadrant of unspecified female breast: Secondary | ICD-10-CM

## 2011-11-09 DIAGNOSIS — Z5111 Encounter for antineoplastic chemotherapy: Secondary | ICD-10-CM

## 2011-11-09 DIAGNOSIS — C801 Malignant (primary) neoplasm, unspecified: Secondary | ICD-10-CM

## 2011-11-09 DIAGNOSIS — N6320 Unspecified lump in the left breast, unspecified quadrant: Secondary | ICD-10-CM

## 2011-11-09 DIAGNOSIS — C773 Secondary and unspecified malignant neoplasm of axilla and upper limb lymph nodes: Secondary | ICD-10-CM

## 2011-11-09 DIAGNOSIS — D649 Anemia, unspecified: Secondary | ICD-10-CM

## 2011-11-09 LAB — CBC WITH DIFFERENTIAL/PLATELET
BASO%: 0 % (ref 0.0–2.0)
Basophils Absolute: 0 10*3/uL (ref 0.0–0.1)
HCT: 32.3 % — ABNORMAL LOW (ref 34.8–46.6)
HGB: 11 g/dL — ABNORMAL LOW (ref 11.6–15.9)
LYMPH%: 8.1 % — ABNORMAL LOW (ref 14.0–49.7)
MCHC: 34.1 g/dL (ref 31.5–36.0)
MONO#: 0 10*3/uL — ABNORMAL LOW (ref 0.1–0.9)
NEUT%: 91.5 % — ABNORMAL HIGH (ref 38.4–76.8)
Platelets: 520 10*3/uL — ABNORMAL HIGH (ref 145–400)
WBC: 4.7 10*3/uL (ref 3.9–10.3)

## 2011-11-09 LAB — COMPREHENSIVE METABOLIC PANEL (CC13)
ALT: 17 U/L (ref 0–55)
AST: 19 U/L (ref 5–34)
Albumin: 3.6 g/dL (ref 3.5–5.0)
Alkaline Phosphatase: 62 U/L (ref 40–150)
Glucose: 132 mg/dl — ABNORMAL HIGH (ref 70–99)
Potassium: 4.6 mEq/L (ref 3.5–5.1)
Sodium: 139 mEq/L (ref 136–145)
Total Bilirubin: 0.3 mg/dL (ref 0.20–1.20)
Total Protein: 7 g/dL (ref 6.4–8.3)

## 2011-11-09 MED ORDER — DEXAMETHASONE SODIUM PHOSPHATE 4 MG/ML IJ SOLN
20.0000 mg | Freq: Once | INTRAMUSCULAR | Status: AC
  Administered 2011-11-09: 20 mg via INTRAVENOUS

## 2011-11-09 MED ORDER — SODIUM CHLORIDE 0.9 % IV SOLN
750.0000 mg | Freq: Once | INTRAVENOUS | Status: AC
  Administered 2011-11-09: 750 mg via INTRAVENOUS
  Filled 2011-11-09: qty 75

## 2011-11-09 MED ORDER — SODIUM CHLORIDE 0.9 % IV SOLN
800.0000 mg/m2 | Freq: Once | INTRAVENOUS | Status: AC
  Administered 2011-11-09: 1482 mg via INTRAVENOUS
  Filled 2011-11-09: qty 39

## 2011-11-09 MED ORDER — ONDANSETRON 16 MG/50ML IVPB (CHCC)
16.0000 mg | Freq: Once | INTRAVENOUS | Status: AC
  Administered 2011-11-09: 16 mg via INTRAVENOUS

## 2011-11-09 MED ORDER — SODIUM CHLORIDE 0.9 % IV SOLN
Freq: Once | INTRAVENOUS | Status: AC
  Administered 2011-11-09: 13:00:00 via INTRAVENOUS

## 2011-11-09 MED ORDER — HEPARIN SOD (PORK) LOCK FLUSH 100 UNIT/ML IV SOLN
500.0000 [IU] | Freq: Once | INTRAVENOUS | Status: AC | PRN
  Administered 2011-11-09: 500 [IU]
  Filled 2011-11-09: qty 5

## 2011-11-09 MED ORDER — SODIUM CHLORIDE 0.9 % IJ SOLN
10.0000 mL | INTRAMUSCULAR | Status: DC | PRN
  Administered 2011-11-09: 10 mL
  Filled 2011-11-09: qty 10

## 2011-11-09 NOTE — Telephone Encounter (Signed)
gve the pt her oct 2013 appt calendar °

## 2011-11-09 NOTE — Patient Instructions (Signed)
[facility] Discharge Instructions for Patients Receiving Chemotherapy  Today you received the following chemotherapy agents Carboplatin and Gemzar  To help prevent nausea and vomiting after your treatment, we encourage you to take your nausea medication as prescribed.   If you develop nausea and vomiting that is not controlled by your nausea medication, call the clinic. If it is after clinic hours your family physician or the after hours number for the clinic or go to the Emergency Department.   BELOW ARE SYMPTOMS THAT SHOULD BE REPORTED IMMEDIATELY:  *FEVER GREATER THAN 100.5 F  *CHILLS WITH OR WITHOUT FEVER  NAUSEA AND VOMITING THAT IS NOT CONTROLLED WITH YOUR NAUSEA MEDICATION  *UNUSUAL SHORTNESS OF BREATH  *UNUSUAL BRUISING OR BLEEDING  TENDERNESS IN MOUTH AND THROAT WITH OR WITHOUT PRESENCE OF ULCERS  *URINARY PROBLEMS  *BOWEL PROBLEMS  UNUSUAL RASH Items with * indicate a potential emergency and should be followed up as soon as possible.  One of the nurses will contact you 24 hours after your treatment. Please let the nurse know about any problems that you may have experienced. Feel free to call the clinic you have any questions or concerns. The clinic phone number is [phone].   I have been informed and understand all the instructions given to me. I know to contact the clinic, my physician, or go to the Emergency Department if any problems should occur. I do not have any questions at this time, but understand that I may call the clinic during office hours   should I have any questions or need assistance in obtaining follow up care.    __________________________________________  _____________  __________ Signature of Patient or Authorized Representative            Date                   Time    __________________________________________ Nurse's Signature

## 2011-11-09 NOTE — Progress Notes (Signed)
ID: Alyssa May   DOB: 1965-12-31  MR#: [id number]  WUJ#:[id number]  HISTORY OF PRESENT ILLNESS: Alyssa May is a 46 year-old [place] woman who first noted a small mass in her Left breast November 2011, shortly after she stopped breast-feeding. It did not disappear with resumption of her periods and grew over the next year, and particularly since she had her miscarrriage October 2012. This brought her to the Emergency Room 03/09/2011 where she was found to have a fluctuant area in the UOQ of the Left breast measuring 8.4 cm. The tissue beneath the cystic area extending towards the axilla and medially was described as firm, but not erythematous. The cystic area was lanced and a large amount of serosanguinos material issued.  The patient was referred to Dr. Marcille Blanco who saw her 03/11/2011. By this time the fluid had largely reaccumulated.Alyssa May He incised the lesion, again draining serosanguinous liquid, packed it, and set the patient up [facility]. This showed an irregularly marginated mass measuring 7.3 cm, with abnormal axillary adenopathy. On 01/1102013 bilateral mammography confirmed a high-density mass in the Left UOQ. This was biopsied, as was a Left axillary lymph node. [place] biopsies ([id number]) showed invasive ductal carcinoma, high grade, triple negative, with an Mib-1 of 97%. Subsequent treatments are as detailed below  INTERVAL HISTORY: [name] returns today  for followup of her locally advanced left breast carcinoma. She is ready to initiate day 1 cycle 2 of carbo/gemcitabine given every 3 weeks in the neoadjuvant setting.  Recall that Anvika completed 4 cycles of TAC which was then discontinued due to the development of peripheral neuropathy in the lower extremities.  Sharyn's energy level has improved since her last visit here, and she is feeling much better. She and her husband took their daughter to [employer] over the weekend. They rode  go-carts and played miniature golf which [name] really enjoyed.    REVIEW OF SYSTEMS: Alysse continues to have peripheral neuropathy, primarily affecting the lower extremities. This is affecting the toes bilaterally and although she tells me it is "still there", and has not worsened or changed. Her heels were a little sore earlier this week and she had some peeling of the skin. This has improved. Alyssa May denies any fevers or chills. She's eating well denies nausea or change in bowel habits. She currently has no significant cough, phlegm production, or increased shortness of breath. She's had no chest pain, and denies abnormal headaches. She also denies any unusual myalgias or arthralgias, and has had no peripheral swelling.  Otherwise a detailed review of systems is stable and noncontributory.  PAST MEDICAL HISTORY: Past Medical History  Diagnosis Date  . Breast abscess   . No pertinent past medical history   . Cancer     lt br     PAST SURGICAL HISTORY: Past Surgical History  Procedure Date  . Dilation and curettage of uterus 01/04/11  . Breast mass excision 2013  . Tonsillectomy   . Portacath placement 04/12/2011    Procedure: INSERTION PORT-A-CATH;  Surgeon: Wilmon Arms. Tsuei, [place];  Location: [facility];  Service: General;  Laterality: Right;  right subclavian  . Incision and drainage breast abscess 03/31/11    FAMILY HISTORY Family History  Problem Relation Age of Onset  . Diabetes Mother   . Hypertension Mother   the patient's father died from post-operative complications at age 38; the patient's mother is 62, lives in [place] The patient is a single  child  GYNECOLOGIC HISTORY: first pregnancy to term age 55. 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 Her husband [name] is a Hospital doctor for a Acupuncturist in [place]. Their daughter, Alyssa May, is 3. The family stays part-time with the patient's mother  in [place]    ADVANCED DIRECTIVES:  HEALTH MAINTENANCE: History  Substance Use Topics  . Smoking status: Never Smoker   . Smokeless tobacco: Never Used  . Alcohol Use: Yes     occasional     Colonoscopy:  PAP:  Bone density:  Lipid panel:  Allergies  Allergen Reactions  . Sulfa Antibiotics Swelling    Current Outpatient Prescriptions  Medication Sig Dispense Refill  . b complex vitamins tablet Take 1 tablet by mouth daily.      Alyssa May dexamethasone (DECADRON) 4 MG tablet Take 1 tablet (4 mg total) by mouth 2 (two) times daily with a meal. As directed. Start 1 day befor and 5 days after  30 tablet  3  . lidocaine-prilocaine (EMLA) cream Apply 1 application topically as needed. To port      . loratadine (CLARITIN) 10 MG tablet Take 10 mg by mouth daily.      Alyssa May LORazepam (ATIVAN) 0.5 MG tablet Take 0.5 mg by mouth at bedtime as needed.      Alyssa May morphine (MS CONTIN) 15 MG 12 hr tablet Take 1 tablet (15 mg total) by mouth 2 (two) times daily.  60 tablet  0  . naproxen sodium (ALEVE) 220 MG tablet Take 1 tablet (220 mg total) by mouth 3 (three) times daily with meals as needed (for fever or pain).  60 tablet  0  . ondansetron (ZOFRAN) 8 MG tablet Take by mouth every 8 (eight) hours as needed.      Alyssa May oxyCODONE-acetaminophen (PERCOCET) 5-325 MG per tablet       . prochlorperazine (COMPAZINE) 10 MG tablet Take 10 mg by mouth every 6 (six) hours as needed. For nausea       No current facility-administered medications for this visit.   Facility-Administered Medications Ordered in Other Visits  Medication Dose Route Frequency Provider Last Rate Last Dose  . 0.9 %  sodium chloride infusion   Intravenous Once Jayce Boyko Allegra Grana, PA      . CARBOplatin (PARAPLATIN) 750 mg in sodium chloride 0.9 % 250 mL chemo infusion  750 mg Intravenous Once Catalina Gravel, PA   750 mg at 11/09/11 1429  . dexamethasone (DECADRON) injection 20 mg  20 mg Intravenous Once Catalina Gravel, PA   20 mg at 11/09/11 1323  . gemcitabine  (GEMZAR) 1,482 mg in sodium chloride 0.9 % 100 mL chemo infusion  800 mg/m2 (Treatment Plan Actual) Intravenous Once Catalina Gravel, PA   1,482 mg at 11/09/11 1351  . heparin lock flush 100 unit/mL  500 Units Intracatheter Once PRN Catalina Gravel, PA   500 Units at 11/09/11 1508  . ondansetron (ZOFRAN) IVPB 16 mg  16 mg Intravenous Once Catalina Gravel, PA   16 mg at 11/09/11 1323  . sodium chloride 0.9 % injection 10 mL  10 mL Intracatheter PRN Catalina Gravel, PA   10 mL at 11/09/11 1508    OBJECTIVE:  Middle-aged [place] American woman who appears tired, but in no acute distress Filed Vitals:   11/09/11 1214  BP: 128/67  Pulse: 92  Temp: 98.1 F (36.7 C)  Resp: 20  Body mass index is 29.05 kg/(m^2).    ECOG FS: 1  Filed Weights   11/09/11 1214  Weight: 174 lb 9.6 oz (79.198 kg)   Physical Exam: HEENT:  Sclerae anicteric.  Oropharynx benign.   Nodes:  No cervical or supraclavicular lymphadenopathy palpated. Breast Exam: Deferred today.  Lungs:  Clear to auscultation bilaterally.   Heart:  Regular rate and rhythm.   Abdomen:  Soft, nontender.  Positive bowel sounds.  Musculoskeletal:  No focal spinal tenderness to palpation.  Extremities:  Benign.  No peripheral edema or cyanosis.   Skin:  Mild hyperpigmentation in the upper extremities, including the nailbeds. No drainage or evidence of infection in the nails.  There is no significant cracking or peeling, and no erythema noted on the palms of the hands or soles of the feet. Neuro:  Nonfocal. Alert and oriented x3.   LAB RESULTS: Lab Results  Component Value Date   WBC 4.7 11/09/2011   NEUTROABS 4.3 11/09/2011   HGB 11.0* 11/09/2011   HCT 32.3* 11/09/2011   MCV 87.8 11/09/2011   PLT 520* 11/09/2011      Chemistry      Component Value Date/Time   NA 139 11/09/2011 1129   NA 136 10/19/2011 1516   K 4.6 11/09/2011 1129   K 3.7 10/19/2011 1516   CL 105 11/09/2011 1129   CL 100 10/19/2011 1516   CO2 22 11/09/2011 1129   CO2 23 10/19/2011 1516   BUN  13.0 11/09/2011 1129   BUN 14 10/19/2011 1516   CREATININE 0.9 11/09/2011 1129   CREATININE 0.56 10/19/2011 1516      Component Value Date/Time   CALCIUM 9.4 11/09/2011 1129   CALCIUM 9.2 10/19/2011 1516   ALKPHOS 62 11/09/2011 1129   ALKPHOS 44 09/22/2011 1006   AST 19 11/09/2011 1129   AST 17 09/22/2011 1006   ALT 17 11/09/2011 1129   ALT 12 09/22/2011 1006   BILITOT 0.30 11/09/2011 1129   BILITOT 0.2* 09/22/2011 1006       Lab Results  Component Value Date   LABCA2 13 07/29/2011    STUDIES: No results found.  ASSESSMENT: 46 y.o.  [place] woman with stage IV breast cancer,   (1)  s/p Left breast biopsy 03/18/2011 showing a clinical T4,N1 invasive ductal carcinoma, high grade, triple negative, with an Mib-1 of 97%, with a single liver metastasis pathologically confirmed 03/31/2011  (2) Status post 4 cycles of q. three-week docetaxel/doxorubicin/cyclophosphamide in the neoadjuvant setting, discontinued after 4 cycles due to to peripheral neuropathy.  Now initiating treatment with every 3 week carboplatin/gemcitabine, the goal being to complete an additional 4 cycles in the neoadjuvant setting.  (3)  status post hospitalization for febrile neutropenia following cycle 1 of TAC.  (4) Symptomatic anemia, s/p feraheme 08/15/2011  PLAN:  Alyssa May is ready to proceed with her second dose of carboplatin/gemcitabine today as scheduled. She will receive Neulasta tomorrow, and I will see her again next week for assessment of chemotoxicity on September 11.  Alyssa May is scheduled for an MRI of the abdomen on September 16 to reassess the node metastasis in her liver. She will see Dr. Darnelle Catalan soon thereafter to review those results.  As noted last week, depending on her tolerance, we would really like to complete 4 additional cycles of treatment with the carbo/Gemzar, having completed 4 cycles of TAC.  All this was reviewed in detail with [place] today who voices understanding and agreement with our plan. She will  call any changes  or problems.  Miyo Aina    11/09/2011

## 2011-11-10 ENCOUNTER — Ambulatory Visit (HOSPITAL_BASED_OUTPATIENT_CLINIC_OR_DEPARTMENT_OTHER): Payer: Medicaid Other

## 2011-11-10 VITALS — BP 114/75 | HR 94 | Temp 98.3°F

## 2011-11-10 DIAGNOSIS — Z5189 Encounter for other specified aftercare: Secondary | ICD-10-CM

## 2011-11-10 DIAGNOSIS — N6320 Unspecified lump in the left breast, unspecified quadrant: Secondary | ICD-10-CM

## 2011-11-10 DIAGNOSIS — C801 Malignant (primary) neoplasm, unspecified: Secondary | ICD-10-CM

## 2011-11-10 DIAGNOSIS — C50419 Malignant neoplasm of upper-outer quadrant of unspecified female breast: Secondary | ICD-10-CM

## 2011-11-10 DIAGNOSIS — C773 Secondary and unspecified malignant neoplasm of axilla and upper limb lymph nodes: Secondary | ICD-10-CM

## 2011-11-10 DIAGNOSIS — C787 Secondary malignant neoplasm of liver and intrahepatic bile duct: Secondary | ICD-10-CM

## 2011-11-10 MED ORDER — PEGFILGRASTIM INJECTION 6 MG/0.6ML
6.0000 mg | Freq: Once | SUBCUTANEOUS | Status: AC
  Administered 2011-11-10: 6 mg via SUBCUTANEOUS
  Filled 2011-11-10: qty 0.6

## 2011-11-16 ENCOUNTER — Encounter: Payer: Self-pay | Admitting: Physician Assistant

## 2011-11-16 ENCOUNTER — Other Ambulatory Visit (HOSPITAL_BASED_OUTPATIENT_CLINIC_OR_DEPARTMENT_OTHER): Payer: Medicaid Other | Admitting: Lab

## 2011-11-16 ENCOUNTER — Ambulatory Visit (HOSPITAL_BASED_OUTPATIENT_CLINIC_OR_DEPARTMENT_OTHER): Payer: Medicaid Other | Admitting: Physician Assistant

## 2011-11-16 ENCOUNTER — Ambulatory Visit: Payer: Medicaid Other

## 2011-11-16 VITALS — BP 110/76 | HR 81 | Temp 99.1°F | Resp 20 | Ht 65.0 in | Wt 170.9 lb

## 2011-11-16 DIAGNOSIS — C50919 Malignant neoplasm of unspecified site of unspecified female breast: Secondary | ICD-10-CM

## 2011-11-16 DIAGNOSIS — C787 Secondary malignant neoplasm of liver and intrahepatic bile duct: Secondary | ICD-10-CM

## 2011-11-16 DIAGNOSIS — C773 Secondary and unspecified malignant neoplasm of axilla and upper limb lymph nodes: Secondary | ICD-10-CM

## 2011-11-16 DIAGNOSIS — C50419 Malignant neoplasm of upper-outer quadrant of unspecified female breast: Secondary | ICD-10-CM

## 2011-11-16 DIAGNOSIS — D649 Anemia, unspecified: Secondary | ICD-10-CM

## 2011-11-16 LAB — CBC WITH DIFFERENTIAL/PLATELET
BASO%: 0.8 % (ref 0.0–2.0)
EOS%: 0.2 % (ref 0.0–7.0)
MCH: 30.2 pg (ref 25.1–34.0)
MCHC: 33.2 g/dL (ref 31.5–36.0)
MCV: 91.1 fL (ref 79.5–101.0)
MONO%: 5.1 % (ref 0.0–14.0)
RBC: 3.81 10*6/uL (ref 3.70–5.45)
RDW: 16 % — ABNORMAL HIGH (ref 11.2–14.5)
lymph#: 1 10*3/uL (ref 0.9–3.3)

## 2011-11-16 MED ORDER — TOBRAMYCIN-DEXAMETHASONE 0.3-0.1 % OP SUSP: 1.0000 [drp] | Freq: Two times a day (BID) | OPHTHALMIC | Status: DC

## 2011-11-16 NOTE — Progress Notes (Signed)
ID: [name]   DOB: February 11, 1966  MR#: [id number]  LKG#:[id number]  HISTORY OF PRESENT ILLNESS: Alyssa May is a 46 year-old [place] woman who first noted a small mass in her Left breast November 2011, shortly after she stopped breast-feeding. It did not disappear with resumption of her periods and grew over the next year, and particularly since she had her miscarrriage October 2012. This brought her to the Emergency Room 03/09/2011 where she was found to have a fluctuant area in the UOQ of the Left breast measuring 8.4 cm. The tissue beneath the cystic area extending towards the axilla and medially was described as firm, but not erythematous. The cystic area was lanced and a large amount of serosanguinos material issued.  The patient was referred to Dr. Marcille Blanco who saw her 03/11/2011. By this time the fluid had largely reaccumulated.[name] He incised the lesion, again draining serosanguinous liquid, packed it, and set the patient up [facility]. This showed an irregularly marginated mass measuring 7.3 cm, with abnormal axillary adenopathy. On 01/1102013 bilateral mammography confirmed a high-density mass in the Left UOQ. This was biopsied, as was a Left axillary lymph node. [facility] biopsies ([id number]) showed invasive ductal carcinoma, high grade, triple negative, with an Mib-1 of 97%. Subsequent treatments are as detailed below  INTERVAL HISTORY: Alyssa May returns today  for followup of her locally advanced left breast carcinoma. She is currently day 8 cycle 2 of carbo/gemcitabine given every 3 weeks in the neoadjuvant setting.  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  Alyssa May tells me the second cycle of carbo/gemcitabine was definitely easier to tolerate than the first. Her biggest complaint continues to be hot flashes. She did develop some oral thrush with a  slight sore throat last week, and started back on her Diflucan. This is now almost completely resolved. The neuropathy in her toes is stable, neither worsens nor improved.  REVIEW OF SYSTEMS: Alyssa May denies any fevers or chills. She's eating well denies nausea or change in bowel habits. She currently has no significant cough, phlegm production, or increased shortness of breath. She's had no chest pain, and denies abnormal headaches or dizziness. She also denies any unusual myalgias or arthralgias, and has had no peripheral swelling. Her energy level has improved slightly.  Otherwise a detailed review of systems is stable and noncontributory.  PAST MEDICAL HISTORY: Past Medical History  Diagnosis Date  . Breast abscess   . No pertinent past medical history   . Cancer     lt br     PAST SURGICAL HISTORY: Past Surgical History  Procedure Date  . Dilation and curettage of uterus 01/04/11  . Breast mass excision 2013  . Tonsillectomy   . Portacath placement 04/12/2011    Procedure: INSERTION PORT-A-CATH;  Surgeon: Wilmon Arms. Tsuei, [place];  Location: [facility];  Service: General;  Laterality: Right;  right subclavian  . Incision and drainage breast abscess 03/31/11    FAMILY HISTORY Family History  Problem Relation Age of Onset  . Diabetes Mother   . Hypertension Mother   the patient's father died from post-operative complications at age 16; the patient's mother is 65, lives in [place] The patient is a single child  GYNECOLOGIC HISTORY: first pregnancy to term age 39. 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 Her husband Alyssa May is a Hospital doctor for a Acupuncturist in [place]. Their daughter, Alyssa May, is 3. The family stays part-time with the patient's mother in [place]    ADVANCED DIRECTIVES:  HEALTH MAINTENANCE: History  Substance Use Topics  . Smoking status: Never Smoker   . Smokeless tobacco: Never  Used  . Alcohol Use: Yes     occasional     Colonoscopy:  PAP:  Bone density:  Lipid panel:  Allergies  Allergen Reactions  . Sulfa Antibiotics Swelling    Current Outpatient Prescriptions  Medication Sig Dispense Refill  . b complex vitamins tablet Take 1 tablet by mouth daily.      [name] dexamethasone (DECADRON) 4 MG tablet Take 1 tablet (4 mg total) by mouth 2 (two) times daily with a meal. As directed. Start 1 day befor and 5 days after  30 tablet  3  . lidocaine-prilocaine (EMLA) cream Apply 1 application topically as needed. To port      . loratadine (CLARITIN) 10 MG tablet Take 10 mg by mouth daily.      [name] LORazepam (ATIVAN) 0.5 MG tablet Take 0.5 mg by mouth at bedtime as needed.      [name] morphine (MS CONTIN) 15 MG 12 hr tablet Take 1 tablet (15 mg total) by mouth 2 (two) times daily.  60 tablet  0  . naproxen sodium (ALEVE) 220 MG tablet Take 1 tablet (220 mg total) by mouth 3 (three) times daily with meals as needed (for fever or pain).  60 tablet  0  . ondansetron (ZOFRAN) 8 MG tablet Take by mouth every 8 (eight) hours as needed.      [name] oxyCODONE-acetaminophen (PERCOCET) 5-325 MG per tablet       . prochlorperazine (COMPAZINE) 10 MG tablet Take 10 mg by mouth every 6 (six) hours as needed. For nausea      . tobramycin-dexamethasone (TOBRADEX) ophthalmic solution Place 1 drop into both eyes 2 (two) times daily.  5 mL  1    OBJECTIVE:  Middle-aged [place] American woman who appears tired, but in no acute distress Filed Vitals:   11/16/11 1436  BP: 110/76  Pulse: 81  Temp: 99.1 F (37.3 C)  Resp: 20     Body mass index is 28.44 kg/(m^2).    ECOG FS: 1  Filed Weights   11/16/11 1436  Weight: 170 lb 14.4 oz (77.52 kg)   Physical Exam: HEENT:  Sclerae anicteric.  Oropharynx shows small patches of candidiasis in the posterior buccal mucosa.   Nodes:  No cervical or supraclavicular lymphadenopathy palpated. Breast Exam: Deferred today.  Lungs:  Clear to auscultation  bilaterally.   Heart:  Regular rate and rhythm.   Abdomen:  Soft, nontender.  Positive bowel sounds.  Musculoskeletal:  No focal spinal tenderness to palpation.  Extremities:  Benign.  No peripheral edema or cyanosis.   Skin:  Mild hyperpigmentation in the upper extremities, including the nailbeds. No drainage or evidence of infection in the nails.   Neuro:  Nonfocal. Alert and oriented x3.   LAB RESULTS: Lab Results  Component Value Date   WBC 4.7 11/09/2011   NEUTROABS 4.3 11/09/2011   HGB 11.0* 11/09/2011   HCT 32.3* 11/09/2011   MCV 87.8 11/09/2011   PLT 520* 11/09/2011   CBC with Diff  On 11/16/11:   WBC = 13.1;  ANC = 11.3;  Hgb = 11.5;  Hct = 34.7;  Platelets = 331000     Chemistry  Component Value Date/Time   NA 139 11/09/2011 1129   NA 136 10/19/2011 1516   K 4.6 11/09/2011 1129   K 3.7 10/19/2011 1516   CL 105 11/09/2011 1129   CL 100 10/19/2011 1516   CO2 22 11/09/2011 1129   CO2 23 10/19/2011 1516   BUN 13.0 11/09/2011 1129   BUN 14 10/19/2011 1516   CREATININE 0.9 11/09/2011 1129   CREATININE 0.56 10/19/2011 1516      Component Value Date/Time   CALCIUM 9.4 11/09/2011 1129   CALCIUM 9.2 10/19/2011 1516   ALKPHOS 62 11/09/2011 1129   ALKPHOS 44 09/22/2011 1006   AST 19 11/09/2011 1129   AST 17 09/22/2011 1006   ALT 17 11/09/2011 1129   ALT 12 09/22/2011 1006   BILITOT 0.30 11/09/2011 1129   BILITOT 0.2* 09/22/2011 1006       Lab Results  Component Value Date   LABCA2 13 07/29/2011    STUDIES: No results found.  ASSESSMENT: 46 y.o.  [place] woman with stage IV breast cancer,   (1)  s/p Left breast biopsy 03/18/2011 showing a clinical T4,N1 invasive ductal carcinoma, high grade, triple negative, with an Mib-1 of 97%, with a single liver metastasis pathologically confirmed 03/31/2011  (2) Status post 4 cycles of q. three-week docetaxel/doxorubicin/cyclophosphamide in the neoadjuvant setting, discontinued after 4 cycles due to to peripheral neuropathy.  Now initiating treatment with  every 3 week carboplatin/gemcitabine, the goal being to complete an additional 4 cycles in the neoadjuvant setting.  (3)  status post hospitalization for febrile neutropenia following cycle 1 of TAC.  (4) Symptomatic anemia, s/p feraheme 08/15/2011  PLAN:  Amberlynn tolerated her second dose of carboplatin/gemcitabine quite well. She'll complete out her course of Diflucan for mild oral thrush.   Lorri is scheduled for an MRI of the abdomen on September 16 to reassess the node metastasis in her liver. She will see Dr. Darnelle Catalan soon thereafter to review those results.  As noted last week, depending on her tolerance, we would really like to complete 4 additional cycles of treatment with the carbo/Gemzar, having completed 4 cycles of TAC.  All this was reviewed in detail with Alyssa May today who voices understanding and agreement with our plan. She will call any changes or problems.  Alyssa May    11/16/2011

## 2011-11-21 ENCOUNTER — Other Ambulatory Visit: Payer: Self-pay | Admitting: Physician Assistant

## 2011-11-21 ENCOUNTER — Ambulatory Visit (HOSPITAL_COMMUNITY)
Admission: RE | Admit: 2011-11-21 | Discharge: 2011-11-21 | Disposition: A | Discharge status: Home / Self Care | Payer: Medicaid Other | Source: Ambulatory Visit | Attending: Physician Assistant | Admitting: Physician Assistant

## 2011-11-21 DIAGNOSIS — C50919 Malignant neoplasm of unspecified site of unspecified female breast: Secondary | ICD-10-CM

## 2011-11-21 DIAGNOSIS — C50419 Malignant neoplasm of upper-outer quadrant of unspecified female breast: Secondary | ICD-10-CM | POA: Insufficient documentation

## 2011-11-21 DIAGNOSIS — K7689 Other specified diseases of liver: Secondary | ICD-10-CM | POA: Insufficient documentation

## 2011-11-21 MED ORDER — GADOBENATE DIMEGLUMINE 529 MG/ML IV SOLN
15.0000 mL | Freq: Once | INTRAVENOUS | Status: AC | PRN
  Administered 2011-11-21: 15 mL via INTRAVENOUS

## 2011-11-22 ENCOUNTER — Other Ambulatory Visit: Payer: Medicaid Other | Admitting: Lab

## 2011-11-22 ENCOUNTER — Ambulatory Visit: Payer: Medicaid Other | Admitting: Oncology

## 2011-11-29 ENCOUNTER — Encounter (INDEPENDENT_AMBULATORY_CARE_PROVIDER_SITE_OTHER): Payer: Medicaid Other | Admitting: Surgery

## 2011-11-29 ENCOUNTER — Telehealth: Payer: Self-pay | Admitting: *Deleted

## 2011-11-29 NOTE — Telephone Encounter (Signed)
Patient called and left a message to call her back. Patient needed to cancel her appts for tomorrow and reschedule to next week. Patient had death in family. Desk RN aware. Patient called and notified the I got her message and will call her back once I hear from desk RN. JMW

## 2011-11-29 NOTE — Telephone Encounter (Signed)
Per desk RN ok to move chemo appt from tomorrow to next week. Appt moved and patient called. JMW

## 2011-11-30 ENCOUNTER — Ambulatory Visit: Payer: Medicaid Other

## 2011-11-30 ENCOUNTER — Ambulatory Visit: Payer: Medicaid Other | Admitting: Physician Assistant

## 2011-11-30 ENCOUNTER — Other Ambulatory Visit: Payer: Medicaid Other | Admitting: Lab

## 2011-12-01 ENCOUNTER — Ambulatory Visit: Payer: Medicaid Other

## 2011-12-05 ENCOUNTER — Other Ambulatory Visit: Payer: Self-pay | Admitting: Oncology

## 2011-12-07 ENCOUNTER — Ambulatory Visit: Payer: Medicaid Other

## 2011-12-07 ENCOUNTER — Telehealth: Payer: Self-pay | Admitting: *Deleted

## 2011-12-07 ENCOUNTER — Encounter: Payer: Self-pay | Admitting: Physician Assistant

## 2011-12-07 ENCOUNTER — Other Ambulatory Visit (HOSPITAL_BASED_OUTPATIENT_CLINIC_OR_DEPARTMENT_OTHER): Payer: Medicaid Other | Admitting: Lab

## 2011-12-07 ENCOUNTER — Ambulatory Visit (HOSPITAL_BASED_OUTPATIENT_CLINIC_OR_DEPARTMENT_OTHER): Payer: Medicaid Other | Admitting: Physician Assistant

## 2011-12-07 VITALS — BP 111/78 | HR 97 | Temp 99.1°F | Resp 20 | Ht 65.0 in | Wt 178.2 lb

## 2011-12-07 DIAGNOSIS — C50419 Malignant neoplasm of upper-outer quadrant of unspecified female breast: Secondary | ICD-10-CM

## 2011-12-07 DIAGNOSIS — D649 Anemia, unspecified: Secondary | ICD-10-CM

## 2011-12-07 DIAGNOSIS — C787 Secondary malignant neoplasm of liver and intrahepatic bile duct: Secondary | ICD-10-CM

## 2011-12-07 DIAGNOSIS — Z171 Estrogen receptor negative status [ER-]: Secondary | ICD-10-CM

## 2011-12-07 LAB — CBC WITH DIFFERENTIAL/PLATELET
BASO%: 0.4 % (ref 0.0–2.0)
Basophils Absolute: 0 10e3/uL (ref 0.0–0.1)
EOS%: 0.7 % (ref 0.0–7.0)
Eosinophils Absolute: 0 10e3/uL (ref 0.0–0.5)
HCT: 30.9 % — ABNORMAL LOW (ref 34.8–46.6)
HGB: 10.3 g/dL — ABNORMAL LOW (ref 11.6–15.9)
LYMPH%: 37.4 % (ref 14.0–49.7)
MCH: 30.3 pg (ref 25.1–34.0)
MCHC: 33.3 g/dL (ref 31.5–36.0)
MCV: 90.9 fL (ref 79.5–101.0)
MONO#: 0.5 10e3/uL (ref 0.1–0.9)
MONO%: 17.8 % — ABNORMAL HIGH (ref 0.0–14.0)
NEUT#: 1.2 10e3/uL — ABNORMAL LOW (ref 1.5–6.5)
NEUT%: 43.7 % (ref 38.4–76.8)
Platelets: 472 10e3/uL — ABNORMAL HIGH (ref 145–400)
RBC: 3.4 10e6/uL — ABNORMAL LOW (ref 3.70–5.45)
RDW: 16 % — ABNORMAL HIGH (ref 11.2–14.5)
WBC: 2.8 10e3/uL — ABNORMAL LOW (ref 3.9–10.3)
lymph#: 1.1 10e3/uL (ref 0.9–3.3)
nRBC: 0 % (ref 0–0)

## 2011-12-07 NOTE — Progress Notes (Signed)
ID: Alyssa May   DOB: [birth year]  MR#: [id number]  AOZ#:[id number]  HISTORY OF PRESENT ILLNESS: Alyssa May is a 46 year-old [place] woman who first noted a small mass in her Left breast November 2011, shortly after she stopped breast-feeding. It did not disappear with resumption of her periods and grew over the next year, and particularly since she had her miscarrriage October 2012. This brought her to the Emergency Room 03/09/2011 where she was found to have a fluctuant area in the UOQ of the Left breast measuring 8.4 cm. The tissue beneath the cystic area extending towards the axilla and medially was described as firm, but not erythematous. The cystic area was lanced and a large amount of serosanguinos material issued.  The patient was referred to Dr. Marcille Blanco who saw her 03/11/2011. By this time the fluid had largely reaccumulated.[name] He incised the lesion, again draining serosanguinous liquid, packed it, and set the patient up [facility]. This showed an irregularly marginated mass measuring 7.3 cm, with abnormal axillary adenopathy. On 01/1102013 bilateral mammography confirmed a high-density mass in the Left UOQ. This was biopsied, as was a Left axillary lymph node. [place] biopsies ([id number]) showed invasive ductal carcinoma, high grade, triple negative, with an Mib-1 of 97%. Subsequent treatments are as detailed below  INTERVAL HISTORY: Alyssa May returns today  for followup of her locally advanced left breast carcinoma. She is currently due for day 1 cycle 3 of 4 planned cycles of carbo/gemcitabine given every 3 weeks in the neoadjuvant setting.  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  Alyssa May is having a lot of pain in her joints, especially the lower extremities. Her feet, knees, hips, back, and hands are all hurting, and this is all limiting her  day-to-day activities. Her feet swell on and off. She feels stiff when she gets out of bed in the morning. Although this improves slightly throughout the day, it never resolves. She has not tried taking pain medication, and would prefer not to.  REVIEW OF SYSTEMS: Sayda denies any fevers or chills. She's eating well denies nausea or change in bowel habits. She currently has no significant cough, phlegm production, or increased shortness of breath. She's had no chest pain, and denies abnormal headaches or dizziness. She also denies any unusual myalgias or arthralgias, and has had no peripheral swelling. She has some residual neuropathy secondary to previous chemotherapy agents. Her energy level is low.  Otherwise a detailed review of systems is stable and noncontributory.  PAST MEDICAL HISTORY: Past Medical History  Diagnosis Date  . Breast abscess   . No pertinent past medical history   . Cancer     lt br     PAST SURGICAL HISTORY: Past Surgical History  Procedure Date  . Dilation and curettage of uterus 01/04/11  . Breast mass excision 2013  . Tonsillectomy   . Portacath placement 04/12/2011    Procedure: INSERTION PORT-A-CATH;  Surgeon: Wilmon Arms. Tsuei, [place];  Location: [facility];  Service: General;  Laterality: Right;  right subclavian  . Incision and drainage breast abscess 03/31/11    FAMILY HISTORY Family History  Problem Relation Age of Onset  . Diabetes Mother   . Hypertension Mother   the patient's father died from post-operative complications at age 20; the patient's mother is 43, lives in [place] The patient is a single child  GYNECOLOGIC HISTORY: first pregnancy to term age 77. 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 Her husband Reighn Kaplan is a Hospital doctor for a Acupuncturist in [place]. Their daughter, [name], is 3. The family stays part-time with the patient's mother in [place]    ADVANCED  DIRECTIVES:  HEALTH MAINTENANCE: History  Substance Use Topics  . Smoking status: Never Smoker   . Smokeless tobacco: Never Used  . Alcohol Use: Yes     occasional     Colonoscopy:  PAP:  Bone density:  Lipid panel:  Allergies  Allergen Reactions  . Sulfa Antibiotics Swelling    Current Outpatient Prescriptions  Medication Sig Dispense Refill  . b complex vitamins tablet Take 1 tablet by mouth daily.      [name] dexamethasone (DECADRON) 4 MG tablet Take 1 tablet (4 mg total) by mouth 2 (two) times daily with a meal. As directed. Start 1 day befor and 5 days after  30 tablet  3  . lidocaine-prilocaine (EMLA) cream Apply 1 application topically as needed. To port      . loratadine (CLARITIN) 10 MG tablet Take 10 mg by mouth daily.      [name] LORazepam (ATIVAN) 0.5 MG tablet Take 0.5 mg by mouth at bedtime as needed.      . milk thistle 175 MG tablet Take 175 mg by mouth daily.      . Multiple Vitamin (MULTIVITAMIN) tablet Take 1 tablet by mouth daily.      . ondansetron (ZOFRAN) 8 MG tablet Take by mouth every 8 (eight) hours as needed.      . prochlorperazine (COMPAZINE) 10 MG tablet Take 10 mg by mouth every 6 (six) hours as needed. For nausea      . tobramycin-dexamethasone (TOBRADEX) ophthalmic solution Place 1 drop into both eyes 2 (two) times daily.  5 mL  1  . morphine (MS CONTIN) 15 MG 12 hr tablet Take 1 tablet (15 mg total) by mouth 2 (two) times daily.  60 tablet  0  . naproxen sodium (ALEVE) 220 MG tablet Take 1 tablet (220 mg total) by mouth 3 (three) times daily with meals as needed (for fever or pain).  60 tablet  0  . oxyCODONE-acetaminophen (PERCOCET) 5-325 MG per tablet         OBJECTIVE:  Middle-aged [place] American woman who appears tired, but in no acute distress Filed Vitals:   12/07/11 1115  BP: 111/78  Pulse: 97  Temp: 99.1 F (37.3 C)  Resp: 20     Body mass index is 29.65 kg/(m^2).    ECOG FS: 2  Filed Weights   12/07/11 1115  Weight: 178 lb 3.2 oz  (80.831 kg)   Physical Exam: HEENT:  Sclerae anicteric.  Oropharynx clear. Nodes:  No cervical or supraclavicular lymphadenopathy palpated. Breast Exam: The improvement in the left breast is remarkable, with only a small scabbed area remaining on the lateral edge of the left breast. Lungs:  Clear to auscultation bilaterally.   Heart:  Regular rate and rhythm.   Abdomen:  Soft, nontender.  Positive bowel sounds.  Musculoskeletal:  No focal spinal tenderness to palpation.  Extremities:  Benign.  No peripheral edema or cyanosis.   Skin:  Mild hyperpigmentation in the upper extremities, including the nailbeds. No drainage or evidence of infection in the nails.   Neuro:  Nonfocal. Alert and oriented x3.   LAB RESULTS: Lab Results  Component Value Date   WBC 2.8* 12/07/2011   NEUTROABS 1.2* 12/07/2011   HGB 10.3* 12/07/2011   HCT 30.9* 12/07/2011   MCV 90.9 12/07/2011   PLT 472* 12/07/2011       Chemistry      Component Value Date/Time   NA 139 11/09/2011 1129   NA 136 10/19/2011 1516   K 4.6 11/09/2011 1129   K 3.7 10/19/2011 1516   CL 105 11/09/2011 1129   CL 100 10/19/2011 1516   CO2 22 11/09/2011 1129   CO2 23 10/19/2011 1516   BUN 13.0 11/09/2011 1129   BUN 14 10/19/2011 1516   CREATININE 0.9 11/09/2011 1129   CREATININE 0.56 10/19/2011 1516      Component Value Date/Time   CALCIUM 9.4 11/09/2011 1129   CALCIUM 9.2 10/19/2011 1516   ALKPHOS 62 11/09/2011 1129   ALKPHOS 44 09/22/2011 1006   AST 19 11/09/2011 1129   AST 17 09/22/2011 1006   ALT 17 11/09/2011 1129   ALT 12 09/22/2011 1006   BILITOT 0.30 11/09/2011 1129   BILITOT 0.2* 09/22/2011 1006       Lab Results  Component Value Date   LABCA2 13 07/29/2011    STUDIES: Mr Abdomen W Wo Contrast  11/21/2011  *RADIOLOGY REPORT*  Clinical Data: Metastatic breast cancer  MRI ABDOMEN WITH AND WITHOUT CONTRAST  Technique:  Multiplanar multisequence MR imaging of the abdomen was performed both before and after administration of intravenous contrast.   Contrast: 15mL MULTIHANCE GADOBENATE DIMEGLUMINE 529 MG/ML IV SOLN  Comparison: PET CT and CT chest dated 04/22/2011  Findings: Irregularity of the lateral left breast (series 3/image 1), incompletely visualized, likely corresponding to known primary breast cancer.  10 x 14 mm rim enhancing lesion in the lateral segment left hepatic lobe (series 1101/image 43), compatible with a hepatic metastasis, possibly minimally decreased.  Spleen, pancreas, and adrenal glands are within normal limits.  Gallbladder is unremarkable.  No intrahepatic or extrahepatic ductal dilatation.  Kidneys are within normal limits.  No hydronephrosis.  No abdominal ascites.  No suspicious abdominal lymphadenopathy.  No focal osseous lesions.  IMPRESSION: 10 x 14 mm rim enhancing lesion in the lateral segment left hepatic lobe, compatible with a hepatic metastasis, possibly minimally decreased.  Irregularity of the lateral left breast, incompletely visualized, likely corresponding to known primary breast cancer.   Original Report Authenticated By: Charline Bills, M.D.      ASSESSMENT: 46 y.o.  [place] woman with stage IV breast cancer,   (1)  s/p Left breast biopsy 03/18/2011 showing a clinical T4,N1 invasive ductal carcinoma, high grade, triple negative, with an Mib-1 of 97%, with a single liver metastasis pathologically confirmed 03/31/2011  (2) Status post 4 cycles of q. three-week docetaxel/doxorubicin/cyclophosphamide in the neoadjuvant setting, discontinued after 4 cycles due to to peripheral neuropathy.  Status post 2 cycles of every 3 week carboplatin/gemcitabine, for a total of 6 neoadjuvant cycles altogether.  (3)  status post hospitalization for febrile neutropenia following cycle 1 of TAC.  (4) Symptomatic anemia, s/p feraheme 08/15/2011  PLAN:  This case was reviewed with Dr. Darnelle Catalan who also spoke with the patient extensively today. He feels that it would be prudent to discontinue chemotherapy at this time,  and Alyssa May has completed a total of 6 neoadjuvant cycles of chemotherapy. The plan will be to proceed with surgery, followed by radiation therapy. He will see her in approximately 4-6 weeks for reassessment and to discuss future treatment of the liver metastases. This might involve  surgical removal which she would likely schedule through [facility].   Alyssa May is already scheduled to see Dr. Corliss Skains next week ans is ready to schedule her breast surgery.  We will see her back here in late October to review our treatment plan. Alyssa May voices understanding and agreement with this plan, and knows to call with any changes or problems.  Mylz Yuan    12/07/2011

## 2011-12-07 NOTE — Telephone Encounter (Signed)
Labs and 60 min with GM 10/28  Patient is aware of the appointment

## 2011-12-08 ENCOUNTER — Ambulatory Visit: Payer: Medicaid Other

## 2011-12-12 ENCOUNTER — Encounter (INDEPENDENT_AMBULATORY_CARE_PROVIDER_SITE_OTHER): Payer: Self-pay | Admitting: Surgery

## 2011-12-12 ENCOUNTER — Ambulatory Visit (INDEPENDENT_AMBULATORY_CARE_PROVIDER_SITE_OTHER): Payer: Medicaid Other | Admitting: Surgery

## 2011-12-12 VITALS — BP 126/64 | HR 92 | Temp 96.6°F | Resp 18 | Ht 65.0 in | Wt 176.6 lb

## 2011-12-12 DIAGNOSIS — C50419 Malignant neoplasm of upper-outer quadrant of unspecified female breast: Secondary | ICD-10-CM

## 2011-12-12 NOTE — Progress Notes (Signed)
Patient ID: Alyssa May, female   DOB: 1965/09/24, 46 y.o.   MRN: [id number]  Chief Complaint  Patient presents with  . Routine Post Op    Lft Breast    HPI Alyssa May is a 46 y.o. female.  Referred by Dr. Darnelle Catalan for evaluation of left mastectomy HPI HISTORY OF PRESENT ILLNESS:  Alyssa May is a 46 year-old [place] woman who first noted a small mass in her Left breast November 2011, shortly after she stopped breast-feeding. It did not disappear with resumption of her periods and grew over the next year, and particularly since she had her miscarrriage October 2012. This brought her to the Emergency Room 03/09/2011 where she was found to have a fluctuant area in the UOQ of the Left breast measuring 8.4 cm. The tissue beneath the cystic area extending towards the axilla and medially was described as firm, but not erythematous. The cystic area was lanced and a large amount of serosanguinos material issued. I initially saw her 03/11/2011. By this time the fluid had largely reaccumulated. I incised the lesion, again draining serosanguinous liquid, packed it, and set the patient up [facility]14[facility]. This showed an irregularly marginated mass measuring 7.3 cm, with abnormal axillary adenopathy. On 01/1102013 bilateral mammography confirmed a high-density mass in the Left UOQ. This was biopsied, as was a Left axillary lymph node. Both biopsies ([id number]) showed invasive ductal carcinoma, high grade, triple negative, with an Mib-1 of 97%. She was also found to have a solitary liver metastases.  She recently stopped her chemotherapy due to significant side effects.  The plan is to proceed with surgery, followed by radiation.    Past Medical History  Diagnosis Date  . Breast abscess   . No pertinent past medical history   . Cancer     lt br     Past Surgical History  Procedure Date  . Dilation and curettage of uterus 01/04/11  . Breast mass excision  2013  . Tonsillectomy   . Portacath placement 04/12/2011    Procedure: INSERTION PORT-A-CATH;  Surgeon: Wilmon Arms. Andrienne Havener, [place];  Location: [facility];  Service: General;  Laterality: Right;  right subclavian  . Incision and drainage breast abscess 03/31/11    Family History  Problem Relation Age of Onset  . Diabetes Mother   . Hypertension Mother     Social History History  Substance Use Topics  . Smoking status: Never Smoker   . Smokeless tobacco: Never Used  . Alcohol Use: Yes     occasional    Allergies  Allergen Reactions  . Sulfa Antibiotics Swelling    Current Outpatient Prescriptions  Medication Sig Dispense Refill  . b complex vitamins tablet Take 1 tablet by mouth daily.      . milk thistle 175 MG tablet Take 175 mg by mouth daily.      . Multiple Vitamin (MULTIVITAMIN) tablet Take 1 tablet by mouth daily.      [name] dexamethasone (DECADRON) 4 MG tablet Take 1 tablet (4 mg total) by mouth 2 (two) times daily with a meal. As directed. Start 1 day befor and 5 days after  30 tablet  3  . lidocaine-prilocaine (EMLA) cream Apply 1 application topically as needed. To port      . loratadine (CLARITIN) 10 MG tablet Take 10 mg by mouth daily.      [name] LORazepam (ATIVAN) 0.5 MG tablet Take 0.5 mg by mouth at bedtime as needed.      [name]  morphine (MS CONTIN) 15 MG 12 hr tablet Take 1 tablet (15 mg total) by mouth 2 (two) times daily.  60 tablet  0  . naproxen sodium (ALEVE) 220 MG tablet Take 1 tablet (220 mg total) by mouth 3 (three) times daily with meals as needed (for fever or pain).  60 tablet  0  . ondansetron (ZOFRAN) 8 MG tablet Take by mouth every 8 (eight) hours as needed.      [name] oxyCODONE-acetaminophen (PERCOCET) 5-325 MG per tablet       . prochlorperazine (COMPAZINE) 10 MG tablet Take 10 mg by mouth every 6 (six) hours as needed. For nausea      . tobramycin-dexamethasone (TOBRADEX) ophthalmic solution Place 1 drop into both eyes 2 (two) times daily.  5 mL  1    Review of  Systems Review of Systems  Constitutional: Negative for fever, chills and unexpected weight change.  HENT: Negative for hearing loss, congestion, sore throat, trouble swallowing and voice change.   Eyes: Negative for visual disturbance.  Respiratory: Negative for cough and wheezing.   Cardiovascular: Negative for chest pain, palpitations and leg swelling.  Gastrointestinal: Negative for nausea, vomiting, abdominal pain, diarrhea, constipation, blood in stool, abdominal distention and anal bleeding.  Genitourinary: Negative for hematuria, vaginal bleeding and difficulty urinating.  Musculoskeletal: Positive for joint swelling and arthralgias.  Skin: Negative for rash and wound.  Neurological: Negative for seizures, syncope and headaches.  Hematological: Negative for adenopathy. Does not bruise/bleed easily.  Psychiatric/Behavioral: Negative for confusion.    Blood pressure 126/64, pulse 92, temperature 96.6 F (35.9 C), temperature source Temporal, resp. rate 18, height 5\' 5"  (1.651 m), weight 176 lb 9.6 oz (80.105 kg).  Physical Exam Physical Exam WDWN in NAD HEENT:  EOMI, sclera anicteric Neck:  No masses, no thyromegaly Lungs:  CTA bilaterally; normal respiratory effort Breasts - right side - no palpable masses Left side - healing scar in LUOQ; no palpable lymph nodes CV:  Regular rate and rhythm; no murmurs Abd:  +bowel sounds, soft, non-tender, no masses Ext:  Well-perfused; no edema Skin:  Warm, dry; no sign of jaundice  Data Reviewed MRI - abd  Assessment    Left breast cancer - stage IV s/p Left breast biopsy 03/18/2011 showing a clinical T4,N1 invasive ductal carcinoma, high grade, triple negative, with an Mib-1 of 97%, with a single liver metastasis      Plan    Left modified radical mastectomy.  The surgical procedure has been discussed with the patient.  Arm swelling and nerve damage were discussed.  Potential risks, benefits, alternative treatments, and  expected outcomes have been explained.  All of the patient's questions at this time have been answered.  The likelihood of reaching the patient's treatment goal is good.  The patient understand the proposed surgical procedure and wishes to proceed.        Deyja Sochacki K. 12/12/2011, 5:29 PM

## 2011-12-12 NOTE — Patient Instructions (Addendum)
Call [phone] to schedule your surgery.   

## 2011-12-28 ENCOUNTER — Other Ambulatory Visit (INDEPENDENT_AMBULATORY_CARE_PROVIDER_SITE_OTHER): Payer: Self-pay | Admitting: Surgery

## 2011-12-28 ENCOUNTER — Other Ambulatory Visit: Payer: Self-pay | Admitting: Oncology

## 2011-12-28 ENCOUNTER — Ambulatory Visit: Payer: Medicaid Other

## 2011-12-31 ENCOUNTER — Other Ambulatory Visit: Payer: Self-pay | Admitting: Oncology

## 2012-01-02 ENCOUNTER — Other Ambulatory Visit: Payer: Medicaid Other | Admitting: Lab

## 2012-01-02 ENCOUNTER — Telehealth: Payer: Self-pay | Admitting: Oncology

## 2012-01-02 ENCOUNTER — Ambulatory Visit: Payer: Medicaid Other | Admitting: Oncology

## 2012-01-02 NOTE — Telephone Encounter (Signed)
Pt called to cancel her appts for today due to her daughter being sick.

## 2012-01-17 NOTE — Telephone Encounter (Signed)
Old duplicate entry  

## 2012-01-26 ENCOUNTER — Other Ambulatory Visit (HOSPITAL_BASED_OUTPATIENT_CLINIC_OR_DEPARTMENT_OTHER): Payer: Medicaid Other

## 2012-01-26 ENCOUNTER — Ambulatory Visit (HOSPITAL_BASED_OUTPATIENT_CLINIC_OR_DEPARTMENT_OTHER): Payer: Medicaid Other | Admitting: Oncology

## 2012-01-26 ENCOUNTER — Telehealth: Payer: Self-pay | Admitting: *Deleted

## 2012-01-26 VITALS — BP 116/77 | HR 86 | Temp 98.0°F | Resp 20 | Ht 65.0 in | Wt 173.5 lb

## 2012-01-26 DIAGNOSIS — C50919 Malignant neoplasm of unspecified site of unspecified female breast: Secondary | ICD-10-CM

## 2012-01-26 DIAGNOSIS — C50419 Malignant neoplasm of upper-outer quadrant of unspecified female breast: Secondary | ICD-10-CM

## 2012-01-26 DIAGNOSIS — C787 Secondary malignant neoplasm of liver and intrahepatic bile duct: Secondary | ICD-10-CM

## 2012-01-26 DIAGNOSIS — D649 Anemia, unspecified: Secondary | ICD-10-CM

## 2012-01-26 LAB — CBC WITH DIFFERENTIAL/PLATELET
BASO%: 0.5 % (ref 0.0–2.0)
LYMPH%: 20.5 % (ref 14.0–49.7)
MCHC: 34.5 g/dL (ref 31.5–36.0)
MONO#: 0.3 10*3/uL (ref 0.1–0.9)
Platelets: 362 10*3/uL (ref 145–400)
RBC: 4.11 10*6/uL (ref 3.70–5.45)
RDW: 14.2 % (ref 11.2–14.5)
WBC: 4.1 10*3/uL (ref 3.9–10.3)

## 2012-01-26 LAB — COMPREHENSIVE METABOLIC PANEL (CC13)
ALT: 17 U/L (ref 0–55)
Alkaline Phosphatase: 50 U/L (ref 40–150)
CO2: 28 mEq/L (ref 22–29)
Sodium: 139 mEq/L (ref 136–145)
Total Bilirubin: 0.67 mg/dL (ref 0.20–1.20)
Total Protein: 7.6 g/dL (ref 6.4–8.3)

## 2012-01-26 MED ORDER — GABAPENTIN 300 MG PO CAPS: 300.0000 mg | ORAL_CAPSULE | Freq: Every day | ORAL | Status: DC

## 2012-01-26 NOTE — Progress Notes (Signed)
ID: Alyssa May   DOB: [birth year]  MR#: [id number]  WUJ#:[id number]  HISTORY OF PRESENT ILLNESS: Alyssa May is a 46 year-old [place] woman who first noted a small mass in her Left breast November 2011, shortly after she stopped breast-feeding. It did not disappear with resumption of her periods and grew over the next year, and particularly since she had her miscarrriage October 2012. This brought her to the Emergency Room 03/09/2011 where she was found to have a fluctuant area in the UOQ of the Left breast measuring 8.4 cm. The tissue beneath the cystic area extending towards the axilla and medially was described as firm, but not erythematous. The cystic area was lanced and a large amount of serosanguinos material issued.  The patient was referred to Dr. Marcille Blanco who saw her 03/11/2011. By this time the fluid had largely reaccumulated.[name] He incised the lesion, again draining serosanguinous liquid, packed it, and set the patient up [facility]. This showed an irregularly marginated mass measuring 7.3 cm, with abnormal axillary adenopathy. On 01/1102013 bilateral mammography confirmed a high-density mass in the Left UOQ. This was biopsied, as was a Left axillary lymph node. [facility] biopsies ([id number]) showed invasive ductal carcinoma, high grade, triple negative, with an Mib-1 of 97%. Subsequent treatments are as detailed below  INTERVAL HISTORY: Alyssa May returns today  for followup of her locally advanced left breast carcinoma. She is currently due for day 1 cycle 3 of 4 planned cycles of carbo/gemcitabine given every 3 weeks in the neoadjuvant setting.  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  Alyssa May is having a lot of pain in her joints, especially the lower extremities. Her feet, knees, hips, back, and hands are all hurting, and this is all limiting her  day-to-day activities. Her feet swell on and off. She feels stiff when she gets out of bed in the morning. Although this improves slightly throughout the day, it never resolves. She has not tried taking pain medication, and would prefer not to.  REVIEW OF SYSTEMS: Alyssa May denies any fevers or chills. She's eating well denies nausea or change in bowel habits. She currently has no significant cough, phlegm production, or increased shortness of breath. She's had no chest pain, and denies abnormal headaches or dizziness. She also denies any unusual myalgias or arthralgias, and has had no peripheral swelling. She has some residual neuropathy secondary to previous chemotherapy agents. Her energy level is low.  Otherwise a detailed review of systems is stable and noncontributory.  PAST MEDICAL HISTORY: Past Medical History  Diagnosis Date  . Breast abscess   . No pertinent past medical history   . Cancer     lt br     PAST SURGICAL HISTORY: Past Surgical History  Procedure Date  . Dilation and curettage of uterus 01/04/11  . Breast mass excision 2013  . Tonsillectomy   . Portacath placement 04/12/2011    Procedure: INSERTION PORT-A-CATH;  Surgeon: Wilmon Arms. Tsuei, [place];  Location: [facility];  Service: General;  Laterality: Right;  right subclavian  . Incision and drainage breast abscess 03/31/11    FAMILY HISTORY Family History  Problem Relation Age of Onset  . Diabetes Mother   . Hypertension Mother   the patient's father died from post-operative complications at age 50; the patient's mother is 1, lives in [place] The patient is a single child  GYNECOLOGIC HISTORY: first pregnancy to term age 46. 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 area. Her husband Alyssa May is a Hospital doctor for a Acupuncturist in [place]. Their daughter, Alyssa May, is 3. The family stays part-time with the patient's mother in [place]    ADVANCED  DIRECTIVES:  HEALTH MAINTENANCE: History  Substance Use Topics  . Smoking status: Never Smoker   . Smokeless tobacco: Never Used  . Alcohol Use: Yes     Comment: occasional     Colonoscopy:  PAP:  Bone density:  Lipid panel:  Allergies  Allergen Reactions  . Sulfa Antibiotics Swelling    Current Outpatient Prescriptions  Medication Sig Dispense Refill  . b complex vitamins tablet Take 1 tablet by mouth daily.      [name] dexamethasone (DECADRON) 4 MG tablet Take 1 tablet (4 mg total) by mouth 2 (two) times daily with a meal. As directed. Start 1 day befor and 5 days after  30 tablet  3  . lidocaine-prilocaine (EMLA) cream Apply 1 application topically as needed. To port      . loratadine (CLARITIN) 10 MG tablet Take 10 mg by mouth daily.      [name] LORazepam (ATIVAN) 0.5 MG tablet Take 0.5 mg by mouth at bedtime as needed.      . milk thistle 175 MG tablet Take 175 mg by mouth daily.      [name] morphine (MS CONTIN) 15 MG 12 hr tablet Take 1 tablet (15 mg total) by mouth 2 (two) times daily.  60 tablet  0  . Multiple Vitamin (MULTIVITAMIN) tablet Take 1 tablet by mouth daily.      . naproxen sodium (ALEVE) 220 MG tablet Take 1 tablet (220 mg total) by mouth 3 (three) times daily with meals as needed (for fever or pain).  60 tablet  0  . ondansetron (ZOFRAN) 8 MG tablet Take by mouth every 8 (eight) hours as needed.      [name] oxyCODONE-acetaminophen (PERCOCET) 5-325 MG per tablet       . prochlorperazine (COMPAZINE) 10 MG tablet Take 10 mg by mouth every 6 (six) hours as needed. For nausea      . tobramycin-dexamethasone (TOBRADEX) ophthalmic solution Place 1 drop into both eyes 2 (two) times daily.  5 mL  1    OBJECTIVE:  Middle-aged [place] American woman who appears tired, but in no acute distress Filed Vitals:   01/26/12 1052  BP: 116/77  Pulse: 86  Temp: 98 F (36.7 May)  Resp: 20     Body mass index is 28.87 kg/(m^2).    ECOG FS: 2  Filed Weights   01/26/12 1052  Weight: 173 lb 8  oz (78.699 kg)   Physical Exam: HEENT:  Sclerae anicteric.  Oropharynx clear. Nodes:  No cervical or supraclavicular lymphadenopathy palpated. Breast Exam: The improvement in the left breast is remarkable, with only a small scabbed area remaining on the lateral edge of the left breast. Lungs:  Clear to auscultation bilaterally.   Heart:  Regular rate and rhythm.   Abdomen:  Soft, nontender.  Positive bowel sounds.  Musculoskeletal:  No focal spinal tenderness to palpation.  Extremities:  Benign.  No peripheral edema or cyanosis.   Skin:  Mild hyperpigmentation in the upper extremities, including the nailbeds. No drainage or evidence of infection in the nails.   Neuro:  Nonfocal. Alert and oriented x3.   LAB RESULTS: Lab Results  Component Value Date   WBC 4.1 01/26/2012   NEUTROABS 2.8 01/26/2012   HGB 12.8 01/26/2012   HCT 36.9 01/26/2012   MCV 89.9 01/26/2012   PLT 362 01/26/2012       Chemistry      Component Value Date/Time   NA 139 11/09/2011 1129   NA 136 10/19/2011 1516   K 4.6 11/09/2011 1129   K 3.7 10/19/2011 1516   CL 105 11/09/2011 1129   CL 100 10/19/2011 1516   CO2 22 11/09/2011 1129   CO2 23 10/19/2011 1516   BUN 13.0 11/09/2011 1129   BUN 14 10/19/2011 1516   CREATININE 0.9 11/09/2011 1129   CREATININE 0.56 10/19/2011 1516      Component Value Date/Time   CALCIUM 9.4 11/09/2011 1129   CALCIUM 9.2 10/19/2011 1516   ALKPHOS 62 11/09/2011 1129   ALKPHOS 44 09/22/2011 1006   AST 19 11/09/2011 1129   AST 17 09/22/2011 1006   ALT 17 11/09/2011 1129   ALT 12 09/22/2011 1006   BILITOT 0.30 11/09/2011 1129   BILITOT 0.2* 09/22/2011 1006       Lab Results  Component Value Date   LABCA2 13 07/29/2011    STUDIES: Mr Abdomen W Wo Contrast  11/21/2011  *RADIOLOGY REPORT*  Clinical Data: Metastatic breast cancer  MRI ABDOMEN WITH AND WITHOUT CONTRAST  Technique:  Multiplanar multisequence MR imaging of the abdomen was performed both before and after administration of intravenous  contrast.  Contrast: 15mL MULTIHANCE GADOBENATE DIMEGLUMINE 529 MG/ML IV SOLN  Comparison: PET CT and CT chest dated 04/22/2011  Findings: Irregularity of the lateral left breast (series 3/image 1), incompletely visualized, likely corresponding to known primary breast cancer.  10 x 14 mm rim enhancing lesion in the lateral segment left hepatic lobe (series 1101/image 43), compatible with a hepatic metastasis, possibly minimally decreased.  Spleen, pancreas, and adrenal glands are within normal limits.  Gallbladder is unremarkable.  No intrahepatic or extrahepatic ductal dilatation.  Kidneys are within normal limits.  No hydronephrosis.  No abdominal ascites.  No suspicious abdominal lymphadenopathy.  No focal osseous lesions.  IMPRESSION: 10 x 14 mm rim enhancing lesion in the lateral segment left hepatic lobe, compatible with a hepatic metastasis, possibly minimally decreased.  Irregularity of the lateral left breast, incompletely visualized, likely corresponding to known primary breast cancer.   Original Report Authenticated By: Charline Bills, M.D.      ASSESSMENT: 46 y.o.  [place] woman with stage IV breast cancer,   (1)  s/p Left breast biopsy 03/18/2011 showing a clinical T4,N1 invasive ductal carcinoma, high grade, triple negative, with an Mib-1 of 97%, with a single liver metastasis pathologically confirmed 03/31/2011  (2) Status post 4 cycles of q. three-week docetaxel/ doxorubicin/ cyclophosphamide in the neoadjuvant setting, discontinued after 4 cycles due to to peripheral neuropathy.  Status post 2 cycles of every 3 week carboplatin/gemcitabine, for a total of 6 neoadjuvant cycles altogether.  (3)  status post hospitalization for febrile neutropenia following cycle 1 of TAC.  (4) Symptomatic anemia, s/p feraheme 08/15/2011  PLAN:  This case was reviewed with Dr. Darnelle Catalan who also spoke with the patient extensively today. He feels that it would be prudent to discontinue chemotherapy at  this time, and Alyssa May has completed a total of 6 neoadjuvant cycles of chemotherapy. The plan will be to proceed with surgery, followed by radiation therapy. He will see her in approximately 4-6 weeks for reassessment and to discuss future treatment of the liver metastases. This  might involve surgical removal which she would likely schedule through [facility].   Alyssa May is already scheduled to see Dr. Corliss Skains next week ans is ready to schedule her breast surgery.  We will see her back here in late October to review our treatment plan. Alyssa May voices understanding and agreement with this plan, and knows to call with any changes or problems.  Alyssa May    01/26/2012

## 2012-01-26 NOTE — Telephone Encounter (Signed)
Gave patient appointment in 03-2012

## 2012-01-27 ENCOUNTER — Other Ambulatory Visit: Payer: Self-pay | Admitting: Oncology

## 2012-01-27 ENCOUNTER — Telehealth: Payer: Self-pay | Admitting: *Deleted

## 2012-01-27 NOTE — Telephone Encounter (Signed)
PENDING AUTHORIZATION

## 2012-01-27 NOTE — Progress Notes (Signed)
ID: Alyssa May   DOB: 03/07/1966  MR#: [id number]  NWG#:[id number]  VHQ:[id number] Not In System GYN: SU: Manus Rudd OTHER MD:  HISTORY OF PRESENT ILLNESS: Alyssa May is a 46 year-old [place] woman who first noted a small mass in her Left breast November 2011, shortly after she stopped breast-feeding. It did not disappear with resumption of her periods and grew over the next year, and particularly since she had her miscarrriage October 2012. This brought her to the Emergency Room 03/09/2011 where she was found to have a fluctuant area in the UOQ of the Left breast measuring 8.4 cm. The tissue beneath the cystic area extending towards the axilla and medially was described as firm, but not erythematous. The cystic area was lanced and a large amount of serosanguinos material issued.  The patient was referred to Dr. Marcille Blanco who saw her 03/11/2011. By this time the fluid had largely reaccumulated.[name] He incised the lesion, again draining serosanguinous liquid, packed it, and set the patient up [facility]. This showed an irregularly marginated mass measuring 7.3 cm, with abnormal axillary adenopathy. On 01/1102013 bilateral mammography confirmed a high-density mass in the Left UOQ. This was biopsied, as was a Left axillary lymph node. [facility] biopsies ([id number]) showed invasive ductal carcinoma, high grade, triple negative, with an Mib-1 of 97%. Subsequent treatments are as detailed below  INTERVAL HISTORY: Alyssa May was seen today (01/26/2012) with her husband [name] for followup of her breast cancer. Alyssa May completed her chemotherapy 11/09/2011, and was referred back to Dr. Corliss Skains for definitive surgery. However she failed to show. She tells me she thought "the tumor was all gone". Since her last visit here she has been "detoxing", with colonics weekly x4,  ion foot baths, and infrared scans. She is considering hyperbaric oxygen therapy.  REVIEW OF SYSTEMS: She  denies fever, bleeding, or rash. She tells me her appetite is good. She has more hot flashes than before. Her periods stopped after about 4 chemotherapy cycles, but she has had some spotting since. Her peripheral neuropathy symptoms are better, although not entirely resolved. A detailed review of systems was otherwise noncontributory.  PAST MEDICAL HISTORY: Past Medical History  Diagnosis Date  . Breast abscess   . No pertinent past medical history   . Cancer     lt br     PAST SURGICAL HISTORY: Past Surgical History  Procedure Date  . Dilation and curettage of uterus 01/04/11  . Breast mass excision 2013  . Tonsillectomy   . Portacath placement 04/12/2011    Procedure: INSERTION PORT-A-CATH;  Surgeon: Wilmon Arms. Tsuei, [place];  Location: [facility];  Service: General;  Laterality: Right;  right subclavian  . Incision and drainage breast abscess 03/31/11    FAMILY HISTORY Family History  Problem Relation Age of Onset  . Diabetes Mother   . Hypertension Mother   the patient's father died from post-operative complications at age 59; the patient's mother is 66, lives in [place] The patient is a single child  GYNECOLOGIC HISTORY: first pregnancy to term age 62. 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 area. Her husband Summerlin Dimery is a Hospital doctor for a Acupuncturist in [place]. Their daughter, Alyssa May, is 3. The family stays part-time with the patient's mother in [place]    ADVANCED DIRECTIVES:  HEALTH MAINTENANCE: History  Substance Use Topics  . Smoking status: Never Smoker   .  Smokeless tobacco: Never Used  . Alcohol Use: Yes     Comment: occasional     Colonoscopy:  PAP:  Bone density:  Lipid panel:  Allergies  Allergen Reactions  . Sulfa Antibiotics Swelling    Current Outpatient Prescriptions  Medication Sig Dispense Refill  . b complex vitamins tablet Take 1 tablet by mouth daily.      [name]  gabapentin (NEURONTIN) 300 MG capsule Take 1 capsule (300 mg total) by mouth at bedtime.  30 capsule  12  . lidocaine-prilocaine (EMLA) cream Apply 1 application topically as needed. To port      . milk thistle 175 MG tablet Take 175 mg by mouth daily.        OBJECTIVE:  Middle-aged [place] American woman in no acute distress There were no vitals filed for this visit.   There is no height or weight on file to calculate BMI.    ECOG FS: 2  There were no vitals filed for this visit.  Sclerae unicteric Oropharynx clear No cervical or supraclavicular adenopathy Lungs no rales or rhonchi Heart regular rate and rhythm Abd benign MSK no focal spinal tenderness, no peripheral edema Neuro: nonfocal Breasts: The right breast is unremarkable. The left breast is considerable they improved compared to baseline. The area where she had a fungating mass now only shows a scar. I do not palpate axillary adenopathy.   LAB RESULTS: Lab Results  Component Value Date   WBC 4.1 01/26/2012   NEUTROABS 2.8 01/26/2012   HGB 12.8 01/26/2012   HCT 36.9 01/26/2012   MCV 89.9 01/26/2012   PLT 362 01/26/2012       Chemistry      Component Value Date/Time   NA 139 01/26/2012 1037   NA 136 10/19/2011 1516   K 4.1 01/26/2012 1037   K 3.7 10/19/2011 1516   CL 103 01/26/2012 1037   CL 100 10/19/2011 1516   CO2 28 01/26/2012 1037   CO2 23 10/19/2011 1516   BUN 15.0 01/26/2012 1037   BUN 14 10/19/2011 1516   CREATININE 0.9 01/26/2012 1037   CREATININE 0.56 10/19/2011 1516      Component Value Date/Time   CALCIUM 10.1 01/26/2012 1037   CALCIUM 9.2 10/19/2011 1516   ALKPHOS 50 01/26/2012 1037   ALKPHOS 44 09/22/2011 1006   AST 23 01/26/2012 1037   AST 17 09/22/2011 1006   ALT 17 01/26/2012 1037   ALT 12 09/22/2011 1006   BILITOT 0.67 01/26/2012 1037   BILITOT 0.2* 09/22/2011 1006       Lab Results  Component Value Date   LABCA2 13 07/29/2011    STUDIES: Mr Abdomen W Wo Contrast  11/21/2011   *RADIOLOGY REPORT*  Clinical Data: Metastatic breast cancer  MRI ABDOMEN WITH AND WITHOUT CONTRAST  Technique:  Multiplanar multisequence MR imaging of the abdomen was performed both before and after administration of intravenous contrast.  Contrast: 15mL MULTIHANCE GADOBENATE DIMEGLUMINE 529 MG/ML IV SOLN  Comparison: PET CT and CT chest dated 04/22/2011  Findings: Irregularity of the lateral left breast (series 3/image 1), incompletely visualized, likely corresponding to known primary breast cancer.  10 x 14 mm rim enhancing lesion in the lateral segment left hepatic lobe (series 1101/image 43), compatible with a hepatic metastasis, possibly minimally decreased.  Spleen, pancreas, and adrenal glands are within normal limits.  Gallbladder is unremarkable.  No intrahepatic or extrahepatic ductal dilatation.  Kidneys are within normal limits.  No hydronephrosis.  No abdominal ascites.  No suspicious  abdominal lymphadenopathy.  No focal osseous lesions.  IMPRESSION: 10 x 14 mm rim enhancing lesion in the lateral segment left hepatic lobe, compatible with a hepatic metastasis, possibly minimally decreased.  Irregularity of the lateral left breast, incompletely visualized, likely corresponding to known primary breast cancer.   Original Report Authenticated By: Charline Bills, M.D.      ASSESSMENT: 46 y.o.  [place] woman with stage IV breast cancer,   (1)  s/p Left breast biopsy 03/18/2011 showing a clinical T4,N1 invasive ductal carcinoma, high grade, triple negative, with an Mib-1 of 97%, with a single liver metastasis pathologically confirmed 03/31/2011  (2) Status post 4 cycles of q. three-week docetaxel/ doxorubicin/ cyclophosphamide in the neoadjuvant setting, discontinued after 4 cycles due to to peripheral neuropathy.  Status post 2 cycles of every 3 week carboplatin/ gemcitabine, for a total of 6 neoadjuvant cycles altogether, completed 11/09/2011.  PLAN:  I discussed Kailei's situation again with  her and her husband. She has had a wonderful response to chemotherapy, but there is still disease in the liver, at least, and likely also in the left breast and axilla. My recommendation is that she go ahead and optimize her local treatment now, with a left mastectomy and axillary lymph node dissection, so that we do not have to deal with the distress and morbidity of uncontrolled local recurrence later. She also needs to be restaged. If the only remaining place where we can detect disease is in the liver, I would send her to Dr. Rico Junker at [facility] for liver resection, and send that tumor to Dr. Cheree Ditto' lab for genetic evaluation, looking for targets.  Nattie agreed to restaging studies and we are also making her an appointment to return to Dr. Corliss Skains. We flushed reports today so it remains viable. She will call for her scan results, and return to see me after her surgery to discuss further treatment as detailed above.   NB: this is the correct note that goes with the 01/26/2012 visit. MAGRINAT,GUSTAV C    01/27/2012

## 2012-02-09 ENCOUNTER — Ambulatory Visit (HOSPITAL_COMMUNITY)
Admission: RE | Admit: 2012-02-09 | Discharge: 2012-02-09 | Disposition: A | Discharge status: Home / Self Care | Payer: Medicaid Other | Source: Ambulatory Visit | Attending: Oncology | Admitting: Oncology

## 2012-02-09 ENCOUNTER — Other Ambulatory Visit: Payer: Self-pay | Admitting: Oncology

## 2012-02-09 DIAGNOSIS — C50419 Malignant neoplasm of upper-outer quadrant of unspecified female breast: Secondary | ICD-10-CM

## 2012-02-09 MED ORDER — IOHEXOL 300 MG/ML  SOLN
80.0000 mL | Freq: Once | INTRAMUSCULAR | Status: AC | PRN
  Administered 2012-02-09: 80 mL via INTRAVENOUS

## 2012-02-09 MED ORDER — GADOBENATE DIMEGLUMINE 529 MG/ML IV SOLN
16.0000 mL | Freq: Once | INTRAVENOUS | Status: AC | PRN
  Administered 2012-02-09: 16 mL via INTRAVENOUS

## 2012-02-13 ENCOUNTER — Encounter (INDEPENDENT_AMBULATORY_CARE_PROVIDER_SITE_OTHER): Payer: Self-pay | Admitting: Surgery

## 2012-02-13 ENCOUNTER — Ambulatory Visit (INDEPENDENT_AMBULATORY_CARE_PROVIDER_SITE_OTHER): Payer: Medicaid Other | Admitting: Surgery

## 2012-02-13 VITALS — BP 122/80 | HR 89 | Temp 96.3°F | Ht 65.0 in | Wt 177.8 lb

## 2012-02-13 DIAGNOSIS — C50419 Malignant neoplasm of upper-outer quadrant of unspecified female breast: Secondary | ICD-10-CM

## 2012-02-13 NOTE — Progress Notes (Signed)
HPI Alyssa May is a 46 y.o. female.  Referred by Dr. Darnelle May for evaluation of left mastectomy HPI HISTORY OF PRESENT ILLNESS:   Alyssa May is a 46 year-old [place] woman who first noted a small mass in her Left breast November 2011, shortly after she stopped breast-feeding. It did not disappear with resumption of her periods and grew over the next year, and particularly since she had her miscarrriage October 2012. This brought her to the Emergency Room 03/09/2011 where she was found to have a fluctuant area in the UOQ of the Left breast measuring 8.4 cm. The tissue beneath the cystic area extending towards the axilla and medially was described as firm, but not erythematous. The cystic area was lanced and a large amount of serosanguinos material issued. I initially saw her 03/11/2011. By this time the fluid had largely reaccumulated. I incised the lesion, again draining serosanguinous liquid, packed it, and set the patient up for [facility] 03[facility]14/[facility]. This showed an irregularly marginated mass measuring 7.3 cm, with abnormal axillary adenopathy. On 01/1102013 bilateral mammography confirmed a high-density mass in the Left UOQ. This was biopsied, as was a Left axillary lymph node. Both biopsies ([id number]) showed invasive ductal carcinoma, high grade, triple negative, with an Mib-1 of 97%. She was also found to have a solitary liver metastases.   She recently stopped her chemotherapy due to significant side effects.  The plan is to proceed with surgery, followed by radiation.  She was originally supposed to schedule surgery in October, but failed to speak with our scheduling office.  She is quite reluctant to proceed with surgery, but hopefully she understands the needs for this type of procedure.  [name].       Past Medical History   Diagnosis  Date   .  Breast abscess     .  No pertinent past medical history     .   Cancer         lt br          Past Surgical History   Procedure  Date   .  Dilation and curettage of uterus  01/04/11   .  Breast mass excision  2013   .  Tonsillectomy     .  Portacath placement  04/12/2011       Procedure: INSERTION PORT-A-CATH;  Surgeon: Alyssa Arms. Mylie Mccurley, MD;  Location: [facility];  Service: General;  Laterality: Right;  right subclavian   .  Incision and drainage breast abscess  03/31/11         Family History   Problem  Relation  Age of Onset   .  Diabetes  Mother     .  Hypertension  Mother          Social History History   Substance Use Topics   .  Smoking status:  Never Smoker    .  Smokeless tobacco:  Never Used   .  Alcohol Use:  Yes         occasional         Allergies   Allergen  Reactions   .  Sulfa Antibiotics  Swelling         Current Outpatient Prescriptions   Medication  Sig  Dispense  Refill   .  b complex vitamins tablet  Take 1 tablet by mouth daily.         [name]  milk thistle 175 MG tablet  Take 175 mg by mouth daily.         .  Multiple Vitamin (MULTIVITAMIN) tablet  Take 1 tablet by mouth daily.         [name]  dexamethasone (DECADRON) 4 MG tablet  Take 1 tablet (4 mg total) by mouth 2 (two) times daily with a meal. As directed. Start 1 day befor and 5 days after   30 tablet   3   .  lidocaine-prilocaine (EMLA) cream  Apply 1 application topically as needed. To port         .  loratadine (CLARITIN) 10 MG tablet  Take 10 mg by mouth daily.         [name]  LORazepam (ATIVAN) 0.5 MG tablet  Take 0.5 mg by mouth at bedtime as needed.         [name]  morphine (MS CONTIN) 15 MG 12 hr tablet  Take 1 tablet (15 mg total) by mouth 2 (two) times daily.   60 tablet   0   .  naproxen sodium (ALEVE) 220 MG tablet  Take 1 tablet (220 mg total) by mouth 3 (three) times daily with meals as needed (for fever or pain).   60 tablet   0   .  ondansetron (ZOFRAN) 8 MG tablet  Take by mouth every 8 (eight) hours as needed.         [name]  oxyCODONE-acetaminophen  (PERCOCET) 5-325 MG per tablet           .  prochlorperazine (COMPAZINE) 10 MG tablet  Take 10 mg by mouth every 6 (six) hours as needed. For nausea         .  tobramycin-dexamethasone (TOBRADEX) ophthalmic solution  Place 1 drop into both eyes 2 (two) times daily.   5 mL   1        Review of Systems Review of Systems  Constitutional: Negative for fever, chills and unexpected weight change.  HENT: Negative for hearing loss, congestion, sore throat, trouble swallowing and voice change.   Eyes: Negative for visual disturbance.  Respiratory: Negative for cough and wheezing.   Cardiovascular: Negative for chest pain, palpitations and leg swelling.  Gastrointestinal: Negative for nausea, vomiting, abdominal pain, diarrhea, constipation, blood in stool, abdominal distention and anal bleeding.  Genitourinary: Negative for hematuria, vaginal bleeding and difficulty urinating.  Musculoskeletal: Positive for joint swelling and arthralgias.  Skin: Negative for rash and wound.  Neurological: Negative for seizures, syncope and headaches.  Hematological: Negative for adenopathy. Does not bruise/bleed easily.  Psychiatric/Behavioral: Negative for confusion.      Blood pressure 126/64, pulse 92, temperature 96.6 F (35.9 C), temperature source Temporal, resp. rate 18, height 5\' 5"  (1.651 m), weight 176 lb 9.6 oz (80.105 kg).   Physical Exam Physical Exam WDWN in NAD HEENT:  EOMI, sclera anicteric Neck:  No masses, no thyromegaly Lungs:  CTA bilaterally; normal respiratory effort Breasts - right side - no palpable masses Left side - healing scar in LUOQ; no palpable lymph nodes CV:  Regular rate and rhythm; no murmurs Abd:  +bowel sounds, soft, non-tender, no masses Ext:  Well-perfused; no edema Skin:  Warm, dry; no sign of jaundice   Data Reviewed  RADIOLOGY REPORT*  Clinical Data: Follow up of breast cancer. Diagnosed 01/13.  Chemotherapy complete. No current complaints.  CT CHEST  WITH CONTRAST   02/09/12  Technique: Multidetector CT imaging of the chest was performed  following the standard  protocol during bolus administration of  intravenous contrast.  Contrast: 80mL OMNIPAQUE IOHEXOL 300 MG/ML SOLN  Comparison: Plain film of 07/26/2011. CT of 04/22/2011 and PET of  04/21/2013.  Findings: Lungs/pleura: No nodules or airspace opacities. No  pleural fluid.  Heart/Mediastinum: Right-sided Port-A-Cath which terminates in the  right atrium.  Near complete resolution of left breast mass. Mild skin and  subcutaneous thickening about the lateral left breast remains at  the site of necrotic dominant lesion on the prior. Example image  25/series 2 today.  An adjacent left axillary node is also decreased in size, measuring  1.2 cm on image 27/series 2. 2.0 cm at the same level on the  prior. No new axillary or subpectoral adenopathy.  Normal heart size, without pericardial effusion. No central  pulmonary embolism, on this non-dedicated study. No middle  mediastinal or hilar adenopathy. Prominent thymic tissue in the  anterior mediastinum, likely due to thymic rebound. No internal  mammary adenopathy.  Upper abdomen: Decrease size and conspicuity of a subcapsular left  liver lobe hypoattenuating lesion. 1.1 x 0.9 cm today on image  54/series 2. Decreased from 1.6 x 1.4 cm at the same level on the  prior. Normal adrenal glands.  Bones/Musculoskeletal: No acute osseous abnormality.  IMPRESSION:  1. Response to therapy of left breast primary, left axillary node,  and left hepatic lobe metastasis.  2. No new or progressive disease.  3. 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  4. Right-sided Port-A-Cath terminating in the right atrium (with  the patient supine). If low SVC position is desired, consider  repeat PA and lateral radiographs to confirm position when the  patient is upright.  Original Report Authenticated By: Jeronimo Greaves, M.D.    *RADIOLOGY REPORT*  Clinical Data: Completed neoadjuvant chemotherapy for left breast  cancer. Preoperative evaluation.  BILATERAL BREAST MRI WITH AND WITHOUT CONTRAST  02/09/12  Technique: Multiplanar, multisequence MR images of both breasts  were obtained prior to and following the intravenous administration  of 16ml of MultiHance. Three dimensional images were evaluated at  the independent DynaCad workstation.  Comparison: Breast MR dated 03/28/2011, PET CT dated 04/22/2011  and abdomen MR dated 11/21/2011.  Findings: Minimal background parenchymal enhancement in both  breasts.  There is a cluster of three small nodules deep in the outer portion  of the left breast, slightly superiorly, measuring 0.8 x 0.7 x 0.7  cm in maximum dimensions. This is in the region of the previously  demonstrated 10.2 x 7.6 x 6.7 cm biopsy-proven invasive ductal  carcinoma extending into and through the skin of the breast. The  overlying skin is currently intact. This cluster of nodules  demonstrates predominantly plateau and persistent enhancement  kinetics.  There is also a 1.7 x 1.2 x 1.1 cm rounded enhancing mass with  pronounced peripheral rim enhancement in the inferior axillary  region. This is in the region of the posterior aspect of the  previously demonstrated 10.2 cm left breast mass and an adjacent  previously demonstrated 1.7 x 1.6 x 1.4 cm level I axillary lymph  node. This mass has a mixture of enhancement kinetics, including  rapid washin/washout.  The other previously demonstrated enlarged level I and level II  left axillary lymph nodes are no longer enlarged.  There is also ill-defined edema and low grade enhancement in the  soft tissues in the inferior left axilla and posterior left breast,  abutting the pectoralis muscles with continued loss of the normal  fat plane  between this area and the muscles. There is minimal  extension of this enhancing tissue into the lateral aspect of  the  pectoralis major muscle with no definite extension into the  pectoralis minor muscle. This has low grade persistent enhancement  kinetics.  No new findings suspicious for malignancy in either breast. No new  enlarged lymph nodes.  Diffuse sternal enhancement is again demonstrated, with  improvement. There was no evidence of sternal metastatic disease  on the previous PET CT.  IMPRESSION:  1. Marked reduction in size of the previously demonstrated large  biopsy-proven invasive ductal carcinoma deep in the upper outer  quadrant of the left breast with only a 0.8 x 0.7 x 0.7 cm residual  cluster of three small nodules at that location.  2. Resolved metastatic left axillary adenopathy with the exception  of a 1.7 x 1.2 x 1.1 cm residual markedly enhancing metastatic  lymph node or second focus of residual tumor in the inferior left  axilla.  3. Scar tissue deep in the posterior aspect of the upper outer  quadrant of the left breast and left inferior axilla, abutting the  pectoralis musculature with minimal extension into the lateral  aspect of the pectoralis major muscle.  RECOMMENDATION:  Treatment plan.  THREE-DIMENSIONAL MR IMAGE RENDERING ON INDEPENDENT WORKSTATION:  Three-dimensional MR images were rendered by post-processing of the  original MR data on an independent workstation. The three-  dimensional MR images were interpreted, and findings were reported  in the accompanying complete MRI report for this study.  BI-RADS CATEGORY 6: Known biopsy-proven malignancy - appropriate  action should be taken.  Original Report Authenticated By: Beckie Salts, M.D.  Assessment    Left breast cancer - stage IV s/p Left breast biopsy 03/18/2011 showing a clinical T4,N1 invasive ductal carcinoma, high grade, triple negative, with an Mib-1 of 97%, with a single liver metastasis        Plan    Left modified radical mastectomy.  The surgical procedure has been discussed with the  patient.  Arm swelling and nerve damage were discussed.  Potential risks, benefits, alternative treatments, and expected outcomes have been explained.  All of the patient's questions at this time have been answered.  The likelihood of reaching the patient's treatment goal is good.  The patient understand the proposed surgical procedure and wishes to proceed.       Alyssa Arms. Corliss Skains, MD, [facility]  02/13/2012 11:56 AM

## 2012-03-14 ENCOUNTER — Other Ambulatory Visit (INDEPENDENT_AMBULATORY_CARE_PROVIDER_SITE_OTHER): Payer: Self-pay | Admitting: Surgery

## 2012-03-15 ENCOUNTER — Other Ambulatory Visit (HOSPITAL_BASED_OUTPATIENT_CLINIC_OR_DEPARTMENT_OTHER): Payer: Medicaid Other | Admitting: Lab

## 2012-03-15 ENCOUNTER — Telehealth: Payer: Self-pay | Admitting: Oncology

## 2012-03-15 ENCOUNTER — Encounter (HOSPITAL_COMMUNITY): Payer: Self-pay | Admitting: Pharmacy Technician

## 2012-03-15 ENCOUNTER — Ambulatory Visit (HOSPITAL_BASED_OUTPATIENT_CLINIC_OR_DEPARTMENT_OTHER): Payer: Medicaid Other | Admitting: Oncology

## 2012-03-15 VITALS — BP 116/73 | HR 84 | Temp 97.7°F | Resp 20 | Ht 65.0 in | Wt 185.3 lb

## 2012-03-15 DIAGNOSIS — C50919 Malignant neoplasm of unspecified site of unspecified female breast: Secondary | ICD-10-CM

## 2012-03-15 DIAGNOSIS — C787 Secondary malignant neoplasm of liver and intrahepatic bile duct: Secondary | ICD-10-CM

## 2012-03-15 DIAGNOSIS — C50419 Malignant neoplasm of upper-outer quadrant of unspecified female breast: Secondary | ICD-10-CM

## 2012-03-15 DIAGNOSIS — N63 Unspecified lump in unspecified breast: Secondary | ICD-10-CM

## 2012-03-15 DIAGNOSIS — C773 Secondary and unspecified malignant neoplasm of axilla and upper limb lymph nodes: Secondary | ICD-10-CM

## 2012-03-15 LAB — CBC WITH DIFFERENTIAL/PLATELET
Basophils Absolute: 0 10*3/uL (ref 0.0–0.1)
EOS%: 3 % (ref 0.0–7.0)
Eosinophils Absolute: 0.1 10*3/uL (ref 0.0–0.5)
HCT: 35.9 % (ref 34.8–46.6)
HGB: 12.5 g/dL (ref 11.6–15.9)
LYMPH%: 29.1 % (ref 14.0–49.7)
MCH: 30.5 pg (ref 25.1–34.0)
MCV: 88 fL (ref 79.5–101.0)
MONO%: 8.5 % (ref 0.0–14.0)
NEUT%: 58.8 % (ref 38.4–76.8)
Platelets: 329 10*3/uL (ref 145–400)

## 2012-03-15 LAB — COMPREHENSIVE METABOLIC PANEL (CC13)
AST: 18 U/L (ref 5–34)
Alkaline Phosphatase: 60 U/L (ref 40–150)
BUN: 13 mg/dL (ref 7.0–26.0)
Calcium: 9.3 mg/dL (ref 8.4–10.4)
Creatinine: 0.9 mg/dL (ref 0.6–1.1)
Glucose: 122 mg/dl — ABNORMAL HIGH (ref 70–99)

## 2012-03-15 NOTE — Progress Notes (Signed)
ID: Alyssa May   DOB: 1965/06/08  MR#: [id number]  CSN#:[id number]  WGN:[id number] Not In System GYN: SU: Manus Rudd OTHER MD: Lonie Peak  HISTORY OF PRESENT ILLNESS: Alyssa May is a 47 year-old [place] woman who first noted a small mass in her Left breast November 2011, shortly after she stopped breast-feeding. It did not disappear with resumption of her periods and grew over the next year, and particularly since she had her miscarrriage October 2012. This brought her to the Emergency Room 03/09/2011 where she was found to have a fluctuant area in the UOQ of the Left breast measuring 8.4 cm. The tissue beneath the cystic area extending towards the axilla and medially was described as firm, but not erythematous. The cystic area was lanced and a large amount of serosanguinos material issued.  The patient was referred to Dr. Marcille Blanco who saw her 03/11/2011. By this time the fluid had largely reaccumulated.[name] He incised the lesion, again draining serosanguinous liquid, packed it, and set the patient up [facility] 03/14/2011. This showed an irregularly marginated mass measuring 7.3 cm, with abnormal axillary adenopathy. On 01/1102013 bilateral mammography confirmed a high-density mass in the Left UOQ. This was biopsied, as was a Left axillary lymph node. [place] biopsies ([id number]) showed invasive ductal carcinoma, high grade, triple negative, with an Mib-1 of 97%. Subsequent treatments are as detailed below  INTERVAL HISTORY: Alyssa May returns today for followup of her metastatic breast cancer. She tells me the holidays were "fine", and her mother came to visit. She then drove her back to [place]. Her daughter [name] is being home schooled, and participates in changing [name]'s dressing.   REVIEW OF SYSTEMS: Emagene has developed some pain in her knees and particularly the right thumb. Her left breast also hurts at times. She has some insomnia issues, and significant  hot flashes. Sometimes she has blurred vision, but most of the time her vision is fine, and she denies unusual headaches, visual changes, dizziness, gait imbalance, nausea, or vomiting. She feels forgetful but not depressed or anxious. A detailed review of systems is otherwise noncontributory.  PAST MEDICAL HISTORY: Past Medical History  Diagnosis Date  . Breast abscess   . No pertinent past medical history   . Cancer     lt br     PAST SURGICAL HISTORY: Past Surgical History  Procedure Date  . Dilation and curettage of uterus 01/04/11  . Breast mass excision 2013  . Tonsillectomy   . Portacath placement 04/12/2011    Procedure: INSERTION PORT-A-CATH;  Surgeon: Wilmon Arms. Tsuei, MD;  Location: [facility];  Service: General;  Laterality: Right;  right subclavian  . Incision and drainage breast abscess 03/31/11    FAMILY HISTORY Family History  Problem Relation Age of Onset  . Diabetes Mother   . Hypertension Mother   the patient's father died from post-operative complications at age 36; the patient's mother is 10, lives in [place] The patient is a single child  GYNECOLOGIC HISTORY: first pregnancy to term age 86. 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 Her husband Shatima Zalar is a Hospital doctor for a Acupuncturist in [place]. Their daughter, [name], is being home schooled. The family stays part-time with the patient's mother in [place]    ADVANCED DIRECTIVES:  HEALTH MAINTENANCE: History  Substance Use Topics  . Smoking status: Never Smoker   . Smokeless tobacco: Never  Used  . Alcohol Use: Yes     Comment: occasional     Colonoscopy:  PAP:  Bone density:  Lipid panel:  Allergies  Allergen Reactions  . Sulfa Antibiotics Swelling    Current Outpatient Prescriptions  Medication Sig Dispense Refill  . glucosamine-chondroitin 500-400 MG tablet Take 1 tablet by mouth daily.      . Misc Natural  Products (HEALTHY LIVER PO) Take 1 tablet by mouth 2 (two) times daily.        OBJECTIVE:  Middle-aged [place] American woman in no acute distress Filed Vitals:   03/15/12 1215  BP: 116/73  Pulse: 84  Temp: 97.7 F (36.5 C)  Resp: 20     Body mass index is 30.84 kg/(m^2).    ECOG FS: 2  Filed Weights   03/15/12 1215  Weight: 185 lb 4.8 oz (84.052 kg)    Sclerae unicteric Oropharynx clear No cervical or supraclavicular adenopathy Lungs no rales or rhonchi Heart regular rate and rhythm Abd soft, nontender, positive bowel sounds MSK no focal spinal tenderness, no peripheral edema including the left upper extremity Neuro: nonfocal, well oriented, positive affect Breasts: The right breast is unremarkable. The left breast is considerable improved compared to baseline. The area where she had a fungating mass now only shows a scar. However there is a new masses palpated within the breast, in the lateral aspect, measuring approximately 2 cm. It is movable, nontender, and not associated with erythema or other skin changes. I do not palpate any adenopathy in the left axilla.  LAB RESULTS: Lab Results  Component Value Date   WBC 4.0 03/15/2012   NEUTROABS 2.3 03/15/2012   HGB 12.5 03/15/2012   HCT 35.9 03/15/2012   MCV 88.0 03/15/2012   PLT 329 03/15/2012       Chemistry      Component Value Date/Time   NA 139 01/26/2012 1037   NA 136 10/19/2011 1516   K 4.1 01/26/2012 1037   K 3.7 10/19/2011 1516   CL 103 01/26/2012 1037   CL 100 10/19/2011 1516   CO2 28 01/26/2012 1037   CO2 23 10/19/2011 1516   BUN 15.0 01/26/2012 1037   BUN 14 10/19/2011 1516   CREATININE 0.9 01/26/2012 1037   CREATININE 0.56 10/19/2011 1516      Component Value Date/Time   CALCIUM 10.1 01/26/2012 1037   CALCIUM 9.2 10/19/2011 1516   ALKPHOS 50 01/26/2012 1037   ALKPHOS 44 09/22/2011 1006   AST 23 01/26/2012 1037   AST 17 09/22/2011 1006   ALT 17 01/26/2012 1037   ALT 12 09/22/2011 1006   BILITOT 0.67 01/26/2012  1037   BILITOT 0.2* 09/22/2011 1006       Lab Results  Component Value Date   LABCA2 13 07/29/2011    STUDIES: No results found.  ASSESSMENT: 47 y.o.  [place] woman with stage IV breast cancer,   (1)  s/p Left breast biopsy 03/18/2011 showing a clinical T4,N1 invasive ductal carcinoma, high grade, triple negative, with an Mib-1 of 97%, with a single liver metastasis pathologically confirmed 03/31/2011  (2) Status post 4 cycles of q. three-week docetaxel/ doxorubicin/ cyclophosphamide in the neoadjuvant setting, discontinued after 4 cycles due to to peripheral neuropathy.  Status post 2 cycles of every 3 week carboplatin/ gemcitabine, for a total of 6 neoadjuvant cycles altogether, completed 11/09/2011.  PLAN:  Philamena has now been off treatment for 4 months. From her point of view, she is continuing treatments because she receives liver  cleansing's and colonics with coffee enemas and acidophilus enemas through the [facility]) in [place]. Shaquella interpreted the improved results seen on her chest CT 02/09/2012 as being due to the alternative treatments, not the chemotherapy treatments.  I again went over the plan that I think would be best for [facility]. Next step would be surgery to the right breast and axilla, followed by radiation, and eventually removal of the liver lesion at [facility] with part of the tumor sent for special genetic studies available there.  Jakeia had a surgical date with Dr. Corliss Skains but when I checked the computer today I do not find that in place. It could be that she has canceled. She understands she has a new mass in her left breast and the tumor may well be growing there again. I have scheduled her for repeat liver MRI on 04/10/2011, and to see me shortly thereafter to discuss those results. She also will be seeing Dr. Basilio Cairo around that time. All this of course assumes that she does go ahead with her surgery, which is not at all clear at this  point.  Rosalinda asked me why she would need to have surgery since she had such a good response to chemotherapy. The answer was that it would be good if she lived long enough to see her daughter grow up. I am sorry to see this intelligent woman making what I feel are ultimately fatal decisions. The issue is not one of information, but of semireligious belief in the effectiveness of alternative therapies. If the MRI of the liver in February shows growth, as seems likely, we will again discuss chemotherapy and other standard treatment options.     MAGRINAT,GUSTAV C    03/15/2012

## 2012-03-15 NOTE — Telephone Encounter (Signed)
gv pt appt schedule for February and appt w/Dr. Basilio Cairo for 2/4.

## 2012-03-16 ENCOUNTER — Encounter (HOSPITAL_COMMUNITY): Payer: Self-pay

## 2012-03-16 ENCOUNTER — Encounter (HOSPITAL_COMMUNITY)
Admission: RE | Admit: 2012-03-16 | Discharge: 2012-03-16 | Disposition: A | Discharge status: Home / Self Care | Payer: Medicaid Other | Source: Ambulatory Visit | Attending: Surgery | Admitting: Surgery

## 2012-03-16 DIAGNOSIS — Z01812 Encounter for preprocedural laboratory examination: Secondary | ICD-10-CM | POA: Insufficient documentation

## 2012-03-16 HISTORY — DX: Personal history of other medical treatment: Z92.89

## 2012-03-16 HISTORY — DX: Personal history of other diseases of the respiratory system: Z87.09

## 2012-03-16 HISTORY — DX: Pain in unspecified joint: M25.50

## 2012-03-16 LAB — CBC
Hemoglobin: 13 g/dL (ref 12.0–15.0)
Platelets: 344 10*3/uL (ref 150–400)
RBC: 4.51 MIL/uL (ref 3.87–5.11)
WBC: 4.7 10*3/uL (ref 4.0–10.5)

## 2012-03-16 LAB — BASIC METABOLIC PANEL
Calcium: 9.9 mg/dL (ref 8.4–10.5)
GFR calc non Af Amer: 79 mL/min — ABNORMAL LOW (ref >90)
Potassium: 4.1 mEq/L (ref 3.5–5.1)
Sodium: 138 mEq/L (ref 135–145)

## 2012-03-16 LAB — SURGICAL PCR SCREEN
MRSA, PCR: NEGATIVE
Staphylococcus aureus: NEGATIVE

## 2012-03-16 MED ORDER — CHLORHEXIDINE GLUCONATE 4 % EX LIQD: 1.0000 "application " | Freq: Once | CUTANEOUS | Status: DC

## 2012-03-16 NOTE — Pre-Procedure Instructions (Signed)
Alyssa May  03/16/2012   Your procedure is scheduled on:  Wed, Jan 22 @ 8:30 AM  Report to [facility] at 6:30 AM.  Call this number if you have problems the morning of surgery: [phone]   Remember:   Do not eat food or drink liquids after midnight.                 No Aspirin, Fish Oil,Ibuprofen,Aleve,Goody's,or BC's 7days prior to surgery  Do not wear jewelry, make-up or nail polish.  Do not wear lotions, powders, or perfumes. You may wear deodorant.  Do not shave 48 hours prior to surgery.   Do not bring valuables to the hospital.  Contacts, dentures or bridgework may not be worn into surgery.  Leave suitcase in the car. After surgery it may be brought to your room.  For patients admitted to the hospital, checkout time is 11:00 AM the day of  discharge.   Patients discharged the day of surgery will not be allowed to drive  home.    Special Instructions: Shower using CHG 2 nights before surgery and the night before surgery.  If you shower the day of surgery use CHG.  Use special wash - you have one bottle of CHG for all showers.  You should use approximately 1/3 of the bottle for each shower.   Please read over the following fact sheets that you were given: Pain Booklet, Coughing and Deep Breathing, MRSA Information and Surgical Site Infection Prevention

## 2012-03-16 NOTE — Progress Notes (Addendum)
Pt doesn't have a cardiologist  Denies ever having an stress test/heart cath Echo in epic from 2013  Pt doesn't have a medical MD   CXR in epic from 07/26/11  Denies ekg being done within past yr

## 2012-03-20 ENCOUNTER — Telehealth: Payer: Self-pay | Admitting: *Deleted

## 2012-03-20 NOTE — Telephone Encounter (Signed)
see dr Basilio Cairo first week in feb

## 2012-03-28 ENCOUNTER — Encounter (HOSPITAL_COMMUNITY): Admission: RE | Payer: Self-pay | Source: Ambulatory Visit

## 2012-03-28 ENCOUNTER — Ambulatory Visit (HOSPITAL_COMMUNITY): Admission: RE | Admit: 2012-03-28 | Payer: Medicaid Other | Source: Ambulatory Visit | Admitting: Surgery

## 2012-03-28 SURGERY — MASTECTOMY, MODIFIED RADICAL
Anesthesia: General | Laterality: Left

## 2012-04-06 ENCOUNTER — Telehealth: Payer: Self-pay | Admitting: *Deleted

## 2012-04-06 NOTE — Telephone Encounter (Signed)
This RN returned call to [facility] per her message stating need to cancel and reschedule appointments.  Per phone conversation Alyssa May states she cancelled mastectomy and " have decided as well as not to do radiation ". " I do not want to do the scan yet and wanted to talk to Dr Darnelle Catalan because I would like a couple of months to do something different ".  Pt has already rescheduled her appointment with Dr Darnelle Catalan from 2/4 to 2/19.  Per call this RN verified pt's present plan as she will not obtain MRI on 2/3 . She verbalized that she plans on  Not keeping appointment with radiation oncology at this time. She is scheduled and plans on coming to appointment with Dr Darnelle Catalan on 04/25/2012.  This note will be given to MD. This RN informed central scheduling that pt today is declining on proceeding with MRI scheduled for 04/09/2012 but MD would need to cancel.

## 2012-04-09 ENCOUNTER — Ambulatory Visit (HOSPITAL_COMMUNITY): Payer: Medicaid Other

## 2012-04-10 ENCOUNTER — Ambulatory Visit: Payer: Medicaid Other

## 2012-04-10 ENCOUNTER — Ambulatory Visit: Payer: Medicaid Other | Admitting: Radiation Oncology

## 2012-04-11 ENCOUNTER — Other Ambulatory Visit: Payer: Self-pay | Admitting: Emergency Medicine

## 2012-04-11 ENCOUNTER — Telehealth: Payer: Self-pay | Admitting: Emergency Medicine

## 2012-04-11 NOTE — Telephone Encounter (Signed)
Patient left message stating that she would like to have MRI scheduled before her f/u with Dr Darnelle Catalan on 2/19.  Returned patient's call to confirm this and left her a message. Will reorder MRI and I instructed patient that central scheduling would set up her new MRI appointment.

## 2012-04-12 ENCOUNTER — Telehealth: Payer: Self-pay | Admitting: Oncology

## 2012-04-12 ENCOUNTER — Ambulatory Visit: Payer: Medicaid Other | Admitting: Oncology

## 2012-04-12 ENCOUNTER — Other Ambulatory Visit: Payer: Medicaid Other | Admitting: Lab

## 2012-04-12 NOTE — Telephone Encounter (Signed)
S/w the pt and she is aware of her mri appt in feb 2014.

## 2012-04-12 NOTE — Telephone Encounter (Signed)
S/W THE PT AND SHE IS AWARE OF HER MRI APPT IN FEB.

## 2012-04-18 ENCOUNTER — Ambulatory Visit (HOSPITAL_COMMUNITY): Admission: RE | Admit: 2012-04-18 | Payer: Medicaid Other | Source: Ambulatory Visit

## 2012-04-24 ENCOUNTER — Other Ambulatory Visit: Payer: Self-pay | Admitting: Oncology

## 2012-04-24 ENCOUNTER — Ambulatory Visit (HOSPITAL_COMMUNITY)
Admission: RE | Admit: 2012-04-24 | Discharge: 2012-04-24 | Disposition: A | Discharge status: Home / Self Care | Payer: Medicaid Other | Source: Ambulatory Visit | Attending: Oncology | Admitting: Oncology

## 2012-04-24 DIAGNOSIS — C787 Secondary malignant neoplasm of liver and intrahepatic bile duct: Secondary | ICD-10-CM | POA: Insufficient documentation

## 2012-04-24 DIAGNOSIS — C50419 Malignant neoplasm of upper-outer quadrant of unspecified female breast: Secondary | ICD-10-CM

## 2012-04-24 MED ORDER — GADOBENATE DIMEGLUMINE 529 MG/ML IV SOLN
17.0000 mL | Freq: Once | INTRAVENOUS | Status: AC | PRN
  Administered 2012-04-24: 17 mL via INTRAVENOUS

## 2012-04-25 ENCOUNTER — Telehealth: Payer: Self-pay | Admitting: *Deleted

## 2012-04-25 ENCOUNTER — Ambulatory Visit: Payer: Medicaid Other | Admitting: Oncology

## 2012-04-25 ENCOUNTER — Other Ambulatory Visit: Payer: Medicaid Other | Admitting: Lab

## 2012-04-25 ENCOUNTER — Other Ambulatory Visit: Payer: Self-pay | Admitting: *Deleted

## 2012-04-25 NOTE — Telephone Encounter (Signed)
Received call from patient to reschedule her appt. Confirmed appt. For 0900 on 04/27/12 with Dr. Darnelle Catalan.

## 2012-04-27 ENCOUNTER — Ambulatory Visit (HOSPITAL_BASED_OUTPATIENT_CLINIC_OR_DEPARTMENT_OTHER): Payer: Medicaid Other | Admitting: Oncology

## 2012-04-27 ENCOUNTER — Telehealth: Payer: Self-pay | Admitting: Oncology

## 2012-04-27 ENCOUNTER — Other Ambulatory Visit (HOSPITAL_BASED_OUTPATIENT_CLINIC_OR_DEPARTMENT_OTHER): Payer: Medicaid Other

## 2012-04-27 VITALS — BP 103/71 | HR 89 | Temp 98.2°F | Resp 20 | Ht 65.0 in | Wt 176.9 lb

## 2012-04-27 LAB — COMPREHENSIVE METABOLIC PANEL (CC13)
ALT: 18 U/L (ref 0–55)
Albumin: 3.5 g/dL (ref 3.5–5.0)
CO2: 28 mEq/L (ref 22–29)
Calcium: 9.8 mg/dL (ref 8.4–10.4)
Chloride: 101 mEq/L (ref 98–107)
Glucose: 102 mg/dl — ABNORMAL HIGH (ref 70–99)
Potassium: 3.8 mEq/L (ref 3.5–5.1)
Sodium: 138 mEq/L (ref 136–145)
Total Bilirubin: 0.62 mg/dL (ref 0.20–1.20)
Total Protein: 7.2 g/dL (ref 6.4–8.3)

## 2012-04-27 LAB — CBC WITH DIFFERENTIAL/PLATELET
BASO%: 0.4 % (ref 0.0–2.0)
LYMPH%: 33.6 % (ref 14.0–49.7)
MCHC: 33.7 g/dL (ref 31.5–36.0)
MONO#: 0.6 10*3/uL (ref 0.1–0.9)
Platelets: 314 10*3/uL (ref 145–400)
RBC: 4.09 10*6/uL (ref 3.70–5.45)
RDW: 13.4 % (ref 11.2–14.5)
WBC: 3 10*3/uL — ABNORMAL LOW (ref 3.9–10.3)

## 2012-04-27 LAB — CANCER ANTIGEN 27.29: CA 27.29: 12 U/mL (ref 0–39)

## 2012-04-27 NOTE — Progress Notes (Signed)
ID: Alyssa May   DOB: 10-17-1965  MR#: [id number]  WUJ#:[id number]  NFA:[id number] Not In System GYN: SU: Manus Rudd OTHER MD: Lonie Peak  HISTORY OF PRESENT ILLNESS: Sharea Guinther is a 47 year-old [place] woman who first noted a small mass in her Left breast November 2011, shortly after she stopped breast-feeding. It did not disappear with resumption of her periods and grew over the next year, and particularly since she had her miscarrriage October 2012. This brought her to the Emergency Room 03/09/2011 where she was found to have a fluctuant area in the UOQ of the Left breast measuring 8.4 cm. The tissue beneath the cystic area extending towards the axilla and medially was described as firm, but not erythematous. The cystic area was lanced and a large amount of serosanguinos material issued.  The patient was referred to Dr. Marcille Blanco who saw her 03/11/2011. By this time the fluid had largely reaccumulated.[name] He incised the lesion, again draining serosanguinous liquid, packed it, and set the patient up [facility]. This showed an irregularly marginated mass measuring 7.3 cm, with abnormal axillary adenopathy. On 01/1102013 bilateral mammography confirmed a high-density mass in the Left UOQ. This was biopsied, as was a Left axillary lymph node. [facility] biopsies ([id number]) showed invasive ductal carcinoma, high grade, triple negative, with an Mib-1 of 97%. Subsequent treatments are as detailed below  INTERVAL HISTORY: Oluwatamilore returns today for followup of her metastatic breast cancer. Since her last visit here she was supposed to have had her definitive surgery under Dr. Corliss Skains but she canceled those appointments. She wants to give alternative treatments "another chance".  REVIEW OF SYSTEMS: Regina continues to have pain at the base of her right thumb and sometimes is hard for her to right. She also has pain in her heels. There is some vaginal dryness causing a  little bit of burning not associated with urination. She has not had a menstrual period since July of 2013. She sleeps poorly, feels forgetful, and is having significant hot flashes. Her appetite is fine, in fact she is concerned about weight gain. A detailed review of systems was otherwise stable.  PAST MEDICAL HISTORY: Past Medical History  Diagnosis Date  . Breast abscess   . No pertinent past medical history   . Cancer     lt br   . History of bronchitis     last time OCt-Nov 2012  . Seizures     as a toddler but none since  . Joint pain   . History of blood transfusion     in May 2013-no abnormal reaction noted    PAST SURGICAL HISTORY: Past Surgical History  Procedure Laterality Date  . Dilation and curettage of uterus  01/04/11  . Breast mass excision  2013  . Tonsillectomy    . Portacath placement  04/12/2011    Procedure: INSERTION PORT-A-CATH;  Surgeon: Wilmon Arms. Tsuei, MD;  Location: [facility];  Service: General;  Laterality: Right;  right subclavian  . Incision and drainage breast abscess  03/31/11    FAMILY HISTORY Family History  Problem Relation Age of Onset  . Diabetes Mother   . Hypertension Mother   the patient's father died from post-operative complications at age 51; the patient's mother is 28, lives in [place] The patient is a single child  GYNECOLOGIC HISTORY: first pregnancy to term age 18. 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 Her husband Greysen Swanton is a Hospital doctor for a Acupuncturist in [place]. Their daughter, [name], is being home schooled. The family stays part-time with the patient's mother in [place]    ADVANCED DIRECTIVES: Not in place  HEALTH MAINTENANCE: History  Substance Use Topics  . Smoking status: Never Smoker   . Smokeless tobacco: Never Used  . Alcohol Use: Yes     Comment: occasionally wine     Colonoscopy:  PAP:  Bone density:  Lipid  panel:  Allergies  Allergen Reactions  . Sulfa Antibiotics Swelling    Current Outpatient Prescriptions  Medication Sig Dispense Refill  . glucosamine-chondroitin 500-400 MG tablet Take 1 tablet by mouth daily.      . Misc Natural Products (HEALTHY LIVER PO) Take 1 tablet by mouth 2 (two) times daily.       No current facility-administered medications for this visit.    OBJECTIVE:  Middle-aged [place] American woman in no acute distress Filed Vitals:   04/27/12 1051  BP: 103/71  Pulse: 89  Temp: 98.2 F (36.8 C)  Resp: 20     Body mass index is 29.44 kg/(m^2).    ECOG FS: 1  Filed Weights   04/27/12 1051  Weight: 176 lb 14.4 oz (80.241 kg)    Sclerae unicteric Oropharynx clear No cervical or supraclavicular adenopathy Lungs no rales or rhonchi Heart regular rate and rhythm Abd soft, nontender, positive bowel sounds MSK no focal spinal tenderness, no peripheral edema including the left upper extremity, no erythema or swelling in the right hand or right thumb areas Neuro: nonfocal, well oriented, positive affect Breasts: The right breast is unremarkable. In the left breast, where she had a fungating mass, there is a stellate scar measuring approximately 5 cm. Subjacent to this there is significant induration, greater than at the last visit. I do not palpate any adenopathy in the left axilla. There are no overt skin lesions  LAB RESULTS: Lab Results  Component Value Date   WBC 3.0* 04/27/2012   NEUTROABS 1.3* 04/27/2012   HGB 11.7 04/27/2012   HCT 34.8 04/27/2012   MCV 85.0 04/27/2012   PLT 314 04/27/2012       Chemistry      Component Value Date/Time   NA 138 04/27/2012 1023   NA 138 03/16/2012 1208   K 3.8 04/27/2012 1023   K 4.1 03/16/2012 1208   CL 101 04/27/2012 1023   CL 97 03/16/2012 1208   CO2 28 04/27/2012 1023   CO2 25 03/16/2012 1208   BUN 14.0 04/27/2012 1023   BUN 18 03/16/2012 1208   CREATININE 0.7 04/27/2012 1023   CREATININE 0.88 03/16/2012 1208       Component Value Date/Time   CALCIUM 9.8 04/27/2012 1023   CALCIUM 9.9 03/16/2012 1208   ALKPHOS 54 04/27/2012 1023   ALKPHOS 44 09/22/2011 1006   AST 20 04/27/2012 1023   AST 17 09/22/2011 1006   ALT 18 04/27/2012 1023   ALT 12 09/22/2011 1006   BILITOT 0.62 04/27/2012 1023   BILITOT 0.2* 09/22/2011 1006       Lab Results  Component Value Date   LABCA2 13 07/29/2011    STUDIES: Mr Liver W Wo Contrast  16-May-2012  *RADIOLOGY REPORT*  Clinical Data:  Followup metastatic disease to the liver.  MRI ABDOMEN WITHOUT AND WITH CONTRAST  Technique:  Multiplanar multisequence MR imaging of the abdomen was performed both before and after the administration of intravenous contrast.  Contrast: 17mL MULTIHANCE GADOBENATE DIMEGLUMINE 529 MG/ML IV SOLN  Comparison:  11/21/2011  Findings:  No pericardial or pleural effusion identified.  There are several lesions within the lateral aspect of the left breast. The largest measures 2.8 cm, image #1 of series 30.  These are incompletely imaged and are not included on all or today's sequences.  Left hepatic lobe lesion referenced on previous exam and now measures 8 x 7 mm, image 35/series 1101.  Previously this measured 10 x 14 mm.  No new liver lesions identified.  The gallbladder appears normal., no bile duct dilatation.  The pancreas appears normal.  Normal appearance of the spleen.  The adrenal glands are unremarkable.  Normal appearance of both kidneys.  No adenopathy within the upper abdomen.  There is no ascites.  IMPRESSION:  1.  There is been interval response to therapy with respect to the lesion in the left hepatic lobe.  No new liver lesions identified. 2.  Lesions within the lateral aspect of the left breast are noted. This may reflect resection cavity or adenopathy.  These are incompletely imaged and are not included on all or today's sequences. On several sequences these lesions appear larger than on CT from 02/09/2012.  Suggest correlation with clinical exam  and available breast imaging.  Additionally, it may be helpful to obtain a follow-up CT of the chest to assess for left axillary adenopathy.   Original Report Authenticated By: Signa Kell, M.D.     ASSESSMENT: 47 y.o.  [place] woman with stage IV breast cancer,   (1)  s/p Left breast biopsy 03/18/2011 showing a clinical T4,N1 invasive ductal carcinoma, high grade, triple negative, with an Mib-1 of 97%, with a single liver metastasis pathologically confirmed 03/31/2011  (2) Status post 4 cycles of q. three-week docetaxel/ doxorubicin/ cyclophosphamide in the neoadjuvant setting, discontinued after 4 cycles due to to peripheral neuropathy.  Status post 2 cycles of every 3 week carboplatin/ gemcitabine, for a total of 6 neoadjuvant cycles altogether, completed 11/09/2011.  PLAN:  She knows well bed the plan I suggest for her is to proceed to definitive local treatment with surgery and radiation, and then go to [facility] for removal of her liver lesion and participation in a study looking for additional targets genetically. 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 She is doing "her on thing" and would like to try that for 2 months before being reassessed.  She expressed some interest in the [facility] associated integrity of Center interim, and I gave her information regarding that. She understands that if she develops significant skin involvement in the left chest wall surgery will not be possible. At this point she agrees to repeat chest CT scan in 2 months and that has been operationalized. She knows to call for any problems that may develop before the next visit.   MAGRINAT,GUSTAV C    04/27/2012

## 2012-04-27 NOTE — Telephone Encounter (Signed)
gv pt appt for April. Pt aware central will call re ct appt. Per pt appt for 4/23 moved to 4/24. Pt cannot come 4/23.

## 2012-04-30 NOTE — Progress Notes (Signed)
No show

## 2012-06-08 ENCOUNTER — Telehealth: Payer: Self-pay | Admitting: *Deleted

## 2012-06-08 NOTE — Telephone Encounter (Signed)
Called patient regarding flush appointment for today, pt forgot about it. Will have port flushed at next MD appt 06/28/12.

## 2012-06-24 ENCOUNTER — Other Ambulatory Visit: Payer: Self-pay | Admitting: Oncology

## 2012-06-24 DIAGNOSIS — C50912 Malignant neoplasm of unspecified site of left female breast: Secondary | ICD-10-CM

## 2012-06-25 ENCOUNTER — Telehealth: Payer: Self-pay | Admitting: *Deleted

## 2012-06-25 NOTE — Telephone Encounter (Signed)
sw pt gv appt for lab @ 11am. i also gv appt for ov with [facility] for 07/06/12 @ 12noon. Pt requested that i cancel her appt for 06/28/12 due to she want to see the doctor after her scans...td

## 2012-06-27 ENCOUNTER — Other Ambulatory Visit: Payer: Medicaid Other | Admitting: Lab

## 2012-06-27 ENCOUNTER — Ambulatory Visit (HOSPITAL_COMMUNITY): Payer: Medicaid Other

## 2012-06-27 ENCOUNTER — Ambulatory Visit: Payer: Medicaid Other | Admitting: Oncology

## 2012-06-28 ENCOUNTER — Ambulatory Visit: Payer: Medicaid Other | Admitting: Oncology

## 2012-06-28 ENCOUNTER — Other Ambulatory Visit: Payer: Medicaid Other | Admitting: Lab

## 2012-07-04 ENCOUNTER — Ambulatory Visit (HOSPITAL_COMMUNITY): Admission: RE | Admit: 2012-07-04 | Payer: Medicaid Other | Source: Ambulatory Visit

## 2012-07-04 ENCOUNTER — Other Ambulatory Visit: Payer: Medicaid Other

## 2012-07-05 ENCOUNTER — Encounter (HOSPITAL_COMMUNITY): Payer: Self-pay

## 2012-07-05 ENCOUNTER — Other Ambulatory Visit (HOSPITAL_BASED_OUTPATIENT_CLINIC_OR_DEPARTMENT_OTHER): Payer: Medicaid Other | Admitting: Lab

## 2012-07-05 ENCOUNTER — Ambulatory Visit (HOSPITAL_COMMUNITY)
Admission: RE | Admit: 2012-07-05 | Discharge: 2012-07-05 | Disposition: A | Discharge status: Home / Self Care | Payer: Medicaid Other | Source: Ambulatory Visit | Attending: Oncology | Admitting: Oncology

## 2012-07-05 DIAGNOSIS — Z9010 Acquired absence of unspecified breast and nipple: Secondary | ICD-10-CM | POA: Insufficient documentation

## 2012-07-05 DIAGNOSIS — C50912 Malignant neoplasm of unspecified site of left female breast: Secondary | ICD-10-CM

## 2012-07-05 DIAGNOSIS — R234 Changes in skin texture: Secondary | ICD-10-CM | POA: Insufficient documentation

## 2012-07-05 DIAGNOSIS — R599 Enlarged lymph nodes, unspecified: Secondary | ICD-10-CM | POA: Insufficient documentation

## 2012-07-05 DIAGNOSIS — Z9221 Personal history of antineoplastic chemotherapy: Secondary | ICD-10-CM | POA: Insufficient documentation

## 2012-07-05 DIAGNOSIS — C50919 Malignant neoplasm of unspecified site of unspecified female breast: Secondary | ICD-10-CM | POA: Insufficient documentation

## 2012-07-05 LAB — CBC WITH DIFFERENTIAL/PLATELET
Basophils Absolute: 0 10*3/uL (ref 0.0–0.1)
HCT: 37.9 % (ref 34.8–46.6)
HGB: 12.7 g/dL (ref 11.6–15.9)
MONO#: 0.3 10*3/uL (ref 0.1–0.9)
NEUT%: 47.2 % (ref 38.4–76.8)
WBC: 3.5 10*3/uL — ABNORMAL LOW (ref 3.9–10.3)
lymph#: 1.4 10*3/uL (ref 0.9–3.3)

## 2012-07-05 LAB — COMPREHENSIVE METABOLIC PANEL (CC13)
BUN: 13 mg/dL (ref 7.0–26.0)
CO2: 28 mEq/L (ref 22–29)
Calcium: 8.9 mg/dL (ref 8.4–10.4)
Chloride: 103 mEq/L (ref 98–107)
Creatinine: 0.9 mg/dL (ref 0.6–1.1)
Glucose: 94 mg/dl (ref 70–99)

## 2012-07-05 MED ORDER — IOHEXOL 300 MG/ML  SOLN
80.0000 mL | Freq: Once | INTRAMUSCULAR | Status: AC | PRN
  Administered 2012-07-05: 80 mL via INTRAVENOUS

## 2012-07-06 ENCOUNTER — Other Ambulatory Visit: Payer: Self-pay | Admitting: Physician Assistant

## 2012-07-06 ENCOUNTER — Ambulatory Visit (HOSPITAL_BASED_OUTPATIENT_CLINIC_OR_DEPARTMENT_OTHER): Payer: Medicaid Other | Admitting: Oncology

## 2012-07-06 ENCOUNTER — Telehealth: Payer: Self-pay | Admitting: *Deleted

## 2012-07-06 VITALS — BP 109/78 | HR 90 | Temp 98.1°F | Resp 20 | Ht 65.0 in | Wt 181.2 lb

## 2012-07-06 DIAGNOSIS — Z171 Estrogen receptor negative status [ER-]: Secondary | ICD-10-CM

## 2012-07-06 DIAGNOSIS — B373 Candidiasis of vulva and vagina: Secondary | ICD-10-CM

## 2012-07-06 DIAGNOSIS — C787 Secondary malignant neoplasm of liver and intrahepatic bile duct: Secondary | ICD-10-CM

## 2012-07-06 DIAGNOSIS — C50419 Malignant neoplasm of upper-outer quadrant of unspecified female breast: Secondary | ICD-10-CM

## 2012-07-06 DIAGNOSIS — C50412 Malignant neoplasm of upper-outer quadrant of left female breast: Secondary | ICD-10-CM

## 2012-07-06 MED ORDER — FLUCONAZOLE 100 MG PO TABS: 100.0000 mg | ORAL_TABLET | Freq: Every day | ORAL | Status: DC

## 2012-07-06 NOTE — Progress Notes (Signed)
ID: Alyssa May   DOB: [birth year]  MR#: [id number]  JYN#:[id number]  QMV:[id number] Not In System GYN: SU: Alyssa May OTHER MD: Alyssa May  HISTORY OF PRESENT ILLNESS: Alyssa May is a 47 year-old [place] woman who first noted a small mass in her Left breast November 2011, shortly after she stopped breast-feeding. It did not disappear with resumption of her periods and grew over the next year, and particularly since she had her miscarrriage October 2012. This brought her to the Emergency Room 03/09/2011 where she was found to have a fluctuant area in the UOQ of the Left breast measuring 8.4 cm. The tissue beneath the cystic area extending towards the axilla and medially was described as firm, but not erythematous. The cystic area was lanced and a large amount of serosanguinos material issued.  The patient was referred to Dr. Marcille May who saw her 03/11/2011. By this time the fluid had largely reaccumulated.[name] He incised the lesion, again draining serosanguinous liquid, packed it, and set the patient up [facility]14[facility]. This showed an irregularly marginated mass measuring 7.3 cm, with abnormal axillary adenopathy. On 01/1102013 bilateral mammography confirmed a high-density mass in the Left UOQ. This was biopsied, as was a Left axillary lymph node. [facility] biopsies ([id number]) showed invasive ductal carcinoma, high grade, triple negative, with an Mib-1 of 97%. Subsequent treatments are as detailed below  INTERVAL HISTORY: Alyssa May returns today with her husband Alyssa May for followup of her metastatic breast cancer. She had been using "protocell" and other alternative treatments to bring her cancer under control. 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 Her cycle also has resumed.  REVIEW OF SYSTEMS: As just noted, the tingling and discomfort she had in her fingers  wrists knees etc. is now back to normal, and she "doesn't hurt anywhere". She still has slightly blurred vision secondary to accommodation but that is quite a bit better. She has vaginal itching problems and is using a variety of soaps to deal with that. However her hot flashes have resolved, just as her periods have resumed. Sometimes she has pain associated with the left breast mass, but this is mild. A detailed review of systems was otherwise negative and in particular she denies unusual headaches, visual changes, dizziness, gait imbalance, nausea, or vomiting.  PAST MEDICAL HISTORY: Past Medical History  Diagnosis Date  . Breast abscess   . No pertinent past medical history   . History of bronchitis     last time OCt-Nov 2012  . Seizures     as a toddler but none since  . Joint pain   . History of blood transfusion     in May 2013-no abnormal reaction noted  . Cancer     lt br     PAST SURGICAL HISTORY: Past Surgical History  Procedure Laterality Date  . Dilation and curettage of uterus  01/04/11  . Breast mass excision  2013  . Tonsillectomy    . Portacath placement  04/12/2011    Procedure: INSERTION PORT-A-CATH;  Surgeon: Alyssa Arms. Tsuei, [place];  Location: [facility];  Service: General;  Laterality: Right;  right subclavian  . Incision and drainage breast abscess  03/31/11    FAMILY HISTORY Family History  Problem Relation Age of Onset  . Diabetes Mother   . Hypertension Mother   the patient's father died from post-operative complications at age 83; the patient's mother is  71, lives in [place] The patient is a single child  GYNECOLOGIC HISTORY: first pregnancy to term age 62. 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 She has a good deal of family in this area. Her husband Alyssa May is a Hospital doctor for a sedan service in [place] he works there 15  days, and then returns home for another 15 days. Currently he is considering a similar job in [place]. Their daughter, Alyssa May, is being home schooled.    ADVANCED DIRECTIVES: Not in place  HEALTH MAINTENANCE: History  Substance Use Topics  . Smoking status: Never Smoker   . Smokeless tobacco: Never Used  . Alcohol Use: Yes     Comment: occasionally wine     Colonoscopy:  PAP:  Bone density:  Lipid panel:  Allergies  Allergen Reactions  . Sulfa Antibiotics Swelling    Current Outpatient Prescriptions  Medication Sig Dispense Refill  . glucosamine-chondroitin 500-400 MG tablet Take 1 tablet by mouth daily.      . Misc Natural Products (HEALTHY LIVER PO) Take 1 tablet by mouth 2 (two) times daily.       No current facility-administered medications for this visit.    OBJECTIVE:  Middle-aged [place] American woman in no acute distress Filed Vitals:   07/06/12 1225  BP: 109/78  Pulse: 90  Temp: 98.1 F (36.7 C)  Resp: 20     Body mass index is 30.15 kg/(m^2).    ECOG FS: 1  Filed Weights   07/06/12 1225  Weight: 181 lb 3.2 oz (82.192 kg)    Sclerae unicteric Oropharynx clear No cervical or supraclavicular adenopathy Lungs no rales or rhonchi Heart regular rate and rhythm Abd soft, nontender, positive bowel sounds MSK no focal spinal tenderness, no peripheral edema including the left upper extremity,  Neuro: nonfocal, well oriented, pleasant affect Breasts: The right breast is unremarkable. In the left breast, the tumor mass is now significantly greater, approximately 10 cm. It is not to the skin. There is fixed palpable left axillary adenopathy. A photograph of the left breast was obtained today.  LAB RESULTS: Lab Results  Component Value Date   WBC 3.5* 07/05/2012   NEUTROABS 1.7 07/05/2012   HGB 12.7 07/05/2012   HCT 37.9 07/05/2012   MCV 85.0 07/05/2012   PLT 337 07/05/2012       Chemistry      Component Value Date/Time   NA 139 07/05/2012 1448   NA 138  03/16/2012 1208   K 4.0 07/05/2012 1448   K 4.1 03/16/2012 1208   CL 103 07/05/2012 1448   CL 97 03/16/2012 1208   CO2 28 07/05/2012 1448   CO2 25 03/16/2012 1208   BUN 13.0 07/05/2012 1448   BUN 18 03/16/2012 1208   CREATININE 0.9 07/05/2012 1448   CREATININE 0.88 03/16/2012 1208      Component Value Date/Time   CALCIUM 8.9 07/05/2012 1448   CALCIUM 9.9 03/16/2012 1208   ALKPHOS 68 07/05/2012 1448   ALKPHOS 44 09/22/2011 1006   AST 31 07/05/2012 1448   AST 17 09/22/2011 1006   ALT 12 07/05/2012 1448   ALT 12 09/22/2011 1006   BILITOT 0.41 07/05/2012 1448   BILITOT 0.2* 09/22/2011 1006       Lab Results  Component Value Date   LABCA2 12 04/27/2012    STUDIES: Ct Chest W Contrast  07/05/2012  *RADIOLOGY  REPORT*  Clinical Data: History breast cancer in the left breast status post lumpectomy and chemotherapy.  CT CHEST WITH CONTRAST  Technique:  Multidetector CT imaging of the chest was performed following the standard protocol during bolus administration of intravenous contrast.  Contrast: 80mL OMNIPAQUE IOHEXOL 300 MG/ML  SOLN  Comparison: Chest CT 02/09/2012.  Findings:  Mediastinum: There is a small amount of amorphous soft tissue in the anterior mediastinum which has a characteristic appearance of thymic tissue, favored to represent thymic rebound following chemotherapy.  No definite to mediastinal, hilar or internal mammary lymphadenopathy identified on today's examination. A borderline enlarged left supraclavicular lymph node measuring 9 mm (image 9 of series 6) is suspicious but nonspecific.  Heart size is normal. There is no significant pericardial fluid, thickening or pericardial calcification.  Right subclavian Port-A-Cath with tip terminating the right atrium.  Esophagus is unremarkable in appearance.  Lungs/Pleura: No definite suspicious appearing pulmonary nodules or masses are identified on today's examination.  No acute consolidative air space disease.  No pleural effusions.  Upper Abdomen:  Unremarkable.  Musculoskeletal: Diffuse skin thickening in the left breast, concerning for lymphatic congestion which is often seen in the setting of inflammatory breast cancer.  Compared to the prior examination there has been a marked overall increase in size of the mass in the lateral aspect of the left breast which appears intimately associated with the lateral aspect of the left pectoralis major musculature, and now measures approximately 9.8 x 5.9 x 6.2 cm.  There are also several enlarged left axillary lymph nodes, largest of which measures up to 2.6 x 3.4 cm. There are no aggressive appearing lytic or blastic lesions noted in the visualized portions of the skeleton.  IMPRESSION: 1.  Significant progression of disease with marked enlargement of the mass in the lateral aspect of the left breast which is now intimately associated with the lateral aspect of the left pectoralis major musculature.  There is also profound skin thickening in the left breast which suggests lymphatic congestion, and can be seen in the setting of inflammatory breast cancer. Enlarged left axillary lymph nodes and borderline enlarged left supraclavicular lymph node are concerning for metastases. 2.  No definite pulmonary metastases identified at this time. 3.  Soft tissue in the anterior mediastinum is favored to represent rebounding thymic tissue following chemotherapy.   Original Report Authenticated By: Trudie Reed, M.D.     ASSESSMENT: 48 y.o.  [place] woman with stage IV breast cancer,   (1)  s/p Left breast biopsy 03/18/2011 showing a clinical T4,N1 invasive ductal carcinoma, high grade, triple negative, with an Mib-1 of 97%, with a single liver metastasis pathologically confirmed 03/31/2011  (2) Status post 4 cycles of q. three-week docetaxel/ doxorubicin/ cyclophosphamide in the neoadjuvant setting, discontinued after 4 cycles due to to peripheral neuropathy.  Status post 2 cycles of every 3 week carboplatin/  gemcitabine, for a total of 6 neoadjuvant cycles altogether, completed 11/09/2011.  PLAN:  Alyssa May is aware that her tumor is growing. We went over the images from her CT scan and she can see that there is some evidence of chest wall involvement, which would make surgery more difficult. We reviewed my recommendations again, namely to do chemotherapy, shrink the mass, and proceed to surgery. Of course she has stage IV disease (with involvement of the liver). If there were no other areas of involvement after optimizing local treatment we could consider resection and participation in [facility] tumor genetics study. I gave this recommendation to [facility]  and writing. I also offered a second opinion if she wished  Her plan is to do "juicing" and a strict diet. She had her port flushed today and she will have that flushed again when she sees me in 6 weeks. We will only do lab work and physical exam at that time. She knows to call for any problems that may develop before the next visit here.    Ancel Easler C    07/06/2012

## 2012-07-06 NOTE — Telephone Encounter (Signed)
appts made and printed...td 

## 2012-08-15 ENCOUNTER — Other Ambulatory Visit: Payer: Medicaid Other | Admitting: Lab

## 2012-08-15 ENCOUNTER — Ambulatory Visit: Payer: Medicaid Other | Admitting: Oncology

## 2012-08-16 ENCOUNTER — Telehealth: Payer: Self-pay | Admitting: Oncology

## 2012-08-16 ENCOUNTER — Telehealth: Payer: Self-pay | Admitting: *Deleted

## 2012-08-16 NOTE — Telephone Encounter (Signed)
Letter sent to patient from Dr. Magrinat. °

## 2012-08-16 NOTE — Telephone Encounter (Signed)
Pt called to r/s her ftka. gv flush appt for 08/21/12@3pm  and labs w/ ov for 09/20/12 @2 :30pm. Per pt request...td

## 2012-08-21 ENCOUNTER — Ambulatory Visit (HOSPITAL_BASED_OUTPATIENT_CLINIC_OR_DEPARTMENT_OTHER): Payer: Medicaid Other

## 2012-08-21 VITALS — BP 100/69 | HR 74 | Temp 98.2°F | Resp 16

## 2012-08-21 DIAGNOSIS — C50919 Malignant neoplasm of unspecified site of unspecified female breast: Secondary | ICD-10-CM

## 2012-08-21 MED ORDER — SODIUM CHLORIDE 0.9 % IJ SOLN
10.0000 mL | INTRAMUSCULAR | Status: DC | PRN
  Administered 2012-08-21: 10 mL via INTRAVENOUS
  Filled 2012-08-21: qty 10

## 2012-08-21 MED ORDER — HEPARIN SOD (PORK) LOCK FLUSH 100 UNIT/ML IV SOLN
500.0000 [IU] | Freq: Once | INTRAVENOUS | Status: AC
  Administered 2012-08-21: 500 [IU] via INTRAVENOUS
  Filled 2012-08-21: qty 5

## 2012-08-22 ENCOUNTER — Telehealth (HOSPITAL_COMMUNITY): Payer: Self-pay | Admitting: *Deleted

## 2012-08-22 NOTE — Telephone Encounter (Signed)
Attempted to call patient to discuss [facility] Medicaid renewal. Patient was unable to take phone call. Left message with patients spouse to call me back.

## 2012-08-27 ENCOUNTER — Encounter: Payer: Self-pay | Admitting: Oncology

## 2012-08-27 ENCOUNTER — Telehealth (HOSPITAL_COMMUNITY): Payer: Self-pay | Admitting: *Deleted

## 2012-08-27 ENCOUNTER — Telehealth: Payer: Self-pay | Admitting: *Deleted

## 2012-08-27 NOTE — Telephone Encounter (Signed)
Pt called to this RN stating need to see MD before scheduled appointment due to " my tumors are growing and causing pain,  I want to start back on chemo ".  This RN scheduled appointment per above for MD appointment- will schedule chemo post review with MD for appropriate time and hours.

## 2012-08-27 NOTE — Telephone Encounter (Signed)
Patient returned my phone call this morning stating that she has decided to proceed with chemotherapy for Dr. Darrall Dears recommendation. Told patient will get needed documents to DSS for Alyssa May Medicaid. Patient stated she is calling her Oncologist's nurse to let her know her plan after she gets off the phone. Patient verbalized understanding.

## 2012-09-03 ENCOUNTER — Other Ambulatory Visit: Payer: Medicaid Other | Admitting: Lab

## 2012-09-03 ENCOUNTER — Ambulatory Visit (HOSPITAL_BASED_OUTPATIENT_CLINIC_OR_DEPARTMENT_OTHER): Payer: Medicaid Other | Admitting: Oncology

## 2012-09-03 VITALS — BP 111/71 | HR 90 | Temp 98.1°F | Resp 20 | Ht 65.0 in | Wt 174.6 lb

## 2012-09-03 DIAGNOSIS — C50419 Malignant neoplasm of upper-outer quadrant of unspecified female breast: Secondary | ICD-10-CM

## 2012-09-03 DIAGNOSIS — C50412 Malignant neoplasm of upper-outer quadrant of left female breast: Secondary | ICD-10-CM

## 2012-09-03 MED ORDER — PROMETHAZINE HCL 25 MG RE SUPP: 25.0000 mg | Freq: Four times a day (QID) | RECTAL | Status: DC | PRN

## 2012-09-03 MED ORDER — METOCLOPRAMIDE HCL 10 MG PO TABS: 10.0000 mg | ORAL_TABLET | Freq: Three times a day (TID) | ORAL | Status: DC

## 2012-09-03 MED ORDER — ONDANSETRON HCL 8 MG PO TABS: 8.0000 mg | ORAL_TABLET | Freq: Three times a day (TID) | ORAL | Status: DC | PRN

## 2012-09-03 MED ORDER — OXYCODONE-ACETAMINOPHEN 5-325 MG PO TABS: 1.0000 | ORAL_TABLET | Freq: Three times a day (TID) | ORAL | Status: DC | PRN

## 2012-09-03 NOTE — Progress Notes (Signed)
ID: Alyssa May   DOB: 1965/05/12  MR#: [id number]  CSN#:[id number]  WUJ:[id number] Not In System GYN: SU: Alyssa May OTHER MD: Alyssa May  HISTORY OF PRESENT ILLNESS: Alyssa May is a 47 year-old [place] woman who first noted a small mass in her Left breast November 2011, shortly after she stopped breast-feeding. It did not disappear with resumption of her periods and grew over the next year, and particularly since she had her miscarrriage October 2012. This brought her to the Emergency Room 03/09/2011 where she was found to have a fluctuant area in the UOQ of the Left breast measuring 8.4 cm. The tissue beneath the cystic area extending towards the axilla and medially was described as firm, but not erythematous. The cystic area was lanced and a large amount of serosanguinos material issued.  The patient was referred to Dr. Marcille May who saw her 03/11/2011. By this time the fluid had largely reaccumulated.[name] He incised the lesion, again draining serosanguinous liquid, packed it, and set the patient up for [facility] breast [facility] 03/14/2011. This showed an irregularly marginated mass measuring 7.3 cm, with abnormal axillary adenopathy. On 01/1102013 bilateral mammography confirmed a high-density mass in the Left UOQ. This was biopsied, as was a Left axillary lymph node. [place] biopsies ([id number]) showed invasive ductal carcinoma, high grade, triple negative, with an Mib-1 of 97%. Subsequent treatments are as detailed below  INTERVAL HISTORY: Alyssa May returns today for followup of her metastatic breast cancer. Since her last visit here she had been trying a variety of alternative treatments, but she has developed significant pain in the left breast so she is here today requesting treatment.  REVIEW OF SYSTEMS: She says the pain is because the tumors have Alyssa May. She still has some Percocet from a prior prescription but those pills have actually expired. They're causing her some nausea.  She denies constipation. In addition to the pain associated with the left breast cancer, she says her hips are out of alignment. Accordingly she is having pain in the left hip area. She says this has happened before, remotely, but then it used to be the right hip that hurt. She denies headaches, visual changes, nausea, or vomiting. She had a period in March but not since. She does not believe that she is pregnant. She cannot be sure however. She denies cough, phlegm production, or pleurisy. She tells me her peripheral neuropathy has resolved. A detailed review of systems was otherwise stable.  PAST MEDICAL HISTORY: Past Medical History  Diagnosis Date  . Breast abscess   . No pertinent past medical history   . History of bronchitis     last time OCt-Nov 2012  . Seizures     as a toddler but none since  . Joint pain   . History of blood transfusion     in May 2013-no abnormal reaction noted  . Cancer     lt br     PAST SURGICAL HISTORY: Past Surgical History  Procedure Laterality Date  . Dilation and curettage of uterus  01/04/11  . Breast mass excision  2013  . Tonsillectomy    . Portacath placement  04/12/2011    Procedure: INSERTION PORT-A-CATH;  Surgeon: Wilmon Arms. Tsuei, [place];  Location: [facility];  Service: General;  Laterality: Right;  right subclavian  . Incision and drainage breast abscess  03/31/11    FAMILY HISTORY Family History  Problem Relation Age of Onset  . Diabetes Mother   . Hypertension Mother  the patient's father died from post-operative complications at age 33; the patient's mother is 35, lives in [place] The patient is a single child  GYNECOLOGIC HISTORY: first pregnancy to term age 44. 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 She has a good deal of family in this area. Her husband Alyssa May is a Hospital doctor for a sedan service  in [place] he works there 15 days, and then returns home for another 15 days. Currently he is considering a similar job in [place]. Their daughter, Alyssa May, is being home schooled.    ADVANCED DIRECTIVES: Not in place  HEALTH MAINTENANCE: History  Substance Use Topics  . Smoking status: Never Smoker   . Smokeless tobacco: Never Used  . Alcohol Use: Yes     Comment: occasionally wine     Colonoscopy:  PAP:  Bone density:  Lipid panel:  Allergies  Allergen Reactions  . Sulfa Antibiotics Swelling    Current Outpatient Prescriptions  Medication Sig Dispense Refill  . fluconazole (DIFLUCAN) 100 MG tablet Take 1 tablet (100 mg total) by mouth daily.  5 tablet  0  . glucosamine-chondroitin 500-400 MG tablet Take 1 tablet by mouth daily.      . Misc Natural Products (HEALTHY LIVER PO) Take 1 tablet by mouth 2 (two) times daily.       No current facility-administered medications for this visit.    OBJECTIVE:  Middle-aged [place] American woman in mild to moderate pain Filed Vitals:   09/03/12 1640  BP: 111/71  Pulse: 90  Temp: 98.1 F (36.7 C)  Resp: 20     Body mass index is 29.05 kg/(m^2).    ECOG FS: 1  Filed Weights   09/03/12 1640  Weight: 174 lb 9.6 oz (79.198 kg)    Sclerae unicteric Oropharynx clear No cervical or supraclavicular adenopathy Lungs no rales or rhonchi Heart regular rate and rhythm Abd soft, nontender, positive bowel sounds MSK no focal spinal tenderness, no peripheral edema including the left upper extremity,  Neuro: nonfocal, well oriented, pleasant affect Breasts: The right breast is unremarkable. In the left breast, the tumor mass takes over the majority of the breast, and now there is firmness extending into the axilla itself. I do not see separate skin nodules however There is fixed palpable left axillary adenopathy. A photograph of the left breast was obtained today.  LAB RESULTS: Lab Results  Component Value Date   WBC 3.5* 07/05/2012    NEUTROABS 1.7 07/05/2012   HGB 12.7 07/05/2012   HCT 37.9 07/05/2012   MCV 85.0 07/05/2012   PLT 337 07/05/2012       Chemistry      Component Value Date/Time   NA 139 07/05/2012 1448   NA 138 03/16/2012 1208   K 4.0 07/05/2012 1448   K 4.1 03/16/2012 1208   CL 103 07/05/2012 1448   CL 97 03/16/2012 1208   CO2 28 07/05/2012 1448   CO2 25 03/16/2012 1208   BUN 13.0 07/05/2012 1448   BUN 18 03/16/2012 1208   CREATININE 0.9 07/05/2012 1448   CREATININE 0.88 03/16/2012 1208      Component Value Date/Time   CALCIUM 8.9 07/05/2012 1448   CALCIUM 9.9 03/16/2012 1208   ALKPHOS 68 07/05/2012 1448   ALKPHOS 44 09/22/2011 1006   AST 31 07/05/2012 1448   AST 17 09/22/2011 1006   ALT 12 07/05/2012  1448   ALT 12 09/22/2011 1006   BILITOT 0.41 07/05/2012 1448   BILITOT 0.2* 09/22/2011 1006       Lab Results  Component Value Date   LABCA2 12 04/27/2012    STUDIES: Ct Chest W Contrast  07/05/2012  *RADIOLOGY REPORT*  Clinical Data: History breast cancer in the left breast status post lumpectomy and chemotherapy.  CT CHEST WITH CONTRAST  Technique:  Multidetector CT imaging of the chest was performed following the standard protocol during bolus administration of intravenous contrast.  Contrast: 80mL OMNIPAQUE IOHEXOL 300 MG/ML  SOLN  Comparison: Chest CT 02/09/2012.  Findings:  Mediastinum: There is a small amount of amorphous soft tissue in the anterior mediastinum which has a characteristic appearance of thymic tissue, favored to represent thymic rebound following chemotherapy.  No definite to mediastinal, hilar or internal mammary lymphadenopathy identified on today's examination. A borderline enlarged left supraclavicular lymph node measuring 9 mm (image 9 of series 6) is suspicious but nonspecific.  Heart size is normal. There is no significant pericardial fluid, thickening or pericardial calcification.  Right subclavian Port-A-Cath with tip terminating the right atrium.  Esophagus is unremarkable in appearance.  Lungs/Pleura:  No definite suspicious appearing pulmonary nodules or masses are identified on today's examination.  No acute consolidative air space disease.  No pleural effusions.  Upper Abdomen: Unremarkable.  Musculoskeletal: Diffuse skin thickening in the left breast, concerning for lymphatic congestion which is often seen in the setting of inflammatory breast cancer.  Compared to the prior examination there has been a marked overall increase in size of the mass in the lateral aspect of the left breast which appears intimately associated with the lateral aspect of the left pectoralis major musculature, and now measures approximately 9.8 x 5.9 x 6.2 cm.  There are also several enlarged left axillary lymph nodes, largest of which measures up to 2.6 x 3.4 cm. There are no aggressive appearing lytic or blastic lesions noted in the visualized portions of the skeleton.  IMPRESSION: 1.  Significant progression of disease with marked enlargement of the mass in the lateral aspect of the left breast which is now intimately associated with the lateral aspect of the left pectoralis major musculature.  There is also profound skin thickening in the left breast which suggests lymphatic congestion, and can be seen in the setting of inflammatory breast cancer. Enlarged left axillary lymph nodes and borderline enlarged left supraclavicular lymph node are concerning for metastases. 2.  No definite pulmonary metastases identified at this time. 3.  Soft tissue in the anterior mediastinum is favored to represent rebounding thymic tissue following chemotherapy.   Original Report Authenticated By: Trudie Reed, M.D.     ASSESSMENT: 47 y.o.  [place] woman with stage IV breast cancer,   (1)  s/p Left breast biopsy 03/18/2011 showing a clinical T4,N1 invasive ductal carcinoma, high grade, triple negative, with an Mib-1 of 97%, with a single liver metastasis pathologically confirmed 03/31/2011  (2) Status post 4 cycles of q. three-week  docetaxel/ doxorubicin/ cyclophosphamide in the neoadjuvant setting, discontinued after 4 cycles due to to peripheral neuropathy.  Status post 2 cycles of every 3 week carboplatin/ gemcitabine, for a total of 6 neoadjuvant cycles altogether, completed 11/09/2011.  PLAN:  We spent the better part of today's 45 minute visit going over her situation. At this point Alyssa May desires chemotherapy. Her immediate goal is to get rid of the pain. We discussed the fact that if we do get the tumor to shrink and the  pain goes away and she stop the chemotherapy the cancer will grow again and she will be exactly where she started. She really needs to have optimal local treatment which hopefully can include surgery and certainly should include radiation.  At this point Alyssa May is agreeable to these options, although she confesses to being scared of surgery still. The plan is going to be for labs tomorrow including a pregnancy test, then CT scan of the chest and bone scan for restaging. We are going to try weekly carboplatin and gemcitabine given weeks 1 and 2 with the third week off. We will restage after 3 cycles, but it should be fairly obvious by inspection along whether she responds to this chemotherapy.  She hated the dexamethasone she received for nausea control with her prior chemotherapy. Accordingly this time we are going to use ondansetron and metoclopramide. I wrote her those prescriptions, and I also wrote her a prescription for Phenergan suppositories. Finally I wrote her a new prescription for Percocet, 90 tablets, to take up to 3 times a day as needed for pain.  The plan is to start chemotherapy July 7. We will see her every 2 weeks or so until she completes 3 cycles and is restaged.   Julion Gatt C    09/03/2012

## 2012-09-04 ENCOUNTER — Other Ambulatory Visit (HOSPITAL_BASED_OUTPATIENT_CLINIC_OR_DEPARTMENT_OTHER): Payer: Medicaid Other | Admitting: Lab

## 2012-09-04 ENCOUNTER — Telehealth: Payer: Self-pay | Admitting: *Deleted

## 2012-09-04 DIAGNOSIS — C50419 Malignant neoplasm of upper-outer quadrant of unspecified female breast: Secondary | ICD-10-CM

## 2012-09-04 DIAGNOSIS — C50412 Malignant neoplasm of upper-outer quadrant of left female breast: Secondary | ICD-10-CM

## 2012-09-04 LAB — COMPREHENSIVE METABOLIC PANEL (CC13)
ALT: 7 U/L (ref 0–55)
Albumin: 3.3 g/dL — ABNORMAL LOW (ref 3.5–5.0)
CO2: 28 mEq/L (ref 22–29)
Calcium: 8.8 mg/dL (ref 8.4–10.4)
Chloride: 102 mEq/L (ref 98–109)
Creatinine: 0.8 mg/dL (ref 0.6–1.1)
Total Protein: 7.4 g/dL (ref 6.4–8.3)

## 2012-09-04 LAB — CBC WITH DIFFERENTIAL/PLATELET
BASO%: 0.3 % (ref 0.0–2.0)
Basophils Absolute: 0 10*3/uL (ref 0.0–0.1)
HCT: 32.2 % — ABNORMAL LOW (ref 34.8–46.6)
HGB: 11 g/dL — ABNORMAL LOW (ref 11.6–15.9)
MCHC: 34.1 g/dL (ref 31.5–36.0)
MONO#: 0.5 10*3/uL (ref 0.1–0.9)
NEUT%: 64 % (ref 38.4–76.8)
WBC: 4.9 10*3/uL (ref 3.9–10.3)
lymph#: 1.2 10*3/uL (ref 0.9–3.3)

## 2012-09-04 LAB — HCG, SERUM, QUALITATIVE: Preg, Serum: NEGATIVE

## 2012-09-04 NOTE — Telephone Encounter (Signed)
sw pt she will come in today and have labs completed @ 4pm. Pt is aware of all her other appts and will pick up a schedule after labs today. She is also aware that i am waiting on a time slot for 10/01/12. i emailed [facility] and [facility] asking for a time slot for 10/01/12 and printed the emailed and place it in their chairs...td

## 2012-09-04 NOTE — Telephone Encounter (Signed)
Per staff phone call and POF I have schedueld appts.  JMW  

## 2012-09-04 NOTE — Addendum Note (Signed)
Addended by: Billey Co on: 09/04/2012 05:41 PM   Modules accepted: Orders

## 2012-09-05 ENCOUNTER — Other Ambulatory Visit: Payer: Medicaid Other | Admitting: Lab

## 2012-09-05 ENCOUNTER — Telehealth: Payer: Self-pay | Admitting: *Deleted

## 2012-09-05 NOTE — Telephone Encounter (Signed)
sw pt gv appt d/t for 10/01/12. Labs @ 8:30am, ov @ 9am, and tx @ 9:45am. Pt is aware that i will mail a letter/avs as well....td

## 2012-09-06 ENCOUNTER — Other Ambulatory Visit: Payer: Self-pay | Admitting: Oncology

## 2012-09-10 ENCOUNTER — Encounter (HOSPITAL_COMMUNITY)
Admission: RE | Admit: 2012-09-10 | Discharge: 2012-09-10 | Disposition: A | Discharge status: Home / Self Care | Payer: Medicaid Other | Source: Ambulatory Visit | Attending: Oncology | Admitting: Oncology

## 2012-09-10 ENCOUNTER — Ambulatory Visit (HOSPITAL_BASED_OUTPATIENT_CLINIC_OR_DEPARTMENT_OTHER): Payer: Medicaid Other

## 2012-09-10 ENCOUNTER — Other Ambulatory Visit: Payer: Self-pay | Admitting: Oncology

## 2012-09-10 ENCOUNTER — Ambulatory Visit (HOSPITAL_COMMUNITY)
Admission: RE | Admit: 2012-09-10 | Discharge: 2012-09-10 | Disposition: A | Discharge status: Home / Self Care | Payer: Medicaid Other | Source: Ambulatory Visit | Attending: Oncology | Admitting: Oncology

## 2012-09-10 ENCOUNTER — Encounter (HOSPITAL_COMMUNITY): Payer: Self-pay

## 2012-09-10 VITALS — BP 111/44 | HR 76 | Temp 98.1°F

## 2012-09-10 DIAGNOSIS — C50412 Malignant neoplasm of upper-outer quadrant of left female breast: Secondary | ICD-10-CM

## 2012-09-10 DIAGNOSIS — C801 Malignant (primary) neoplasm, unspecified: Secondary | ICD-10-CM

## 2012-09-10 DIAGNOSIS — Z5111 Encounter for antineoplastic chemotherapy: Secondary | ICD-10-CM

## 2012-09-10 DIAGNOSIS — Z171 Estrogen receptor negative status [ER-]: Secondary | ICD-10-CM

## 2012-09-10 DIAGNOSIS — C50419 Malignant neoplasm of upper-outer quadrant of unspecified female breast: Secondary | ICD-10-CM | POA: Insufficient documentation

## 2012-09-10 DIAGNOSIS — N6320 Unspecified lump in the left breast, unspecified quadrant: Secondary | ICD-10-CM

## 2012-09-10 DIAGNOSIS — C787 Secondary malignant neoplasm of liver and intrahepatic bile duct: Secondary | ICD-10-CM

## 2012-09-10 DIAGNOSIS — R599 Enlarged lymph nodes, unspecified: Secondary | ICD-10-CM | POA: Insufficient documentation

## 2012-09-10 MED ORDER — LIDOCAINE-PRILOCAINE 2.5-2.5 % EX CREA: 1.0000 "application " | TOPICAL_CREAM | CUTANEOUS | Status: DC | PRN

## 2012-09-10 MED ORDER — DEXAMETHASONE SODIUM PHOSPHATE 10 MG/ML IJ SOLN
10.0000 mg | Freq: Once | INTRAMUSCULAR | Status: AC
  Administered 2012-09-10: 10 mg via INTRAVENOUS

## 2012-09-10 MED ORDER — TECHNETIUM TC 99M MEDRONATE IV KIT
25.0000 | PACK | Freq: Once | INTRAVENOUS | Status: AC | PRN
  Administered 2012-09-10: 25 via INTRAVENOUS

## 2012-09-10 MED ORDER — SODIUM CHLORIDE 0.9 % IV SOLN
272.0000 mg | Freq: Once | INTRAVENOUS | Status: AC
  Administered 2012-09-10: 270 mg via INTRAVENOUS
  Filled 2012-09-10: qty 27

## 2012-09-10 MED ORDER — SODIUM CHLORIDE 0.9 % IV SOLN
Freq: Once | INTRAVENOUS | Status: AC
  Administered 2012-09-10: 10:00:00 via INTRAVENOUS

## 2012-09-10 MED ORDER — SODIUM CHLORIDE 0.9 % IJ SOLN
10.0000 mL | INTRAMUSCULAR | Status: DC | PRN
Start: 2012-09-10 — End: 2012-09-10
  Administered 2012-09-10: 10 mL
  Filled 2012-09-10: qty 10

## 2012-09-10 MED ORDER — IOHEXOL 300 MG/ML  SOLN
80.0000 mL | Freq: Once | INTRAMUSCULAR | Status: AC | PRN
  Administered 2012-09-10: 80 mL via INTRAVENOUS

## 2012-09-10 MED ORDER — HEPARIN SOD (PORK) LOCK FLUSH 100 UNIT/ML IV SOLN
500.0000 [IU] | Freq: Once | INTRAVENOUS | Status: AC | PRN
  Administered 2012-09-10: 500 [IU]
  Filled 2012-09-10: qty 5

## 2012-09-10 MED ORDER — ONDANSETRON 8 MG/50ML IVPB (CHCC)
8.0000 mg | Freq: Once | INTRAVENOUS | Status: AC
  Administered 2012-09-10: 8 mg via INTRAVENOUS

## 2012-09-10 MED ORDER — SODIUM CHLORIDE 0.9 % IV SOLN
1000.0000 mg/m2 | Freq: Once | INTRAVENOUS | Status: AC
  Administered 2012-09-10: 1900 mg via INTRAVENOUS
  Filled 2012-09-10: qty 50

## 2012-09-10 NOTE — Patient Instructions (Addendum)
Cancer Center Discharge Instructions for Patients Receiving Chemotherapy  Today you received the following chemotherapy agents gemzar/ caroplatin  To help prevent nausea and vomiting after your treatment, we encourage you to take your nausea medication as needed   If you develop nausea and vomiting that is not controlled by your nausea medication, call the clinic.   BELOW ARE SYMPTOMS THAT SHOULD BE REPORTED IMMEDIATELY:  *FEVER GREATER THAN 100.5 F  *CHILLS WITH OR WITHOUT FEVER  NAUSEA AND VOMITING THAT IS NOT CONTROLLED WITH YOUR NAUSEA MEDICATION  *UNUSUAL SHORTNESS OF BREATH  *UNUSUAL BRUISING OR BLEEDING  TENDERNESS IN MOUTH AND THROAT WITH OR WITHOUT PRESENCE OF ULCERS  *URINARY PROBLEMS  *BOWEL PROBLEMS  UNUSUAL RASH Items with * indicate a potential emergency and should be followed up as soon as possible.  Feel free to call the clinic you have any questions or concerns. The clinic phone number is [phone].   Carboplatin injection  What is this medicine? CARBOPLATIN (KAR boe pla tin) is a chemotherapy drug. It targets fast dividing cells, like cancer cells, and causes these cells to die. This medicine is used to treat ovarian cancer and many other cancers. This medicine may be used for other purposes; ask your health care provider or pharmacist if you have questions. What should I tell my health care provider before I take this medicine? They need to know if you have any of these conditions: -blood disorders -hearing problems -kidney disease -recent or ongoing radiation therapy -an unusual or allergic reaction to carboplatin, cisplatin, other chemotherapy, other medicines, foods, dyes, or preservatives -pregnant or trying to get pregnant -breast-feeding How should I use this medicine? This drug is usually given as an infusion into a vein. It is administered in a hospital or clinic by a specially trained health care professional. Talk to  your pediatrician regarding the use of this medicine in children. Special care may be needed. Overdosage: If you think you have taken too much of this medicine contact a poison control center or emergency room at once. NOTE: This medicine is only for you. Do not share this medicine with others. What if I miss a dose? It is important not to miss a dose. Call your doctor or health care professional if you are unable to keep an appointment. What may interact with this medicine? -medicines for seizures -medicines to increase blood counts like filgrastim, pegfilgrastim, sargramostim -some antibiotics like amikacin, gentamicin, neomycin, streptomycin, tobramycin -vaccines Talk to your doctor or health care professional before taking any of these medicines: -acetaminophen -aspirin -ibuprofen -ketoprofen -naproxen This list may not describe all possible interactions. Give your health care provider a list of all the medicines, herbs, non-prescription drugs, or dietary supplements you use. Also tell them if you smoke, drink alcohol, or use illegal drugs. Some items may interact with your medicine. What should I watch for while using this medicine? Your condition will be monitored carefully while you are receiving this medicine. You will need important blood work done while you are taking this medicine. This drug may make you feel generally unwell. This is not uncommon, as chemotherapy can affect healthy cells as well as cancer cells. Report any side effects. Continue your course of treatment even though you feel ill unless your doctor tells you to stop. In some cases, you may be given additional medicines to help with side effects. Follow all directions for their use. Call your doctor or health care professional for advice if you get a fever,  chills or sore throat, or other symptoms of a cold or flu. Do not treat yourself. This drug decreases your body's ability to fight infections. Try to avoid being  around people who are sick. This medicine may increase your risk to bruise or bleed. Call your doctor or health care professional if you notice any unusual bleeding. Be careful brushing and flossing your teeth or using a toothpick because you may get an infection or bleed more easily. If you have any dental work done, tell your dentist you are receiving this medicine. Avoid taking products that contain aspirin, acetaminophen, ibuprofen, naproxen, or ketoprofen unless instructed by your doctor. These medicines may hide a fever. Do not become pregnant while taking this medicine. Women should inform their doctor if they wish to become pregnant or think they might be pregnant. There is a potential for serious side effects to an unborn child. Talk to your health care professional or pharmacist for more information. Do not breast-feed an infant while taking this medicine. What side effects may I notice from receiving this medicine? Side effects that you should report to your doctor or health care professional as soon as possible: -allergic reactions like skin rash, itching or hives, swelling of the face, lips, or tongue -signs of infection - fever or chills, cough, sore throat, pain or difficulty passing urine -signs of decreased platelets or bleeding - bruising, pinpoint red spots on the skin, black, tarry stools, nosebleeds -signs of decreased red blood cells - unusually weak or tired, fainting spells, lightheadedness -breathing problems -changes in hearing -changes in vision -chest pain -high blood pressure -low blood counts - This drug may decrease the number of white blood cells, red blood cells and platelets. You may be at increased risk for infections and bleeding. -nausea and vomiting -pain, swelling, redness or irritation at the injection site -pain, tingling, numbness in the hands or feet -problems with balance, talking, walking -trouble passing urine or change in the amount of urine Side  effects that usually do not require medical attention (report to your doctor or health care professional if they continue or are bothersome): -hair loss -loss of appetite -metallic taste in the mouth or changes in taste This list may not describe all possible side effects. Call your doctor for medical advice about side effects. You may report side effects to FDA at 1-800-FDA-1088. Where should I keep my medicine? This drug is given in a hospital or clinic and will not be stored at home. NOTE: This sheet is a summary. It may not cover all possible information. If you have questions about this medicine, talk to your doctor, pharmacist, or health care provider.  2012, Elsevier/Gold Standard. (05/29/2007 2:38:05 PM)Gemcitabine injection    GEMCITABINE (jem SIT a been) is a chemotherapy drug.   This medicine is used to treat many types of cancer like breast cancer, lung cancer, pancreatic cancer, and ovarian cancer. This medicine may be used for other purposes; ask your health care provider or pharmacist if you have questions. What should I tell my health care provider before I take this medicine? They need to know if you have any of these conditions: -blood disorders -infection -kidney disease -liver disease -recent or ongoing radiation therapy -an unusual or allergic reaction to gemcitabine, other chemotherapy, other medicines, foods, dyes, or preservatives -pregnant or trying to get pregnant -breast-feeding How should I use this medicine? This drug is given as an infusion into a vein. It is administered in a hospital or clinic by a  specially trained health care professional. Talk to your pediatrician regarding the use of this medicine in children. Special care may be needed. Overdosage: If you think you have taken too much of this medicine contact a poison control center or emergency room at once. NOTE: This medicine is only for you. Do not share this medicine with others. What if I miss  a dose? It is important not to miss your dose. Call your doctor or health care professional if you are unable to keep an appointment. What may interact with this medicine? -medicines to increase blood counts like filgrastim, pegfilgrastim, sargramostim -some other chemotherapy drugs like cisplatin -vaccines Talk to your doctor or health care professional before taking any of these medicines: -acetaminophen -aspirin -ibuprofen -ketoprofen -naproxen This list may not describe all possible interactions. Give your health care provider a list of all the medicines, herbs, non-prescription drugs, or dietary supplements you use. Also tell them if you smoke, drink alcohol, or use illegal drugs. Some items may interact with your medicine. What should I watch for while using this medicine? Visit your doctor for checks on your progress. This drug may make you feel generally unwell. This is not uncommon, as chemotherapy can affect healthy cells as well as cancer cells. Report any side effects. Continue your course of treatment even though you feel ill unless your doctor tells you to stop. In some cases, you may be given additional medicines to help with side effects. Follow all directions for their use. Call your doctor or health care professional for advice if you get a fever, chills or sore throat, or other symptoms of a cold or flu. Do not treat yourself. This drug decreases your body's ability to fight infections. Try to avoid being around people who are sick. This medicine may increase your risk to bruise or bleed. Call your doctor or health care professional if you notice any unusual bleeding. Be careful brushing and flossing your teeth or using a toothpick because you may get an infection or bleed more easily. If you have any dental work done, tell your dentist you are receiving this medicine. Avoid taking products that contain aspirin, acetaminophen, ibuprofen, naproxen, or ketoprofen unless instructed  by your doctor. These medicines may hide a fever. Women should inform their doctor if they wish to become pregnant or think they might be pregnant. There is a potential for serious side effects to an unborn child. Talk to your health care professional or pharmacist for more information. Do not breast-feed an infant while taking this medicine. What side effects may I notice from receiving this medicine? Side effects that you should report to your doctor or health care professional as soon as possible: -allergic reactions like skin rash, itching or hives, swelling of the face, lips, or tongue -low blood counts - this medicine may decrease the number of white blood cells, red blood cells and platelets. You may be at increased risk for infections and bleeding. -signs of infection - fever or chills, cough, sore throat, pain or difficulty passing urine -signs of decreased platelets or bleeding - bruising, pinpoint red spots on the skin, black, tarry stools, blood in the urine -signs of decreased red blood cells - unusually weak or tired, fainting spells, lightheadedness -breathing problems -chest pain -mouth sores -nausea and vomiting -pain, swelling, redness at site where injected -pain, tingling, numbness in the hands or feet -stomach pain -swelling of ankles, feet, hands -unusual bleeding Side effects that usually do not require medical attention (report to  your doctor or health care professional if they continue or are bothersome): -constipation -diarrhea -hair loss -loss of appetite -stomach upset This list may not describe all possible side effects. Call your doctor for medical advice about side effects. You may report side effects to FDA at 1-800-FDA-1088. Where should I keep my medicine? This drug is given in a hospital or clinic and will not be stored at home. NOTE: This sheet is a summary. It may not cover all possible information. If you have questions about this medicine, talk to  your doctor, pharmacist, or health care provider.  2012, Elsevier/Gold Standard. (07/03/2007 6:45:54 PM)

## 2012-09-11 ENCOUNTER — Telehealth: Payer: Self-pay | Admitting: *Deleted

## 2012-09-11 ENCOUNTER — Other Ambulatory Visit: Payer: Self-pay | Admitting: *Deleted

## 2012-09-11 MED ORDER — AMOXICILLIN 500 MG PO CAPS: ORAL_CAPSULE | ORAL | Status: DC

## 2012-09-11 NOTE — Telephone Encounter (Signed)
Ms. Alyssa May says sh is doing well.  Denies any side effects or changes at this time.  Has been resting in bed most of the day.  Slept well yesterday and last night.  Encouraged to drink 64 oz water and fluids daily.  Denies any bowel or bladder problems. Asked how many treatments she is to receive.  Explained she will receive four and then be scanned to determine the response to treatment to determine how to proceed.  No further questions.

## 2012-09-11 NOTE — Telephone Encounter (Signed)
Message copied by Augusto Garbe on Tue Sep 11, 2012  4:52 PM ------      Message from: Caren Griffins      Created: Mon Sep 10, 2012 10:49 AM      Regarding: chemo f/u       1st time gemzar, carboplatin, dr Darnelle Catalan  [phone] Judie Petit)       ------

## 2012-09-13 ENCOUNTER — Encounter: Payer: Self-pay | Admitting: Oncology

## 2012-09-13 ENCOUNTER — Other Ambulatory Visit: Payer: Self-pay | Admitting: Oncology

## 2012-09-17 ENCOUNTER — Other Ambulatory Visit (HOSPITAL_BASED_OUTPATIENT_CLINIC_OR_DEPARTMENT_OTHER): Payer: Medicaid Other | Admitting: Lab

## 2012-09-17 ENCOUNTER — Telehealth: Payer: Self-pay | Admitting: *Deleted

## 2012-09-17 ENCOUNTER — Ambulatory Visit (HOSPITAL_BASED_OUTPATIENT_CLINIC_OR_DEPARTMENT_OTHER): Payer: Medicaid Other

## 2012-09-17 ENCOUNTER — Ambulatory Visit (HOSPITAL_BASED_OUTPATIENT_CLINIC_OR_DEPARTMENT_OTHER): Payer: Medicaid Other | Admitting: Oncology

## 2012-09-17 VITALS — BP 129/82 | HR 70 | Temp 98.5°F | Resp 20 | Ht 65.0 in | Wt 168.9 lb

## 2012-09-17 DIAGNOSIS — C50419 Malignant neoplasm of upper-outer quadrant of unspecified female breast: Secondary | ICD-10-CM

## 2012-09-17 DIAGNOSIS — C787 Secondary malignant neoplasm of liver and intrahepatic bile duct: Secondary | ICD-10-CM

## 2012-09-17 DIAGNOSIS — Z171 Estrogen receptor negative status [ER-]: Secondary | ICD-10-CM

## 2012-09-17 DIAGNOSIS — C50412 Malignant neoplasm of upper-outer quadrant of left female breast: Secondary | ICD-10-CM

## 2012-09-17 DIAGNOSIS — Z5111 Encounter for antineoplastic chemotherapy: Secondary | ICD-10-CM

## 2012-09-17 LAB — COMPREHENSIVE METABOLIC PANEL (CC13)
AST: 21 U/L (ref 5–34)
Albumin: 3.7 g/dL (ref 3.5–5.0)
Alkaline Phosphatase: 69 U/L (ref 40–150)
Potassium: 3.7 mEq/L (ref 3.5–5.1)
Sodium: 138 mEq/L (ref 136–145)
Total Protein: 8 g/dL (ref 6.4–8.3)

## 2012-09-17 LAB — CBC WITH DIFFERENTIAL/PLATELET
BASO%: 0.5 % (ref 0.0–2.0)
EOS%: 1.4 % (ref 0.0–7.0)
HGB: 11.3 g/dL — ABNORMAL LOW (ref 11.6–15.9)
MCH: 28.4 pg (ref 25.1–34.0)
MCHC: 33.6 g/dL (ref 31.5–36.0)
RBC: 3.98 10*6/uL (ref 3.70–5.45)
RDW: 13.3 % (ref 11.2–14.5)
lymph#: 1.4 10*3/uL (ref 0.9–3.3)
nRBC: 0 % (ref 0–0)

## 2012-09-17 MED ORDER — SODIUM CHLORIDE 0.9 % IV SOLN
270.0000 mg | Freq: Once | INTRAVENOUS | Status: AC
  Administered 2012-09-17: 270 mg via INTRAVENOUS
  Filled 2012-09-17: qty 27

## 2012-09-17 MED ORDER — HEPARIN SOD (PORK) LOCK FLUSH 100 UNIT/ML IV SOLN
500.0000 [IU] | Freq: Once | INTRAVENOUS | Status: AC | PRN
  Administered 2012-09-17: 500 [IU]
  Filled 2012-09-17: qty 5

## 2012-09-17 MED ORDER — SODIUM CHLORIDE 0.9 % IV SOLN
Freq: Once | INTRAVENOUS | Status: AC
  Administered 2012-09-17: 14:00:00 via INTRAVENOUS

## 2012-09-17 MED ORDER — ONDANSETRON 8 MG/50ML IVPB (CHCC)
8.0000 mg | Freq: Once | INTRAVENOUS | Status: AC
  Administered 2012-09-17: 8 mg via INTRAVENOUS

## 2012-09-17 MED ORDER — SODIUM CHLORIDE 0.9 % IJ SOLN
10.0000 mL | INTRAMUSCULAR | Status: DC | PRN
  Administered 2012-09-17: 10 mL
  Filled 2012-09-17: qty 10

## 2012-09-17 MED ORDER — DEXAMETHASONE SODIUM PHOSPHATE 10 MG/ML IJ SOLN
10.0000 mg | Freq: Once | INTRAMUSCULAR | Status: AC
  Administered 2012-09-17: 10 mg via INTRAVENOUS

## 2012-09-17 MED ORDER — SODIUM CHLORIDE 0.9 % IV SOLN
1000.0000 mg/m2 | Freq: Once | INTRAVENOUS | Status: AC
  Administered 2012-09-17: 1900 mg via INTRAVENOUS
  Filled 2012-09-17: qty 50

## 2012-09-17 NOTE — Progress Notes (Signed)
OK to treat with lab values of 7/14 per Dr. Darnelle Catalan.

## 2012-09-17 NOTE — Telephone Encounter (Signed)
appts made and printed...td 

## 2012-09-17 NOTE — Patient Instructions (Addendum)
[facility] Discharge Instructions for Patients Receiving Chemotherapy  Today you received the following chemotherapy agents: gemzar, carboplatin  To help prevent nausea and vomiting after your treatment, we encourage you to take your nausea medication.  Take it as often as prescribed.     If you develop nausea and vomiting that is not controlled by your nausea medication, call the clinic. If it is after clinic hours your family physician or the after hours number for the clinic or go to the Emergency Department.   BELOW ARE SYMPTOMS THAT SHOULD BE REPORTED IMMEDIATELY:  *FEVER GREATER THAN 100.5 F  *CHILLS WITH OR WITHOUT FEVER  NAUSEA AND VOMITING THAT IS NOT CONTROLLED WITH YOUR NAUSEA MEDICATION  *UNUSUAL SHORTNESS OF BREATH  *UNUSUAL BRUISING OR BLEEDING  TENDERNESS IN MOUTH AND THROAT WITH OR WITHOUT PRESENCE OF ULCERS  *URINARY PROBLEMS  *BOWEL PROBLEMS  UNUSUAL RASH Items with * indicate a potential emergency and should be followed up as soon as possible.  Feel free to call the clinic you have any questions or concerns. The clinic phone number is [phone].   I have been informed and understand all the instructions given to me. I know to contact the clinic, my physician, or go to the Emergency Department if any problems should occur. I do not have any questions at this time, but understand that I may call the clinic during office hours   should I have any questions or need assistance in obtaining follow up care.    __________________________________________  _____________  __________ Signature of Patient or Authorized Representative            Date                   Time    __________________________________________ Nurse's Signature     Neutropenia Neutropenia is a condition that occurs when the level of a certain type of white blood cell (neutrophil) in your body becomes lower than normal. Neutrophils are made in the bone marrow and fight  infections. These cells protect against bacteria and viruses. The fewer neutrophils you have, and the longer your body remains without them, the greater your risk of getting a severe infection becomes. CAUSES  The cause of neutropenia may be hard to determine. However, it is usually due to 3 main problems:   Decreased production of neutrophils. This may be due to:  Certain medicines such as chemotherapy.  Genetic problems.  Cancer.  Radiation treatments.  Vitamin deficiency.  Some pesticides.  Increased destruction of neutrophils. This may be due to:  Overwhelming infections.  Hemolytic anemia. This is when the body destroys its own blood cells.  Chemotherapy.  Neutrophils moving to areas of the body where they cannot fight infections. This may be due to:  Dialysis procedures.  Conditions where the spleen becomes enlarged. Neutrophils are held in the spleen and are not available to the rest of the body.  Overwhelming infections. The neutrophils are held in the area of the infection and are not available to the rest of the body. SYMPTOMS  There are no specific symptoms of neutropenia. The lack of neutrophils can result in an infection, and an infection can cause various problems. DIAGNOSIS  Diagnosis is made by a blood test. A complete blood count is performed. The normal level of neutrophils in human blood differs with age and race. Infants have lower counts than older children and adults. African Americans have lower counts than Caucasians or Asians. The average adult  level is 1500 cells/mm3 of blood. Neutrophil counts are interpreted as follows:  Greater than 1000 cells/mm3 gives normal protection against infection.  500 to 1000 cells/mm3 gives an increased risk for infection.  200 to 500 cells/mm3 is a greater risk for severe infection.  Lower than 200 cells/mm3 is a marked risk of infection. This may require hospitalization and treatment with antibiotic  medicines. TREATMENT  Treatment depends on the underlying cause, severity, and presence of infections or symptoms. It also depends on your health. Your caregiver will discuss the treatment plan with you. Mild cases are often easily treated and have a good outcome. Preventative measures may also be started to limit your risk of infections. Treatment can include:  Taking antibiotics.  Stopping medicines that are known to cause neutropenia.  Correcting nutritional deficiencies by eating green vegetables to supply folic acid and taking vitamin B supplements.  Stopping exposure to pesticides if your neutropenia is related to pesticide exposure.  Taking a blood growth factor called sargramostim, pegfilgrastim, or filgrastim if you are undergoing chemotherapy for cancer. This stimulates white blood cell production.  Removal of the spleen if you have Felty's syndrome and have repeated infections. HOME CARE INSTRUCTIONS   Follow your caregiver's instructions about when you need to have blood work done.  Wash your hands often. Make sure others who come in contact with you also wash their hands.  Wash raw fruits and vegetables before eating them. They can carry bacteria and fungi.  Avoid people with colds or spreadable (contagious) diseases (chickenpox, herpes zoster, influenza).  Avoid large crowds.  Avoid construction areas. The dust can release fungus into the air.  Be cautious around children in daycare or school environments.  Take care of your respiratory system by coughing and deep breathing.  Bathe daily.  Protect your skin from cuts and burns.  Do not work in the garden or with flowers and plants.  Care for the mouth before and after meals by brushing with a soft toothbrush. If you have mucositis, do not use mouthwash. Mouthwash contains alcohol and can dry out the mouth even more.  Clean the area between the genitals and the anus (perineal area) after urination and bowel  movements. Women need to wipe from front to back.  Use a water soluble lubricant during sexual intercourse and practice good hygiene after. Do not have intercourse if you are severely neutropenic. Check with your caregiver for guidelines.  Exercise daily as tolerated.  Avoid people who were vaccinated with a live vaccine in the past 30 days. You should not receive live vaccines (polio, typhoid).  Do not provide direct care for pets. Avoid animal droppings. Do not clean litter boxes and bird cages.  Do not share food utensils.  Do not use tampons, enemas, or rectal suppositories unless directed by your caregiver.  Use an electric razor to remove hair.  Wash your hands after handling magazines, letters, and newspapers. SEEK IMMEDIATE MEDICAL CARE IF:   You have a fever.  You have chills or start to shake.  You feel nauseous or vomit.  You develop mouth sores.  You develop aches and pains.  You have redness and swelling around open wounds.  Your skin is warm to the touch.  You have pus coming from your wounds.  You develop swollen lymph nodes.  You feel weak or fatigued.  You develop red streaks on the skin. MAKE SURE YOU:  Understand these instructions.  Will watch your condition.  Will get help right away  if you are not doing well or get worse. Document Released: 08/13/2001 Document Revised: 05/16/2011 Document Reviewed: 09/10/2010 [facility] Patient Information 2014 [place], [place].

## 2012-09-17 NOTE — Progress Notes (Signed)
ID: Alyssa May   DOB: 19-Jun-1965  MR#: [id number]  YQM#:[id number]  BMW:[id number] Not In System GYN: SU: Manus Rudd OTHER MD: Lonie Peak  HISTORY OF PRESENT ILLNESS: Alyssa May is a 47 year-old [place] woman who first noted a small mass in her Left breast November 2011, shortly after she stopped breast-feeding. It did not disappear with resumption of her periods and grew over the next year, and particularly since she had her miscarrriage October 2012. This brought her to the Emergency Room 03/09/2011 where she was found to have a fluctuant area in the UOQ of the Left breast measuring 8.4 cm. The tissue beneath the cystic area extending towards the axilla and medially was described as firm, but not erythematous. The cystic area was lanced and a large amount of serosanguinos material issued.  The patient was referred to Dr. Marcille Blanco who saw her 03/11/2011. By this time the fluid had largely reaccumulated.[name] He incised the lesion, again draining serosanguinous liquid, packed it, and set the patient up for [facility] breast [facility] 03[facility]14/2011. This showed an irregularly marginated mass measuring 7.3 cm, with abnormal axillary adenopathy. On 01/1102013 bilateral mammography confirmed a high-density mass in the Left UOQ. This was biopsied, as was a Left axillary lymph node. [place] biopsies ([id number]) showed invasive ductal carcinoma, high grade, triple negative, with an Mib-1 of 97%. Subsequent treatments are as detailed below  INTERVAL HISTORY: Alyssa May returns today for followup of her metastatic breast cancer. This is day 8 cycle 1 of her carboplatin/ gemcitabine treatment.   REVIEW OF SYSTEMS: She feels the cancer is "going down some" already. She tolerated the treatment well, although she had nausea and vomited x1. She did not take a nausea medicine as prescribed but rather as needed. She still has pain in the left breast. That is well-controlled on her current medications. She has  pain in the left hip as well. (We just did a bone scan which was negative for bone involvement). Otherwise a detailed review of systems today was noncontributory  PAST MEDICAL HISTORY: Past Medical History  Diagnosis Date  . Breast abscess   . No pertinent past medical history   . History of bronchitis     last time OCt-Nov 2012  . Seizures     as a toddler but none since  . Joint pain   . History of blood transfusion     in May 2013-no abnormal reaction noted  . Cancer     lt br     PAST SURGICAL HISTORY: Past Surgical History  Procedure Laterality Date  . Dilation and curettage of uterus  01/04/11  . Breast mass excision  2013  . Tonsillectomy    . Portacath placement  04/12/2011    Procedure: INSERTION PORT-A-CATH;  Surgeon: Wilmon Arms. Tsuei, [place];  Location: [facility];  Service: General;  Laterality: Right;  right subclavian  . Incision and drainage breast abscess  03/31/11    FAMILY HISTORY Family History  Problem Relation Age of Onset  . Diabetes Mother   . Hypertension Mother   the patient's father died from post-operative complications at age 39; the patient's mother is 69, lives in [place] The patient is a single child  GYNECOLOGIC HISTORY: first pregnancy to term age 51. 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 She has a good deal of family in this area. Her  husband Abir Eroh is a Hospital doctor for a [employer]up[employer]ist in [place] he works there 15 days, and then returns home for another 15 days. Currently he is considering a similar job in [place]. Their daughter, Alyssa May, is being home schooled.    ADVANCED DIRECTIVES: Not in place  HEALTH MAINTENANCE: History  Substance Use Topics  . Smoking status: Never Smoker   . Smokeless tobacco: Never Used  . Alcohol Use: Yes     Comment: occasionally wine     Colonoscopy:  PAP:  Bone  density:  Lipid panel:  Allergies  Allergen Reactions  . Sulfa Antibiotics Swelling    Current Outpatient Prescriptions  Medication Sig Dispense Refill  . amoxicillin (AMOXIL) 500 MG capsule Standing order :  Pt to take 500mg  tab am of dental procedure x 1 dose  1 capsule  3  . lidocaine-prilocaine (EMLA) cream Apply 1 application topically as needed. To port  30 g  1  . metoCLOPramide (REGLAN) 10 MG tablet Take 1 tablet (10 mg total) by mouth 4 (four) times daily -  before meals and at bedtime.  120 tablet  6  . ondansetron (ZOFRAN) 8 MG tablet Take 1 tablet (8 mg total) by mouth every 8 (eight) hours as needed for nausea.  20 tablet  0  . oxyCODONE-acetaminophen (PERCOCET/ROXICET) 5-325 MG per tablet Take 1 tablet by mouth every 8 (eight) hours as needed for pain.  90 tablet  0  . promethazine (PHENERGAN) 25 MG suppository Place 1 suppository (25 mg total) rectally every 6 (six) hours as needed for nausea.  6 each  6   No current facility-administered medications for this visit.    OBJECTIVE:  Middle-aged [place] American woman in no acute distress Filed Vitals:   09/17/12 1202  BP: 129/82  Pulse: 70  Temp: 98.5 F (36.9 C)  Resp: 20     Body mass index is 28.11 kg/(m^2).    ECOG FS: 1  Filed Weights   09/17/12 1202  Weight: 168 lb 14.4 oz (76.613 kg)    Sclerae unicteric Oropharynx clear No cervical or supraclavicular adenopathy Lungs no rales or rhonchi Heart regular rate and rhythm Abd soft, nontender, positive bowel sounds MSK no focal spinal tenderness, no peripheral edema  Neuro: nonfocal, well oriented, pleasant affect Breasts: The right breast is unremarkable. In the left breast, the tumor mass takes over the majority of the breast, and there is firmness extending into the axilla itself. There is fixed palpable left axillary adenopathy.   LAB RESULTS: Lab Results  Component Value Date   WBC 2.1* 09/17/2012   NEUTROABS 0.7* 09/17/2012   HGB 11.3* 09/17/2012    HCT 33.6* 09/17/2012   MCV 84.4 09/17/2012   PLT 281 09/17/2012       Chemistry      Component Value Date/Time   NA 138 09/04/2012 1608   NA 138 03/16/2012 1208   K 3.9 09/04/2012 1608   K 4.1 03/16/2012 1208   CL 103 07/05/2012 1448   CL 97 03/16/2012 1208   CO2 28 09/04/2012 1608   CO2 25 03/16/2012 1208   BUN 7.0 09/04/2012 1608   BUN 18 03/16/2012 1208   CREATININE 0.8 09/04/2012 1608   CREATININE 0.88 03/16/2012 1208      Component Value Date/Time   CALCIUM 8.8 09/04/2012 1608   CALCIUM 9.9 03/16/2012 1208   ALKPHOS 57 09/04/2012 1608   ALKPHOS 44 09/22/2011 1006   AST 27 09/04/2012 1608   AST 17 09/22/2011 1006  ALT 7 09/04/2012 1608   ALT 12 09/22/2011 1006   BILITOT 0.40 09/04/2012 1608   BILITOT 0.2* 09/22/2011 1006       Lab Results  Component Value Date   LABCA2 12 04/27/2012    STUDIES: Ct Chest W Contrast  09/10/2012   *RADIOLOGY REPORT*  Clinical Data: Restaging breast cancer.  CT CHEST WITH CONTRAST  Technique:  Multidetector CT imaging of the chest was performed following the standard protocol during bolus administration of intravenous contrast.  Contrast: 80mL OMNIPAQUE IOHEXOL 300 MG/ML  SOLN  Comparison: 07/05/2012 and MR abdomen 04/24/2012.  Findings: Soft tissue mass in the lateral left breast measures 6.3 x 12.1 cm (previously 5.9 x 9.8 cm).  There is retraction of the overlying skin with marked skin thickening.  Left axillary adenopathy measures up to 3.2 x 3.4 cm (previously 2.6 x 3.1 cm).  No pathologically enlarged mediastinal, hilar, right axillary or internal mammary adenopathy.  Heart size normal.  No pericardial effusion.  Lungs are clear.  No pleural fluid.  Airway is unremarkable.  Incidental imaging of the upper abdomen shows a relatively faint low attenuation lesion in the left hepatic lobe, along the peripheral margin, measuring approximately 8 x 11 mm (previously 7 x 8 mm on 04/24/2012).  No worrisome lytic or sclerotic lesions.  A faint area of sclerosis in the upper  sternum (sagittal image 50) is unchanged.  IMPRESSION:  1.  Progression of primary left breast carcinoma and left axillary adenopathy. 2.  Possible enlargement of a known left hepatic lobe metastasis.   Original Report Authenticated By: Leanna Battles, M.D.   Nm Bone Scan Whole Body  09/10/2012   *RADIOLOGY REPORT*  Clinical Data: Restaging breast cancer  NUCLEAR MEDICINE WHOLE BODY BONE SCINTIGRAPHY  Technique:  Whole body anterior and posterior images were obtained approximately 3 hours after intravenous injection of radiopharmaceutical.  Radiopharmaceutical: CURIE TC-MDP TECHNETIUM TC 89M MEDRONATE IV KIT  Comparison: PET CT from 04/22/2011  Findings: Marked asymmetric increased uptake within the left breast compatible with known breast cancer.  No abnormal focus of increased uptake is identified to suggest osseous metastasis.  Normal physiologic tracer uptake is noted within the kidneys and urinary bladder.  IMPRESSION:  1.  No evidence for bone metastasis. 2.  Left breast cancer.   Original Report Authenticated By: Signa Kell, M.D.    ASSESSMENT: 47 y.o.  [place] woman with stage IV breast cancer (liver involvement at presentation but no bone, lung or brain spread),   (1)  S/p upper outr quadrant Left breast biopsy 03/18/2011 showing a clinical T4,N1 invasive ductal carcinoma, high grade, triple negative, with an Mib-1 of 97%, with a single liver metastasis pathologically confirmed 03/31/2011  (2) Status post 4 cycles of q. three-week docetaxel/ doxorubicin/ cyclophosphamide in the neoadjuvant setting, discontinued after 4 cycles due to to peripheral neuropathy.  Status post 2 cycles of every 3 week carboplatin/ gemcitabine, for a total of 6 neoadjuvant cycles altogether, completed 11/09/2011.  (3) refused surgery  (4) with subsequent tumor re-growth, started carboplatin/ gemcitabine 09/10/2012, to be treated days 1 and 8 of each 21 day cycle, 4-6 cycles planned before definitive  surgery  PLAN:  Son is tolerating the treatment well. I have encouraged her to take her nausea medicine as prescribed, since one can "learned to vomit" with repeated episodes of vomiting and that can become very difficult to control.   Her neutrophils are less than 1000 today. We discussed the possibility of canceling today's chemotherapy and picking  up again in 2 weeks with cycle 2, but what she refers to do, and I think is the better option in her case, is to proceed with day 8 treatment today but add Neulasta tomorrow. I have explained that it is imperative for her to call us if she develops a fever. We will add Neupogen days 2 and 3 with cycle 2.  She knows to call for any other problems that may develop before the next visit here.   Tressia Labrum C    09/17/2012

## 2012-09-18 ENCOUNTER — Ambulatory Visit (HOSPITAL_BASED_OUTPATIENT_CLINIC_OR_DEPARTMENT_OTHER): Payer: Medicaid Other

## 2012-09-18 VITALS — BP 114/52 | HR 76 | Temp 98.1°F

## 2012-09-18 DIAGNOSIS — Z5189 Encounter for other specified aftercare: Secondary | ICD-10-CM

## 2012-09-18 DIAGNOSIS — C787 Secondary malignant neoplasm of liver and intrahepatic bile duct: Secondary | ICD-10-CM

## 2012-09-18 DIAGNOSIS — C50419 Malignant neoplasm of upper-outer quadrant of unspecified female breast: Secondary | ICD-10-CM

## 2012-09-18 MED ORDER — PEGFILGRASTIM INJECTION 6 MG/0.6ML
6.0000 mg | Freq: Once | SUBCUTANEOUS | Status: AC
  Administered 2012-09-18: 6 mg via SUBCUTANEOUS
  Filled 2012-09-18: qty 0.6

## 2012-09-20 ENCOUNTER — Ambulatory Visit: Payer: Self-pay | Admitting: Oncology

## 2012-09-20 ENCOUNTER — Other Ambulatory Visit: Payer: Self-pay | Admitting: Lab

## 2012-09-28 ENCOUNTER — Other Ambulatory Visit: Payer: Self-pay | Admitting: Family

## 2012-09-28 DIAGNOSIS — C50412 Malignant neoplasm of upper-outer quadrant of left female breast: Secondary | ICD-10-CM

## 2012-10-01 ENCOUNTER — Other Ambulatory Visit: Payer: Medicaid Other | Admitting: Lab

## 2012-10-01 ENCOUNTER — Encounter: Payer: Self-pay | Admitting: Family

## 2012-10-01 ENCOUNTER — Other Ambulatory Visit (HOSPITAL_BASED_OUTPATIENT_CLINIC_OR_DEPARTMENT_OTHER): Payer: Medicaid Other | Admitting: Lab

## 2012-10-01 ENCOUNTER — Ambulatory Visit (HOSPITAL_BASED_OUTPATIENT_CLINIC_OR_DEPARTMENT_OTHER): Payer: Medicaid Other | Admitting: Family

## 2012-10-01 ENCOUNTER — Ambulatory Visit (HOSPITAL_BASED_OUTPATIENT_CLINIC_OR_DEPARTMENT_OTHER): Payer: Medicaid Other

## 2012-10-01 VITALS — BP 112/71 | HR 76 | Temp 98.2°F | Resp 20 | Ht 65.0 in | Wt 173.1 lb

## 2012-10-01 DIAGNOSIS — Z5111 Encounter for antineoplastic chemotherapy: Secondary | ICD-10-CM

## 2012-10-01 DIAGNOSIS — G609 Hereditary and idiopathic neuropathy, unspecified: Secondary | ICD-10-CM

## 2012-10-01 DIAGNOSIS — C787 Secondary malignant neoplasm of liver and intrahepatic bile duct: Secondary | ICD-10-CM

## 2012-10-01 DIAGNOSIS — N6320 Unspecified lump in the left breast, unspecified quadrant: Secondary | ICD-10-CM

## 2012-10-01 DIAGNOSIS — C50412 Malignant neoplasm of upper-outer quadrant of left female breast: Secondary | ICD-10-CM

## 2012-10-01 DIAGNOSIS — C50419 Malignant neoplasm of upper-outer quadrant of unspecified female breast: Secondary | ICD-10-CM

## 2012-10-01 DIAGNOSIS — C50919 Malignant neoplasm of unspecified site of unspecified female breast: Secondary | ICD-10-CM

## 2012-10-01 DIAGNOSIS — Z171 Estrogen receptor negative status [ER-]: Secondary | ICD-10-CM

## 2012-10-01 DIAGNOSIS — C801 Malignant (primary) neoplasm, unspecified: Secondary | ICD-10-CM

## 2012-10-01 LAB — CBC WITH DIFFERENTIAL/PLATELET
Basophils Absolute: 0 10*3/uL (ref 0.0–0.1)
EOS%: 0.5 % (ref 0.0–7.0)
LYMPH%: 37 % (ref 14.0–49.7)
MCH: 28.2 pg (ref 25.1–34.0)
MCV: 84.9 fL (ref 79.5–101.0)
MONO%: 10.9 % (ref 0.0–14.0)
Platelets: 108 10*3/uL — ABNORMAL LOW (ref 145–400)
RBC: 3.44 10*6/uL — ABNORMAL LOW (ref 3.70–5.45)
RDW: 13.4 % (ref 11.2–14.5)
nRBC: 0 % (ref 0–0)

## 2012-10-01 LAB — COMPREHENSIVE METABOLIC PANEL (CC13)
ALT: 9 U/L (ref 0–55)
CO2: 24 mEq/L (ref 22–29)
Calcium: 8.6 mg/dL (ref 8.4–10.4)
Chloride: 106 mEq/L (ref 98–109)
Potassium: 3.9 mEq/L (ref 3.5–5.1)
Sodium: 139 mEq/L (ref 136–145)
Total Protein: 7.2 g/dL (ref 6.4–8.3)

## 2012-10-01 MED ORDER — ONDANSETRON 8 MG/50ML IVPB (CHCC)
8.0000 mg | Freq: Once | INTRAVENOUS | Status: AC
  Administered 2012-10-01: 8 mg via INTRAVENOUS

## 2012-10-01 MED ORDER — SODIUM CHLORIDE 0.9 % IV SOLN
272.0000 mg | Freq: Once | INTRAVENOUS | Status: AC
  Administered 2012-10-01: 270 mg via INTRAVENOUS
  Filled 2012-10-01: qty 27

## 2012-10-01 MED ORDER — HEPARIN SOD (PORK) LOCK FLUSH 100 UNIT/ML IV SOLN
500.0000 [IU] | Freq: Once | INTRAVENOUS | Status: AC | PRN
  Administered 2012-10-01: 500 [IU]
  Filled 2012-10-01: qty 5

## 2012-10-01 MED ORDER — DEXAMETHASONE SODIUM PHOSPHATE 10 MG/ML IJ SOLN
10.0000 mg | Freq: Once | INTRAMUSCULAR | Status: AC
  Administered 2012-10-01: 10 mg via INTRAVENOUS

## 2012-10-01 MED ORDER — SODIUM CHLORIDE 0.9 % IJ SOLN
10.0000 mL | INTRAMUSCULAR | Status: DC | PRN
  Administered 2012-10-01: 10 mL
  Filled 2012-10-01: qty 10

## 2012-10-01 MED ORDER — SODIUM CHLORIDE 0.9 % IV SOLN
Freq: Once | INTRAVENOUS | Status: AC
  Administered 2012-10-01: 10:00:00 via INTRAVENOUS

## 2012-10-01 MED ORDER — SODIUM CHLORIDE 0.9 % IV SOLN
1000.0000 mg/m2 | Freq: Once | INTRAVENOUS | Status: AC
  Administered 2012-10-01: 1900 mg via INTRAVENOUS
  Filled 2012-10-01: qty 50

## 2012-10-01 NOTE — Progress Notes (Signed)
[facility]  Telephone:(336) [phone] Fax:([phone]  OFFICE PROGRESS NOTE   ID: Alyssa May   DOB: Aug 03, 1965  MR#: [id number]  JYN#:[id number]  QMV:HQIONGEX Not In System GYN: SU: Manus Rudd, MD RAD ONC: Lonie Peak, MD   HISTORY OF PRESENT ILLNESS: Alyssa May is a 47 year-old [place] woman who first noted a small mass in Alyssa May left breast 01/2010, shortly after Alyssa May stopped breast-feeding.  It did not disappear with resumption of Alyssa May periods and grew over the next year, and particularly since Alyssa May had Alyssa May miscarrriage in 12/2010. This brought Alyssa May to the Emergency Room 03/09/2011 where Alyssa May was found to have a fluctuant area in the UOQ of the left breast measuring 8.4 cm. The tissue beneath the cystic area extending towards the axilla and medially was described as firm, but not erythematous. The cystic area was lanced and a large amount of serosanguinos material was expressed. The patient was referred to Dr. Marcille Blanco who saw Alyssa May 03/11/2011. By this time the fluid had largely reaccumulated. He incised the lesion, again draining serosanguinous liquid, packed it, and set the patient up for [facility] breast [facility] 03/14/2011. This showed an irregularly marginated mass measuring 7.3 cm, with abnormal axillary adenopathy. On 01/1102013 bilateral mammography confirmed a high-density mass in the left UOQ. This was biopsied, as was a left axillary lymph node. Both biopsies ([id number]) showed invasive ductal carcinoma, high grade, triple negative, with an Mib-1 of 97%.  Subsequent history is as detailed below.  INTERVAL HISTORY: Alyssa May returns today for metastatic breast cancer.  Today is day 1 cycle 2 of Alyssa May carboplatin/gemcitabine treatment.  Alyssa May interval history is unremarkable.  REVIEW OF SYSTEMS:  Alyssa May reports ongoing left hip discomfort since 08/2012 for which Alyssa May is currently seeing a chiropractor.  Alyssa May states Alyssa May joint discomfort started  after chemotherapy last year.  A bone scan on 09/10/2012 was negative for bone involvement.  Alyssa May has  tolerated the current chemotherapy regimen well.  Alyssa May does not report any nausea/emesis since Alyssa May last treatment on 09/17/2012.  Alyssa May does report that Alyssa May hair has been falling out.  Otherwise a detailed review of systems today was noncontributory   PAST MEDICAL HISTORY: Past Medical History  Diagnosis Date  . Breast abscess   . No pertinent past medical history   . History of bronchitis     last time OCt-Nov 2012  . Seizures     as a toddler but none since  . Joint pain   . History of blood transfusion     in May 2013-no abnormal reaction noted  . Cancer     lt br     PAST SURGICAL HISTORY: Past Surgical History  Procedure Laterality Date  . Dilation and curettage of uterus  01/04/11  . Breast mass excision  2013  . Tonsillectomy    . Portacath placement  04/12/2011    Procedure: INSERTION PORT-A-CATH;  Surgeon: Wilmon Arms. Tsuei, MD;  Location: [facility];  Service: General;  Laterality: Right;  right subclavian  . Incision and drainage breast abscess  03/31/11    FAMILY HISTORY Family History  Problem Relation Age of Onset  . Diabetes Mother   . Hypertension Mother   [name] died from post-operative complications at age 7; the patient's mother is 12, lives in [place] The patient is an only child.  GYNECOLOGIC HISTORY: First pregnancy to term age 35. Alyssa May menstrual cycles were interrupted with chemotherapy, but  resumed 05/2012.  Alyssa May has not had a menses since Alyssa May menstrual cycle in 05/2012.  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 Alyssa May has a good deal of family in this area. Alyssa May husband Rockelle Heuerman is a Hospital doctor for a [employer] in [place] he works there 15 days, and then returns home for another 15 days. Currently he is considering a similar job in [place], [name]. Their daughter, [name], is  being home schooled.     ADVANCED DIRECTIVES: Not in place  HEALTH MAINTENANCE: History  Substance Use Topics  . Smoking status: Never Smoker   . Smokeless tobacco: Never Used  . Alcohol Use: Yes     Comment: occasionally wine     Colonoscopy:  N/A  PAP: 03/18/2011  Bone density: N/A  Lipid panel: Not on file   Allergies  Allergen Reactions  . Sulfa Antibiotics Swelling    Current Outpatient Prescriptions  Medication Sig Dispense Refill  . lidocaine-prilocaine (EMLA) cream Apply 1 application topically as needed. To port  30 g  1  . metoCLOPramide (REGLAN) 10 MG tablet Take 1 tablet (10 mg total) by mouth 4 (four) times daily -  before meals and at bedtime.  120 tablet  6  . ondansetron (ZOFRAN) 8 MG tablet Take 1 tablet (8 mg total) by mouth every 8 (eight) hours as needed for nausea.  20 tablet  0  . oxyCODONE-acetaminophen (PERCOCET/ROXICET) 5-325 MG per tablet Take 1 tablet by mouth every 8 (eight) hours as needed for pain.  90 tablet  0  . promethazine (PHENERGAN) 25 MG suppository Place 1 suppository (25 mg total) rectally every 6 (six) hours as needed for nausea.  6 each  6   No current facility-administered medications for this visit.   Facility-Administered Medications Ordered in Other Visits  Medication Dose Route Frequency Provider Last Rate Last Dose  . sodium chloride 0.9 % injection 10 mL  10 mL Intracatheter PRN Lowella Dell, MD   10 mL at 10/01/12 1218    OBJECTIVE:  Middle-aged [place] American woman in no acute distress  Filed Vitals:   10/01/12 0922  BP: 112/71  Pulse: 76  Temp: 98.2 F (36.8 C)  Resp: 20     Body mass index is 28.81 kg/(m^2).    ECOG FS: 1  Filed Weights   10/01/12 0922  Weight: 173 lb 1.6 oz (78.518 kg)    General appearance: Alert, cooperative, well nourished, no apparent distress Head: Normocephalic, without obvious abnormality, atraumatic Eyes: Conjunctivae/corneas clear, PERRLA, EOMI Nose: Nares, septum and mucosa  are normal, no drainage or sinus tenderness Neck: No adenopathy, supple, symmetrical, trachea midline, thyroid not enlarged, no tenderness Resp: Clear to auscultation bilaterally Cardio: Regular rate and rhythm, S1, S2 normal, no murmur, click, rub or gallop, right chest Port-A-Cath without signs of infection Breasts:  Deferred  GI: Soft, distended, non-tender, hypoactive bowel sounds Extremities: Extremities normal, atraumatic, no cyanosis or edema Lymph nodes: Cervical and supraclavicular nodes normal Neurologic: Grossly normal   LAB RESULTS: Lab Results  Component Value Date   WBC 5.6 10/01/2012   NEUTROABS 2.9 10/01/2012   HGB 9.7* 10/01/2012   HCT 29.2* 10/01/2012   MCV 84.9 10/01/2012   PLT 108 repeated an verified* 10/01/2012       Chemistry      Component Value Date/Time   NA 139 10/01/2012 0858   NA 138 03/16/2012 1208   K 3.9 10/01/2012 0858   K  4.1 03/16/2012 1208   CL 103 07/05/2012 1448   CL 97 03/16/2012 1208   CO2 24 10/01/2012 0858   CO2 25 03/16/2012 1208   BUN 8.7 10/01/2012 0858   BUN 18 03/16/2012 1208   CREATININE 0.8 10/01/2012 0858   CREATININE 0.88 03/16/2012 1208      Component Value Date/Time   CALCIUM 8.6 10/01/2012 0858   CALCIUM 9.9 03/16/2012 1208   ALKPHOS 84 10/01/2012 0858   ALKPHOS 44 09/22/2011 1006   AST 18 10/01/2012 0858   AST 17 09/22/2011 1006   ALT 9 10/01/2012 0858   ALT 12 09/22/2011 1006   BILITOT <0.20 Repeated and Verified 10/01/2012 0858   BILITOT 0.2* 09/22/2011 1006       Lab Results  Component Value Date   LABCA2 12 04/27/2012    STUDIES: Ct Chest W Contrast 09/10/2012   *RADIOLOGY REPORT*  Clinical Data: Restaging breast cancer.  CT CHEST WITH CONTRAST  Technique:  Multidetector CT imaging of the chest was performed following the standard protocol during bolus administration of intravenous contrast.  Contrast: 80mL OMNIPAQUE IOHEXOL 300 MG/ML  SOLN  Comparison: 07/05/2012 and MR abdomen 04/24/2012.  Findings: Soft tissue mass in the  lateral left breast measures 6.3 x 12.1 cm (previously 5.9 x 9.8 cm).  There is retraction of the overlying skin with marked skin thickening.  Left axillary adenopathy measures up to 3.2 x 3.4 cm (previously 2.6 x 3.1 cm).  No pathologically enlarged mediastinal, hilar, right axillary or internal mammary adenopathy.  Heart size normal.  No pericardial effusion.  Lungs are clear.  No pleural fluid.  Airway is unremarkable.  Incidental imaging of the upper abdomen shows a relatively faint low attenuation lesion in the left hepatic lobe, along the peripheral margin, measuring approximately 8 x 11 mm (previously 7 x 8 mm on 04/24/2012).  No worrisome lytic or sclerotic lesions.  A faint area of sclerosis in the upper sternum (sagittal image 50) is unchanged.  IMPRESSION:  1.  Progression of primary left breast carcinoma and left axillary adenopathy. 2.  Possible enlargement of a known left hepatic lobe metastasis.   Original Report Authenticated By: Leanna Battles, M.D.   Nm Bone Scan Whole Body 09/10/2012   *RADIOLOGY REPORT*  Clinical Data: Restaging breast cancer  NUCLEAR MEDICINE WHOLE BODY BONE SCINTIGRAPHY  Technique:  Whole body anterior and posterior images were obtained approximately 3 hours after intravenous injection of radiopharmaceutical.  Radiopharmaceutical: CURIE TC-MDP TECHNETIUM TC 63M MEDRONATE IV KIT  Comparison: PET CT from 04/22/2011  Findings: Marked asymmetric increased uptake within the left breast compatible with known breast cancer.  No abnormal focus of increased uptake is identified to suggest osseous metastasis.  Normal physiologic tracer uptake is noted within the kidneys and urinary bladder.  IMPRESSION:  1.  No evidence for bone metastasis. 2.  Left breast cancer.   Original Report Authenticated By: Signa Kell, M.D.     ASSESSMENT: Alyssa May is a 47 y.o.  [place], [name] woman with stage IV breast cancer (liver involvement at presentation but no bone, lung  or brain spread):  (1)  Status post upper outer quadrant left breast biopsy 03/18/2011 showing a clinical T4,N1 invasive ductal carcinoma, high grade, triple negative, with an Mib-1 of 97%, with a single liver metastasis pathologically confirmed 03/31/2011.  (2) Status post 4 cycles of Qthree-week docetaxel/ doxorubicin/cyclophosphamide in the neoadjuvant setting, discontinued after 4 cycles due to peripheral neuropathy.  Status post 2 cycles of every 3 week  carboplatin/gemcitabine, for a total of 6 neoadjuvant cycles altogether, completed 11/09/2011.  (3) The patient refused surgery.  (4) With subsequent tumor re-growth, started carboplatin/gemcitabine 09/10/2012, to be treated days 1 and 8 of each 21 day cycle, 4-6 cycles planned before definitive surgery.  PLAN:  Alyssa May is tolerating chemotherapy with carboplatin/gemcitabine well.  Alyssa May will receive day 1 cycle 2 of chemotherapy today. We plan to see Alyssa May again on 10/08/2012 for day 8 of cycle 2, and Alyssa May is scheduled for a Neulasta injection on 10/09/2012.  All questions answered.  Alyssa May was encouraged to contact us in the interim with any questions, concerns, or problems.   Larina Bras, NP-C  10/01/2012 1:53 PM

## 2012-10-01 NOTE — Patient Instructions (Addendum)
Please contact us at [phone] if you have any questions or concerns.  Please continue to do well and enjoy life!!!  Get plenty of rest, drink plenty of water, exercise daily (walking), eat a balanced diet, elevate and ice sore hip  Take Vitamin D3 1000 IUs daily.   Results for orders placed in visit on 10/01/12 (from the past 24 hour(s))  CBC WITH DIFFERENTIAL     Status: Abnormal   Collection Time    10/01/12  8:57 AM      Result Value Range   WBC 5.6  3.9 - 10.3 10e3/uL   NEUT# 2.9  1.5 - 6.5 10e3/uL   HGB 9.7 (*) 11.6 - 15.9 g/dL   HCT 84.6 (*) 96.2 - 95.2 %   Platelets 108 repeated an verified (*) 145 - 400 10e3/uL   MCV 84.9  79.5 - 101.0 fL   MCH 28.2  25.1 - 34.0 pg   MCHC 33.2  31.5 - 36.0 g/dL   RBC 8.41 (*) 3.24 - 4.01 10e6/uL   RDW 13.4  11.2 - 14.5 %   lymph# 2.1  0.9 - 3.3 10e3/uL   MONO# 0.6  0.1 - 0.9 10e3/uL   Eosinophils Absolute 0.0  0.0 - 0.5 10e3/uL   Basophils Absolute 0.0  0.0 - 0.1 10e3/uL   NEUT% 51.6  38.4 - 76.8 %   LYMPH% 37.0  14.0 - 49.7 %   MONO% 10.9  0.0 - 14.0 %   EOS% 0.5  0.0 - 7.0 %   BASO% 0.0  0.0 - 2.0 %   nRBC 0  0 - 0 %   Narrative:    Performed At:  [facility]               [address]               [place], [place] [address]

## 2012-10-01 NOTE — Patient Instructions (Addendum)
[facility] Discharge Instructions for Patients Receiving Chemotherapy  Today you received the following chemotherapy agents :  Gemzar, Carboplatin.  To help prevent nausea and vomiting after your treatment, we encourage you to take your nausea medication as instructed by your physician.   If you develop nausea and vomiting that is not controlled by your nausea medication, call the clinic.   BELOW ARE SYMPTOMS THAT SHOULD BE REPORTED IMMEDIATELY:  *FEVER GREATER THAN 100.5 F  *CHILLS WITH OR WITHOUT FEVER  NAUSEA AND VOMITING THAT IS NOT CONTROLLED WITH YOUR NAUSEA MEDICATION  *UNUSUAL SHORTNESS OF BREATH  *UNUSUAL BRUISING OR BLEEDING  TENDERNESS IN MOUTH AND THROAT WITH OR WITHOUT PRESENCE OF ULCERS  *URINARY PROBLEMS  *BOWEL PROBLEMS  UNUSUAL RASH Items with * indicate a potential emergency and should be followed up as soon as possible.  Feel free to call the clinic you have any questions or concerns. The clinic phone number is [phone].    

## 2012-10-02 ENCOUNTER — Ambulatory Visit: Payer: Medicaid Other

## 2012-10-08 ENCOUNTER — Other Ambulatory Visit (HOSPITAL_BASED_OUTPATIENT_CLINIC_OR_DEPARTMENT_OTHER): Payer: Medicaid Other | Admitting: Lab

## 2012-10-08 ENCOUNTER — Encounter: Payer: Self-pay | Admitting: Physician Assistant

## 2012-10-08 ENCOUNTER — Ambulatory Visit (HOSPITAL_BASED_OUTPATIENT_CLINIC_OR_DEPARTMENT_OTHER): Payer: Medicaid Other | Admitting: Physician Assistant

## 2012-10-08 ENCOUNTER — Ambulatory Visit (HOSPITAL_BASED_OUTPATIENT_CLINIC_OR_DEPARTMENT_OTHER): Payer: Medicaid Other

## 2012-10-08 VITALS — BP 102/60 | HR 81 | Temp 97.4°F | Resp 18

## 2012-10-08 VITALS — BP 113/65 | HR 75 | Temp 98.0°F | Resp 16 | Ht 65.0 in | Wt 170.7 lb

## 2012-10-08 DIAGNOSIS — C50419 Malignant neoplasm of upper-outer quadrant of unspecified female breast: Secondary | ICD-10-CM

## 2012-10-08 DIAGNOSIS — N6320 Unspecified lump in the left breast, unspecified quadrant: Secondary | ICD-10-CM

## 2012-10-08 DIAGNOSIS — C787 Secondary malignant neoplasm of liver and intrahepatic bile duct: Secondary | ICD-10-CM

## 2012-10-08 DIAGNOSIS — D649 Anemia, unspecified: Secondary | ICD-10-CM

## 2012-10-08 DIAGNOSIS — Z171 Estrogen receptor negative status [ER-]: Secondary | ICD-10-CM

## 2012-10-08 DIAGNOSIS — C50412 Malignant neoplasm of upper-outer quadrant of left female breast: Secondary | ICD-10-CM

## 2012-10-08 DIAGNOSIS — R110 Nausea: Secondary | ICD-10-CM

## 2012-10-08 DIAGNOSIS — Z5111 Encounter for antineoplastic chemotherapy: Secondary | ICD-10-CM

## 2012-10-08 DIAGNOSIS — C801 Malignant (primary) neoplasm, unspecified: Secondary | ICD-10-CM

## 2012-10-08 LAB — CBC WITH DIFFERENTIAL/PLATELET
BASO%: 0.3 % (ref 0.0–2.0)
LYMPH%: 40 % (ref 14.0–49.7)
MCHC: 33.8 g/dL (ref 31.5–36.0)
MONO#: 0.1 10*3/uL (ref 0.1–0.9)
Platelets: 206 10*3/uL (ref 145–400)
RBC: 3.3 10*6/uL — ABNORMAL LOW (ref 3.70–5.45)
WBC: 2.8 10*3/uL — ABNORMAL LOW (ref 3.9–10.3)
lymph#: 1.1 10*3/uL (ref 0.9–3.3)

## 2012-10-08 LAB — COMPREHENSIVE METABOLIC PANEL (CC13)
ALT: 21 U/L (ref 0–55)
AST: 22 U/L (ref 5–34)
CO2: 26 mEq/L (ref 22–29)
Sodium: 140 mEq/L (ref 136–145)
Total Bilirubin: 0.27 mg/dL (ref 0.20–1.20)
Total Protein: 7.3 g/dL (ref 6.4–8.3)

## 2012-10-08 MED ORDER — ONDANSETRON HCL 8 MG PO TABS: 8.0000 mg | ORAL_TABLET | Freq: Three times a day (TID) | ORAL | Status: DC | PRN

## 2012-10-08 MED ORDER — DEXAMETHASONE SODIUM PHOSPHATE 10 MG/ML IJ SOLN
10.0000 mg | Freq: Once | INTRAMUSCULAR | Status: AC
  Administered 2012-10-08: 10 mg via INTRAVENOUS

## 2012-10-08 MED ORDER — SODIUM CHLORIDE 0.9 % IV SOLN
Freq: Once | INTRAVENOUS | Status: AC
  Administered 2012-10-08: 16:00:00 via INTRAVENOUS

## 2012-10-08 MED ORDER — ONDANSETRON 8 MG/50ML IVPB (CHCC)
8.0000 mg | Freq: Once | INTRAVENOUS | Status: AC
  Administered 2012-10-08: 8 mg via INTRAVENOUS

## 2012-10-08 MED ORDER — SODIUM CHLORIDE 0.9 % IV SOLN
272.0000 mg | Freq: Once | INTRAVENOUS | Status: AC
  Administered 2012-10-08: 270 mg via INTRAVENOUS
  Filled 2012-10-08: qty 27

## 2012-10-08 MED ORDER — HEPARIN SOD (PORK) LOCK FLUSH 100 UNIT/ML IV SOLN
500.0000 [IU] | Freq: Once | INTRAVENOUS | Status: AC | PRN
  Administered 2012-10-08: 500 [IU]
  Filled 2012-10-08: qty 5

## 2012-10-08 MED ORDER — SODIUM CHLORIDE 0.9 % IV SOLN
1000.0000 mg/m2 | Freq: Once | INTRAVENOUS | Status: AC
  Administered 2012-10-08: 1900 mg via INTRAVENOUS
  Filled 2012-10-08: qty 50

## 2012-10-08 MED ORDER — SODIUM CHLORIDE 0.9 % IJ SOLN
10.0000 mL | INTRAMUSCULAR | Status: DC | PRN
  Administered 2012-10-08: 10 mL
  Filled 2012-10-08: qty 10

## 2012-10-08 NOTE — Patient Instructions (Addendum)
[facility] Discharge Instructions for Patients Receiving Chemotherapy  Today you received the following chemotherapy agents Gemzar and Carboplatin.  To help prevent nausea and vomiting after your treatment, we encourage you to take your nausea medication.   If you develop nausea and vomiting that is not controlled by your nausea medication, call the clinic.   BELOW ARE SYMPTOMS THAT SHOULD BE REPORTED IMMEDIATELY:  *FEVER GREATER THAN 100.5 F  *CHILLS WITH OR WITHOUT FEVER  NAUSEA AND VOMITING THAT IS NOT CONTROLLED WITH YOUR NAUSEA MEDICATION  *UNUSUAL SHORTNESS OF BREATH  *UNUSUAL BRUISING OR BLEEDING  TENDERNESS IN MOUTH AND THROAT WITH OR WITHOUT PRESENCE OF ULCERS  *URINARY PROBLEMS  *BOWEL PROBLEMS  UNUSUAL RASH Items with * indicate a potential emergency and should be followed up as soon as possible.  Feel free to call the clinic you have any questions or concerns. The clinic phone number is [phone].    

## 2012-10-08 NOTE — Progress Notes (Signed)
[facility]  [phone] [phone]  OFFICE PROGRESS NOTE   ID: TAWNI MELKONIAN   DOB: [birth year]  MR#: [id number]  JYN#:[id number]  QMV:HQIONGEX Not In System GYN: SU: Manus Rudd, MD RAD ONC: Lonie Peak, MD   HISTORY OF PRESENT ILLNESS: Evaline Waltman is a 47 year-old [place] woman who first noted a small mass in her left breast 01/2010, shortly after she stopped breast-feeding.  It did not disappear with resumption of her periods and grew over the next year, and particularly since she had her miscarrriage in 12/2010. This brought her to the Emergency Room 03/09/2011 where she was found to have a fluctuant area in the UOQ of the left breast measuring 8.4 cm. The tissue beneath the cystic area extending towards the axilla and medially was described as firm, but not erythematous. The cystic area was lanced and a large amount of serosanguinos material was expressed. The patient was referred to Dr. Marcille Blanco who saw her 03/11/2011. By this time the fluid had largely reaccumulated. He incised the lesion, again draining serosanguinous liquid, packed it, and set the patient up for [facility] 03[facility]14/[facility]. This showed an irregularly marginated mass measuring 7.3 cm, with abnormal axillary adenopathy. On 01/1102013 bilateral mammography confirmed a high-density mass in the left UOQ. This was biopsied, as was a left axillary lymph node. Both biopsies ([id number]) showed invasive ductal carcinoma, high grade, triple negative, with an Mib-1 of 97%.  Subsequent history is as detailed below.  INTERVAL HISTORY: Raschelle returns today for followup of her metastatic breast cancer.  She's currently due for day 8 cycle 2 of 4-6 planned q. three-week doses of carboplatin/gemcitabine, with both agents given on days one and 8, and Neulasta given on day 9 for granulocyte support. She has been tolerating treatment well, and had only a few concerns today. She has  some occasional nausea and needs a refill on her ondansetron. She's also slightly constipated.  REVIEW OF SYSTEMS:  Charyl denies any fevers, chills, or night sweats. She's had no skin changes. She denies any abnormal bruising or bleeding. She's had no mouth ulcers or oral sensitivity. She is eating well, and although she has occasional nausea, she has no emesis. She's had no new cough, increased shortness of breath, or chest pain. She denies any abnormal headaches, dizziness, or change in vision. She previously complained of left hip discomfort, but notes that it has slightly improved. She has no additional pain elsewhere. She's had no peripheral swelling or increased peripheral neuropathy.  A detailed review of systems is otherwise stable and noncontributory.   PAST MEDICAL HISTORY: Past Medical History  Diagnosis Date  . Breast abscess   . No pertinent past medical history   . History of bronchitis     last time OCt-Nov 2012  . Seizures     as a toddler but none since  . Joint pain   . History of blood transfusion     in May 2013-no abnormal reaction noted  . Cancer     lt br     PAST SURGICAL HISTORY: Past Surgical History  Procedure Laterality Date  . Dilation and curettage of uterus  01/04/11  . Breast mass excision  2013  . Tonsillectomy    . Portacath placement  04/12/2011    Procedure: INSERTION PORT-A-CATH;  Surgeon: Wilmon Arms. Tsuei, MD;  Location: [facility];  Service: General;  Laterality: Right;  right subclavian  . Incision and drainage breast abscess  03/31/11    FAMILY HISTORY Family History  Problem Relation Age of Onset  . Diabetes Mother   . Hypertension Mother   [name] died from post-operative complications at age 41; the patient's mother is 59, lives in [place] The patient is an only child.  GYNECOLOGIC HISTORY: First pregnancy to term age 53. Her menstrual cycles were interrupted with chemotherapy, but resumed 05/2012.  She has not had a  menses since her menstrual cycle in 05/2012.  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 area. She has a good deal of family in this area. Her husband Emaya Preston is a Hospital doctor for a sedan service in [place] he works there 15 days, and then returns home for another 15 days. Currently he is considering a similar job in [place], [name]. Their daughter, [name], is being home schooled.     ADVANCED DIRECTIVES: Not in place  HEALTH MAINTENANCE: History  Substance Use Topics  . Smoking status: Never Smoker   . Smokeless tobacco: Never Used  . Alcohol Use: Yes     Comment: occasionally wine     Colonoscopy:  N/A  PAP: 03/18/2011  Bone density: N/A  Lipid panel: Not on file   Allergies  Allergen Reactions  . Sulfa Antibiotics Swelling    Current Outpatient Prescriptions  Medication Sig Dispense Refill  . lidocaine-prilocaine (EMLA) cream Apply 1 application topically as needed. To port  30 g  1  . metoCLOPramide (REGLAN) 10 MG tablet Take 1 tablet (10 mg total) by mouth 4 (four) times daily -  before meals and at bedtime.  120 tablet  6  . ondansetron (ZOFRAN) 8 MG tablet Take 1 tablet (8 mg total) by mouth every 8 (eight) hours as needed for nausea.  30 tablet  1  . oxyCODONE-acetaminophen (PERCOCET/ROXICET) 5-325 MG per tablet Take 1 tablet by mouth every 8 (eight) hours as needed for pain.  90 tablet  0  . promethazine (PHENERGAN) 25 MG suppository Place 1 suppository (25 mg total) rectally every 6 (six) hours as needed for nausea.  6 each  6   No current facility-administered medications for this visit.   Facility-Administered Medications Ordered in Other Visits  Medication Dose Route Frequency Provider Last Rate Last Dose  . CARBOplatin (PARAPLATIN) 270 mg in sodium chloride 0.9 % 100 mL chemo infusion  270 mg Intravenous Once Lowella Dell, MD      . Gemcitabine HCl (GEMZAR) 1,900 mg in sodium chloride 0.9 % 100 mL  chemo infusion  1,000 mg/m2 (Treatment Plan Actual) Intravenous Once Lowella Dell, MD 300 mL/hr at 10/08/12 1611 1,900 mg at 10/08/12 1611  . heparin lock flush 100 unit/mL  500 Units Intracatheter Once PRN Lowella Dell, MD      . sodium chloride 0.9 % injection 10 mL  10 mL Intracatheter PRN Lowella Dell, MD        OBJECTIVE:  Middle-aged African American woman in no acute distress  Filed Vitals:   10/08/12 1633  BP: 113/65  Pulse: 75  Temp: 98 F (36.7 C)  Resp: 16     Body mass index is 28.41 kg/(m^2).    ECOG FS: 1  Filed Weights   10/08/12 1633  Weight: 170 lb 11.2 oz (77.429 kg)   .  General appearance: Alert, cooperative, well nourished Sclerae anicteric Mouth is benign, no ulcerations or candidiasis noted No cervical or supraclavicular adenopathy palpated Resp: Clear to  auscultation bilaterally, no wheezes or rhonchi Cardio: Regular rate and rhythm,  no murmur appreciated port intact in the right upper chest wall with no erythema or edema Breasts: In the left breast, the breast itself is firm, with a firmness extending into the left axilla, and a large palpable nodule in the lateral edge of the breast measuring approximately 2 cm. Palpable left axillary adenopathy, fixed. Right axilla is benign  GI: Soft,  non-tender, positive although slightly hypoactive bowel sounds Extremities: No peripheral edema Neuro: Nonfocal, well oriented, friendly affect  LAB RESULTS: Lab Results  Component Value Date   WBC 2.8* 10/08/2012   NEUTROABS 1.6 10/08/2012   HGB 9.5* 10/08/2012   HCT 28.1* 10/08/2012   MCV 85.1 10/08/2012   PLT 206 10/08/2012       Chemistry      Component Value Date/Time   NA 140 10/08/2012 1411   NA 138 03/16/2012 1208   K 3.9 10/08/2012 1411   K 4.1 03/16/2012 1208   CL 103 07/05/2012 1448   CL 97 03/16/2012 1208   CO2 26 10/08/2012 1411   CO2 25 03/16/2012 1208   BUN 13.6 10/08/2012 1411   BUN 18 03/16/2012 1208   CREATININE 0.9 10/08/2012 1411   CREATININE  0.88 03/16/2012 1208      Component Value Date/Time   CALCIUM 8.8 10/08/2012 1411   CALCIUM 9.9 03/16/2012 1208   ALKPHOS 73 10/08/2012 1411   ALKPHOS 44 09/22/2011 1006   AST 22 10/08/2012 1411   AST 17 09/22/2011 1006   ALT 21 10/08/2012 1411   ALT 12 09/22/2011 1006   BILITOT 0.27 10/08/2012 1411   BILITOT 0.2* 09/22/2011 1006       STUDIES:  Ct Chest W Contrast 09/10/2012   *RADIOLOGY REPORT*  Clinical Data: Restaging breast cancer.  CT CHEST WITH CONTRAST  Technique:  Multidetector CT imaging of the chest was performed following the standard protocol during bolus administration of intravenous contrast.  Contrast: 80mL OMNIPAQUE IOHEXOL 300 MG/ML  SOLN  Comparison: 07/05/2012 and MR abdomen 04/24/2012.  Findings: Soft tissue mass in the lateral left breast measures 6.3 x 12.1 cm (previously 5.9 x 9.8 cm).  There is retraction of the overlying skin with marked skin thickening.  Left axillary adenopathy measures up to 3.2 x 3.4 cm (previously 2.6 x 3.1 cm).  No pathologically enlarged mediastinal, hilar, right axillary or internal mammary adenopathy.  Heart size normal.  No pericardial effusion.  Lungs are clear.  No pleural fluid.  Airway is unremarkable.  Incidental imaging of the upper abdomen shows a relatively faint low attenuation lesion in the left hepatic lobe, along the peripheral margin, measuring approximately 8 x 11 mm (previously 7 x 8 mm on 04/24/2012).  No worrisome lytic or sclerotic lesions.  A faint area of sclerosis in the upper sternum (sagittal image 50) is unchanged.  IMPRESSION:  1.  Progression of primary left breast carcinoma and left axillary adenopathy. 2.  Possible enlargement of a known left hepatic lobe metastasis.   Original Report Authenticated By: Leanna Battles, M.D.   Nm Bone Scan Whole Body 09/10/2012   *RADIOLOGY REPORT*  Clinical Data: Restaging breast cancer  NUCLEAR MEDICINE WHOLE BODY BONE SCINTIGRAPHY  Technique:  Whole body anterior and posterior images were obtained  approximately 3 hours after intravenous injection of radiopharmaceutical.  Radiopharmaceutical: CURIE TC-MDP TECHNETIUM TC 61M MEDRONATE IV KIT  Comparison: PET CT from 04/22/2011  Findings: Marked asymmetric increased uptake within the left breast compatible with known  breast cancer.  No abnormal focus of increased uptake is identified to suggest osseous metastasis.  Normal physiologic tracer uptake is noted within the kidneys and urinary bladder.  IMPRESSION:  1.  No evidence for bone metastasis. 2.  Left breast cancer.   Original Report Authenticated By: Signa Kell, M.D.     ASSESSMENT: Mrs. Su Hilt is a 47 y.o.  [place], [name] woman with stage IV breast cancer (liver involvement at presentation but no bone, lung or brain spread):  (1)  Status post upper outer quadrant left breast biopsy 03/18/2011 showing a clinical T4,N1 invasive ductal carcinoma, high grade, triple negative, with an Mib-1 of 97%, with a single liver metastasis pathologically confirmed 03/31/2011.  (2) Status post 4 cycles of Qthree-week docetaxel/ doxorubicin/cyclophosphamide in the neoadjuvant setting, discontinued after 4 cycles due to peripheral neuropathy.  Status post 2 cycles of every 3 week carboplatin/gemcitabine, for a total of 6 neoadjuvant cycles altogether, completed 11/09/2011.  (3) The patient refused surgery.  (4) With subsequent tumor re-growth, started carboplatin/gemcitabine 09/10/2012, to be treated days 1 and 8 of each 21 day cycle, 4-6 cycles planned before definitive surgery.   PLAN:  Amalie  continues to tolerate this regimen well and will proceed to treatment today as scheduled for day 8 cycle 2. Once again, the plan is to proceed to 4-6 cycles prior to definitive surgery. We'll continue to see her for labs and physical exam on each treatment day. She'll continue to receive Neulasta injections on day 9 of each cycle.  I am refilling Carollee is ondansetron which she takes affectively  for nausea. I encouraged her to use stool softeners as needed for constipation, especially since the ondansetron can increase that issue. She voices understanding and agreement with this plan, and will call any changes or problems prior to her next visit here on August 18.     Zollie Scale, PA-C  10/08/2012 4:34 PM

## 2012-10-09 ENCOUNTER — Ambulatory Visit (HOSPITAL_BASED_OUTPATIENT_CLINIC_OR_DEPARTMENT_OTHER): Payer: Medicaid Other

## 2012-10-09 VITALS — BP 115/60 | HR 88 | Temp 98.2°F

## 2012-10-09 DIAGNOSIS — Z5189 Encounter for other specified aftercare: Secondary | ICD-10-CM

## 2012-10-09 DIAGNOSIS — C50419 Malignant neoplasm of upper-outer quadrant of unspecified female breast: Secondary | ICD-10-CM

## 2012-10-09 MED ORDER — PEGFILGRASTIM INJECTION 6 MG/0.6ML
6.0000 mg | Freq: Once | SUBCUTANEOUS | Status: AC
  Administered 2012-10-09: 6 mg via SUBCUTANEOUS
  Filled 2012-10-09: qty 0.6

## 2012-10-11 ENCOUNTER — Telehealth: Payer: Self-pay | Admitting: Oncology

## 2012-10-16 ENCOUNTER — Telehealth: Payer: Self-pay | Admitting: *Deleted

## 2012-10-16 NOTE — Telephone Encounter (Signed)
Per staff message and POF I have scheduled appts.  JMW  

## 2012-10-18 ENCOUNTER — Telehealth: Payer: Self-pay | Admitting: Oncology

## 2012-10-22 ENCOUNTER — Other Ambulatory Visit: Payer: Medicaid Other | Admitting: Lab

## 2012-10-22 ENCOUNTER — Ambulatory Visit (HOSPITAL_COMMUNITY)
Admission: RE | Admit: 2012-10-22 | Discharge: 2012-10-22 | Disposition: A | Discharge status: Home / Self Care | Payer: Medicaid Other | Source: Ambulatory Visit | Attending: Physician Assistant | Admitting: Physician Assistant

## 2012-10-22 ENCOUNTER — Other Ambulatory Visit: Payer: Self-pay | Admitting: Emergency Medicine

## 2012-10-22 ENCOUNTER — Ambulatory Visit (HOSPITAL_BASED_OUTPATIENT_CLINIC_OR_DEPARTMENT_OTHER): Payer: Medicaid Other

## 2012-10-22 ENCOUNTER — Other Ambulatory Visit: Payer: Self-pay | Admitting: Physician Assistant

## 2012-10-22 ENCOUNTER — Other Ambulatory Visit (HOSPITAL_BASED_OUTPATIENT_CLINIC_OR_DEPARTMENT_OTHER): Payer: Medicaid Other

## 2012-10-22 DIAGNOSIS — R05 Cough: Secondary | ICD-10-CM | POA: Insufficient documentation

## 2012-10-22 DIAGNOSIS — C50419 Malignant neoplasm of upper-outer quadrant of unspecified female breast: Secondary | ICD-10-CM

## 2012-10-22 DIAGNOSIS — C50412 Malignant neoplasm of upper-outer quadrant of left female breast: Secondary | ICD-10-CM

## 2012-10-22 DIAGNOSIS — R059 Cough, unspecified: Secondary | ICD-10-CM | POA: Insufficient documentation

## 2012-10-22 DIAGNOSIS — K219 Gastro-esophageal reflux disease without esophagitis: Secondary | ICD-10-CM

## 2012-10-22 DIAGNOSIS — C801 Malignant (primary) neoplasm, unspecified: Secondary | ICD-10-CM

## 2012-10-22 DIAGNOSIS — Z79899 Other long term (current) drug therapy: Secondary | ICD-10-CM | POA: Insufficient documentation

## 2012-10-22 DIAGNOSIS — R0602 Shortness of breath: Secondary | ICD-10-CM

## 2012-10-22 DIAGNOSIS — C787 Secondary malignant neoplasm of liver and intrahepatic bile duct: Secondary | ICD-10-CM

## 2012-10-22 DIAGNOSIS — Z5111 Encounter for antineoplastic chemotherapy: Secondary | ICD-10-CM

## 2012-10-22 DIAGNOSIS — N6320 Unspecified lump in the left breast, unspecified quadrant: Secondary | ICD-10-CM

## 2012-10-22 LAB — CBC WITH DIFFERENTIAL/PLATELET
Basophils Absolute: 0 10*3/uL (ref 0.0–0.1)
EOS%: 1 % (ref 0.0–7.0)
Eosinophils Absolute: 0.1 10*3/uL (ref 0.0–0.5)
HCT: 25.6 % — ABNORMAL LOW (ref 34.8–46.6)
HGB: 8.9 g/dL — ABNORMAL LOW (ref 11.6–15.9)
MCH: 29.8 pg (ref 25.1–34.0)
MCV: 85.6 fL (ref 79.5–101.0)
NEUT#: 5 10*3/uL (ref 1.5–6.5)
NEUT%: 60.8 % (ref 38.4–76.8)
lymph#: 1.9 10*3/uL (ref 0.9–3.3)

## 2012-10-22 LAB — COMPREHENSIVE METABOLIC PANEL (CC13)
AST: 21 U/L (ref 5–34)
BUN: 12.3 mg/dL (ref 7.0–26.0)
Calcium: 8.8 mg/dL (ref 8.4–10.4)
Chloride: 104 mEq/L (ref 98–109)
Creatinine: 0.9 mg/dL (ref 0.6–1.1)
Glucose: 116 mg/dl (ref 70–140)

## 2012-10-22 MED ORDER — LORAZEPAM 0.5 MG PO TABS: 0.5000 mg | ORAL_TABLET | Freq: Two times a day (BID) | ORAL | Status: DC | PRN

## 2012-10-22 MED ORDER — SODIUM CHLORIDE 0.9 % IJ SOLN
10.0000 mL | INTRAMUSCULAR | Status: DC | PRN
  Administered 2012-10-22: 10 mL
  Filled 2012-10-22: qty 10

## 2012-10-22 MED ORDER — OMEPRAZOLE 20 MG PO CPDR: 20.0000 mg | DELAYED_RELEASE_CAPSULE | Freq: Two times a day (BID) | ORAL | Status: DC

## 2012-10-22 MED ORDER — DEXAMETHASONE SODIUM PHOSPHATE 10 MG/ML IJ SOLN
10.0000 mg | Freq: Once | INTRAMUSCULAR | Status: AC
  Administered 2012-10-22: 10 mg via INTRAVENOUS

## 2012-10-22 MED ORDER — SODIUM CHLORIDE 0.9 % IV SOLN
Freq: Once | INTRAVENOUS | Status: AC
  Administered 2012-10-22: 10:00:00 via INTRAVENOUS

## 2012-10-22 MED ORDER — SODIUM CHLORIDE 0.9 % IV SOLN
272.0000 mg | Freq: Once | INTRAVENOUS | Status: AC
  Administered 2012-10-22: 270 mg via INTRAVENOUS
  Filled 2012-10-22: qty 27

## 2012-10-22 MED ORDER — HEPARIN SOD (PORK) LOCK FLUSH 100 UNIT/ML IV SOLN
500.0000 [IU] | Freq: Once | INTRAVENOUS | Status: AC | PRN
  Administered 2012-10-22: 500 [IU]
  Filled 2012-10-22: qty 5

## 2012-10-22 MED ORDER — SODIUM CHLORIDE 0.9 % IV SOLN
1000.0000 mg/m2 | Freq: Once | INTRAVENOUS | Status: AC
  Administered 2012-10-22: 1900 mg via INTRAVENOUS
  Filled 2012-10-22: qty 49.97

## 2012-10-22 MED ORDER — ONDANSETRON 8 MG/50ML IVPB (CHCC)
8.0000 mg | Freq: Once | INTRAVENOUS | Status: AC
  Administered 2012-10-22: 8 mg via INTRAVENOUS

## 2012-10-22 NOTE — Progress Notes (Signed)
Pt in for chemo today c/o three different episodes of SOB with coughing this week.  Pt denies any dizziness,lightheadedness,feeling faint,chest pain. Amy Berry,PA notified of pt's symptoms and into assess patient.

## 2012-10-22 NOTE — Progress Notes (Signed)
Alyssa May was seen briefly in the infusion room today while receiving day 1 cycle 3 of carboplatin/iodine. Alyssa May was complaining of 3 episodes of shortness of breath over the past week. These have typically occurred when she has been resting. Sometimes the start with a cough, sometimes the cough starts following shortness of breath, and at times it seems to occur after eating. She is really unable to give me any additional details.  Here in the clinic today, she denies any shortness of breath. She has had no chest pain, chest pressure, or palpitations whatsoever. She denies any shortness of breath with exertion. She has no chronic dry cough, has had no orthopnea, and denies any peripheral swelling. She does have a history of asthma as a child. She's also been a little anxious this week because both her husband and her daughter have been out of town.  I have started Alyssa May on omeprazole, 20 mg by mouth twice a day for think could be some symptoms associated with GERD. I am also giving her a prescription for lorazepam, 0.5 mg, to take up to twice daily for anxiety to see if this helps as well. We will also obtain a chest x-ray as she leaves today. Otherwise, I have encouraged her to call us and let us know immediately the next time it happens, so that we can get further details and assess further.  Zollie Scale, PA-C 10/22/2012

## 2012-10-22 NOTE — Patient Instructions (Addendum)
[facility] Discharge Instructions for Patients Receiving Chemotherapy  Today you received the following chemotherapy agents Gemzar and Carboplatin.  To help prevent nausea and vomiting after your treatment, we encourage you to take your nausea medication.   If you develop nausea and vomiting that is not controlled by your nausea medication, call the clinic.   BELOW ARE SYMPTOMS THAT SHOULD BE REPORTED IMMEDIATELY:  *FEVER GREATER THAN 100.5 F  *CHILLS WITH OR WITHOUT FEVER  NAUSEA AND VOMITING THAT IS NOT CONTROLLED WITH YOUR NAUSEA MEDICATION  *UNUSUAL SHORTNESS OF BREATH  *UNUSUAL BRUISING OR BLEEDING  TENDERNESS IN MOUTH AND THROAT WITH OR WITHOUT PRESENCE OF ULCERS  *URINARY PROBLEMS  *BOWEL PROBLEMS  UNUSUAL RASH Items with * indicate a potential emergency and should be followed up as soon as possible.  Feel free to call the clinic you have any questions or concerns. The clinic phone number is [phone].    

## 2012-10-23 ENCOUNTER — Telehealth: Payer: Self-pay

## 2012-10-23 ENCOUNTER — Encounter: Payer: Self-pay | Admitting: Dietician

## 2012-10-23 NOTE — Telephone Encounter (Signed)
LMOVM regarding results of chest x ray. Per AB, results are ok. Call office if SOB increases or any questions. TMB

## 2012-10-23 NOTE — Progress Notes (Signed)
Pt registered to attend [facility]'s Breat Cancer Nutrition Class on 10/23/2012 at 1600. However, pt was a no show.  Nandan Willems A. Mayford Knife, RD, LDN Pager: [phone]

## 2012-10-29 ENCOUNTER — Encounter: Payer: Self-pay | Admitting: Family

## 2012-10-29 ENCOUNTER — Telehealth: Payer: Self-pay | Admitting: *Deleted

## 2012-10-29 ENCOUNTER — Other Ambulatory Visit (HOSPITAL_BASED_OUTPATIENT_CLINIC_OR_DEPARTMENT_OTHER): Payer: Medicaid Other | Admitting: Lab

## 2012-10-29 ENCOUNTER — Ambulatory Visit (HOSPITAL_BASED_OUTPATIENT_CLINIC_OR_DEPARTMENT_OTHER): Payer: Medicaid Other

## 2012-10-29 ENCOUNTER — Ambulatory Visit (HOSPITAL_BASED_OUTPATIENT_CLINIC_OR_DEPARTMENT_OTHER): Payer: Medicaid Other | Admitting: Family

## 2012-10-29 VITALS — BP 102/67 | HR 82 | Temp 98.4°F | Resp 20 | Ht 65.0 in | Wt 171.1 lb

## 2012-10-29 DIAGNOSIS — C801 Malignant (primary) neoplasm, unspecified: Secondary | ICD-10-CM

## 2012-10-29 DIAGNOSIS — C50419 Malignant neoplasm of upper-outer quadrant of unspecified female breast: Secondary | ICD-10-CM

## 2012-10-29 DIAGNOSIS — E860 Dehydration: Secondary | ICD-10-CM

## 2012-10-29 DIAGNOSIS — C787 Secondary malignant neoplasm of liver and intrahepatic bile duct: Secondary | ICD-10-CM

## 2012-10-29 DIAGNOSIS — Z5111 Encounter for antineoplastic chemotherapy: Secondary | ICD-10-CM

## 2012-10-29 DIAGNOSIS — Z171 Estrogen receptor negative status [ER-]: Secondary | ICD-10-CM

## 2012-10-29 DIAGNOSIS — Z853 Personal history of malignant neoplasm of breast: Secondary | ICD-10-CM

## 2012-10-29 DIAGNOSIS — C50412 Malignant neoplasm of upper-outer quadrant of left female breast: Secondary | ICD-10-CM

## 2012-10-29 DIAGNOSIS — N6320 Unspecified lump in the left breast, unspecified quadrant: Secondary | ICD-10-CM

## 2012-10-29 DIAGNOSIS — K0381 Cracked tooth: Secondary | ICD-10-CM

## 2012-10-29 LAB — CBC WITH DIFFERENTIAL/PLATELET
BASO%: 0.9 % (ref 0.0–2.0)
Basophils Absolute: 0 10*3/uL (ref 0.0–0.1)
HCT: 24.1 % — ABNORMAL LOW (ref 34.8–46.6)
HGB: 8.4 g/dL — ABNORMAL LOW (ref 11.6–15.9)
MCHC: 35 g/dL (ref 31.5–36.0)
MONO#: 0.1 10*3/uL (ref 0.1–0.9)
NEUT#: 1 10*3/uL — ABNORMAL LOW (ref 1.5–6.5)
NEUT%: 43.9 % (ref 38.4–76.8)
WBC: 2.2 10*3/uL — ABNORMAL LOW (ref 3.9–10.3)
lymph#: 1.1 10*3/uL (ref 0.9–3.3)

## 2012-10-29 LAB — COMPREHENSIVE METABOLIC PANEL (CC13)
ALT: 31 U/L (ref 0–55)
CO2: 25 mEq/L (ref 22–29)
Calcium: 9 mg/dL (ref 8.4–10.4)
Chloride: 105 mEq/L (ref 98–109)
Creatinine: 0.8 mg/dL (ref 0.6–1.1)

## 2012-10-29 MED ORDER — HEPARIN SOD (PORK) LOCK FLUSH 100 UNIT/ML IV SOLN
500.0000 [IU] | Freq: Once | INTRAVENOUS | Status: AC | PRN
  Administered 2012-10-29: 500 [IU]
  Filled 2012-10-29: qty 5

## 2012-10-29 MED ORDER — SODIUM CHLORIDE 0.9 % IV SOLN
1000.0000 mg/m2 | Freq: Once | INTRAVENOUS | Status: AC
  Administered 2012-10-29: 1900 mg via INTRAVENOUS
  Filled 2012-10-29: qty 49.97

## 2012-10-29 MED ORDER — SODIUM CHLORIDE 0.9 % IV SOLN
272.0000 mg | Freq: Once | INTRAVENOUS | Status: AC
  Administered 2012-10-29: 270 mg via INTRAVENOUS
  Filled 2012-10-29: qty 27

## 2012-10-29 MED ORDER — DEXAMETHASONE SODIUM PHOSPHATE 10 MG/ML IJ SOLN
10.0000 mg | Freq: Once | INTRAMUSCULAR | Status: AC
  Administered 2012-10-29: 10 mg via INTRAVENOUS

## 2012-10-29 MED ORDER — ONDANSETRON 8 MG/50ML IVPB (CHCC)
8.0000 mg | Freq: Once | INTRAVENOUS | Status: AC
  Administered 2012-10-29: 8 mg via INTRAVENOUS

## 2012-10-29 MED ORDER — SODIUM CHLORIDE 0.9 % IJ SOLN
10.0000 mL | INTRAMUSCULAR | Status: DC | PRN
  Administered 2012-10-29: 10 mL
  Filled 2012-10-29: qty 10

## 2012-10-29 MED ORDER — SODIUM CHLORIDE 0.9 % IV SOLN
Freq: Once | INTRAVENOUS | Status: AC
  Administered 2012-10-29: 13:00:00 via INTRAVENOUS

## 2012-10-29 NOTE — Patient Instructions (Addendum)
[facility] Discharge Instructions for Patients Receiving Chemotherapy  Today you received the following chemotherapy agents Gemzar/Carboplatin.  To help prevent nausea and vomiting after your treatment, we encourage you to take your nausea medication as prescribed.   If you develop nausea and vomiting that is not controlled by your nausea medication, call the clinic.   BELOW ARE SYMPTOMS THAT SHOULD BE REPORTED IMMEDIATELY:  *FEVER GREATER THAN 100.5 F  *CHILLS WITH OR WITHOUT FEVER  NAUSEA AND VOMITING THAT IS NOT CONTROLLED WITH YOUR NAUSEA MEDICATION  *UNUSUAL SHORTNESS OF BREATH  *UNUSUAL BRUISING OR BLEEDING  TENDERNESS IN MOUTH AND THROAT WITH OR WITHOUT PRESENCE OF ULCERS  *URINARY PROBLEMS  *BOWEL PROBLEMS  UNUSUAL RASH Items with * indicate a potential emergency and should be followed up as soon as possible.  Feel free to call the clinic you have any questions or concerns. The clinic phone number is [phone].    

## 2012-10-29 NOTE — Patient Instructions (Addendum)
Please contact us at [phone] if you have any questions or concerns.  Please continue to do well and enjoy life!!!  Get plenty of rest, drink plenty of water or Gatorade, exercise daily (walking), eat a balanced diet.  Take Vitamin D3 1000 IUs daily.   Dg Chest 2 View 10/22/2012   *RADIOLOGY REPORT*  Clinical Data: 47 year old female with increasing shortness of breath and cough.  History of breast cancer currently undergoing chemotherapy.  CHEST - 2 VIEW  Comparison: 07/26/2011 chest radiograph and 09/10/2012 CT  Findings: The cardiomediastinal silhouette is unremarkable. Elevation of the right hemidiaphragm is again noted. A right subclavian Port-A-Cath is identified with tip overlying the cavoatrial junction. There is no evidence of focal airspace disease, pulmonary edema, suspicious pulmonary nodule/mass, pleural effusion, or pneumothorax. No acute bony abnormalities are identified.  IMPRESSION: No evidence of acute cardiopulmonary disease.   Original Report Authenticated By: Harmon Pier, M.D.   Results for orders placed in visit on 10/29/12 (from the past 24 hour(s))  CBC WITH DIFFERENTIAL     Status: Abnormal   Collection Time    10/29/12 11:12 AM      Result Value Range   WBC 2.2 (*) 3.9 - 10.3 10e3/uL   NEUT# 1.0 (*) 1.5 - 6.5 10e3/uL   HGB 8.4 (*) 11.6 - 15.9 g/dL   HCT 72.5 (*) 36.6 - 44.0 %   Platelets 170  145 - 400 10e3/uL   MCV 84.5  79.5 - 101.0 fL   MCH 29.5  25.1 - 34.0 pg   MCHC 35.0  31.5 - 36.0 g/dL   RBC 3.47 (*) 4.25 - 9.56 10e6/uL   RDW 14.2  11.2 - 14.5 %   lymph# 1.1  0.9 - 3.3 10e3/uL   MONO# 0.1  0.1 - 0.9 10e3/uL   Eosinophils Absolute 0.0  0.0 - 0.5 10e3/uL   Basophils Absolute 0.0  0.0 - 0.1 10e3/uL   NEUT% 43.9  38.4 - 76.8 %   LYMPH% 49.1  14.0 - 49.7 %   MONO% 5.2  0.0 - 14.0 %   EOS% 0.9  0.0 - 7.0 %   BASO% 0.9  0.0 - 2.0 %   Narrative:    Performed At:  [facility]               [address]               [place], [place]  [address]

## 2012-10-29 NOTE — Progress Notes (Addendum)
Alyssa May  [phone] [phone]) [phone]  OFFICE PROGRESS NOTE   ID: Alyssa May   DOB: 06-25-1965  MR#: [id number]  JYN#:[id number]  QMV:HQIONGEX Not In System SU: Alyssa Rudd, MD RAD ONC: Alyssa Peak, MD   HISTORY OF PRESENT ILLNESS: Alyssa May is a 47 year-old [place] woman who first noted a small mass in her left breast 01/2010, shortly after she stopped breast-feeding.  It did not disappear with resumption of her periods and grew over the next year, and particularly since she had her miscarrriage in 12/2010. This brought her to the Emergency Room 03/09/2011 where she was found to have a fluctuant area in the UOQ of the left breast measuring 8.4 cm. The tissue beneath the cystic area extending towards the axilla and medially was described as firm, but not erythematous. The cystic area was lanced and a large amount of serosanguinos material was expressed. The patient was referred to Dr. Marcille May who saw her 03/11/2011. By this time the fluid had largely reaccumulated. He incised the lesion, again draining serosanguinous liquid, packed it, and set the patient up for left breast US at [facility]14[facility]. This showed an irregularly marginated mass measuring 7.3 cm, with abnormal axillary adenopathy. On 01/1102013 bilateral mammography confirmed a high-density mass in the left UOQ. This was biopsied, as was a left axillary lymph node. Both biopsies ([id number]) showed invasive ductal carcinoma, high grade, triple negative, with an Mib-1 of 97%.  Subsequent history is as detailed below.  INTERVAL HISTORY: Alyssa May returns today for followup of her metastatic breast cancer.  She's currently due for day 8 cycle 3 of 4-6 planned  Qthree-week doses of Carboplatin/Gemcitabine, with both agents given on days 1 and 8.  Neulasta is given on day 9 for granulocyte support. She has been tolerating treatment well, and had only a few concerns today. She has recently  experienced some dental issues while undergoing chemotherapy including tooth cracking and crown loosening.  She also states that she feels severely dehydrated at times, and although she drinks copious amounts of water her thirst seems insatiable.  We reviewed the results of her recent 2 view chest x-ray on 10/22/2012 which did not show any acute abnormalities.  REVIEW OF SYSTEMS: Alyssa May denies any fevers, chills, or night sweats. She's had no skin changes. She denies any abnormal bruising or bleeding. She's had no mouth ulcers or oral sensitivity. She is eating well, no nausea, no emesis. She's had no new cough, increased shortness of breath, or chest pain. She denies any abnormal headaches, dizziness, or change in vision. She previously complained of left hip discomfort, but notes that it has slightly improved. She has no additional pain elsewhere. She's had no peripheral swelling or increased peripheral neuropathy.  A detailed review of systems is otherwise stable and noncontributory.   PAST MEDICAL HISTORY: Past Medical History  Diagnosis Date  . Breast abscess   . No pertinent past medical history   . History of bronchitis     last time OCt-Nov 2012  . Seizures     as a toddler but none since  . Joint pain   . History of blood transfusion     in May 2013-no abnormal reaction noted  . Cancer     lt br     PAST SURGICAL HISTORY: Past Surgical History  Procedure Laterality Date  . Dilation and curettage of uterus  01/04/11  . Breast mass excision  2013  . Tonsillectomy    .  Portacath placement  04/12/2011    Procedure: INSERTION PORT-A-CATH;  Surgeon: Alyssa Arms. Tsuei, MD;  Location: [facility];  Service: General;  Laterality: Right;  right subclavian  . Incision and drainage breast abscess  03/31/11    FAMILY HISTORY Family History  Problem Relation Age of Onset  . Diabetes Mother   . Hypertension Mother   [name] died from post-operative complications at age 62; the  patient's mother is 58, lives in [place] The patient is an only child.   GYNECOLOGIC HISTORY: First pregnancy to term age 68. Her menstrual cycles were interrupted with chemotherapy, but resumed 05/2012.  She has not had a menses since her menstrual cycle in 05/2012.  SOCIAL HISTORY: She is a Proofreader, living in [place] but originally from the [place] area. She has a good deal of family in this area. Her husband Alyssa May is a Hospital doctor for a [employer] service in [place] he works there 15 days, and then returns home for another 15 days. Currently he is considering a similar job in [place], [name]. Their daughter, Alyssa May, is being home schooled.     ADVANCED DIRECTIVES: Not in place  HEALTH MAINTENANCE: History  Substance Use Topics  . Smoking status: Never Smoker   . Smokeless tobacco: Never Used  . Alcohol Use: Yes     Comment: occasionally wine     Colonoscopy:  N/A  PAP: 03/18/2011  Bone density: N/A  Lipid panel: Not on file   Allergies  Allergen Reactions  . Sulfa Antibiotics Swelling    Current Outpatient Prescriptions  Medication Sig Dispense Refill  . B Complex CAPS Take 1 capsule by mouth daily.      . Cholecalciferol (VITAMIN D3) 2000 UNITS TABS Take 1 tablet by mouth daily.      [name] GLUCOSAMINE CHONDROITIN COMPLX PO Take 1 each by mouth daily. Liquid glucosamine chondroitin      . lidocaine-prilocaine (EMLA) cream Apply 1 application topically as needed. To port  30 g  1  . LORazepam (ATIVAN) 0.5 MG tablet Take 1 tablet (0.5 mg total) by mouth 2 (two) times daily as needed for anxiety.  20 tablet  0  . metoCLOPramide (REGLAN) 10 MG tablet Take 1 tablet (10 mg total) by mouth 4 (four) times daily -  before meals and at bedtime.  120 tablet  6  . ondansetron (ZOFRAN) 8 MG tablet Take 1 tablet (8 mg total) by mouth every 8 (eight) hours as needed for nausea.  30 tablet  1  . oxyCODONE-acetaminophen (PERCOCET/ROXICET) 5-325 MG per tablet Take 1 tablet  by mouth every 8 (eight) hours as needed for pain.  90 tablet  0  . promethazine (PHENERGAN) 25 MG suppository Place 1 suppository (25 mg total) rectally every 6 (six) hours as needed for nausea.  6 each  6  . omeprazole (PRILOSEC) 20 MG capsule Take 1 capsule (20 mg total) by mouth 2 (two) times daily.  60 capsule  2   No current facility-administered medications for this visit.   Facility-Administered Medications Ordered in Other Visits  Medication Dose Route Frequency Provider Last Rate Last Dose  . heparin lock flush 100 unit/mL  500 Units Intracatheter Once PRN Alyssa Dell, MD      . sodium chloride 0.9 % injection 10 mL  10 mL Intracatheter PRN Alyssa Dell, MD        OBJECTIVE:  Middle-aged [place] American woman in no acute distress  Filed Vitals:   10/29/12  1159  BP: 102/67  Pulse: 82  Temp: 98.4 F (36.9 C)  Resp: 20     Body mass index is 28.47 kg/(m^2).    ECOG FS: 1  Filed Weights   10/29/12 1159  Weight: 171 lb 1.6 oz (77.61 kg)    General appearance: Alert, cooperative, well nourished, no apparent distress  Head: Normocephalic, without obvious abnormality, atraumatic  Eyes: Conjunctivae/corneas clear, PERRLA, EOMI  Nose: Nares, septum and mucosa are normal, no drainage or sinus tenderness  Neck: No adenopathy, supple, symmetrical, trachea midline, thyroid not enlarged, no tenderness  Resp: Clear to auscultation bilaterally  Cardio: Regular rate and rhythm, S1, S2 normal, no murmur, click, rub or gallop, right chest Port-A-Cath without signs of infection  Breasts: The left breast itself is firm, with a firmness extending into the left axilla, and a large palpable nodule in the lateral edge of the breast.  Right breast deferred. GI: Soft, distended, non-tender, hypoactive bowel sounds  Extremities: Extremities normal, atraumatic, no cyanosis or edema  Lymph nodes: Cervical and supraclavicular nodes normal  Neurologic: Grossly normal   LAB RESULTS: Lab  Results  Component Value Date   WBC 2.2* 10/29/2012   NEUTROABS 1.0* 10/29/2012   HGB 8.4* 10/29/2012   HCT 24.1* 10/29/2012   MCV 84.5 10/29/2012   PLT 170 10/29/2012       Chemistry      Component Value Date/Time   NA 140 10/29/2012 1112   NA 138 03/16/2012 1208   K 3.7 10/29/2012 1112   K 4.1 03/16/2012 1208   CL 103 07/05/2012 1448   CL 97 03/16/2012 1208   CO2 25 10/29/2012 1112   CO2 25 03/16/2012 1208   BUN 14.4 10/29/2012 1112   BUN 18 03/16/2012 1208   CREATININE 0.8 10/29/2012 1112   CREATININE 0.88 03/16/2012 1208      Component Value Date/Time   CALCIUM 9.0 10/29/2012 1112   CALCIUM 9.9 03/16/2012 1208   ALKPHOS 78 10/29/2012 1112   ALKPHOS 44 09/22/2011 1006   AST 28 10/29/2012 1112   AST 17 09/22/2011 1006   ALT 31 10/29/2012 1112   ALT 12 09/22/2011 1006   BILITOT 0.33 10/29/2012 1112   BILITOT 0.2* 09/22/2011 1006       STUDIES: Dg Chest 2 View 10/22/2012   *RADIOLOGY REPORT*  Clinical Data: 47 year old female with increasing shortness of breath and cough.  History of breast cancer currently undergoing chemotherapy.  CHEST - 2 VIEW  Comparison: 07/26/2011 chest radiograph and 09/10/2012 CT  Findings: The cardiomediastinal silhouette is unremarkable. Elevation of the right hemidiaphragm is again noted. A right subclavian Port-A-Cath is identified with tip overlying the cavoatrial junction. There is no evidence of focal airspace disease, pulmonary edema, suspicious pulmonary nodule/mass, pleural effusion, or pneumothorax. No acute bony abnormalities are identified.  IMPRESSION: No evidence of acute cardiopulmonary disease.   Original Report Authenticated By: Harmon Pier, M.D.     ASSESSMENT: Alyssa May is a 47 y.o.  [place], [name] woman with stage IV breast cancer (liver involvement at presentation but no bone, lung or brain spread):  (1)  Status post upper outer quadrant left breast biopsy 03/18/2011 showing a clinical T4,N1 invasive ductal carcinoma, high grade,  triple negative, with an Mib-1 of 97%, with a single liver metastasis pathologically confirmed 03/31/2011.  (2) Status post 4 cycles of Qthree-week docetaxel/ doxorubicin/cyclophosphamide in the neoadjuvant setting, discontinued after 4 cycles due to peripheral neuropathy.  Status post 2 cycles of every 3 week carboplatin/gemcitabine, for  a total of 6 neoadjuvant cycles altogether, completed 11/09/2011.  (3) The patient refused surgery.  (4) With subsequent tumor re-growth, started Carboplatin/Gemcitabine on 09/10/2012, with treatment on days 1 and 8 of each 21 day cycle, 4-6 cycles planned before definitive surgery.  (5) Dental problems  (6) Dehydration   PLAN:  Alyssa May continues to tolerate chemotherapy regimen of Carboplatin/Gemcitabine well and will proceed to treatment today as scheduled for day 8 cycle 3.  ANC is 1.0, but Dr. Darnelle Catalan would like the patient to proceed with chemotherapy today and she will receive Neulasta support injection tomorrow.  Once again, the plan is to proceed to 4-6 cycles prior to definitive surgery. We'll continue to see her for labs and physical exam on each treatment day. She'll continue to receive Neulasta injections on day 9 of each cycle.  We will schedule 500 mL of normal saline via IV during each of the next of her Gemcitabine/Carboplatin infusions for dehydration.  A referral has been made to Dr. Robin Searing, DDS to see the patient regarding her recent dental problems in which the patient states she has teeth that are cracking and crowns that are loose.  We plan on seeing Alyssa May again on 11/12/2012 which will be day 1 of cycle 4 of chemotherapy with Gemcitabine/Carboplatin.  Day 8 of cycle 4 is planned for 11/19/2012.  Neulasta injection is scheduled for 11/20/2012.  An appointment with Dr. Darnelle Catalan has been scheduled for 11/28/2012 after her fourth cycle of chemotherapy and after restaging scans of CT of the chest with contrast and CT of the  abdomen/pelvis with contrast have been obtained.  Laboratories of CBC and CMP will be checked at that time.  All questions answered.  Alyssa May was encouraged to contact us in the interim with any problems, questions, or concerns.   Alyssa Bras, NP-C  10/29/2012 2:27 PM  ADDENDUM: Alyssa May is tolerating her chemotherapy generally well. She tells me her teeth are "falling apart" and she blames the chemotherapy for this. Certainly he chemotherapy can affect the kidneys to some extent. We have not been able to make her an appointment with our dentist but will try to facilitate an appointment with a local dentist here or in [place].  I did examine her breast myself and she is certainly having a response. The mass is still hard and fixed however. We are going to obtain a CT scan of the chest after her fourth cycle of chemotherapy to ascertain whether or the tumor is resectable at that time. We will of course also evaluate her response in the liver.  I personally saw this patient and performed a substantive portion of this encounter with the listed APP documented above.   Alyssa Dell, MD

## 2012-10-29 NOTE — Telephone Encounter (Signed)
appts made and printed. [facility]CM was booked for 9/22. Per [facility] for d/t on 9/24 @1pm  to see [facility]CM...td

## 2012-10-29 NOTE — Progress Notes (Signed)
Norina Buzzard called to say per Dr Darnelle Catalan it is okay to treat pt today with chemo and ANC of 1.0,

## 2012-10-30 ENCOUNTER — Ambulatory Visit (HOSPITAL_BASED_OUTPATIENT_CLINIC_OR_DEPARTMENT_OTHER): Payer: Medicaid Other

## 2012-10-30 VITALS — BP 106/61 | HR 86 | Temp 98.7°F

## 2012-10-30 DIAGNOSIS — C787 Secondary malignant neoplasm of liver and intrahepatic bile duct: Secondary | ICD-10-CM

## 2012-10-30 DIAGNOSIS — Z5189 Encounter for other specified aftercare: Secondary | ICD-10-CM

## 2012-10-30 DIAGNOSIS — C50419 Malignant neoplasm of upper-outer quadrant of unspecified female breast: Secondary | ICD-10-CM

## 2012-10-30 MED ORDER — PEGFILGRASTIM INJECTION 6 MG/0.6ML
6.0000 mg | Freq: Once | SUBCUTANEOUS | Status: AC
  Administered 2012-10-30: 6 mg via SUBCUTANEOUS
  Filled 2012-10-30: qty 0.6

## 2012-10-31 ENCOUNTER — Telehealth: Payer: Self-pay | Admitting: *Deleted

## 2012-10-31 NOTE — Telephone Encounter (Signed)
sw pt to see if she has an primary dentist office. i also made her ware thast i emailed [facility] and [facility] informing them that [facility] primary care is for neck, throat, zometa, and xgeva pts. Pt is aware of her appts for her CT. She plans on getting the contrast when she comes in on 11/12/12 and the appts for 11/28/12....td

## 2012-11-06 ENCOUNTER — Telehealth: Payer: Self-pay | Admitting: *Deleted

## 2012-11-06 NOTE — Telephone Encounter (Signed)
Pt called to this RN stating need to see a dentist locally due to " molar broke ".  Defne was seen earlier in summer by dentist [place] " but I don't have a dentist here and will they see someone who has cancer ? "  This RN discussed with [place] dentist in the [place] area will see her- any dentist will request a release for care due to current therapy at this office. Pt will call in am to locate a dentist to be seen. She has percocet she is using for pain. She also started taking amoxicillin that was previously prescribed by other dentist.

## 2012-11-12 ENCOUNTER — Other Ambulatory Visit (HOSPITAL_BASED_OUTPATIENT_CLINIC_OR_DEPARTMENT_OTHER): Payer: Medicaid Other | Admitting: Lab

## 2012-11-12 ENCOUNTER — Telehealth: Payer: Self-pay | Admitting: *Deleted

## 2012-11-12 ENCOUNTER — Ambulatory Visit (HOSPITAL_BASED_OUTPATIENT_CLINIC_OR_DEPARTMENT_OTHER): Payer: Medicaid Other

## 2012-11-12 ENCOUNTER — Telehealth: Payer: Self-pay | Admitting: Oncology

## 2012-11-12 ENCOUNTER — Encounter: Payer: Self-pay | Admitting: Oncology

## 2012-11-12 ENCOUNTER — Ambulatory Visit: Payer: Medicaid Other

## 2012-11-12 ENCOUNTER — Encounter: Payer: Self-pay | Admitting: Family

## 2012-11-12 ENCOUNTER — Ambulatory Visit (HOSPITAL_COMMUNITY)
Admission: RE | Admit: 2012-11-12 | Discharge: 2012-11-12 | Disposition: A | Discharge status: Home / Self Care | Payer: Medicaid Other | Source: Ambulatory Visit | Attending: Oncology | Admitting: Oncology

## 2012-11-12 ENCOUNTER — Ambulatory Visit (HOSPITAL_BASED_OUTPATIENT_CLINIC_OR_DEPARTMENT_OTHER): Payer: Medicaid Other | Admitting: Family

## 2012-11-12 VITALS — BP 113/72 | HR 91 | Temp 98.3°F | Resp 20 | Ht 65.0 in | Wt 170.8 lb

## 2012-11-12 VITALS — BP 92/62 | HR 70 | Temp 97.6°F | Resp 18

## 2012-11-12 DIAGNOSIS — C50919 Malignant neoplasm of unspecified site of unspecified female breast: Secondary | ICD-10-CM | POA: Insufficient documentation

## 2012-11-12 DIAGNOSIS — D649 Anemia, unspecified: Secondary | ICD-10-CM

## 2012-11-12 DIAGNOSIS — Z171 Estrogen receptor negative status [ER-]: Secondary | ICD-10-CM

## 2012-11-12 DIAGNOSIS — C787 Secondary malignant neoplasm of liver and intrahepatic bile duct: Secondary | ICD-10-CM

## 2012-11-12 DIAGNOSIS — K0381 Cracked tooth: Secondary | ICD-10-CM

## 2012-11-12 DIAGNOSIS — C50419 Malignant neoplasm of upper-outer quadrant of unspecified female breast: Secondary | ICD-10-CM

## 2012-11-12 DIAGNOSIS — C50412 Malignant neoplasm of upper-outer quadrant of left female breast: Secondary | ICD-10-CM

## 2012-11-12 LAB — CBC WITH DIFFERENTIAL/PLATELET
BASO%: 0.2 % (ref 0.0–2.0)
HCT: 21.3 % — ABNORMAL LOW (ref 34.8–46.6)
MCHC: 33.5 g/dL (ref 31.5–36.0)
MONO#: 0.8 10*3/uL (ref 0.1–0.9)
NEUT%: 72.2 % (ref 38.4–76.8)
RBC: 2.4 10*6/uL — ABNORMAL LOW (ref 3.70–5.45)
WBC: 6.4 10*3/uL (ref 3.9–10.3)
lymph#: 0.9 10*3/uL (ref 0.9–3.3)

## 2012-11-12 LAB — COMPREHENSIVE METABOLIC PANEL (CC13)
ALT: 24 U/L (ref 0–55)
Albumin: 4 g/dL (ref 3.5–5.0)
CO2: 28 mEq/L (ref 22–29)
Calcium: 9 mg/dL (ref 8.4–10.4)
Chloride: 106 mEq/L (ref 98–109)
Potassium: 3.7 mEq/L (ref 3.5–5.1)
Sodium: 143 mEq/L (ref 136–145)
Total Protein: 7.6 g/dL (ref 6.4–8.3)

## 2012-11-12 LAB — HOLD TUBE, BLOOD BANK

## 2012-11-12 MED ORDER — SODIUM CHLORIDE 0.9 % IV SOLN
250.0000 mL | Freq: Once | INTRAVENOUS | Status: AC
  Administered 2012-11-12: 250 mL via INTRAVENOUS

## 2012-11-12 MED ORDER — DIPHENHYDRAMINE HCL 25 MG PO CAPS
25.0000 mg | ORAL_CAPSULE | Freq: Once | ORAL | Status: AC
  Administered 2012-11-12: 25 mg via ORAL

## 2012-11-12 MED ORDER — DIPHENHYDRAMINE HCL 25 MG PO CAPS
ORAL_CAPSULE | ORAL | Status: AC
  Filled 2012-11-12: qty 1

## 2012-11-12 MED ORDER — HEPARIN SOD (PORK) LOCK FLUSH 100 UNIT/ML IV SOLN
500.0000 [IU] | Freq: Every day | INTRAVENOUS | Status: AC | PRN
  Administered 2012-11-12: 500 [IU]
  Filled 2012-11-12: qty 5

## 2012-11-12 MED ORDER — ACETAMINOPHEN 325 MG PO TABS
ORAL_TABLET | ORAL | Status: AC
  Filled 2012-11-12: qty 2

## 2012-11-12 MED ORDER — ACETAMINOPHEN 325 MG PO TABS
650.0000 mg | ORAL_TABLET | Freq: Once | ORAL | Status: AC
  Administered 2012-11-12: 650 mg via ORAL

## 2012-11-12 MED ORDER — SODIUM CHLORIDE 0.9 % IJ SOLN
10.0000 mL | INTRAMUSCULAR | Status: AC | PRN
Start: 2012-11-12 — End: 2012-11-12
  Administered 2012-11-12: 10 mL
  Filled 2012-11-12: qty 10

## 2012-11-12 NOTE — Patient Instructions (Addendum)
Blood Transfusion Information WHAT IS A BLOOD TRANSFUSION? A transfusion is the replacement of blood or some of its parts. Blood is made up of multiple cells which provide different functions.  Red blood cells carry oxygen and are used for blood loss replacement.  White blood cells fight against infection.  Platelets control bleeding.  Plasma helps clot blood.  Other blood products are available for specialized needs, such as hemophilia or other clotting disorders. BEFORE THE TRANSFUSION  Who gives blood for transfusions?   You may be able to donate blood to be used at a later date on yourself (autologous donation).  Relatives can be asked to donate blood. This is generally not any safer than if you have received blood from a stranger. The same precautions are taken to ensure safety when a relative's blood is donated.  Healthy volunteers who are fully evaluated to make sure their blood is safe. This is blood bank blood. Transfusion therapy is the safest it has ever been in the practice of medicine. Before blood is taken from a donor, a complete history is taken to make sure that person has no history of diseases nor engages in risky social behavior (examples are intravenous drug use or sexual activity with multiple partners). The donor's travel history is screened to minimize risk of transmitting infections, such as malaria. The donated blood is tested for signs of infectious diseases, such as HIV and hepatitis. The blood is then tested to be sure it is compatible with you in order to minimize the chance of a transfusion reaction. If you or a relative donates blood, this is often done in anticipation of surgery and is not appropriate for emergency situations. It takes many days to process the donated blood. RISKS AND COMPLICATIONS Although transfusion therapy is very safe and saves many lives, the main dangers of transfusion include:   Getting an infectious disease.  Developing a  transfusion reaction. This is an allergic reaction to something in the blood you were given. Every precaution is taken to prevent this. The decision to have a blood transfusion has been considered carefully by your caregiver before blood is given. Blood is not given unless the benefits outweigh the risks. AFTER THE TRANSFUSION  Right after receiving a blood transfusion, you will usually feel much better and more energetic. This is especially true if your red blood cells have gotten low (anemic). The transfusion raises the level of the red blood cells which carry oxygen, and this usually causes an energy increase.  The nurse administering the transfusion will monitor you carefully for complications. HOME CARE INSTRUCTIONS  No special instructions are needed after a transfusion. You may find your energy is better. Speak with your caregiver about any limitations on activity for underlying diseases you may have. SEEK MEDICAL CARE IF:   Your condition is not improving after your transfusion.  You develop redness or irritation at the intravenous (IV) site. SEEK IMMEDIATE MEDICAL CARE IF:  Any of the following symptoms occur over the next 12 hours:  Shaking chills.  You have a temperature by mouth above 102 F (38.9 C), not controlled by medicine.  Chest, back, or muscle pain.  People around you feel you are not acting correctly or are confused.  Shortness of breath or difficulty breathing.  Dizziness and fainting.  You get a rash or develop hives.  You have a decrease in urine output.  Your urine turns a dark color or changes to pink, red, or brown. Any of the following   symptoms occur over the next 10 days:  You have a temperature by mouth above 102 F (38.9 C), not controlled by medicine.  Shortness of breath.  Weakness after normal activity.  The white part of the eye turns yellow (jaundice).  You have a decrease in the amount of urine or are urinating less often.  Your  urine turns a dark color or changes to pink, red, or brown. Document Released: 02/19/2000 Document Revised: 05/16/2011 Document Reviewed: 10/08/2007 ExitCare Patient Information 2014 [employer].  

## 2012-11-12 NOTE — Patient Instructions (Addendum)
Please contact us at [phone] if you have any questions or concerns.  Please continue to do well and enjoy life!!!  Get plenty of rest, drink plenty of water, exercise daily, eat a balanced diet.  Take stool softener and miralax daily while taking pain medication.  Results for orders placed in visit on 11/12/12 (from the past 24 hour(s))  CBC WITH DIFFERENTIAL     Status: Abnormal   Collection Time    11/12/12 10:56 AM      Result Value Range   WBC 6.4  3.9 - 10.3 10e3/uL   NEUT# 4.6  1.5 - 6.5 10e3/uL   HGB 7.1 (*) 11.6 - 15.9 g/dL   HCT 78.4 (*) 69.6 - 29.5 %   Platelets 55 (*) 145 - 400 10e3/uL   MCV 88.8  79.5 - 101.0 fL   MCH 29.8  25.1 - 34.0 pg   MCHC 33.5  31.5 - 36.0 g/dL   RBC 2.84 (*) 1.32 - 4.40 10e6/uL   RDW 14.6 (*) 11.2 - 14.5 %   lymph# 0.9  0.9 - 3.3 10e3/uL   MONO# 0.8  0.1 - 0.9 10e3/uL   Eosinophils Absolute 0.0  0.0 - 0.5 10e3/uL   Basophils Absolute 0.0  0.0 - 0.1 10e3/uL   NEUT% 72.2  38.4 - 76.8 %   LYMPH% 14.6  14.0 - 49.7 %   MONO% 12.6  0.0 - 14.0 %   EOS% 0.4  0.0 - 7.0 %   BASO% 0.2  0.0 - 2.0 %   Narrative:    Performed At:  [facility]               [address]               [place], [place] [address]  COMPREHENSIVE METABOLIC PANEL (CC13)     Status: None   Collection Time    11/12/12 10:56 AM      Result Value Range   Sodium 143  136 - 145 mEq/L   Potassium 3.7  3.5 - 5.1 mEq/L   Chloride 106  98 - 109 mEq/L   CO2 28  22 - 29 mEq/L   Glucose 102  70 - 140 mg/dl   BUN 7.7  7.0 - 53.6 mg/dL   Creatinine 0.8  0.6 - 1.1 mg/dL   Total Bilirubin 6.44  0.20 - 1.20 mg/dL   Alkaline Phosphatase 106  40 - 150 U/L   AST 28  5 - 34 U/L   ALT 24  0 - 55 U/L   Total Protein 7.6  6.4 - 8.3 g/dL   Albumin 4.0  3.5 - 5.0 g/dL   Calcium 9.0  8.4 - 03.4 mg/dL   Narrative:    Note: New Reference ranges.Performed At:  [facility]               [address]               [place], [place] [address]

## 2012-11-12 NOTE — Telephone Encounter (Signed)
Lm informed the pt that her appts for 11/19/12 are now cancel....td

## 2012-11-12 NOTE — Telephone Encounter (Signed)
Per staff message and POF I have scheduled appts.  JMW  

## 2012-11-12 NOTE — Progress Notes (Signed)
Faxed signed Physician Information form to [facility] [employer] program.

## 2012-11-12 NOTE — Progress Notes (Signed)
Alyssa May  [phone] [phone]  OFFICE PROGRESS NOTE    ID: Alyssa May   DOB: [birth year]  MR#: [id number]  JYN#:[id number]  QMV:HQIONGEX Not In System SU: Alyssa Rudd, MD RAD ONC: Lonie Peak, MD   HISTORY OF PRESENT ILLNESS: Alyssa May is a 47 year-old [place] woman who first noted a small mass in her left breast 01/2010, shortly after she stopped breast-feeding.  It did not disappear with resumption of her periods and grew over the next year, and particularly since she had her miscarrriage in 12/2010. This brought her to the Emergency Room 03/09/2011 where she was found to have a fluctuant area in the UOQ of the left breast measuring 8.4 cm. The tissue beneath the cystic area extending towards the axilla and medially was described as firm, but not erythematous. The cystic area was lanced and a large amount of serosanguinos material was expressed. The patient was referred to Dr. Marcille Blanco who saw her 03/11/2011. By this time the fluid had largely reaccumulated. He incised the lesion, again draining serosanguinous liquid, packed it, and set the patient up for left breast US at [facility]14[facility]. This showed an irregularly marginated mass measuring 7.3 cm, with abnormal axillary adenopathy. On 01/1102013 bilateral mammography confirmed a high-density mass in the left UOQ. This was biopsied, as was a left axillary lymph node. Both biopsies ([id number]) showed invasive ductal carcinoma, high grade, triple negative, with an Mib-1 of 97%.  Subsequent history is as detailed below.  INTERVAL HISTORY: Alyssa May returns today for followup of her metastatic breast cancer.  She's currently due for day 1 cycle 4 of 4-6 planned, Qthree-week doses of Carboplatin/Gemcitabine, with both agents given on days 1 and 8.  Neulasta is given on day 9 for granulocyte support. She has been tolerating treatment well, and had only a few concerns today. She  continues to experience dental issues while undergoing chemotherapy including tooth cracking and crown loosening.  She was able to be seen by Dr. Kristin Bruins, DDS.  She has been seen by dentist Dr. Blondell Reveal who referred her to Dr. Ocie Doyne.  Alyssa May also has complaints of bilateral upper and lower extremity bruising.  REVIEW OF SYSTEMS: Alyssa May denies any fevers, chills, or night sweats. She has had bilateral upper and lower extremity bruising with regards to skin changes. She denies any abnormal bleeding. She's had no mouth ulcers or oral sensitivity. She is eating well, no nausea, no emesis.  She has increased her use of pain medication (Percocet) due to dental issues.  She's had no new cough, increased shortness of breath, or chest pain. She denies any abnormal headaches, dizziness, or change in vision. She did not complain of any left hip discomfort.  She has no additional pain elsewhere. She's had no peripheral swelling or increased peripheral neuropathy.  A detailed review of systems is otherwise stable and noncontributory.   PAST MEDICAL HISTORY: Past Medical History  Diagnosis Date  . Breast abscess   . No pertinent past medical history   . History of bronchitis     last time OCt-Nov 2012  . Seizures     as a toddler but none since  . Joint pain   . History of blood transfusion     in May 2013-no abnormal reaction noted  . Cancer     lt br     PAST SURGICAL HISTORY: Past Surgical History  Procedure Laterality Date  . Dilation and curettage of uterus  01/04/11  . Breast mass excision  2013  . Tonsillectomy    . Portacath placement  04/12/2011    Procedure: INSERTION PORT-A-CATH;  Surgeon: Wilmon Arms. Tsuei, MD;  Location: [facility];  Service: General;  Laterality: Right;  right subclavian  . Incision and drainage breast abscess  03/31/11    FAMILY HISTORY Family History  Problem Relation Age of Onset  . Diabetes Mother   . Hypertension Mother   [name]  died from post-operative complications at age 53; the patient's mother is 26, lives in [place] The patient is an only child.   GYNECOLOGIC HISTORY: First pregnancy to term age 32. Her menstrual cycles were interrupted with chemotherapy, but resumed in 05/2012.  She has not had a menses since her menstrual cycle in 05/2012.  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 area. She has a good deal of family in this area. Her husband Harmonie Verrastro is a Hospital doctor for a sedan service in [place] he works there 15 days, and then returns home for another 15 days. Currently he is considering a similar job in [place], [name]. Their daughter, [name], is being home schooled.     ADVANCED DIRECTIVES: Not in place  HEALTH MAINTENANCE: History  Substance Use Topics  . Smoking status: Never Smoker   . Smokeless tobacco: Never Used  . Alcohol Use: Yes     Comment: occasionally wine     Colonoscopy:  N/A  PAP: 03/18/2011  Bone density: N/A  Lipid panel: Not on file   Allergies  Allergen Reactions  . Sulfa Antibiotics Swelling    Current Outpatient Prescriptions  Medication Sig Dispense Refill  . B Complex CAPS Take 1 capsule by mouth daily.      . Cholecalciferol (VITAMIN D3) 2000 UNITS TABS Take 1 tablet by mouth daily.      [name] GLUCOSAMINE CHONDROITIN COMPLX PO Take 1 each by mouth daily. Liquid glucosamine chondroitin      . lidocaine-prilocaine (EMLA) cream Apply 1 application topically as needed. To port  30 g  1  . LORazepam (ATIVAN) 0.5 MG tablet Take 1 tablet (0.5 mg total) by mouth 2 (two) times daily as needed for anxiety.  20 tablet  0  . metoCLOPramide (REGLAN) 10 MG tablet Take 1 tablet (10 mg total) by mouth 4 (four) times daily -  before meals and at bedtime.  120 tablet  6  . ondansetron (ZOFRAN) 8 MG tablet Take 1 tablet (8 mg total) by mouth every 8 (eight) hours as needed for nausea.  30 tablet  1  .  oxyCODONE-acetaminophen (PERCOCET/ROXICET) 5-325 MG per tablet Take 1 tablet by mouth every 8 (eight) hours as needed for pain.  90 tablet  0  . promethazine (PHENERGAN) 25 MG suppository Place 1 suppository (25 mg total) rectally every 6 (six) hours as needed for nausea.  6 each  6  . omeprazole (PRILOSEC) 20 MG capsule Take 1 capsule (20 mg total) by mouth 2 (two) times daily.  60 capsule  2   No current facility-administered medications for this visit.   Facility-Administered Medications Ordered in Other Visits  Medication Dose Route Frequency Provider Last Rate Last Dose  . acetaminophen (TYLENOL) 325 MG tablet           . diphenhydrAMINE (BENADRYL) 25 mg capsule           . heparin lock flush 100 unit/mL  500 Units Intracatheter Daily PRN Adela Lank  A Milagros Middendorf, NP      . sodium chloride 0.9 % injection 10 mL  10 mL Intracatheter PRN Keitha Butte, NP        OBJECTIVE:  Middle-aged [place] American woman in no acute distress  Filed Vitals:   11/12/12 1110  BP: 113/72  Pulse: 91  Temp: 98.3 F (36.8 C)  Resp: 20     Body mass index is 28.42 kg/(m^2).    ECOG FS: 1  Filed Weights   11/12/12 1110  Weight: 170 lb 12.8 oz (77.474 kg)    General appearance: Alert, cooperative, well nourished, no apparent distress  Head: Normocephalic, without obvious abnormality, atraumatic, missing dentition/dental caries  Eyes: Conjunctivae/corneas clear, PERRLA, EOMI  Nose: Nares, septum and mucosa are normal, no drainage or sinus tenderness  Neck: No adenopathy, supple, symmetrical, trachea midline, no tenderness  Resp: Clear to auscultation bilaterally  Cardio: Regular rate and rhythm, S1, S2 normal, no murmur, click, rub or gallop, right chest Port-A-Cath without signs of infection  Breasts: Deferred GI: Soft, distended, non-tender, hypoactive bowel sounds  Extremities: Extremities normal, atraumatic, no cyanosis or edema, bilateral upper extremity ecchymosis in various stages of  healing Lymph nodes: Cervical and supraclavicular nodes normal  Neurologic: Grossly normal   LAB RESULTS: Lab Results  Component Value Date   WBC 6.4 11/12/2012   NEUTROABS 4.6 11/12/2012   HGB 7.1* 11/12/2012   HCT 21.3* 11/12/2012   MCV 88.8 11/12/2012   PLT 55* 11/12/2012       Chemistry      Component Value Date/Time   NA 143 11/12/2012 1056   NA 138 03/16/2012 1208   K 3.7 11/12/2012 1056   K 4.1 03/16/2012 1208   CL 103 07/05/2012 1448   CL 97 03/16/2012 1208   CO2 28 11/12/2012 1056   CO2 25 03/16/2012 1208   BUN 7.7 11/12/2012 1056   BUN 18 03/16/2012 1208   CREATININE 0.8 11/12/2012 1056   CREATININE 0.88 03/16/2012 1208      Component Value Date/Time   CALCIUM 9.0 11/12/2012 1056   CALCIUM 9.9 03/16/2012 1208   ALKPHOS 106 11/12/2012 1056   ALKPHOS 44 09/22/2011 1006   AST 28 11/12/2012 1056   AST 17 09/22/2011 1006   ALT 24 11/12/2012 1056   ALT 12 09/22/2011 1006   BILITOT 0.32 11/12/2012 1056   BILITOT 0.2* 09/22/2011 1006       STUDIES: No results found.    ASSESSMENT: Alyssa May is a 47 y.o.  [place], [name] woman with stage IV breast cancer (liver involvement at presentation but no bone, lung or brain spread):  (1)  Status post upper outer quadrant left breast biopsy 03/18/2011 showing a clinical T4,N1 invasive ductal carcinoma, high grade, triple negative, with an Mib-1 of 97%, with a single liver metastasis pathologically confirmed 03/31/2011.  (2) Status post 4 cycles of Qthree-week docetaxel/ doxorubicin/cyclophosphamide in the neoadjuvant setting, discontinued after 4 cycles due to peripheral neuropathy.  Status post 2 cycles of every 3 week carboplatin/gemcitabine, for a total of 6 neoadjuvant cycles altogether, completed 11/09/2011.  (3) The patient refused surgery.  (4) With subsequent tumor re-growth, started Carboplatin/Gemcitabine on 09/10/2012, with treatment on days 1 and 8 of each 21 day cycle, 4-6 cycles planned before definitive surgery.  (5) Dental  problems  (6) Acute anemia   PLAN:  Alyssa May will receive a blood transfusion of 2 units of packed red blood cells today instead of chemotherapy regimen consisting of Carboplatin/Gemcitabine, day 1  of cycle 4.  Her hemoglobin and hematocrit are 7.1/21.3 respectively.  Platelets are 55,000.  We will reschedule her to resume chemotherapy regimen later this week on 11/15/2012 for day 1 of cycle 4.  Day 8 of cycle 4 will be scheduled for 11/22/2012.  Neulasta injection for granulocyte support will be scheduled for 11/23/2012.  Once again, the plan is to proceed to 4-6 cycles prior to definitive surgery. We'll continue to see her for labs and physical exam on each treatment day. She'll continue to receive Neulasta injections on day 9 of each cycle.  We will schedule 500 mL of normal saline via IV during each of the next chemotherapy cycles of Gemcitabine/Carboplatin infusions for dehydration.  Alyssa May will continue to followup with her dentist regarding her recent dental problems in which she states she has teeth that are cracking and crowns that are loose.  Bowel prophylaxis was discussed with Mrs. Roberts in light of increased use of Percocet secondary to tooth pain.  Increased hydration was also encouraged.  An appointment with Dr. Darnelle Catalan has been scheduled for 11/28/2012 after her fourth cycle of chemotherapy and after restaging scans of CT of the chest with contrast and CT of the abdomen/pelvis with contrast have been obtained.  Laboratories of CBC and CMP will be checked at that time.  All questions answered.  Alyssa May was encouraged to contact us in the interim with any problems, questions, or concerns.   Larina Bras, NP-C  11/12/2012 1:17 PM

## 2012-11-13 LAB — TYPE AND SCREEN: Unit division: 0

## 2012-11-15 ENCOUNTER — Other Ambulatory Visit (HOSPITAL_BASED_OUTPATIENT_CLINIC_OR_DEPARTMENT_OTHER): Payer: Medicaid Other | Admitting: Lab

## 2012-11-15 ENCOUNTER — Ambulatory Visit (HOSPITAL_BASED_OUTPATIENT_CLINIC_OR_DEPARTMENT_OTHER): Payer: Medicaid Other | Admitting: Family

## 2012-11-15 ENCOUNTER — Encounter: Payer: Self-pay | Admitting: Family

## 2012-11-15 ENCOUNTER — Ambulatory Visit (HOSPITAL_BASED_OUTPATIENT_CLINIC_OR_DEPARTMENT_OTHER): Payer: Medicaid Other

## 2012-11-15 ENCOUNTER — Telehealth (HOSPITAL_COMMUNITY): Payer: Self-pay

## 2012-11-15 VITALS — BP 119/78 | HR 76 | Temp 98.1°F | Resp 20 | Ht 65.0 in | Wt 162.9 lb

## 2012-11-15 DIAGNOSIS — Z171 Estrogen receptor negative status [ER-]: Secondary | ICD-10-CM

## 2012-11-15 DIAGNOSIS — C773 Secondary and unspecified malignant neoplasm of axilla and upper limb lymph nodes: Secondary | ICD-10-CM

## 2012-11-15 DIAGNOSIS — C50412 Malignant neoplasm of upper-outer quadrant of left female breast: Secondary | ICD-10-CM

## 2012-11-15 DIAGNOSIS — C50419 Malignant neoplasm of upper-outer quadrant of unspecified female breast: Secondary | ICD-10-CM

## 2012-11-15 DIAGNOSIS — C787 Secondary malignant neoplasm of liver and intrahepatic bile duct: Secondary | ICD-10-CM

## 2012-11-15 DIAGNOSIS — N6320 Unspecified lump in the left breast, unspecified quadrant: Secondary | ICD-10-CM

## 2012-11-15 DIAGNOSIS — Z5111 Encounter for antineoplastic chemotherapy: Secondary | ICD-10-CM

## 2012-11-15 DIAGNOSIS — K0381 Cracked tooth: Secondary | ICD-10-CM

## 2012-11-15 DIAGNOSIS — C801 Malignant (primary) neoplasm, unspecified: Secondary | ICD-10-CM

## 2012-11-15 LAB — COMPREHENSIVE METABOLIC PANEL (CC13)
Albumin: 4.1 g/dL (ref 3.5–5.0)
Alkaline Phosphatase: 93 U/L (ref 40–150)
BUN: 10 mg/dL (ref 7.0–26.0)
CO2: 28 mEq/L (ref 22–29)
Calcium: 9 mg/dL (ref 8.4–10.4)
Glucose: 106 mg/dl (ref 70–140)
Potassium: 3.8 mEq/L (ref 3.5–5.1)
Total Protein: 7.9 g/dL (ref 6.4–8.3)

## 2012-11-15 LAB — CBC WITH DIFFERENTIAL/PLATELET
Basophils Absolute: 0 10*3/uL (ref 0.0–0.1)
Eosinophils Absolute: 0.1 10*3/uL (ref 0.0–0.5)
HGB: 10.5 g/dL — ABNORMAL LOW (ref 11.6–15.9)
MCV: 85.6 fL (ref 79.5–101.0)
MONO#: 0.7 10*3/uL (ref 0.1–0.9)
MONO%: 13.7 % (ref 0.0–14.0)
NEUT#: 3.3 10*3/uL (ref 1.5–6.5)
Platelets: 90 10*3/uL — ABNORMAL LOW (ref 145–400)
RDW: 16.5 % — ABNORMAL HIGH (ref 11.2–14.5)
WBC: 5.1 10*3/uL (ref 3.9–10.3)

## 2012-11-15 MED ORDER — ONDANSETRON 8 MG/NS 50 ML IVPB
INTRAVENOUS | Status: AC
  Filled 2012-11-15: qty 8

## 2012-11-15 MED ORDER — ONDANSETRON 8 MG/50ML IVPB (CHCC)
8.0000 mg | Freq: Once | INTRAVENOUS | Status: AC
  Administered 2012-11-15: 8 mg via INTRAVENOUS

## 2012-11-15 MED ORDER — SODIUM CHLORIDE 0.9 % IV SOLN
1000.0000 mg/m2 | Freq: Once | INTRAVENOUS | Status: AC
  Administered 2012-11-15: 1900 mg via INTRAVENOUS
  Filled 2012-11-15: qty 49.97

## 2012-11-15 MED ORDER — DEXAMETHASONE SODIUM PHOSPHATE 10 MG/ML IJ SOLN
INTRAMUSCULAR | Status: AC
  Administered 2012-11-15: 10 mg via INTRAVENOUS
  Filled 2012-11-15: qty 1

## 2012-11-15 MED ORDER — HEPARIN SOD (PORK) LOCK FLUSH 100 UNIT/ML IV SOLN
500.0000 [IU] | Freq: Once | INTRAVENOUS | Status: AC | PRN
  Administered 2012-11-15: 500 [IU]
  Filled 2012-11-15: qty 5

## 2012-11-15 MED ORDER — SODIUM CHLORIDE 0.9 % IV SOLN
272.0000 mg | Freq: Once | INTRAVENOUS | Status: AC
  Administered 2012-11-15: 270 mg via INTRAVENOUS
  Filled 2012-11-15: qty 27

## 2012-11-15 MED ORDER — SODIUM CHLORIDE 0.9 % IV SOLN
Freq: Once | INTRAVENOUS | Status: AC
  Administered 2012-11-15: 12:00:00 via INTRAVENOUS

## 2012-11-15 MED ORDER — DEXAMETHASONE SODIUM PHOSPHATE 10 MG/ML IJ SOLN
10.0000 mg | Freq: Once | INTRAMUSCULAR | Status: AC
  Administered 2012-11-15: 10 mg via INTRAVENOUS

## 2012-11-15 MED ORDER — SODIUM CHLORIDE 0.9 % IJ SOLN
10.0000 mL | INTRAMUSCULAR | Status: DC | PRN
  Administered 2012-11-15: 10 mL
  Filled 2012-11-15: qty 10

## 2012-11-15 NOTE — Patient Instructions (Addendum)
[facility] Discharge Instructions for Patients Receiving Chemotherapy  Today you received the following chemotherapy agents :  Gemzar, Carboplatin.  To help prevent nausea and vomiting after your treatment, we encourage you to take your nausea medication as instructed by your physician.   If you develop nausea and vomiting that is not controlled by your nausea medication, call the clinic.   BELOW ARE SYMPTOMS THAT SHOULD BE REPORTED IMMEDIATELY:  *FEVER GREATER THAN 100.5 F  *CHILLS WITH OR WITHOUT FEVER  NAUSEA AND VOMITING THAT IS NOT CONTROLLED WITH YOUR NAUSEA MEDICATION  *UNUSUAL SHORTNESS OF BREATH  *UNUSUAL BRUISING OR BLEEDING  TENDERNESS IN MOUTH AND THROAT WITH OR WITHOUT PRESENCE OF ULCERS  *URINARY PROBLEMS  *BOWEL PROBLEMS  UNUSUAL RASH Items with * indicate a potential emergency and should be followed up as soon as possible.  Feel free to call the clinic you have any questions or concerns. The clinic phone number is [phone].    

## 2012-11-15 NOTE — Patient Instructions (Addendum)
Please contact us at [phone] if you have any questions or concerns.  Please continue to do well and enjoy life!!!  Get plenty of rest, drink plenty of water, exercise daily (walking), eat a balanced diet.   Results for orders placed in visit on 11/15/12 (from the past 24 hour(s))  CBC WITH DIFFERENTIAL     Status: Abnormal   Collection Time    11/15/12 10:40 AM      Result Value Range   WBC 5.1  3.9 - 10.3 10e3/uL   NEUT# 3.3  1.5 - 6.5 10e3/uL   HGB 10.5 (*) 11.6 - 15.9 g/dL   HCT 78.2 (*) 95.6 - 21.3 %   Platelets 90 (*) 145 - 400 10e3/uL   MCV 85.6  79.5 - 101.0 fL   MCH 28.4  25.1 - 34.0 pg   MCHC 33.2  31.5 - 36.0 g/dL   RBC 0.86  5.78 - 4.69 10e6/uL   RDW 16.5 (*) 11.2 - 14.5 %   lymph# 1.0  0.9 - 3.3 10e3/uL   MONO# 0.7  0.1 - 0.9 10e3/uL   Eosinophils Absolute 0.1  0.0 - 0.5 10e3/uL   Basophils Absolute 0.0  0.0 - 0.1 10e3/uL   NEUT% 65.0  38.4 - 76.8 %   LYMPH% 19.7  14.0 - 49.7 %   MONO% 13.7  0.0 - 14.0 %   EOS% 1.2  0.0 - 7.0 %   BASO% 0.4  0.0 - 2.0 %   Narrative:    Performed At:  [facility]               [address]               [place], [place] [address]

## 2012-11-15 NOTE — Progress Notes (Signed)
[facility]  Telephone:([phone]) [phone] Fax:(336) [phone]  OFFICE PROGRESS NOTE    ID: Alyssa May   DOB: 1965-07-22  MR#: [id number]  JYN#:[id number]  QMV:HQIONGEX Not In System Alyssa: Manus Rudd, MD RAD ONC: Lonie Peak, MD   HISTORY OF PRESENT ILLNESS: Alyssa May is a 47 year-old [place] woman who first noted a small mass in her left breast 01/2010, shortly after she stopped breast-feeding.  It did not disappear with resumption of her periods and grew over the next year, and particularly since she had her miscarrriage in 12/2010. This brought her to the Emergency Room 03/09/2011 where she was found to have a fluctuant area in the UOQ of the left breast measuring 8.4 cm. The tissue beneath the cystic area extending towards the axilla and medially was described as firm, but not erythematous. The cystic area was lanced and a large amount of serosanguinos material was expressed. The patient was referred to Dr. Marcille Blanco who saw her 03/11/2011. By this time the fluid had largely reaccumulated. He incised the lesion, again draining serosanguinous liquid, packed it, and set the patient up for [facility] breast [facility] 03[facility]14/2011. This showed an irregularly marginated mass measuring 7.3 cm, with abnormal axillary adenopathy. On 01/1102013 bilateral mammography confirmed a high-density mass in the left UOQ. This was biopsied, as was a left axillary lymph node. Both biopsies ([id number]) showed invasive ductal carcinoma, high grade, triple negative, with an Mib-1 of 97%.  Her subsequent history is as detailed below.  INTERVAL HISTORY: [name] returns today for followup of her metastatic breast cancer.  She's currently due for day 1 cycle 4 of 4-6 planned, Qthree-week doses of Carboplatin/Gemcitabine, with both agents given on days 1 and 8.  Neulasta is given on day 9 for granulocyte support.  During her last office visit on  11/12/2012 she was anemic and received 2  units of packed red blood cells instead of the start of chemotherapy cycle 4.  She tolerated the blood transfusions well and has been tolerating chemotherapy well.   She continues to experience dental issues while undergoing chemotherapy including tooth cracking and crown loosening.  She is scheduled to be seen by Dr.  Kristin Bruins, DDS tomorrow.    REVIEW OF SYSTEMS: [name] denies any fevers, chills, or night sweats.  Bilateral upper and lower extremity bruising has improved. She denies any abnormal bleeding. She's had no mouth ulcers or oral sensitivity. She is not eating well due to dental issues. She has increased her use of pain medication (Percocet) due to dental issues.  She denies any nausea and emesis.   She's had no new cough, increased shortness of breath, or chest pain. She denies any abnormal headaches, dizziness, or change in vision.  She denies any myalgias/arthralgias.  She's had no peripheral swelling or increased peripheral neuropathy.  A detailed review of systems is otherwise stable and noncontributory.   PAST MEDICAL HISTORY: Past Medical History  Diagnosis Date  . Breast abscess   . No pertinent past medical history   . History of bronchitis     last time OCt-Nov 2012  . Seizures     as a toddler but none since  . Joint pain   . History of blood transfusion     in May 2013-no abnormal reaction noted  . Cancer     lt br     PAST SURGICAL HISTORY: Past Surgical History  Procedure Laterality Date  . Dilation and curettage of uterus  01/04/11  . Breast mass excision  2013  . Tonsillectomy    . Portacath placement  04/12/2011    Procedure: INSERTION PORT-A-CATH;  Surgeon: Wilmon Arms. Tsuei, MD;  Location: [facility];  Service: General;  Laterality: Right;  right subclavian  . Incision and drainage breast abscess  03/31/11    FAMILY HISTORY Family History  Problem Relation Age of Onset  . Diabetes Mother   . Hypertension Mother   [name] died from  post-operative complications at age 33; the patient's mother is 36, lives in [place] The patient is an only child.   GYNECOLOGIC HISTORY: First pregnancy to term age 67. Her menstrual cycles were interrupted with chemotherapy, but resumed in 05/2012.  She has not had a menses since her menstrual cycle in 05/2012.  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. She has a good deal of family in this area. Her husband Samyra Limb is a Hospital doctor for a sedan service in [place] he works there 15 days, and then returns home for another 15 days. Currently he is considering a similar job in [place], [name]. Their daughter, [name], is being home schooled.     ADVANCED DIRECTIVES: Not in place  HEALTH MAINTENANCE: History  Substance Use Topics  . Smoking status: Never Smoker   . Smokeless tobacco: Never Used  . Alcohol Use: Yes     Comment: occasionally wine     Colonoscopy:  N/A  PAP: 03/18/2011  Bone density: N/A  Lipid panel: Not on file   Allergies  Allergen Reactions  . Sulfa Antibiotics Swelling    Current Outpatient Prescriptions  Medication Sig Dispense Refill  . B Complex CAPS Take 1 capsule by mouth daily.      . Cholecalciferol (VITAMIN D3) 2000 UNITS TABS Take 1 tablet by mouth daily.      [name] GLUCOSAMINE CHONDROITIN COMPLX PO Take 1 each by mouth daily. Liquid glucosamine chondroitin      . lidocaine-prilocaine (EMLA) cream Apply 1 application topically as needed. To port  30 g  1  . LORazepam (ATIVAN) 0.5 MG tablet Take 1 tablet (0.5 mg total) by mouth 2 (two) times daily as needed for anxiety.  20 tablet  0  . metoCLOPramide (REGLAN) 10 MG tablet Take 1 tablet (10 mg total) by mouth 4 (four) times daily -  before meals and at bedtime.  120 tablet  6  . omeprazole (PRILOSEC) 20 MG capsule Take 1 capsule (20 mg total) by mouth 2 (two) times daily.  60 capsule  2  . ondansetron (ZOFRAN) 8 MG tablet Take 1 tablet  (8 mg total) by mouth every 8 (eight) hours as needed for nausea.  30 tablet  1  . oxyCODONE-acetaminophen (PERCOCET/ROXICET) 5-325 MG per tablet Take 1 tablet by mouth every 8 (eight) hours as needed for pain.  90 tablet  0  . promethazine (PHENERGAN) 25 MG suppository Place 1 suppository (25 mg total) rectally every 6 (six) hours as needed for nausea.  6 each  6   No current facility-administered medications for this visit.   Facility-Administered Medications Ordered in Other Visits  Medication Dose Route Frequency Provider Last Rate Last Dose  . CARBOplatin (PARAPLATIN) 270 mg in sodium chloride 0.9 % 100 mL chemo infusion  270 mg Intravenous Once Lowella Dell, MD      . Gemcitabine HCl (GEMZAR) 1,900 mg in sodium chloride 0.9 % 100 mL chemo infusion  1,000  mg/m2 (Treatment Plan Actual) Intravenous Once Lowella Dell, MD      . heparin lock flush 100 unit/mL  500 Units Intracatheter Once PRN Lowella Dell, MD      . ondansetron [facility]) 8 MG/50ML IVPB           . ondansetron (ZOFRAN) IVPB 8 mg  8 mg Intravenous Once Lowella Dell, MD   8 mg at 11/15/12 1220  . sodium chloride 0.9 % injection 10 mL  10 mL Intracatheter PRN Lowella Dell, MD        OBJECTIVE:  Middle-aged [place] American woman in no acute distress  Filed Vitals:   11/15/12 1057  BP: 119/78  Pulse: 76  Temp: 98.1 F (36.7 C)  Resp: 20     Body mass index is 27.11 kg/(m^2).    ECOG FS: 1  Filed Weights   11/15/12 1057  Weight: 162 lb 14.4 oz (73.891 kg)    General appearance: Alert, cooperative, well nourished, no apparent distress  Head: Normocephalic, without obvious abnormality, atraumatic, missing dentition/dental caries  Eyes: Conjunctivae/corneas clear, PERRLA, EOMI  Nose: Nares, septum and mucosa are normal, no drainage or sinus tenderness  Neck: No adenopathy, supple, symmetrical, trachea midline, no tenderness  Resp: Clear to auscultation bilaterally  Cardio: Regular rate and rhythm,  S1, S2 normal, no murmur, click, rub or gallop, right chest Port-A-Cath covered in EMLA cream and with an occlusive dressing Breasts: Deferred GI: Soft, distended, non-tender, hypoactive bowel sounds  Extremities: Extremities normal, atraumatic, no cyanosis or edema Lymph nodes: Cervical and supraclavicular nodes normal  Neurologic: Grossly normal   LAB RESULTS: Lab Results  Component Value Date   WBC 5.1 11/15/2012   NEUTROABS 3.3 11/15/2012   HGB 10.5* 11/15/2012   HCT 31.6* 11/15/2012   MCV 85.6 11/15/2012   PLT 90* 11/15/2012       Chemistry      Component Value Date/Time   NA 142 11/15/2012 1040   NA 138 03/16/2012 1208   K 3.8 11/15/2012 1040   K 4.1 03/16/2012 1208   CL 103 07/05/2012 1448   CL 97 03/16/2012 1208   CO2 28 11/15/2012 1040   CO2 25 03/16/2012 1208   BUN 10.0 11/15/2012 1040   BUN 18 03/16/2012 1208   CREATININE 0.8 11/15/2012 1040   CREATININE 0.88 03/16/2012 1208      Component Value Date/Time   CALCIUM 9.0 11/15/2012 1040   CALCIUM 9.9 03/16/2012 1208   ALKPHOS 93 11/15/2012 1040   ALKPHOS 44 09/22/2011 1006   AST 31 11/15/2012 1040   AST 17 09/22/2011 1006   ALT 29 11/15/2012 1040   ALT 12 09/22/2011 1006   BILITOT 0.31 11/15/2012 1040   BILITOT 0.2* 09/22/2011 1006       STUDIES: No results found.    ASSESSMENT: [name] is a 47 y.o.  [place], [name] woman with stage IV breast cancer (liver involvement at presentation but no bone, lung or brain spread):  (1)  Status post upper outer quadrant left breast biopsy 03/18/2011 showing a clinical T4,N1 invasive ductal carcinoma, high grade, triple negative, with an Mib-1 of 97%, with a single liver metastasis pathologically confirmed 03/31/2011.  (2) Status post 4 cycles of Qthree-week docetaxel/ doxorubicin/cyclophosphamide in the neoadjuvant setting, discontinued after 4 cycles due to peripheral neuropathy.  Status post 2 cycles of every 3 week carboplatin/gemcitabine, for a total of 6 neoadjuvant  cycles altogether, completed 11/09/2011.  (3) The patient refused surgery.  (4) With  subsequent tumor re-growth, started Carboplatin/Gemcitabine on 09/10/2012, with treatment on days 1 and 8 of each 21 day cycle, 4-6 cycles planned before definitive surgery.  (5) Dental problems    PLAN:  [name] will proceed with chemotherapy day 1 of cycle 4 consisting of Carboplatinum/Gemcitabine.  Day 8 of cycle 4 is scheduled for 11/22/2012.  Neulasta injection for granulocyte support is scheduled for 11/23/2012.  Once again, the plan is to proceed with 4-6 cycles of chemotherapy prior to definitive surgery. We'll continue to see her for labs and physical exam on each treatment day. She'll continue to receive Neulasta injections on day 9 of each cycle.  We will schedule 500 mL of normal saline via IV during each of the next chemotherapy cycles of Gemcitabine/Carboplatin infusions for dehydration.  [name] is scheduled to see Dr. Robin Searing tomorrow on 11/16/2012 for recent dental problems in which she states she has teeth that are cracking and crowns that are loose.  Bowel prophylaxis was discussed with Mrs. Roberts in light of increased use of Percocet secondary to tooth pain.  Increased hydration was also encouraged.  An appointment with Dr. Darnelle Catalan has been scheduled for 11/28/2012 after her fourth cycle of chemotherapy and after restaging scans of CT of the chest with contrast and CT of the abdomen/pelvis with contrast have been obtained.  Laboratories of CBC and CMP will be checked at that time.  All questions answered.  [name] was encouraged to contact us in the interim with any problems, questions, or concerns.   Larina Bras, NP-C  11/15/2012 12:31 PM

## 2012-11-15 NOTE — Progress Notes (Signed)
Per Annice Pih, NP -  Proceed with chemo with platelet count 90.   Pt saw Annice Pih, NP prior to chemo today.

## 2012-11-16 ENCOUNTER — Ambulatory Visit (HOSPITAL_COMMUNITY): Payer: Self-pay | Admitting: Dentistry

## 2012-11-16 ENCOUNTER — Encounter (HOSPITAL_COMMUNITY): Payer: Self-pay | Admitting: Dentistry

## 2012-11-16 VITALS — BP 101/65 | HR 80 | Temp 97.9°F

## 2012-11-16 DIAGNOSIS — K029 Dental caries, unspecified: Secondary | ICD-10-CM

## 2012-11-16 DIAGNOSIS — M264 Malocclusion, unspecified: Secondary | ICD-10-CM

## 2012-11-16 DIAGNOSIS — K0401 Reversible pulpitis: Secondary | ICD-10-CM

## 2012-11-16 DIAGNOSIS — K089 Disorder of teeth and supporting structures, unspecified: Secondary | ICD-10-CM

## 2012-11-16 DIAGNOSIS — D696 Thrombocytopenia, unspecified: Secondary | ICD-10-CM

## 2012-11-16 DIAGNOSIS — C50919 Malignant neoplasm of unspecified site of unspecified female breast: Secondary | ICD-10-CM

## 2012-11-16 DIAGNOSIS — K08409 Partial loss of teeth, unspecified cause, unspecified class: Secondary | ICD-10-CM

## 2012-11-16 DIAGNOSIS — K0530 Chronic periodontitis, unspecified: Secondary | ICD-10-CM

## 2012-11-16 DIAGNOSIS — K036 Deposits [accretions] on teeth: Secondary | ICD-10-CM

## 2012-11-16 DIAGNOSIS — C50419 Malignant neoplasm of upper-outer quadrant of unspecified female breast: Secondary | ICD-10-CM

## 2012-11-16 NOTE — Patient Instructions (Signed)
The patient is to followup with Dr. Ocie Doyne (oral surgeon) for dental consultation on 11/27/2012 for evaluation of extraction of tooth numbers 4, 8, 16, and 29 with alveoloplasty. Dr. Barbette Merino will then schedule surgery as indicated after discussion with Dr. Darnelle Catalan concerning thrombocytopenia and need for antibiotic premedication. After adequate healing, the patient is a followup Dr. Blondell Reveal for an exam, periodontal therapy, dental restorations, and replacement of missing teeth is indicated. Dr. Blondell Reveal is to coordinate dental treatment with chemotherapy as indicated, although patient indicates that she will be offered the chemotherapy soon. I will assist Dr. Blondell Reveal in coordinating dental treatment as needed. Dr. Kristin Bruins

## 2012-11-16 NOTE — Progress Notes (Signed)
DENTAL CONSULTATION  Date of Consultation:  11/16/2012 Patient Name:   Alyssa May Date of Birth:   1966-01-04 Medical Record Number: [id number]  VITALS: BP 101/65  Pulse 80  Temp(Src) 97.9 F (36.6 C) (Oral)  LMP 05/22/2012  CHIEF COMPLAINT: Patient referred by Dr. Darnelle Catalan for a dental consultation.  HPI: Alyssa May is a 47 year old female with history of metastatic breast cancer currently undergoing active chemotherapy with gemcitabine and carboplatin.  Consultation was requested to rule out dental infection that may affect the patient's systemic health while undergoing active chemotherapy. Patient also seen as part of a pre-Zometa dental protocol evaluation.  The patient currently is complaining of  "cracked teeth and loose crowns ". The patient feels that the chemotherapy has" ruined her mouth". Patient also is complaining of acute toothache symptoms coming from upper left molar #16. Patient describes the pain as being intermittent in nature. Pain reaches intensity of 6/10 and is currently 0/10 in intensity today. Patient describes dull, aching pain as well as sharp pain from time to time. This tooth pain is relieved with the Percocet pain medication that she uses. This tooth has been bothering her over a period of several months.  Patient indicates that she last saw a primary dentist, Dr. Blondell Reveal, in [place] on 11/07/2012. Patient had an exam and 3 radiographs taken and was then referred to an oral surgeon for dental consultation. Patient was referred to Dr. Ocie Doyne to evaluate extraction of tooth numbers 8, 16, and 29. Patient indicates that she has an appointment on 11/27/2012 for a consultation only.  Patient indicates that she last saw her regular primary dentist in [place] to have tooth number #8 restored with a core and then recementation of the existing crown. The crown was initially fabricated in 2001.  The Crown has fallen off several times since the  initial crown was inserted. The crown has again fallen off. The patient is now interested in having the tooth extracted at this time. The patient has been unable to receive routine periodontal care due to economic concerns and recent cancer diagnosis.    PROBLEM LIST: Patient Active Problem List   Diagnosis Date Noted  . Cancer of upper-outer quadrant of female breast 03/28/2011    Priority: High  . Anemia 08/10/2011  . Febrile neutropenia 07/26/2011  . Cancer 06/28/2011  . Left breast mass 03/11/2011    PMH: Past Medical History  Diagnosis Date  . Breast abscess 03/2011  . History of bronchitis     last time [facility]-Nov 2012  . Seizures     as a toddler ,but none since then.  . Joint pain   . History of blood transfusion     in May 2013-no abnormal reaction noted  . Cancer     Left Breast    PSH: Past Surgical History  Procedure Laterality Date  . Dilation and curettage of uterus  01/04/11  . Breast mass excision  2013  . Tonsillectomy    . Portacath placement  04/12/2011    Procedure: INSERTION PORT-A-CATH;  Surgeon: Wilmon Arms. Tsuei, MD;  Location: [facility];  Service: General;  Laterality: Right;  right subclavian  . Incision and drainage breast abscess  03/31/11    ALLERGIES: Allergies  Allergen Reactions  . Sulfa Antibiotics Swelling    MEDICATIONS: Current Outpatient Prescriptions  Medication Sig Dispense Refill  . B Complex CAPS Take 1 capsule by mouth daily.      . Cholecalciferol (VITAMIN D3) 2000 UNITS  TABS Take 1 tablet by mouth daily.      [name] GLUCOSAMINE CHONDROITIN COMPLX PO Take 1 each by mouth daily. Liquid glucosamine chondroitin      . lidocaine-prilocaine (EMLA) cream Apply 1 application topically as needed. To port  30 g  1  . LORazepam (ATIVAN) 0.5 MG tablet Take 1 tablet (0.5 mg total) by mouth 2 (two) times daily as needed for anxiety.  20 tablet  0  . metoCLOPramide (REGLAN) 10 MG tablet Take 1 tablet (10 mg total) by mouth 4 (four) times daily -   before meals and at bedtime.  120 tablet  6  . omeprazole (PRILOSEC) 20 MG capsule Take 1 capsule (20 mg total) by mouth 2 (two) times daily.  60 capsule  2  . ondansetron (ZOFRAN) 8 MG tablet Take 1 tablet (8 mg total) by mouth every 8 (eight) hours as needed for nausea.  30 tablet  1  . oxyCODONE-acetaminophen (PERCOCET/ROXICET) 5-325 MG per tablet Take 1 tablet by mouth every 8 (eight) hours as needed for pain.  90 tablet  0  . promethazine (PHENERGAN) 25 MG suppository Place 1 suppository (25 mg total) rectally every 6 (six) hours as needed for nausea.  6 each  6   No current facility-administered medications for this visit.    LABS: Lab Results  Component Value Date   WBC 5.1 11/15/2012   HGB 10.5* 11/15/2012   HCT 31.6* 11/15/2012   MCV 85.6 11/15/2012   PLT 90* 11/15/2012      Component Value Date/Time   NA 142 11/15/2012 1040   NA 138 03/16/2012 1208   K 3.8 11/15/2012 1040   K 4.1 03/16/2012 1208   CL 103 07/05/2012 1448   CL 97 03/16/2012 1208   CO2 28 11/15/2012 1040   CO2 25 03/16/2012 1208   GLUCOSE 106 11/15/2012 1040   GLUCOSE 94 07/05/2012 1448   GLUCOSE 117* 03/16/2012 1208   BUN 10.0 11/15/2012 1040   BUN 18 03/16/2012 1208   CREATININE 0.8 11/15/2012 1040   CREATININE 0.88 03/16/2012 1208   CALCIUM 9.0 11/15/2012 1040   CALCIUM 9.9 03/16/2012 1208   GFRNONAA 79* 03/16/2012 1208   GFRAA >90 03/16/2012 1208   No results found for this basename: INR, PROTIME   No results found for this basename: PTT    SOCIAL HISTORY: History   Social History  . Marital Status: Married    Spouse Name: N/A    Number of Children: 1  . Years of Education: N/A   Occupational History  . Not on file.   Social History Main Topics  . Smoking status: Never Smoker   . Smokeless tobacco: Never Used  . Alcohol Use: Yes     Comment: occasionally wine  . Drug Use: No  . Sexual Activity: Yes    Birth Control/ Protection: None   Other Topics Concern  . Not on file   Social History Narrative    Patient is married and has one daughter.   Then the [place] in the [place] area on a 15 days on and 15 days off schedule.   Patient is a nonsmoker. Patient is not used smokeless tobacco.   Patient with occasional use of wine. Patient denies any other IV drug abuse.    FAMILY HISTORY: Family History  Problem Relation Age of Onset  . Diabetes Mother   . Hypertension Mother      REVIEW OF SYSTEMS: Reviewed with the patient and included the dental record.  DENTAL HISTORY: CHIEF COMPLAINT: Patient referred by Dr. Darnelle Catalan for a dental consultation.  HPI: Alyssa May is a 48 year old female with history of metastatic breast cancer currently undergoing active chemotherapy with gemcitabine and carboplatin.  Consultation was requested to rule out dental infection that may affect the patient's systemic health while undergoing active chemotherapy. Patient also seen as part of a pre-Zometa dental protocol evaluation.  The patient currently is complaining of  "cracked teeth and loose crowns ". The patient feels that the chemotherapy has" ruined her mouth". Patient also is complaining of acute toothache symptoms coming from upper left molar #16. Patient describes the pain as being intermittent in nature. Pain reaches intensity of 6/10 and is currently 0/10 in intensity today. Patient describes dull, aching pain as well as sharp pain from time to time. This tooth pain is relieved with the Percocet pain medication that she uses. This tooth has been bothering her over a period of several months.  Patient indicates that she last saw a primary dentist, Dr. Blondell Reveal, in [place] on 11/07/2012. Patient had an exam and 3 radiographs taken and was then referred to an oral surgeon for dental consultation. Patient was referred to Dr. Ocie Doyne to evaluate extraction of tooth numbers 8, 16, and 29. Patient indicates that she has an appointment on 11/27/2012 for a consultation only.  Patient  indicates that she last saw her regular primary dentist in [place] to have tooth number #8 restored with a core and then recementation of the existing crown. The crown was initially fabricated in 2001.  The Crown has fallen off several times since the initial crown was inserted. The crown has again fallen off. The patient is now interested in having the tooth extracted at this time. The patient has been unable to receive routine periodontal care due to economic concerns and recent cancer diagnosis.   DENTAL EXAMINATION:  GENERAL:  The patient is a well-developed, well-nourished female in no acute distress.  HEAD AND NECK:There is no obvious submandibular lymphadenopathy. The patient denies acute TMJ symptoms.  INTRAORAL EXAM: The patient has normal saliva. I do not see any evidence of abscess formation.  DENTITION: The patient is missing tooth numbers 1, 13, 19, 30, and 31. PERIODONTAL: The patient has chronic periodontitis with plaque and calculus accumulations, selective areas gingival recession and no significant tooth mobility. DENTAL CARIES/SUBOPTIMAL RESTORATIONS: Rampant dental caries and suboptimal dental restorations as per dental charting form.  ENDODONTIC: The patient has a history of acute pulpitis symptoms associated with tooth #16. I do not see any obvious periapical pathology or radiolucency. Patient does have a history of root canal therapy associated with tooth #8.  CROWN AND BRIDGE: The patient has a crown on tooth #9. Patient previously had a crown on tooth #8 that fractured off with the core portion leaving the post and root segment remaining. PROSTHODONTIC: The patient does not have any partial dentures. The patient indicates that Dr. Blondell Reveal is planning to fabricate a maxillary acrylic partial denture in the future after the dental extractions. OCCLUSION:The patient has a poor occlusal scheme secondary to multiple missing teeth, supra-eruption and drifting of the unopposed teeth  into the edentulous areas, and lack of replacement of all missing teeth with dental prostheses.   RADIOGRAPHIC INTERPRETATION: An orthopantogram was obtained and supplemented with a full series of dental radiographs.  There are multiple missing teeth. There is supra-eruption and drifting of the unopposed teeth into the edentulous areas. There are no obvious periapical radiolucencies. There  are rampant dental caries. Tooth #8 has previous root canal therapy and post in place. Crown #8 was fractured off and is now present as a retained root segment.   ASSESSMENTS: 1. history of acute pulpitis symptoms #16  2. Chronic periodontitis with bone loss 3. Plaque and calculus accumulations 4. Selective areas of gingival recession 5. Multiple missing teeth 6. Supra-eruption and drifting of the unopposed teeth into the edentulous areas 7. Poor occlusal scheme but stable occlusion  8. Rampant dental caries 9. History canal therapy #8. 10 Fractured off crown from tooth #8 with post in place without core portion  11. Current thrombocytopenia with risk for bleeding.  12. Current chemotherapy with Port-A-Cath in place and probable need for antibiotic premedication prior to invasive dental procedures .  PLAN/RECOMMENDATIONS: 1. I discussed the risks, benefits, and complications of various treatment options with the patient in relationship to her medical and dental conditions, current chemotherapy, possible IV bisphosphonate therapy, and the risk for infection, bleeding and possible osteonecrosis of the jaw related to IV bisphosphonate therapy and future invasive dental procedures. We discussed various treatment options to include no treatment, multiple extractions with alveoloplasty, pre-prosthetic surgery as indicated, periodontal therapy, dental restorations, root canal therapy, crown and bridge therapy, implant therapy, and replacement of missing teeth as indicated. The patient currently wishes to proceed  with referral to Dr. Barbette Merino (oral surgeon) for evaluation of extraction of tooth numbers 4, 8, 16, and 29 with alveoloplasty. Prior to surgery, Dr. Barbette Merino will need to coordinate his dental treatment with Dr. Darnelle Catalan to insure adequate levels of platelets prior to the invasive dental procedures. Patient will then followup with Dr. Blondell Reveal in [facility] for continued periodontal care, restoration of the dental caries, and replacement of missing teeth is indicated after obtaining prior approval from [facility].  The patient most likely will need antibiotic premedication prior to invasive dental procedures due to the presence of active chemotherapy and the presence of the Port-A-Cath.  2. Discussion of findings with medical team and coordination of future medical and dental care as needed.   Charlynne Pander, DDS

## 2012-11-19 ENCOUNTER — Other Ambulatory Visit: Payer: Medicaid Other | Admitting: Lab

## 2012-11-19 ENCOUNTER — Ambulatory Visit: Payer: Medicaid Other | Admitting: Family

## 2012-11-20 ENCOUNTER — Telehealth: Payer: Self-pay | Admitting: *Deleted

## 2012-11-20 ENCOUNTER — Other Ambulatory Visit: Payer: Self-pay | Admitting: *Deleted

## 2012-11-20 ENCOUNTER — Encounter: Payer: Self-pay | Admitting: Dietician

## 2012-11-20 ENCOUNTER — Ambulatory Visit: Payer: Self-pay

## 2012-11-20 NOTE — Telephone Encounter (Signed)
sw pt gv appts for 11/21/12 w/ labs @ 9am, ov@ 9:30am, and tx to follow. gv appt for inj 11/22/12 @ 8:30am. i emailed MW to adjust tx dates...td

## 2012-11-20 NOTE — Progress Notes (Signed)
Breast Cancer Nutrition Class Attendance Note  Date: 11/20/2012  Time: 1800  Pt attended [facility]'s Breast Cancer Nutrition Class, "Food For Your Fight". Pt was educated on basic cancer nutrition principles, including plant based diet and principles from [employer]  ([employer]) about latest nutrition findings and recommendations. Questions answered. Handouts and recipes provided.   Cristi Gwynn A. Kailena Lubas, RD, LDN Pager: [phone]  

## 2012-11-20 NOTE — Telephone Encounter (Signed)
Per staff message and POF I have scheduled appts.  JMW  

## 2012-11-21 ENCOUNTER — Encounter: Payer: Self-pay | Admitting: Family

## 2012-11-21 ENCOUNTER — Ambulatory Visit (HOSPITAL_BASED_OUTPATIENT_CLINIC_OR_DEPARTMENT_OTHER): Payer: Medicaid Other | Admitting: Family

## 2012-11-21 ENCOUNTER — Ambulatory Visit (HOSPITAL_BASED_OUTPATIENT_CLINIC_OR_DEPARTMENT_OTHER): Payer: Medicaid Other

## 2012-11-21 ENCOUNTER — Other Ambulatory Visit (HOSPITAL_BASED_OUTPATIENT_CLINIC_OR_DEPARTMENT_OTHER): Payer: Medicaid Other | Admitting: Lab

## 2012-11-21 VITALS — BP 103/67 | HR 78 | Temp 98.2°F | Resp 20 | Ht 65.0 in | Wt 171.1 lb

## 2012-11-21 DIAGNOSIS — C50412 Malignant neoplasm of upper-outer quadrant of left female breast: Secondary | ICD-10-CM

## 2012-11-21 DIAGNOSIS — Z5111 Encounter for antineoplastic chemotherapy: Secondary | ICD-10-CM

## 2012-11-21 DIAGNOSIS — C50419 Malignant neoplasm of upper-outer quadrant of unspecified female breast: Secondary | ICD-10-CM

## 2012-11-21 DIAGNOSIS — N6320 Unspecified lump in the left breast, unspecified quadrant: Secondary | ICD-10-CM

## 2012-11-21 DIAGNOSIS — C801 Malignant (primary) neoplasm, unspecified: Secondary | ICD-10-CM

## 2012-11-21 DIAGNOSIS — C50919 Malignant neoplasm of unspecified site of unspecified female breast: Secondary | ICD-10-CM

## 2012-11-21 DIAGNOSIS — C787 Secondary malignant neoplasm of liver and intrahepatic bile duct: Secondary | ICD-10-CM

## 2012-11-21 LAB — CBC WITH DIFFERENTIAL/PLATELET
BASO%: 0.5 % (ref 0.0–2.0)
LYMPH%: 32.7 % (ref 14.0–49.7)
MCHC: 33.3 g/dL (ref 31.5–36.0)
MONO#: 0 10*3/uL — ABNORMAL LOW (ref 0.1–0.9)
Platelets: 116 10*3/uL — ABNORMAL LOW (ref 145–400)
RBC: 3.58 10*6/uL — ABNORMAL LOW (ref 3.70–5.45)
WBC: 2.7 10*3/uL — ABNORMAL LOW (ref 3.9–10.3)
lymph#: 0.9 10*3/uL (ref 0.9–3.3)

## 2012-11-21 LAB — COMPREHENSIVE METABOLIC PANEL (CC13)
ALT: 25 U/L (ref 0–55)
AST: 28 U/L (ref 5–34)
CO2: 29 mEq/L (ref 22–29)
Sodium: 140 mEq/L (ref 136–145)
Total Bilirubin: 0.46 mg/dL (ref 0.20–1.20)
Total Protein: 8 g/dL (ref 6.4–8.3)

## 2012-11-21 MED ORDER — SODIUM CHLORIDE 0.9 % IJ SOLN
10.0000 mL | INTRAMUSCULAR | Status: DC | PRN
  Administered 2012-11-21: 10 mL
  Filled 2012-11-21: qty 10

## 2012-11-21 MED ORDER — HEPARIN SOD (PORK) LOCK FLUSH 100 UNIT/ML IV SOLN
500.0000 [IU] | Freq: Once | INTRAVENOUS | Status: AC | PRN
  Administered 2012-11-21: 500 [IU]
  Filled 2012-11-21: qty 5

## 2012-11-21 MED ORDER — DEXAMETHASONE SODIUM PHOSPHATE 10 MG/ML IJ SOLN
INTRAMUSCULAR | Status: AC
  Filled 2012-11-21: qty 1

## 2012-11-21 MED ORDER — ONDANSETRON 8 MG/50ML IVPB (CHCC)
8.0000 mg | Freq: Once | INTRAVENOUS | Status: AC
  Administered 2012-11-21: 8 mg via INTRAVENOUS

## 2012-11-21 MED ORDER — ONDANSETRON 8 MG/NS 50 ML IVPB
INTRAVENOUS | Status: AC
  Filled 2012-11-21: qty 8

## 2012-11-21 MED ORDER — SODIUM CHLORIDE 0.9 % IV SOLN
1000.0000 mg/m2 | Freq: Once | INTRAVENOUS | Status: AC
  Administered 2012-11-21: 1900 mg via INTRAVENOUS
  Filled 2012-11-21: qty 49.97

## 2012-11-21 MED ORDER — SODIUM CHLORIDE 0.9 % IV SOLN
Freq: Once | INTRAVENOUS | Status: AC
  Administered 2012-11-21: 11:00:00 via INTRAVENOUS

## 2012-11-21 MED ORDER — DEXAMETHASONE SODIUM PHOSPHATE 10 MG/ML IJ SOLN
10.0000 mg | Freq: Once | INTRAMUSCULAR | Status: AC
  Administered 2012-11-21: 10 mg via INTRAVENOUS

## 2012-11-21 MED ORDER — SODIUM CHLORIDE 0.9 % IV SOLN
272.0000 mg | Freq: Once | INTRAVENOUS | Status: AC
  Administered 2012-11-21: 270 mg via INTRAVENOUS
  Filled 2012-11-21: qty 27

## 2012-11-21 NOTE — Progress Notes (Signed)
[facility]  [phone] [phone]  OFFICE PROGRESS NOTE   ID: RABECKA BRENDEL   DOB: 1966-02-18  MR#: [id number]  ION#:[id number]  KGM:[id number] Not In System Alyssa: Manus Rudd, MD RAD ONC: Lonie Peak, MD   HISTORY OF PRESENT ILLNESS: Alyssa May is a 47 y.o. [place] woman who first noted a small mass in her left breast 01/2010, shortly after she stopped breast-feeding.  It did not disappear with resumption of her periods and grew over the next year, and particularly since she had her miscarrriage in 12/2010. This brought her to the Emergency Room 03/09/2011 where she was found to have a fluctuant area in the UOQ of the left breast measuring 8.4 cm. The tissue beneath the cystic area extending towards the axilla and medially was described as firm, but not erythematous. The cystic area was lanced and a large amount of serosanguinos material was expressed. The patient was referred to Dr. Marcille Blanco who saw her 03/11/2011. By this time the fluid had largely reaccumulated. He incised the lesion, again draining serosanguinous liquid, packed it, and set the patient up for [facility] breast [facility] 03[facility]14/2011. This showed an irregularly marginated mass measuring 7.3 cm, with abnormal axillary adenopathy. On 01/1102013 bilateral mammography confirmed a high-density mass in the left UOQ. This was biopsied, as was a left axillary lymph node. Both biopsies ([id number]) showed invasive ductal carcinoma, high grade, triple negative, with an Mib-1 of 97%.  Her subsequent history is as detailed below.  INTERVAL HISTORY: Alyssa May returns today for followup of her metastatic breast cancer.  She's currently due for day 8 cycle 4 of 4-6 planned, Qthree-week doses of Carboplatin/Gemcitabine, with both agents given on days 1 and 8.  Neulasta is given on day 9 for granulocyte support.  Since her last office visit on 11/15/2012, Alyssa May has been doing relatively  well. She was seen by Dr. Kristin Bruins, DDS on 11/16/2012 who suggested she received prophylactic amoxicillin 500 mg x 4 capsules one hour before dental procedures due to implanted Port-A-Cath.  He referred her to oral surgeon Dr. Ocie Doyne, DMD for tooth extraction.  She attended a nutritional class on 11/20/2012.  She has been tolerating chemotherapy well.   Her interval history is otherwise unremarkable.   REVIEW OF SYSTEMS: Alyssa May denies any fevers, chills, or night sweats. She denies any abnormal bleeding or bruising. She's had no mouth ulcers or oral sensitivity. She is not eating at an optimal level secondary to dental issues.  She denies any nausea and emesis.   She's had no new cough, increased shortness of breath, or chest pain. She denies any abnormal headaches, dizziness, or change in vision.  She denies any myalgias/arthralgias.  She's had no peripheral swelling or increased peripheral neuropathy.  A detailed review of systems is otherwise stable and noncontributory.   PAST MEDICAL HISTORY: Past Medical History  Diagnosis Date  . Breast abscess 03/2011  . History of bronchitis     last time [facility]-Nov 2012  . Seizures     as a toddler ,but none since then.  . Joint pain   . History of blood transfusion     in May 2013-no abnormal reaction noted  . Cancer     Left Breast    PAST SURGICAL HISTORY: Past Surgical History  Procedure Laterality Date  . Dilation and curettage of uterus  01/04/11  . Breast mass excision  2013  . Tonsillectomy    . Portacath  placement  04/12/2011    Procedure: INSERTION PORT-A-CATH;  Surgeon: Wilmon Arms. Tsuei, MD;  Location: [facility];  Service: General;  Laterality: Right;  right subclavian  . Incision and drainage breast abscess  03/31/11    FAMILY HISTORY Family History  Problem Relation Age of Onset  . Diabetes Mother   . Hypertension Mother   [name] died from post-operative complications at age 81; the patient's mother is 40,  lives in [place] The patient is an only child.   GYNECOLOGIC HISTORY: First pregnancy to term age 89. Her menstrual cycles were interrupted with chemotherapy, but resumed in 05/2012.  She has not had a menses since her menstrual cycle in 05/2012.  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 area. She has a good deal of family in this area. Her husband Onya Eutsler is a Hospital doctor for a sedan service in [place] he works there 15 days, and then returns home for another 15 days. Currently he is considering a similar job in [place], [name]. Their daughter, Alyssa May, is being home schooled.     ADVANCED DIRECTIVES: Not in place  HEALTH MAINTENANCE: History  Substance Use Topics  . Smoking status: Never Smoker   . Smokeless tobacco: Never Used  . Alcohol Use: Yes     Comment: occasionally wine     Colonoscopy:  N/A  PAP: 03/18/2011  Bone density: N/A  Lipid panel: Not on file   Allergies  Allergen Reactions  . Sulfa Antibiotics Swelling    Current Outpatient Prescriptions  Medication Sig Dispense Refill  . B Complex CAPS Take 1 capsule by mouth daily.      . Cholecalciferol (VITAMIN D3) 2000 UNITS TABS Take 1 tablet by mouth daily.      [name] GLUCOSAMINE CHONDROITIN COMPLX PO Take 1 each by mouth daily. Liquid glucosamine chondroitin      . lidocaine-prilocaine (EMLA) cream Apply 1 application topically as needed. To port  30 g  1  . LORazepam (ATIVAN) 0.5 MG tablet Take 1 tablet (0.5 mg total) by mouth 2 (two) times daily as needed for anxiety.  20 tablet  0  . metoCLOPramide (REGLAN) 10 MG tablet Take 1 tablet (10 mg total) by mouth 4 (four) times daily -  before meals and at bedtime.  120 tablet  6  . omeprazole (PRILOSEC) 20 MG capsule Take 1 capsule (20 mg total) by mouth 2 (two) times daily.  60 capsule  2  . ondansetron (ZOFRAN) 8 MG tablet Take 1 tablet (8 mg total) by mouth every 8 (eight) hours as needed for nausea.   30 tablet  1  . oxyCODONE-acetaminophen (PERCOCET/ROXICET) 5-325 MG per tablet Take 1 tablet by mouth every 8 (eight) hours as needed for pain.  90 tablet  0  . promethazine (PHENERGAN) 25 MG suppository Place 1 suppository (25 mg total) rectally every 6 (six) hours as needed for nausea.  6 each  6  . amoxicillin (AMOXIL) 500 MG capsule Take 4 capsules (2,000 mg total) by mouth once.  4 capsule  1   No current facility-administered medications for this visit.    OBJECTIVE:  Middle-aged [place] American woman in no acute distress  Filed Vitals:   11/21/12 0950  BP: 103/67  Pulse: 78  Temp: 98.2 F (36.8 C)  Resp: 20     Body mass index is 28.47 kg/(m^2).    ECOG FS: 1  Filed Weights   11/21/12 0950  Weight: 171 lb 1.6 oz (77.61 kg)    General appearance: Alert, cooperative, well nourished, no apparent distress Head: Normocephalic, without obvious abnormality, atraumatic Eyes: Conjunctivae/corneas clear, PERRLA, EOMI Nose: Nares, septum and mucosa are normal, no drainage or sinus tenderness Neck: No adenopathy, supple, symmetrical, trachea midline, no tenderness Resp: Clear to auscultation bilaterally, no wheezes/rales/rhonchi Cardio: Regular rate and rhythm, S1, S2 normal, no murmur, click, rub or gallop, no edema, right chest Port-A-Cath covered in EMLA cream and with occlusive dressing Breasts: Left breast and axilla area has fixed firm tumor mass GI: Soft, distended, non-tender, hypoactive bowel sounds, no organomegaly Skin: No rashes/lesions, skin warm and dry, no erythematous areas, no cyanosis  M/S:  Atraumatic, normal strength in all extremities, normal range of motion, no clubbing  Lymph nodes: Cervical, supraclavicular, and axillary nodes normal Neurologic: Grossly normal, cranial nerves II through XII intact, alert and oriented x 3 Psych: Appropriate affect   LAB RESULTS: Lab Results  Component Value Date   WBC 2.7* 11/21/2012   NEUTROABS 1.7 11/21/2012   HGB 10.2*  11/21/2012   HCT 30.5* 11/21/2012   MCV 85.3 11/21/2012   PLT 116* 11/21/2012       Chemistry      Component Value Date/Time   NA 140 11/21/2012 0935   NA 138 03/16/2012 1208   K 3.9 11/21/2012 0935   K 4.1 03/16/2012 1208   CL 103 07/05/2012 1448   CL 97 03/16/2012 1208   CO2 29 11/21/2012 0935   CO2 25 03/16/2012 1208   BUN 16.9 11/21/2012 0935   BUN 18 03/16/2012 1208   CREATININE 0.8 11/21/2012 0935   CREATININE 0.88 03/16/2012 1208      Component Value Date/Time   CALCIUM 9.4 11/21/2012 0935   CALCIUM 9.9 03/16/2012 1208   ALKPHOS 85 11/21/2012 0935   ALKPHOS 44 09/22/2011 1006   AST 28 11/21/2012 0935   AST 17 09/22/2011 1006   ALT 25 11/21/2012 0935   ALT 12 09/22/2011 1006   BILITOT 0.46 11/21/2012 0935   BILITOT 0.2* 09/22/2011 1006       STUDIES: No results found.   ASSESSMENT: Alyssa May is a 47 y.o.  [place], [name] woman with stage IV breast cancer (liver involvement at presentation but no bone, lung or brain spread):  (1)  Status post upper outer quadrant left breast biopsy 03/18/2011 showing a clinical T4,N1 invasive ductal carcinoma, high grade, triple negative, with an Mib-1 of 97%, with a single liver metastasis pathologically confirmed 03/31/2011.  (2) Status post 4 cycles of Qthree-week docetaxel/ doxorubicin/cyclophosphamide in the neoadjuvant setting, discontinued after 4 cycles due to peripheral neuropathy.  Status post 2 cycles of every 3 week carboplatin/gemcitabine, for a total of 6 neoadjuvant cycles altogether, completed 11/09/2011.  (3) The patient refused surgery.  (4) With subsequent tumor re-growth, started Carboplatin/Gemcitabine on 09/10/2012, with treatment on days 1 and 8 of each 21 day cycle, 4-6 cycles planned before definitive surgery.  (5) Dental problems   PLAN:  Alyssa May will proceed with chemotherapy day 8 of cycle 4 consisting of Carboplatinum/Gemcitabine.  Neulasta injection for granulocyte support is scheduled for  11/22/2012.  Once again, the plan is to proceed with 4-6 cycles of chemotherapy prior to definitive surgery. We'll continue to see her for labs and physical exam on each treatment day. She'll continue to receive Neulasta injections on day 9 of each cycle.  She is also scheduled to receive  500 mL of normal saline via IV during chemotherapy cycles  of Gemcitabine/Carboplatin infusions for dehydration.  Alyssa May is now scheduled to see oral surgeon Dr. Ocie Doyne, DMD for tooth extraction related to recent dental problems.  An electronic prescription for Amoxicillin 500 mg x 4  capsules to be taken one hour before dental procedures was sent to the patient's pharmacy.    An appointment with Dr. Darnelle Catalan has been scheduled for 11/28/2012 after her fourth cycle of chemotherapy and after restaging scans of CT of the chest with contrast and CT of the abdomen/pelvis with contrast have been obtained.  Laboratories of CBC and CMP will be checked at that time.  All questions answered.  Alyssa May was encouraged to contact us in the interim with any problems, questions, or concerns.   Larina Bras, NP-C  11/22/2012 10:34 AM

## 2012-11-21 NOTE — Patient Instructions (Addendum)
Please contact us at [phone] if you have any questions or concerns.  Please continue to enjoy life!!!  Get plenty of rest, drink plenty of water, exercise daily (walking), eat a balanced diet.  Results for orders placed in visit on 11/21/12 (from the past 24 hour(s))  CBC WITH DIFFERENTIAL     Status: Abnormal   Collection Time    11/21/12  9:34 AM      Result Value Range   WBC 2.7 (*) 3.9 - 10.3 10e3/uL   NEUT# 1.7  1.5 - 6.5 10e3/uL   HGB 10.2 (*) 11.6 - 15.9 g/dL   HCT 96.0 (*) 45.4 - 09.8 %   Platelets 116 (*) 145 - 400 10e3/uL   MCV 85.3  79.5 - 101.0 fL   MCH 28.4  25.1 - 34.0 pg   MCHC 33.3  31.5 - 36.0 g/dL   RBC 1.19 (*) 1.47 - 8.29 10e6/uL   RDW 20.8 (*) 11.2 - 14.5 %   lymph# 0.9  0.9 - 3.3 10e3/uL   MONO# 0.0 (*) 0.1 - 0.9 10e3/uL   Eosinophils Absolute 0.0  0.0 - 0.5 10e3/uL   Basophils Absolute 0.0  0.0 - 0.1 10e3/uL   NEUT% 64.1  38.4 - 76.8 %   LYMPH% 32.7  14.0 - 49.7 %   MONO% 1.6  0.0 - 14.0 %   EOS% 1.1  0.0 - 7.0 %   BASO% 0.5  0.0 - 2.0 %   Narrative:    Performed At:  [facility]               [address]               [name], [place] [address]

## 2012-11-21 NOTE — Patient Instructions (Addendum)
[facility] Discharge Instructions for Patients Receiving Chemotherapy  Today you received the following chemotherapy agents  Carboplatin and Gemzar.   To help prevent nausea and vomiting after your treatment, we encourage you to take your nausea medication as prescribed.   If you develop nausea and vomiting that is not controlled by your nausea medication, call the clinic.   BELOW ARE SYMPTOMS THAT SHOULD BE REPORTED IMMEDIATELY:  *FEVER GREATER THAN 100.5 F  *CHILLS WITH OR WITHOUT FEVER  NAUSEA AND VOMITING THAT IS NOT CONTROLLED WITH YOUR NAUSEA MEDICATION  *UNUSUAL SHORTNESS OF BREATH  *UNUSUAL BRUISING OR BLEEDING  TENDERNESS IN MOUTH AND THROAT WITH OR WITHOUT PRESENCE OF ULCERS  *URINARY PROBLEMS  *BOWEL PROBLEMS  UNUSUAL RASH Items with * indicate a potential emergency and should be followed up as soon as possible.  Feel free to call the clinic you have any questions or concerns. The clinic phone number is [phone].

## 2012-11-22 ENCOUNTER — Ambulatory Visit: Payer: Medicaid Other | Admitting: Family

## 2012-11-22 ENCOUNTER — Ambulatory Visit (HOSPITAL_BASED_OUTPATIENT_CLINIC_OR_DEPARTMENT_OTHER): Payer: Medicaid Other

## 2012-11-22 ENCOUNTER — Ambulatory Visit: Payer: Medicaid Other

## 2012-11-22 ENCOUNTER — Other Ambulatory Visit: Payer: Medicaid Other | Admitting: Lab

## 2012-11-22 VITALS — BP 123/80 | HR 89 | Temp 97.4°F

## 2012-11-22 DIAGNOSIS — Z5189 Encounter for other specified aftercare: Secondary | ICD-10-CM

## 2012-11-22 DIAGNOSIS — C50419 Malignant neoplasm of upper-outer quadrant of unspecified female breast: Secondary | ICD-10-CM

## 2012-11-22 MED ORDER — PEGFILGRASTIM INJECTION 6 MG/0.6ML
6.0000 mg | Freq: Once | SUBCUTANEOUS | Status: AC
  Administered 2012-11-22: 6 mg via SUBCUTANEOUS
  Filled 2012-11-22: qty 0.6

## 2012-11-22 MED ORDER — AMOXICILLIN 500 MG PO CAPS: 2000.0000 mg | ORAL_CAPSULE | Freq: Once | ORAL | Status: DC

## 2012-11-22 NOTE — Patient Instructions (Addendum)

## 2012-11-23 ENCOUNTER — Ambulatory Visit: Payer: Medicaid Other

## 2012-11-26 ENCOUNTER — Other Ambulatory Visit: Payer: Self-pay | Admitting: *Deleted

## 2012-11-27 ENCOUNTER — Ambulatory Visit (HOSPITAL_COMMUNITY): Payer: Medicaid Other

## 2012-11-27 ENCOUNTER — Other Ambulatory Visit: Payer: Self-pay | Admitting: *Deleted

## 2012-11-28 ENCOUNTER — Other Ambulatory Visit: Payer: Medicaid Other | Admitting: Lab

## 2012-11-28 ENCOUNTER — Telehealth: Payer: Self-pay | Admitting: Oncology

## 2012-11-28 ENCOUNTER — Ambulatory Visit: Payer: Medicaid Other | Admitting: Oncology

## 2012-11-28 NOTE — Telephone Encounter (Signed)
lmonvm for pt re new lb/fu appt for 9/30 and also that pt would need to call central to r/s scan and have it done before 9/30 lb/fu. Also called cell and was able to reach pt w/new appt info. Pt made aware she will need to call central to r/s scan and per pt she has the number.

## 2012-12-03 ENCOUNTER — Encounter (HOSPITAL_COMMUNITY): Payer: Self-pay

## 2012-12-03 ENCOUNTER — Ambulatory Visit (HOSPITAL_COMMUNITY)
Admission: RE | Admit: 2012-12-03 | Discharge: 2012-12-03 | Disposition: A | Discharge status: Home / Self Care | Payer: Medicaid Other | Source: Ambulatory Visit | Attending: Family | Admitting: Family

## 2012-12-03 DIAGNOSIS — C773 Secondary and unspecified malignant neoplasm of axilla and upper limb lymph nodes: Secondary | ICD-10-CM | POA: Insufficient documentation

## 2012-12-03 DIAGNOSIS — M948X9 Other specified disorders of cartilage, unspecified sites: Secondary | ICD-10-CM | POA: Insufficient documentation

## 2012-12-03 DIAGNOSIS — C787 Secondary malignant neoplasm of liver and intrahepatic bile duct: Secondary | ICD-10-CM | POA: Insufficient documentation

## 2012-12-03 DIAGNOSIS — C50919 Malignant neoplasm of unspecified site of unspecified female breast: Secondary | ICD-10-CM | POA: Insufficient documentation

## 2012-12-03 DIAGNOSIS — C50412 Malignant neoplasm of upper-outer quadrant of left female breast: Secondary | ICD-10-CM

## 2012-12-03 MED ORDER — IOHEXOL 300 MG/ML  SOLN
100.0000 mL | Freq: Once | INTRAMUSCULAR | Status: AC | PRN
  Administered 2012-12-03: 100 mL via INTRAVENOUS

## 2012-12-03 MED ORDER — IOHEXOL 300 MG/ML  SOLN
50.0000 mL | Freq: Once | INTRAMUSCULAR | Status: AC | PRN
  Administered 2012-12-03: 50 mL via ORAL

## 2012-12-04 ENCOUNTER — Other Ambulatory Visit (HOSPITAL_BASED_OUTPATIENT_CLINIC_OR_DEPARTMENT_OTHER): Payer: Medicaid Other

## 2012-12-04 ENCOUNTER — Ambulatory Visit (HOSPITAL_BASED_OUTPATIENT_CLINIC_OR_DEPARTMENT_OTHER): Payer: Medicaid Other | Admitting: Oncology

## 2012-12-04 VITALS — BP 115/88 | HR 93 | Temp 98.9°F | Resp 18 | Ht 65.0 in | Wt 174.0 lb

## 2012-12-04 DIAGNOSIS — C50412 Malignant neoplasm of upper-outer quadrant of left female breast: Secondary | ICD-10-CM

## 2012-12-04 DIAGNOSIS — C50419 Malignant neoplasm of upper-outer quadrant of unspecified female breast: Secondary | ICD-10-CM

## 2012-12-04 DIAGNOSIS — C787 Secondary malignant neoplasm of liver and intrahepatic bile duct: Secondary | ICD-10-CM

## 2012-12-04 DIAGNOSIS — Z171 Estrogen receptor negative status [ER-]: Secondary | ICD-10-CM

## 2012-12-04 LAB — CBC WITH DIFFERENTIAL/PLATELET
Basophils Absolute: 0 10*3/uL (ref 0.0–0.1)
Eosinophils Absolute: 0 10*3/uL (ref 0.0–0.5)
HCT: 22 % — ABNORMAL LOW (ref 34.8–46.6)
LYMPH%: 31.9 % (ref 14.0–49.7)
MCV: 86 fL (ref 79.5–101.0)
MONO#: 0.5 10*3/uL (ref 0.1–0.9)
MONO%: 12.5 % (ref 0.0–14.0)
NEUT#: 2.3 10*3/uL (ref 1.5–6.5)
NEUT%: 54.7 % (ref 38.4–76.8)
Platelets: 18 10*3/uL — ABNORMAL LOW (ref 145–400)
WBC: 4.3 10*3/uL (ref 3.9–10.3)

## 2012-12-04 LAB — TECHNOLOGIST REVIEW

## 2012-12-04 LAB — COMPREHENSIVE METABOLIC PANEL (CC13)
Alkaline Phosphatase: 99 U/L (ref 40–150)
BUN: 11.7 mg/dL (ref 7.0–26.0)
CO2: 25 mEq/L (ref 22–29)
Creatinine: 0.8 mg/dL (ref 0.6–1.1)
Glucose: 108 mg/dl (ref 70–140)
Total Bilirubin: 0.28 mg/dL (ref 0.20–1.20)
Total Protein: 7.2 g/dL (ref 6.4–8.3)

## 2012-12-04 NOTE — Progress Notes (Signed)
[facility]  [phone] [phone]) [phone]  OFFICE PROGRESS NOTE   ID: FREEDA SPIVEY   DOB: [birth year]  MR#: [id number]  ZHY#:[id number]  EXB:[id number] Not In System SU: Manus Rudd, MD RAD ONC: Lonie Peak, MD, Ocie Doyne, DDS   HISTORY OF PRESENT ILLNESS: Davis Vannatter is a 47 y.o. [place] woman who first noted a small mass in her left breast 01/2010, shortly after she stopped breast-feeding.  It did not disappear with resumption of her periods and grew over the next year, and particularly since she had her miscarrriage in 12/2010. This brought her to the Emergency Room 03/09/2011 where she was found to have a fluctuant area in the UOQ of the left breast measuring 8.4 cm. The tissue beneath the cystic area extending towards the axilla and medially was described as firm, but not erythematous. The cystic area was lanced and a large amount of serosanguinos material was expressed. The patient was referred to Dr. Marcille Blanco who saw her 03/11/2011. By this time the fluid had largely reaccumulated. He incised the lesion, again draining serosanguinous liquid, packed it, and set the patient up for [facility] breast [facility] 03[facility]14/2011. This showed an irregularly marginated mass measuring 7.3 cm, with abnormal axillary adenopathy. On 01/1102013 bilateral mammography confirmed a high-density mass in the left UOQ. This was biopsied, as was a left axillary lymph node. Both biopsies ([id number]) showed invasive ductal carcinoma, high grade, triple negative, with an Mib-1 of 97%.  Her subsequent history is as detailed below.  INTERVAL HISTORY: Rosabel returns today for followup of her metastatic breast cancer, accompanied by her husband [name] and their daughter [name]. Son and has completed 4 cycles of chemotherapy and she is here to discuss the results of her restaging studies, which are very favorable.   REVIEW OF SYSTEMS: She is tolerating chemotherapy remarkably  well. She is having more hot flashes than before. She has good energy. She has mild nausea, almost never vomits. She actually doesn't have any nausea medication at home, other than lorazepam, which of course is more of an antianxiety agents. She is concerned that the chemotherapy is affecting her teeth as she has had 2 teeth break in the near past. This would be unusual side effects from chemotherapy on I am not sure how the in ample might be changed by the agents she is receiving. Aside from this she does bruise easily and she has low platelets today, which might explain that. She had some palpitations this morning, which she attributes to stress and anxiety, but could also be due to anemia. Aside from all this a detailed review systems today was noncontributory  PAST MEDICAL HISTORY: Past Medical History  Diagnosis Date  . Breast abscess 03/2011  . History of bronchitis     last time OCt-Nov 2012  . Seizures     as a toddler ,but none since then.  . Joint pain   . History of blood transfusion     in May 2013-no abnormal reaction noted  . Cancer     Left Breast    PAST SURGICAL HISTORY: Past Surgical History  Procedure Laterality Date  . Dilation and curettage of uterus  01/04/11  . Breast mass excision  2013  . Tonsillectomy    . Portacath placement  04/12/2011    Procedure: INSERTION PORT-A-CATH;  Surgeon: Wilmon Arms. Tsuei, MD;  Location: [facility];  Service: General;  Laterality: Right;  right subclavian  . Incision and drainage breast  abscess  03/31/11    FAMILY HISTORY Family History  Problem Relation Age of Onset  . Diabetes Mother   . Hypertension Mother   [name] died from post-operative complications at age 50; the patient's mother is 49, lives in [place] The patient is an only child.   GYNECOLOGIC HISTORY: First pregnancy to term age 57. Her menstrual cycles were interrupted with chemotherapy, but resumed in 05/2012.  She has not had a menses since her  menstrual cycle in 05/2012.  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 area. She has a good deal of family in this area. Her husband Shanea Karney is a Hospital doctor for a [employer]up[employer] in [place]. He works there 15 days, and then returns home for another 15 days. Currently he is considering a similar job in [place], [name]. Their daughter, [name], is being home schooled.     ADVANCED DIRECTIVES: Not in place  HEALTH MAINTENANCE: History  Substance Use Topics  . Smoking status: Never Smoker   . Smokeless tobacco: Never Used  . Alcohol Use: Yes     Comment: occasionally wine     Colonoscopy:  N/A  PAP: 03/18/2011  Bone density: N/A  Lipid panel: Not on file   Allergies  Allergen Reactions  . Sulfa Antibiotics Swelling    Current Outpatient Prescriptions  Medication Sig Dispense Refill  . amoxicillin (AMOXIL) 500 MG capsule Take 4 capsules (2,000 mg total) by mouth once.  4 capsule  1  . B Complex CAPS Take 1 capsule by mouth daily.      . Cholecalciferol (VITAMIN D3) 2000 UNITS TABS Take 1 tablet by mouth daily.      [name] GLUCOSAMINE CHONDROITIN COMPLX PO Take 1 each by mouth daily. Liquid glucosamine chondroitin      . lidocaine-prilocaine (EMLA) cream Apply 1 application topically as needed. To port  30 g  1  . LORazepam (ATIVAN) 0.5 MG tablet Take 1 tablet (0.5 mg total) by mouth 2 (two) times daily as needed for anxiety.  20 tablet  0  . metoCLOPramide (REGLAN) 10 MG tablet Take 1 tablet (10 mg total) by mouth 4 (four) times daily -  before meals and at bedtime.  120 tablet  6  . omeprazole (PRILOSEC) 20 MG capsule Take 1 capsule (20 mg total) by mouth 2 (two) times daily.  60 capsule  2  . ondansetron (ZOFRAN) 8 MG tablet Take 1 tablet (8 mg total) by mouth every 8 (eight) hours as needed for nausea.  30 tablet  1  . oxyCODONE-acetaminophen (PERCOCET/ROXICET) 5-325 MG per tablet Take 1 tablet by mouth every 8 (eight) hours as  needed for pain.  90 tablet  0  . promethazine (PHENERGAN) 25 MG suppository Place 1 suppository (25 mg total) rectally every 6 (six) hours as needed for nausea.  6 each  6   No current facility-administered medications for this visit.    Objective: Middle-aged [place] American woman who appears stated age 60 Vitals:   12/04/12 1629  BP: 115/88  Pulse: 93  Temp: 98.9 F (37.2 C)  Resp: 18     Body mass index is 28.96 kg/(m^2).    ECOG FS: 1  Filed Weights   12/04/12 1629  Weight: 174 lb (78.926 kg)   Sclerae unicteric Oropharynx clear No cervical or supraclavicular adenopathy Lungs no rales or rhonchi Heart regular rate and rhythm Abd soft, nontender, positive bowel sounds MSK no focal spinal  tenderness, no peripheral edema Neuro: nonfocal, well oriented, pleasant affect  Breasts: the right breast is unremarkable. The left breast as a fungating mass in the upper outer quadrant which is considerably reduced from baseline. There is currently no ulceration or bruising. The mass is movable and I feel I can reach behind it is separating it from the chest wall. I do not palpate left axillary adenopathy.    LAB RESULTS: Lab Results  Component Value Date   WBC 4.3 12/04/2012   NEUTROABS 2.3 12/04/2012   HGB 7.2* 12/04/2012   HCT 22.0* 12/04/2012   MCV 86.0 12/04/2012   PLT 18* 12/04/2012       Chemistry      Component Value Date/Time   NA 140 11/21/2012 0935   NA 138 03/16/2012 1208   K 3.9 11/21/2012 0935   K 4.1 03/16/2012 1208   CL 103 07/05/2012 1448   CL 97 03/16/2012 1208   CO2 29 11/21/2012 0935   CO2 25 03/16/2012 1208   BUN 16.9 11/21/2012 0935   BUN 18 03/16/2012 1208   CREATININE 0.8 11/21/2012 0935   CREATININE 0.88 03/16/2012 1208      Component Value Date/Time   CALCIUM 9.4 11/21/2012 0935   CALCIUM 9.9 03/16/2012 1208   ALKPHOS 85 11/21/2012 0935   ALKPHOS 44 09/22/2011 1006   AST 28 11/21/2012 0935   AST 17 09/22/2011 1006   ALT 25 11/21/2012 0935   ALT 12  09/22/2011 1006   BILITOT 0.46 11/21/2012 0935   BILITOT 0.2* 09/22/2011 1006       STUDIES: Ct Chest W Contrast  12/03/2012   CLINICAL DATA:  Left breast cancer with liver metastases  EXAM: CT CHEST, ABDOMEN, AND PELVIS WITH CONTRAST  TECHNIQUE: Multidetector CT imaging of the chest, abdomen and pelvis was performed following the standard protocol during bolus administration of intravenous contrast.  CONTRAST:  50mL OMNIPAQUE IOHEXOL 300 MG/ML SOLN, OMNIPAQUE IOHEXOL 300 MG/ML SOLN  COMPARISON:  CT chest dated 09/10/2012. MRI abdomen dated 04/24/2012.  FINDINGS: CT CHEST FINDINGS  Lungs are clear. No suspicious pulmonary nodules. No pleural effusion or pneumothorax.  4.8 x 3.2 cm mass in the upper outer left breast, corresponding to known breast cancer, previously 12.1 x 6.3 cm. Associated skin thickening involving the overlying inferior left breast (series 2/image 31).  12 mm short axis left axillary nodal metastasis (series 2/ image 17), previously 3.2 cm. Additional 6 mm short axis left axillary node (series 2/ image 14) and a 9 mm short axis left supraclavicular node (series 2/ image 5), unchanged, nonspecific.  The heart is normal in size. No pericardial effusion.  Right chest port.  No suspicious mediastinal or hilar lymphadenopathy.  Very mild degenerative changes of the thoracic spine. No focal osseous lesions.  CT ABDOMEN AND PELVIS FINDINGS  7 mm subcapsular metastasis in the lateral segment left hepatic lobe (series 2/image 53), decreased. Mild focal fat/altered perfusion along the falciform ligament (series 2/image 56).  Spleen, pancreas, and adrenal glands are within normal limits.  Gallbladder is unremarkable. No intrahepatic or extrahepatic ductal dilatation.  Kidneys are within normal limits. No hydronephrosis.  No evidence of bowel obstruction.  Moderate colonic stool burden.  No evidence of abdominal aortic aneurysm.  No abdominopelvic ascites.  No suspicious abdominopelvic  lymphadenopathy.  Uterus is mildly heterogeneous. No adnexal masses.  Bladder is within normal limits.  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 Otherwise, visualized osseous structures are within  normal limits.  IMPRESSION: CT CHEST IMPRESSION  4.8 cm mass in the upper outer left breast, corresponding to known of breast cancer, decreased.  12 mm short axis left axillary nodal metastasis, decreased.  CT ABDOMEN AND PELVIS IMPRESSION  7 mm subcapsular metastasis in the lateral segment left hepatic lobe, decreased.   Electronically Signed   By: Charline Bills M.D.   On: 12/03/2012 16:57   Ct Abdomen Pelvis W Contrast  12/03/2012   CLINICAL DATA:  Left breast cancer with liver metastases  EXAM: CT CHEST, ABDOMEN, AND PELVIS WITH CONTRAST  TECHNIQUE: Multidetector CT imaging of the chest, abdomen and pelvis was performed following the standard protocol during bolus administration of intravenous contrast.  CONTRAST:  50mL OMNIPAQUE IOHEXOL 300 MG/ML SOLN, OMNIPAQUE IOHEXOL 300 MG/ML SOLN  COMPARISON:  CT chest dated 09/10/2012. MRI abdomen dated 04/24/2012.  FINDINGS: CT CHEST FINDINGS  Lungs are clear. No suspicious pulmonary nodules. No pleural effusion or pneumothorax.  4.8 x 3.2 cm mass in the upper outer left breast, corresponding to known breast cancer, previously 12.1 x 6.3 cm. Associated skin thickening involving the overlying inferior left breast (series 2/image 31).  12 mm short axis left axillary nodal metastasis (series 2/ image 17), previously 3.2 cm. Additional 6 mm short axis left axillary node (series 2/ image 14) and a 9 mm short axis left supraclavicular node (series 2/ image 5), unchanged, nonspecific.  The heart is normal in size. No pericardial effusion.  Right chest port.  No suspicious mediastinal or hilar lymphadenopathy.  Very mild degenerative changes of the thoracic spine. No focal osseous lesions.  CT  ABDOMEN AND PELVIS FINDINGS  7 mm subcapsular metastasis in the lateral segment left hepatic lobe (series 2/image 53), decreased. Mild focal fat/altered perfusion along the falciform ligament (series 2/image 56).  Spleen, pancreas, and adrenal glands are within normal limits.  Gallbladder is unremarkable. No intrahepatic or extrahepatic ductal dilatation.  Kidneys are within normal limits. No hydronephrosis.  No evidence of bowel obstruction.  Moderate colonic stool burden.  No evidence of abdominal aortic aneurysm.  No abdominopelvic ascites.  No suspicious abdominopelvic lymphadenopathy.  Uterus is mildly heterogeneous. No adnexal masses.  Bladder is within normal limits.  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 Otherwise, visualized osseous structures are within normal limits.  IMPRESSION: CT CHEST IMPRESSION  4.8 cm mass in the upper outer left breast, corresponding to known of breast cancer, decreased.  12 mm short axis left axillary nodal metastasis, decreased.  CT ABDOMEN AND PELVIS IMPRESSION  7 mm subcapsular metastasis in the lateral segment left hepatic lobe, decreased.   Electronically Signed   By: Charline Bills M.D.   On: 12/03/2012 16:57     ASSESSMENT: Mrs. Su Hilt is a 47 y.o.  [place], [name] woman with stage IV breast cancer (liver involvement at presentation but no bone, lung or brain spread):  (1)  Status post upper outer quadrant left breast biopsy 03/18/2011 showing a clinical T4,N1 invasive ductal carcinoma, high grade, triple negative, with an Mib-1 of 97%, with a single liver metastasis pathologically confirmed 03/31/2011.  (2) Status post 4 cycles of Qthree-week docetaxel/ doxorubicin/cyclophosphamide in the neoadjuvant setting, discontinued after 4 cycles due to peripheral neuropathy.  Status post 2 cycles of every 3 week carboplatin/gemcitabine, for a total of 6 neoadjuvant cycles  altogether, completed 11/09/2011.  (3) The patient refused surgery   (4) With subsequent tumor re-growth, started Carboplatin/Gemcitabine on 09/10/2012, with treatment on  days 1 and 8 of each 21 day cycle, 6-8 cycles planned before definitive surgery.  (5) Dental problems with extractions planned for 12/13/2012   PLAN:  I spent upwards of 40 minutes this was saying the results of the staging studies with the patient and her husband, and going over plans. I am delighted with the results of her restaging scans, which show her tumor to have shrunk to almost a third of its prior size. The liver lesion also has decreased. We are going to do 2 more cycles and then reassess, but the patient may choose to proceed to surgery at that point, which would likely be  the first week in December. She is considering bilateral mastectomies. I am making her an appointment with Dr. Corliss Skains to begin operationalizing these decisions.  On the other hand after 2 more cycles we will be restaging her and there is also the option of proceeding to yet more chemotherapy before her definitive surgery.   She is planning to be in [place] for the Jewish holidays and she also has her daughter's birthday coming up which she would prefer to celebrate in [place]. We went ahead and schedule her chemotherapy through October and November together with visits each of the treatment days. In addition I will try to get her a transfusion tomorrow, although it is late in the day today and it is not clear that there will be any openings. She will be out of town the following day so  If we cannot do it tomorrow then it will have to wait or she will have to have it done in [place].  She is already scheduled to have oral surgery October 9. She is going to return October 8 for lab work here just to make sure her platelets have recovered sufficiently. Her next chemotherapy will be October 13.  Evangela has a very good understanding of the overall plan  she and her husband both are very much in agreement with proceeding. She knows to call for any problems that may develop before her next visit here.   Lowella Dell, MD  12/04/2012 4:49 PM

## 2012-12-05 ENCOUNTER — Ambulatory Visit: Payer: Medicaid Other | Admitting: Lab

## 2012-12-05 ENCOUNTER — Ambulatory Visit (HOSPITAL_BASED_OUTPATIENT_CLINIC_OR_DEPARTMENT_OTHER): Payer: Medicaid Other

## 2012-12-05 ENCOUNTER — Other Ambulatory Visit: Payer: Self-pay | Admitting: *Deleted

## 2012-12-05 ENCOUNTER — Ambulatory Visit (HOSPITAL_COMMUNITY)
Admission: RE | Admit: 2012-12-05 | Discharge: 2012-12-05 | Disposition: A | Discharge status: Home / Self Care | Payer: Medicaid Other | Source: Ambulatory Visit | Attending: Oncology | Admitting: Oncology

## 2012-12-05 ENCOUNTER — Other Ambulatory Visit: Payer: Self-pay | Admitting: Oncology

## 2012-12-05 ENCOUNTER — Telehealth: Payer: Self-pay | Admitting: *Deleted

## 2012-12-05 VITALS — BP 101/61 | HR 77 | Temp 98.2°F | Resp 20

## 2012-12-05 DIAGNOSIS — D649 Anemia, unspecified: Secondary | ICD-10-CM

## 2012-12-05 DIAGNOSIS — C50419 Malignant neoplasm of upper-outer quadrant of unspecified female breast: Secondary | ICD-10-CM | POA: Insufficient documentation

## 2012-12-05 LAB — HOLD TUBE, BLOOD BANK

## 2012-12-05 LAB — PREPARE RBC (CROSSMATCH)

## 2012-12-05 MED ORDER — HEPARIN SOD (PORK) LOCK FLUSH 100 UNIT/ML IV SOLN
500.0000 [IU] | Freq: Every day | INTRAVENOUS | Status: AC | PRN
  Administered 2012-12-05: 500 [IU]
  Filled 2012-12-05: qty 5

## 2012-12-05 MED ORDER — SODIUM CHLORIDE 0.9 % IV SOLN
250.0000 mL | Freq: Once | INTRAVENOUS | Status: AC
  Administered 2012-12-05: 250 mL via INTRAVENOUS

## 2012-12-05 MED ORDER — SODIUM CHLORIDE 0.9 % IJ SOLN
10.0000 mL | INTRAMUSCULAR | Status: AC | PRN
  Administered 2012-12-05: 10 mL
  Filled 2012-12-05: qty 10

## 2012-12-05 NOTE — Patient Instructions (Addendum)
Blood Transfusion  A blood transfusion replaces your blood or some of its parts. Blood is replaced when you have lost blood because of surgery, an accident, or for severe blood conditions like anemia. You can donate blood to be used on yourself if you have a planned surgery. If you lose blood during that surgery, your own blood can be given back to you. Any blood given to you is checked to make sure it matches your blood type. Your temperature, blood pressure, and heart rate (vital signs) will be checked often.  GET HELP RIGHT AWAY IF:   You feel sick to your stomach (nauseous) or throw up (vomit).  You have watery poop (diarrhea).  You have shortness of breath or trouble breathing.  You have blood in your pee (urine) or have dark colored pee.  You have chest pain or tightness.  Your eyes or skin turn yellow (jaundice).  You have a temperature by mouth above 102 F (38.9 C), not controlled by medicine.  You start to shake and have chills.  You develop a a red rash (hives) or feel itchy.  You develop lightheadedness or feel confused.  You develop back, joint, or muscle pain.  You do not feel hungry (lost appetite).  You feel tired, restless, or nervous.  You develop belly (abdominal) cramps. Document Released: 05/20/2008 Document Revised: 05/16/2011 Document Reviewed: 05/20/2008 Exit[employer] Patient Information 2014 [employer].  

## 2012-12-05 NOTE — Telephone Encounter (Signed)
appts are made and printed. Pt is aware that tx will follow her visits. i emailed MW to add the tx's...td

## 2012-12-06 ENCOUNTER — Telehealth: Payer: Self-pay | Admitting: *Deleted

## 2012-12-06 LAB — TYPE AND SCREEN
Antibody Screen: NEGATIVE
Unit division: 0

## 2012-12-06 NOTE — Telephone Encounter (Signed)
Per staff message and POF I have scheduled appts.  JMW  

## 2012-12-07 ENCOUNTER — Ambulatory Visit: Payer: Self-pay

## 2012-12-11 ENCOUNTER — Ambulatory Visit (INDEPENDENT_AMBULATORY_CARE_PROVIDER_SITE_OTHER): Payer: Medicaid Other | Admitting: Surgery

## 2012-12-17 ENCOUNTER — Ambulatory Visit (HOSPITAL_BASED_OUTPATIENT_CLINIC_OR_DEPARTMENT_OTHER): Payer: Medicaid Other | Admitting: Physician Assistant

## 2012-12-17 ENCOUNTER — Ambulatory Visit (HOSPITAL_BASED_OUTPATIENT_CLINIC_OR_DEPARTMENT_OTHER): Payer: Medicaid Other

## 2012-12-17 ENCOUNTER — Telehealth: Payer: Self-pay | Admitting: *Deleted

## 2012-12-17 ENCOUNTER — Telehealth: Payer: Self-pay | Admitting: Oncology

## 2012-12-17 ENCOUNTER — Encounter: Payer: Self-pay | Admitting: Physician Assistant

## 2012-12-17 ENCOUNTER — Encounter: Payer: Self-pay | Admitting: Oncology

## 2012-12-17 ENCOUNTER — Other Ambulatory Visit (HOSPITAL_BASED_OUTPATIENT_CLINIC_OR_DEPARTMENT_OTHER): Payer: Medicaid Other | Admitting: Lab

## 2012-12-17 VITALS — BP 104/71 | HR 74 | Temp 98.5°F | Resp 20 | Ht 65.0 in | Wt 171.7 lb

## 2012-12-17 DIAGNOSIS — C787 Secondary malignant neoplasm of liver and intrahepatic bile duct: Secondary | ICD-10-CM

## 2012-12-17 DIAGNOSIS — C50412 Malignant neoplasm of upper-outer quadrant of left female breast: Secondary | ICD-10-CM

## 2012-12-17 DIAGNOSIS — C50419 Malignant neoplasm of upper-outer quadrant of unspecified female breast: Secondary | ICD-10-CM

## 2012-12-17 DIAGNOSIS — F411 Generalized anxiety disorder: Secondary | ICD-10-CM

## 2012-12-17 DIAGNOSIS — C50919 Malignant neoplasm of unspecified site of unspecified female breast: Secondary | ICD-10-CM | POA: Insufficient documentation

## 2012-12-17 DIAGNOSIS — Z5111 Encounter for antineoplastic chemotherapy: Secondary | ICD-10-CM

## 2012-12-17 DIAGNOSIS — D649 Anemia, unspecified: Secondary | ICD-10-CM

## 2012-12-17 DIAGNOSIS — Z171 Estrogen receptor negative status [ER-]: Secondary | ICD-10-CM

## 2012-12-17 LAB — CBC WITH DIFFERENTIAL/PLATELET
Basophils Absolute: 0 10*3/uL (ref 0.0–0.1)
Eosinophils Absolute: 0.1 10*3/uL (ref 0.0–0.5)
HCT: 32.1 % — ABNORMAL LOW (ref 34.8–46.6)
HGB: 10.6 g/dL — ABNORMAL LOW (ref 11.6–15.9)
LYMPH%: 29.6 % (ref 14.0–49.7)
MCV: 87.5 fL (ref 79.5–101.0)
MONO%: 14.7 % — ABNORMAL HIGH (ref 0.0–14.0)
NEUT#: 2.1 10*3/uL (ref 1.5–6.5)
Platelets: 286 10*3/uL (ref 145–400)
RDW: 23.5 % — ABNORMAL HIGH (ref 11.2–14.5)
WBC: 3.9 10*3/uL (ref 3.9–10.3)

## 2012-12-17 LAB — COMPREHENSIVE METABOLIC PANEL (CC13)
AST: 20 U/L (ref 5–34)
Alkaline Phosphatase: 69 U/L (ref 40–150)
Anion Gap: 8 mEq/L (ref 3–11)
BUN: 11.6 mg/dL (ref 7.0–26.0)
Calcium: 8.9 mg/dL (ref 8.4–10.4)
Chloride: 105 mEq/L (ref 98–109)
Creatinine: 0.8 mg/dL (ref 0.6–1.1)
Potassium: 4.1 mEq/L (ref 3.5–5.1)
Total Bilirubin: 0.35 mg/dL (ref 0.20–1.20)

## 2012-12-17 MED ORDER — DEXAMETHASONE SODIUM PHOSPHATE 10 MG/ML IJ SOLN
INTRAMUSCULAR | Status: AC
  Filled 2012-12-17: qty 1

## 2012-12-17 MED ORDER — SODIUM CHLORIDE 0.9 % IV SOLN
Freq: Once | INTRAVENOUS | Status: AC
  Administered 2012-12-17: 12:00:00 via INTRAVENOUS

## 2012-12-17 MED ORDER — SODIUM CHLORIDE 0.9 % IV SOLN
272.0000 mg | Freq: Once | INTRAVENOUS | Status: AC
  Administered 2012-12-17: 270 mg via INTRAVENOUS
  Filled 2012-12-17: qty 27

## 2012-12-17 MED ORDER — SODIUM CHLORIDE 0.9 % IV SOLN
1000.0000 mg/m2 | Freq: Once | INTRAVENOUS | Status: AC
  Administered 2012-12-17: 1900 mg via INTRAVENOUS
  Filled 2012-12-17: qty 49.97

## 2012-12-17 MED ORDER — ONDANSETRON 8 MG/50ML IVPB (CHCC)
8.0000 mg | Freq: Once | INTRAVENOUS | Status: AC
  Administered 2012-12-17: 8 mg via INTRAVENOUS

## 2012-12-17 MED ORDER — SODIUM CHLORIDE 0.9 % IJ SOLN
10.0000 mL | INTRAMUSCULAR | Status: DC | PRN
  Administered 2012-12-17: 10 mL
  Filled 2012-12-17: qty 10

## 2012-12-17 MED ORDER — HEPARIN SOD (PORK) LOCK FLUSH 100 UNIT/ML IV SOLN
500.0000 [IU] | Freq: Once | INTRAVENOUS | Status: AC | PRN
  Administered 2012-12-17: 500 [IU]
  Filled 2012-12-17: qty 5

## 2012-12-17 MED ORDER — DEXAMETHASONE SODIUM PHOSPHATE 10 MG/ML IJ SOLN
10.0000 mg | Freq: Once | INTRAMUSCULAR | Status: AC
  Administered 2012-12-17: 10 mg via INTRAVENOUS

## 2012-12-17 MED ORDER — ONDANSETRON 8 MG/NS 50 ML IVPB
INTRAVENOUS | Status: AC
  Filled 2012-12-17: qty 8

## 2012-12-17 NOTE — Telephone Encounter (Signed)
, °

## 2012-12-17 NOTE — Telephone Encounter (Signed)
Per staff message and POF I have scheduled appts.  JMW  

## 2012-12-17 NOTE — Progress Notes (Signed)
[facility]  [phone] Fax:([phone]) [phone]  OFFICE PROGRESS NOTE   ID: Alyssa May   DOB: 1966/03/03  MR#: [id number]  JYN#:[id number]  QMV:HQIONGEX Not In System SU: Alyssa Rudd, MD RAD ONC: Lonie Peak, MD, Ocie Doyne, DDS  CHIEF COMPLAINT:  Metastatic Breast Cancer   HISTORY OF PRESENT ILLNESS: Alyssa May is a 47 y.o. [place] woman who first noted a small mass in her left breast 01/2010, shortly after she stopped breast-feeding.  It did not disappear with resumption of her periods and grew over the next year, and particularly since she had her miscarrriage in 12/2010. This brought her to the Emergency Room 03/09/2011 where she was found to have a fluctuant area in the UOQ of the left breast measuring 8.4 cm. The tissue beneath the cystic area extending towards the axilla and medially was described as firm, but not erythematous. The cystic area was lanced and a large amount of serosanguinos material was expressed. The patient was referred to Dr. Marcille May who saw her 03/11/2011. By this time the fluid had largely reaccumulated. He incised the lesion, again draining serosanguinous liquid, packed it, and set the patient up for [facility] breast [facility] 03/14/2011. This showed an irregularly marginated mass measuring 7.3 cm, with abnormal axillary adenopathy. On 01/1102013 bilateral mammography confirmed a high-density mass in the left UOQ. This was biopsied, as was a left axillary lymph node. Both biopsies ([id number]) showed invasive ductal carcinoma, high grade, triple negative, with an Mib-1 of 97%.    Her subsequent history is as detailed below.  INTERVAL HISTORY: Alyssa May returns alone today for followup of her metastatic breast cancer.  Restaging scans after 4 cycles of carboplatin/gemcitabine more favorable, and she is now ready to initiate day 1 cycle 5 of therapy. She receives both agents on day one and 8 of each 21 day cycle. She has  been tolerating treatment well.  Alyssa May's biggest complaint is some dental issues. She recently had 3 teeth extracted, but has healed well with no complications. She attributes this dental decay to her chemotherapy.   Her only other complaint today is some pain in the left breast, around the area of the tumor. She feels like it might be a little larger than it was a few weeks ago. (She has not received chemotherapy since 11/21/2012 due to a trip out of town.)   REVIEW OF SYSTEMS: Alyssa May has had no illnesses and denies any fevers, chills, or hot flashes. Her energy level is good. She's had no rashes or skin changes and denies any abnormal bleeding. She occasionally has nausea, but no emesis, and is having regular bowel movements. She's had no increased cough, shortness of breath, chest pain, or palpitations. She does have some mild anxiety. She denies any abnormal headaches, dizziness, or change in vision, and also denies any unusual myalgias, arthralgias, or bony pain other than occasional "achiness" in her joints, especially in the upper extremities. She's had no peripheral neuropathy. She denies any peripheral swelling.  Aside from all this a detailed review systems today was noncontributory.  PAST MEDICAL HISTORY: Past Medical History  Diagnosis Date  . Breast abscess 03/2011  . History of bronchitis     last time OCt-Nov 2012  . Seizures     as a toddler ,but none since then.  . Joint pain   . History of blood transfusion     in May 2013-no abnormal reaction noted  . Cancer  Left Breast    PAST SURGICAL HISTORY: Past Surgical History  Procedure Laterality Date  . Dilation and curettage of uterus  01/04/11  . Breast mass excision  2013  . Tonsillectomy    . Portacath placement  04/12/2011    Procedure: INSERTION PORT-A-CATH;  Surgeon: Wilmon Arms. Tsuei, MD;  Location: [facility];  Service: General;  Laterality: Right;  right subclavian  . Incision and drainage breast abscess  03/31/11     FAMILY HISTORY Family History  Problem Relation Age of Onset  . Diabetes Mother   . Hypertension Mother   [name] died from post-operative complications at age 37; the patient's mother is 48, lives in [place] The patient is an only child.   GYNECOLOGIC HISTORY: First pregnancy to term age 17. Her menstrual cycles were interrupted with chemotherapy, but resumed in 05/2012.  She has not had a menses since her menstrual cycle in 05/2012.  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 area. She has a good deal of family in this area. Her husband Alyssa May is a Hospital doctor for a [employer] in [employer]. He works there 15 days, and then returns home for another 15 days. Currently he is considering a similar job in [place], [name]. Their daughter, Alyssa May, is being home schooled.     ADVANCED DIRECTIVES: Not in place  HEALTH MAINTENANCE: History  Substance Use Topics  . Smoking status: Never Smoker   . Smokeless tobacco: Never Used  . Alcohol Use: Yes     Comment: occasionally wine     Colonoscopy:  N/A  PAP: 03/18/2011  Bone density: N/A  Lipid panel: Not on file   Allergies  Allergen Reactions  . Sulfa Antibiotics Swelling    Current Outpatient Prescriptions  Medication Sig Dispense Refill  . amoxicillin (AMOXIL) 500 MG capsule Take 4 capsules (2,000 mg total) by mouth once.  4 capsule  1  . B Complex CAPS Take 1 capsule by mouth daily.      . Cholecalciferol (VITAMIN D3) 2000 UNITS TABS Take 1 tablet by mouth daily.      [name] GLUCOSAMINE CHONDROITIN COMPLX PO Take 1 each by mouth daily. Liquid glucosamine chondroitin      . lidocaine-prilocaine (EMLA) cream Apply 1 application topically as needed. To port  30 g  1  . LORazepam (ATIVAN) 0.5 MG tablet Take 1 tablet (0.5 mg total) by mouth 2 (two) times daily as needed for anxiety.  20 tablet  0  . metoCLOPramide (REGLAN) 10 MG tablet Take 1 tablet (10 mg  total) by mouth 4 (four) times daily -  before meals and at bedtime.  120 tablet  6  . omeprazole (PRILOSEC) 20 MG capsule Take 1 capsule (20 mg total) by mouth 2 (two) times daily.  60 capsule  2  . ondansetron (ZOFRAN) 8 MG tablet Take 1 tablet (8 mg total) by mouth every 8 (eight) hours as needed for nausea.  30 tablet  1  . oxyCODONE-acetaminophen (PERCOCET/ROXICET) 5-325 MG per tablet Take 1 tablet by mouth every 8 (eight) hours as needed for pain.  90 tablet  0  . promethazine (PHENERGAN) 25 MG suppository Place 1 suppository (25 mg total) rectally every 6 (six) hours as needed for nausea.  6 each  6   No current facility-administered medications for this visit.    Objective: Middle-aged [place] American woman who appears stated age 18 Vitals:   12/17/12 1021  BP:  104/71  Pulse: 74  Temp: 98.5 F (36.9 C)  Resp: 20     Body mass index is 28.57 kg/(m^2).    ECOG FS: 1 Filed Weights   12/17/12 1021  Weight: 171 lb 11.2 oz (77.883 kg)   Physical Exam: HEENT:  Sclerae anicteric.  Oropharynx clear. No lacerations and no evidence of candidiasis. Extraction sites are healing well. NODES:  No cervical or supraclavicular lymphadenopathy palpated.  BREAST EXAM:  There is still a fungating mass in the upper outer quadrant of the left breast. The palpable mass measures approximately 3-3.5 cm on exam today.  Axillae are benign bilaterally, with no palpable adenopathy. LUNGS:  Clear to auscultation bilaterally.  No wheezes or rhonchi HEART:  Regular rate and rhythm. No murmur  ABDOMEN:  Soft, nontender.  Positive bowel sounds.  MSK:  No focal spinal tenderness to palpation. Full range of motion in the upper extremities. EXTREMITIES:  No peripheral edema.   NEURO:  Nonfocal. Well oriented.  Positive affect.   LAB RESULTS: Lab Results  Component Value Date   WBC 3.9 12/17/2012   NEUTROABS 2.1 12/17/2012   HGB 10.6* 12/17/2012   HCT 32.1* 12/17/2012   MCV 87.5 12/17/2012   PLT 286  12/17/2012       Chemistry      Component Value Date/Time   NA 141 12/17/2012 1010   NA 138 03/16/2012 1208   K 4.1 12/17/2012 1010   K 4.1 03/16/2012 1208   CL 103 07/05/2012 1448   CL 97 03/16/2012 1208   CO2 29 12/17/2012 1010   CO2 25 03/16/2012 1208   BUN 11.6 12/17/2012 1010   BUN 18 03/16/2012 1208   CREATININE 0.8 12/17/2012 1010   CREATININE 0.88 03/16/2012 1208      Component Value Date/Time   CALCIUM 8.9 12/17/2012 1010   CALCIUM 9.9 03/16/2012 1208   ALKPHOS 69 12/17/2012 1010   ALKPHOS 44 09/22/2011 1006   AST 20 12/17/2012 1010   AST 17 09/22/2011 1006   ALT 11 12/17/2012 1010   ALT 12 09/22/2011 1006   BILITOT 0.35 12/17/2012 1010   BILITOT 0.2* 09/22/2011 1006       STUDIES: Ct Chest W Contrast  12/03/2012   CLINICAL DATA:  Left breast cancer with liver metastases  EXAM: CT CHEST, ABDOMEN, AND PELVIS WITH CONTRAST  TECHNIQUE: Multidetector CT imaging of the chest, abdomen and pelvis was performed following the standard protocol during bolus administration of intravenous contrast.  CONTRAST:  50mL OMNIPAQUE IOHEXOL 300 MG/ML SOLN, OMNIPAQUE IOHEXOL 300 MG/ML SOLN  COMPARISON:  CT chest dated 09/10/2012. MRI abdomen dated 04/24/2012.  FINDINGS: CT CHEST FINDINGS  Lungs are clear. No suspicious pulmonary nodules. No pleural effusion or pneumothorax.  4.8 x 3.2 cm mass in the upper outer left breast, corresponding to known breast cancer, previously 12.1 x 6.3 cm. Associated skin thickening involving the overlying inferior left breast (series 2/image 31).  12 mm short axis left axillary nodal metastasis (series 2/ image 17), previously 3.2 cm. Additional 6 mm short axis left axillary node (series 2/ image 14) and a 9 mm short axis left supraclavicular node (series 2/ image 5), unchanged, nonspecific.  The heart is normal in size. No pericardial effusion.  Right chest port.  No suspicious mediastinal or hilar lymphadenopathy.  Very mild degenerative changes of the thoracic  spine. No focal osseous lesions.  CT ABDOMEN AND PELVIS FINDINGS  7 mm subcapsular metastasis in the lateral segment left hepatic lobe (series 2/image  53), decreased. Mild focal fat/altered perfusion along the falciform ligament (series 2/image 56).  Spleen, pancreas, and adrenal glands are within normal limits.  Gallbladder is unremarkable. No intrahepatic or extrahepatic ductal dilatation.  Kidneys are within normal limits. No hydronephrosis.  No evidence of bowel obstruction.  Moderate colonic stool burden.  No evidence of abdominal aortic aneurysm.  No abdominopelvic ascites.  No suspicious abdominopelvic lymphadenopathy.  Uterus is mildly heterogeneous. No adnexal masses.  Bladder is within normal limits.  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 Otherwise, visualized osseous structures are within normal limits.  IMPRESSION: CT CHEST IMPRESSION  4.8 cm mass in the upper outer left breast, corresponding to known of breast cancer, decreased.  12 mm short axis left axillary nodal metastasis, decreased.  CT ABDOMEN AND PELVIS IMPRESSION  7 mm subcapsular metastasis in the lateral segment left hepatic lobe, decreased.   Electronically Signed   By: Charline Bills M.D.   On: 12/03/2012 16:57     ASSESSMENT: Mrs. Su Hilt is a 47 y.o.  [place], [name] woman with stage IV breast cancer (liver involvement at presentation but no bone, lung or brain spread):  (1)  Status post upper outer quadrant left breast biopsy 03/18/2011 showing a clinical T4,N1 invasive ductal carcinoma, high grade, triple negative, with an Mib-1 of 97%, with a single liver metastasis pathologically confirmed 03/31/2011.  (2) Status post 4 cycles of Qthree-week docetaxel/ doxorubicin/cyclophosphamide in the neoadjuvant setting, discontinued after 4 cycles due to peripheral neuropathy.  Status post 2 cycles of every 3 week carboplatin/gemcitabine, for  a total of 6 neoadjuvant cycles altogether, completed 11/09/2011.  (3) The patient refused surgery   (4) With subsequent tumor re-growth, started Carboplatin/Gemcitabine on 09/10/2012, with treatment on days 1 and 8 of each 21 day cycle, 6-8 cycles planned before definitive surgery.  (5) Dental problems with extractions on 12/13/2012, healing well with no complications    PLAN:  Cassadie will proceed to treatment today as scheduled for day 1 cycle 5 of carboplatin/gemcitabine. She plans to travel to [place] this coming weekend, and would like to move her appointments from October 20 to October 21. Sondi  spshe would also like to schedule cycle 6 on Wednesdays, and accordingly will receive day 1 cycle 6 on November 5. We have scheduled all of her appointments accordingly, with labs and physics on day one and 8 of each cycle. She will have restaging scans after the completion of cycle 6 to include CTs of the chest/abdomen/pelvis on approximately November 14. She'll see Dr. Darnelle Catalan soon thereafter to assess her response and decide whether or not to proceed with surgery or to proceed with 2 additional cycles of chemotherapy for a total of 8.  Arraya voices her understanding and agreement of the overall plan. She knows to call for any problems that may develop before her next visit here.   Jayshawn Colston, PA-C  12/17/2012 5:48 PM

## 2012-12-17 NOTE — Patient Instructions (Signed)
[facility] Discharge Instructions for Patients Receiving Chemotherapy  Today you received the following chemotherapy agents :  Gemzar, Carboplatin.  To help prevent nausea and vomiting after your treatment, we encourage you to take your nausea medication as instructed by your physician.   If you develop nausea and vomiting that is not controlled by your nausea medication, call the clinic.   BELOW ARE SYMPTOMS THAT SHOULD BE REPORTED IMMEDIATELY:  *FEVER GREATER THAN 100.5 F  *CHILLS WITH OR WITHOUT FEVER  NAUSEA AND VOMITING THAT IS NOT CONTROLLED WITH YOUR NAUSEA MEDICATION  *UNUSUAL SHORTNESS OF BREATH  *UNUSUAL BRUISING OR BLEEDING  TENDERNESS IN MOUTH AND THROAT WITH OR WITHOUT PRESENCE OF ULCERS  *URINARY PROBLEMS  *BOWEL PROBLEMS  UNUSUAL RASH Items with * indicate a potential emergency and should be followed up as soon as possible.  Feel free to call the clinic you have any questions or concerns. The clinic phone number is [phone].    

## 2012-12-24 ENCOUNTER — Other Ambulatory Visit: Payer: Self-pay | Admitting: Lab

## 2012-12-24 ENCOUNTER — Ambulatory Visit: Payer: Self-pay

## 2012-12-24 ENCOUNTER — Ambulatory Visit: Payer: Self-pay | Admitting: Family

## 2012-12-25 ENCOUNTER — Encounter: Payer: Self-pay | Admitting: Family

## 2012-12-25 ENCOUNTER — Other Ambulatory Visit (HOSPITAL_BASED_OUTPATIENT_CLINIC_OR_DEPARTMENT_OTHER): Payer: Medicaid Other | Admitting: Lab

## 2012-12-25 ENCOUNTER — Ambulatory Visit (HOSPITAL_BASED_OUTPATIENT_CLINIC_OR_DEPARTMENT_OTHER): Payer: Medicaid Other | Admitting: Family

## 2012-12-25 ENCOUNTER — Ambulatory Visit (HOSPITAL_BASED_OUTPATIENT_CLINIC_OR_DEPARTMENT_OTHER): Payer: Medicaid Other

## 2012-12-25 ENCOUNTER — Telehealth: Payer: Self-pay | Admitting: Family

## 2012-12-25 VITALS — BP 109/71 | HR 80 | Temp 97.9°F | Resp 18 | Ht 65.0 in | Wt 172.6 lb

## 2012-12-25 DIAGNOSIS — C50419 Malignant neoplasm of upper-outer quadrant of unspecified female breast: Secondary | ICD-10-CM

## 2012-12-25 DIAGNOSIS — C787 Secondary malignant neoplasm of liver and intrahepatic bile duct: Secondary | ICD-10-CM

## 2012-12-25 DIAGNOSIS — Z171 Estrogen receptor negative status [ER-]: Secondary | ICD-10-CM

## 2012-12-25 DIAGNOSIS — C50412 Malignant neoplasm of upper-outer quadrant of left female breast: Secondary | ICD-10-CM

## 2012-12-25 DIAGNOSIS — D649 Anemia, unspecified: Secondary | ICD-10-CM

## 2012-12-25 DIAGNOSIS — Z5111 Encounter for antineoplastic chemotherapy: Secondary | ICD-10-CM

## 2012-12-25 LAB — COMPREHENSIVE METABOLIC PANEL (CC13)
AST: 23 U/L (ref 5–34)
Albumin: 3.6 g/dL (ref 3.5–5.0)
Anion Gap: 9 mEq/L (ref 3–11)
BUN: 12.3 mg/dL (ref 7.0–26.0)
CO2: 26 mEq/L (ref 22–29)
Calcium: 8.8 mg/dL (ref 8.4–10.4)
Chloride: 103 mEq/L (ref 98–109)
Creatinine: 0.8 mg/dL (ref 0.6–1.1)
Potassium: 3.8 mEq/L (ref 3.5–5.1)
Total Bilirubin: 0.38 mg/dL (ref 0.20–1.20)

## 2012-12-25 LAB — CBC WITH DIFFERENTIAL/PLATELET
BASO%: 0.8 % (ref 0.0–2.0)
Basophils Absolute: 0 10*3/uL (ref 0.0–0.1)
HCT: 30.2 % — ABNORMAL LOW (ref 34.8–46.6)
LYMPH%: 54.4 % — ABNORMAL HIGH (ref 14.0–49.7)
MCHC: 32.5 g/dL (ref 31.5–36.0)
MCV: 88 fL (ref 79.5–101.0)
MONO#: 0.1 10*3/uL (ref 0.1–0.9)
MONO%: 2.8 % (ref 0.0–14.0)
NEUT%: 41.2 % (ref 38.4–76.8)
Platelets: 153 10*3/uL (ref 145–400)
RBC: 3.43 10*6/uL — ABNORMAL LOW (ref 3.70–5.45)
WBC: 2.5 10*3/uL — ABNORMAL LOW (ref 3.9–10.3)

## 2012-12-25 MED ORDER — SODIUM CHLORIDE 0.9 % IV SOLN
272.0000 mg | Freq: Once | INTRAVENOUS | Status: AC
  Administered 2012-12-25: 270 mg via INTRAVENOUS
  Filled 2012-12-25: qty 27

## 2012-12-25 MED ORDER — SODIUM CHLORIDE 0.9 % IV SOLN
Freq: Once | INTRAVENOUS | Status: AC
  Administered 2012-12-25: 14:00:00 via INTRAVENOUS

## 2012-12-25 MED ORDER — GEMCITABINE HCL CHEMO INJECTION 1 GM/26.3ML
1000.0000 mg/m2 | Freq: Once | INTRAVENOUS | Status: AC
  Administered 2012-12-25: 1900 mg via INTRAVENOUS
  Filled 2012-12-25: qty 49.97

## 2012-12-25 MED ORDER — HEPARIN SOD (PORK) LOCK FLUSH 100 UNIT/ML IV SOLN
500.0000 [IU] | Freq: Once | INTRAVENOUS | Status: AC | PRN
  Administered 2012-12-25: 500 [IU]
  Filled 2012-12-25: qty 5

## 2012-12-25 MED ORDER — DEXAMETHASONE SODIUM PHOSPHATE 10 MG/ML IJ SOLN
INTRAMUSCULAR | Status: AC
  Filled 2012-12-25: qty 1

## 2012-12-25 MED ORDER — SODIUM CHLORIDE 0.9 % IJ SOLN
10.0000 mL | INTRAMUSCULAR | Status: DC | PRN
  Administered 2012-12-25: 10 mL
  Filled 2012-12-25: qty 10

## 2012-12-25 MED ORDER — DEXAMETHASONE SODIUM PHOSPHATE 10 MG/ML IJ SOLN
10.0000 mg | Freq: Once | INTRAMUSCULAR | Status: AC
  Administered 2012-12-25: 10 mg via INTRAVENOUS

## 2012-12-25 MED ORDER — ONDANSETRON 8 MG/50ML IVPB (CHCC)
8.0000 mg | Freq: Once | INTRAVENOUS | Status: AC
  Administered 2012-12-25: 8 mg via INTRAVENOUS

## 2012-12-25 MED ORDER — ONDANSETRON 8 MG/NS 50 ML IVPB
INTRAVENOUS | Status: AC
  Filled 2012-12-25: qty 8

## 2012-12-25 NOTE — Progress Notes (Signed)
[facility]  [phone] [phone]) [phone]  OFFICE PROGRESS NOTE    ID: Alyssa May   DOB: 27-Dec-1965  MR#: [id number]  ZHY#:[id number]   EXB:[id number] Not In System Alyssa: Alyssa Rudd, MD RAD ONC: Alyssa Peak, MD    HISTORY OF PRESENT ILLNESS: Alyssa May is a 47 y.o. Alyssa May woman who first noted a small mass in her left breast 01/2010, shortly after she stopped breast-feeding.  It did not disappear with resumption of her periods and grew over the next year, and particularly since she had her miscarrriage in 12/2010. This brought her to the Emergency Room 03/09/2011 where she was found to have a fluctuant area in the UOQ of the left breast measuring 8.4 cm. The tissue beneath the cystic area extending towards the axilla and medially was described as firm, but not erythematous. The cystic area was lanced and a large amount of serosanguinos material was expressed. The patient was referred to Dr. Marcille May who saw her 03/11/2011. By this time the fluid had largely reaccumulated. He incised the lesion, again draining serosanguinous liquid, packed it, and set the patient up for [facility] breast [facility] 03/14/2011. This showed an irregularly marginated mass measuring 7.3 cm, with abnormal axillary adenopathy. On 01/1102013 bilateral mammography confirmed a high-density mass in the left UOQ. This was biopsied, as was a left axillary lymph node. Both biopsies ([id number]) showed invasive ductal carcinoma, high grade, triple negative, with an Mib-1 of 97%.  Her subsequent history is as detailed below.  INTERVAL HISTORY: Alyssa May returns today for followup of her metastatic breast cancer.  She's currently due for day 8 cycle 5 of 6 planned, Qthree-week doses of Carboplatin/Gemcitabine, with both agents given on days 1 and 8.  Neulasta is given on day 9 for granulocyte support.  Since her last office visit on 12/17/2012, Alyssa May has been doing  relatively well. She has been tolerating chemotherapy well.  Her interval history is otherwise unremarkable.   REVIEW OF SYSTEMS: A 10 point review of systems was completed and is negative.  Alyssa May denies any fevers, chills, or night sweats. She denies any abnormal bleeding or bruising. She's had no mouth ulcers or oral sensitivity.  She has a good appetite. She denies any nausea and emesis.   She's had no new cough, increased shortness of breath, or chest pain. She denies any abnormal headaches, dizziness, or change in vision. She denies any myalgias/arthralgias.  She's had no peripheral swelling or increased peripheral neuropathy.  A detailed review of systems is otherwise stable and noncontributory.   PAST MEDICAL HISTORY: Past Medical History  Diagnosis Date  . Breast abscess 03/2011  . History of bronchitis     last time OCt-Nov 2012  . Seizures     as a toddler ,but none since then.  . Joint pain   . History of blood transfusion     in May 2013-no abnormal reaction noted  . Cancer     Left Breast    PAST SURGICAL HISTORY: Past Surgical History  Procedure Laterality Date  . Dilation and curettage of uterus  01/04/11  . Breast mass excision  2013  . Tonsillectomy    . Portacath placement  04/12/2011    Procedure: INSERTION PORT-A-CATH;  Surgeon: Alyssa Arms. Tsuei, MD;  Location: [facility];  Service: General;  Laterality: Right;  right subclavian  . Incision and drainage breast abscess  03/31/11    FAMILY HISTORY Family History  Problem Relation  Age of Onset  . Diabetes Mother   . Hypertension Mother   [name] died from post-operative complications at age 32; the patient's mother is 84, lives in [place] The patient is an only child.   GYNECOLOGIC HISTORY: First pregnancy to term age 47. Her menstrual cycles were interrupted with chemotherapy, but resumed in 05/2012.  She has not had a menses since her menstrual cycle in 05/2012.   SOCIAL HISTORY: She  is a Proofreader, living in [place] but originally from the [place] area. She has a good deal of family in this area. Her husband Alyssa May is a Hospital doctor for a sedan service in [place] he works there 15 days, and then returns home for another 15 days. Currently he is considering a similar job in [place], [name]. Their daughter, Alyssa May, is being home schooled.      ADVANCED DIRECTIVES: Not in place   HEALTH MAINTENANCE: History  Substance Use Topics  . Smoking status: Never Smoker   . Smokeless tobacco: Never Used  . Alcohol Use: Yes     Comment: occasionally wine     Colonoscopy:  N/A  PAP: 03/18/2011  Bone density: N/A  Lipid panel: Not on file   Allergies  Allergen Reactions  . Sulfa Antibiotics Swelling    Current Outpatient Prescriptions  Medication Sig Dispense Refill  . B Complex CAPS Take 1 capsule by mouth daily.      . Cholecalciferol (VITAMIN D3) 2000 UNITS TABS Take 1 tablet by mouth daily.      [name] GLUCOSAMINE CHONDROITIN COMPLX PO Take 1 each by mouth daily. Liquid glucosamine chondroitin      . lidocaine-prilocaine (EMLA) cream Apply 1 application topically as needed. To port  30 g  1  . LORazepam (ATIVAN) 0.5 MG tablet Take 1 tablet (0.5 mg total) by mouth 2 (two) times daily as needed for anxiety.  20 tablet  0  . metoCLOPramide (REGLAN) 10 MG tablet Take 1 tablet (10 mg total) by mouth 4 (four) times daily -  before meals and at bedtime.  120 tablet  6  . omeprazole (PRILOSEC) 20 MG capsule Take 1 capsule (20 mg total) by mouth 2 (two) times daily.  60 capsule  2  . ondansetron (ZOFRAN) 8 MG tablet Take 1 tablet (8 mg total) by mouth every 8 (eight) hours as needed for nausea.  30 tablet  1  . oxyCODONE-acetaminophen (PERCOCET/ROXICET) 5-325 MG per tablet Take 1 tablet by mouth every 8 (eight) hours as needed for pain.  90 tablet  0  . amoxicillin (AMOXIL) 500 MG capsule Take 4 capsules (2,000 mg total) by mouth once.  4 capsule  1  . promethazine  (PHENERGAN) 25 MG suppository Place 1 suppository (25 mg total) rectally every 6 (six) hours as needed for nausea.  6 each  6   No current facility-administered medications for this visit.    OBJECTIVE:  Middle-aged [place] American woman in no acute distress  Filed Vitals:   12/25/12 1306  BP: 109/71  Pulse: 80  Temp: 97.9 F (36.6 C)  Resp: 18     Body mass index is 28.72 kg/(m^2).    ECOG FS: 0 - Asymptomatic   Filed Weights   12/25/12 1306  Weight: 172 lb 9.6 oz (78.291 kg)    General appearance: Alert, cooperative, well nourished, no apparent distress Head: Normocephalic, chemotherapy-induced alopecia, atraumatic,  Eyes: Conjunctivae/corneas clear, PERRLA, EOMI Nose: Nares, septum and mucosa are normal, no drainage or sinus  tenderness Neck: No adenopathy, supple, symmetrical, trachea midline, no tenderness Resp: Clear to auscultation bilaterally, no wheezes/rales/rhonchi Cardio: Regular rate and rhythm, S1, S2 normal, no murmur, click, rub or gallop, no edema, right chest Port-A-Cath covered in EMLA cream and with occlusive dressing Breasts: Left breast and axilla area has fixed firm tumor mass GI: Soft, distended, non-tender, hypoactive bowel sounds, no organomegaly Skin: No rashes/lesions, skin warm and dry, no erythematous areas, no cyanosis  M/S:  Atraumatic, normal strength in all extremities, normal range of motion, no clubbing  Lymph nodes: Cervical and supraclavicular nodes are normal Neurologic: Grossly normal, cranial nerves II through XII intact, alert and oriented x 3 Psych: Appropriate affect   LAB RESULTS: Lab Results  Component Value Date   WBC 2.5* 12/25/2012   NEUTROABS 1.0* 12/25/2012   HGB 9.8* 12/25/2012   HCT 30.2* 12/25/2012   MCV 88.0 12/25/2012   PLT 153 12/25/2012       Chemistry      Component Value Date/Time   NA 138 12/25/2012 1252   NA 138 03/16/2012 1208   K 3.8 12/25/2012 1252   K 4.1 03/16/2012 1208   CL 103 07/05/2012 1448    CL 97 03/16/2012 1208   CO2 26 12/25/2012 1252   CO2 25 03/16/2012 1208   BUN 12.3 12/25/2012 1252   BUN 18 03/16/2012 1208   CREATININE 0.8 12/25/2012 1252   CREATININE 0.88 03/16/2012 1208      Component Value Date/Time   CALCIUM 8.8 12/25/2012 1252   CALCIUM 9.9 03/16/2012 1208   ALKPHOS 70 12/25/2012 1252   ALKPHOS 44 09/22/2011 1006   AST 23 12/25/2012 1252   AST 17 09/22/2011 1006   ALT 16 12/25/2012 1252   ALT 12 09/22/2011 1006   BILITOT 0.38 12/25/2012 1252   BILITOT 0.2* 09/22/2011 1006       STUDIES: No results found.   ASSESSMENT: Alyssa May is a 47 y.o.  [place], [name] woman with stage IV breast cancer (liver involvement at presentation but no bone, lung or brain spread):  (1)  Status post upper outer quadrant left breast biopsy 03/18/2011 showing a clinical T4,N1 invasive ductal carcinoma, high grade, triple negative, with an Mib-1 of 97%, with a single liver metastasis pathologically confirmed 03/31/2011.  (2) Status post 4 cycles of Qthree-week docetaxel/ doxorubicin/cyclophosphamide in the neoadjuvant setting, discontinued after 4 cycles due to peripheral neuropathy.  Status post 2 cycles of every 3 week carboplatin/gemcitabine, for a total of 6 neoadjuvant cycles altogether, completed 11/09/2011.  (3) The patient refused surgery.  (4) With subsequent tumor re-growth, started Carboplatin/Gemcitabine on 09/10/2012, with treatment on days 1 and 8 of each 21 day cycle, 4-6 cycles planned before definitive surgery.  (5) Tooth extractions without complication on 12/13/2012 for dental issues.    PLAN:  Alyssa May will proceed with chemotherapy day 8 of cycle 5 consisting of Carboplatinum/Gemcitabine. Neulasta injection for granulocyte support is scheduled for 12/26/2012.   Once again, the plan is to proceed with 6 cycles of chemotherapy prior to definitive surgery. We'll continue to see her for labs and physical exam on each treatment day. She'll continue  to receive Neulasta injections on day 9 of each cycle. She is also scheduled to receive 500 mL of normal saline via IV during chemotherapy cycles of Gemcitabine/Carboplatin infusions for dehydration.   She will have restaging scans after the completion of cycle 6 to include CTs of the chest/abdomen/pelvis on approximately 01/18/2013. She'll see Dr. Darnelle Catalan soon thereafter to assess  her response and decide whether or not to proceed with surgery or to proceed with 2 additional cycles of chemotherapy for a total of 8.   We plan to see Alyssa May again on 01/09/2013 for scheduled chemotherapy cycle #6/day 1.  Laboratories of CBC and CMP will be checked at that time.  Neulasta injection is scheduled for 01/10/2013.  All questions answered.  Alyssa May was encouraged to contact us in the interim with any problems, questions, or concerns.   Larina Bras, NP-C  12/26/2012 3:07 PM

## 2012-12-25 NOTE — Progress Notes (Signed)
Ok to treat per Annice Pih per Dr. Darnelle Catalan.

## 2012-12-25 NOTE — Patient Instructions (Signed)
Please contact us at [phone] if you have any questions or concerns.  Please continue to do well and enjoy life!!!  Get plenty of rest, drink plenty of water, exercise daily, eat a balanced diet.   Results for orders placed in visit on 12/25/12 (from the past 24 hour(s))  CBC WITH DIFFERENTIAL     Status: Abnormal   Collection Time    12/25/12 12:52 PM      Result Value Range   WBC 2.5 (*) 3.9 - 10.3 10e3/uL   NEUT# 1.0 (*) 1.5 - 6.5 10e3/uL   HGB 9.8 (*) 11.6 - 15.9 g/dL   HCT 30.8 (*) 65.7 - 84.6 %   Platelets 153  145 - 400 10e3/uL   MCV 88.0  79.5 - 101.0 fL   MCH 28.6  25.1 - 34.0 pg   MCHC 32.5  31.5 - 36.0 g/dL   RBC 9.62 (*) 9.52 - 8.41 10e6/uL   RDW 19.7 (*) 11.2 - 14.5 %   lymph# 1.4  0.9 - 3.3 10e3/uL   MONO# 0.1  0.1 - 0.9 10e3/uL   Eosinophils Absolute 0.0  0.0 - 0.5 10e3/uL   Basophils Absolute 0.0  0.0 - 0.1 10e3/uL   NEUT% 41.2  38.4 - 76.8 %   LYMPH% 54.4 (*) 14.0 - 49.7 %   MONO% 2.8  0.0 - 14.0 %   EOS% 0.8  0.0 - 7.0 %   BASO% 0.8  0.0 - 2.0 %   Narrative:    Performed At:  [facility]               [address]               [place], [place] [address]  COMPREHENSIVE METABOLIC PANEL (CC13)     Status: None   Collection Time    12/25/12 12:52 PM      Result Value Range   Sodium 138  136 - 145 mEq/L   Potassium 3.8  3.5 - 5.1 mEq/L   Chloride 103  98 - 109 mEq/L   CO2 26  22 - 29 mEq/L   Glucose 93  70 - 140 mg/dl   BUN 10.2  7.0 - 72.5 mg/dL   Creatinine 0.8  0.6 - 1.1 mg/dL   Total Bilirubin 3.66  0.20 - 1.20 mg/dL   Alkaline Phosphatase 70  40 - 150 U/L   AST 23  5 - 34 U/L   ALT 16  0 - 55 U/L   Total Protein 7.5  6.4 - 8.3 g/dL   Albumin 3.6  3.5 - 5.0 g/dL   Calcium 8.8  8.4 - 44.0 mg/dL   Anion Gap 9  3 - 11 mEq/L   Narrative:    Performed At:  [facility]               [address]               [place], [place] [address]

## 2012-12-25 NOTE — Patient Instructions (Signed)
[facility] Discharge Instructions for Patients Receiving Chemotherapy  Today you received the following chemotherapy agents Gemzar/Carboplatin.  To help prevent nausea and vomiting after your treatment, we encourage you to take your nausea medication as prescribed.   If you develop nausea and vomiting that is not controlled by your nausea medication, call the clinic.   BELOW ARE SYMPTOMS THAT SHOULD BE REPORTED IMMEDIATELY:  *FEVER GREATER THAN 100.5 F  *CHILLS WITH OR WITHOUT FEVER  NAUSEA AND VOMITING THAT IS NOT CONTROLLED WITH YOUR NAUSEA MEDICATION  *UNUSUAL SHORTNESS OF BREATH  *UNUSUAL BRUISING OR BLEEDING  TENDERNESS IN MOUTH AND THROAT WITH OR WITHOUT PRESENCE OF ULCERS  *URINARY PROBLEMS  *BOWEL PROBLEMS  UNUSUAL RASH Items with * indicate a potential emergency and should be followed up as soon as possible.  Feel free to call the clinic you have any questions or concerns. The clinic phone number is [phone].    

## 2012-12-25 NOTE — Telephone Encounter (Signed)
, °

## 2012-12-26 ENCOUNTER — Ambulatory Visit (HOSPITAL_BASED_OUTPATIENT_CLINIC_OR_DEPARTMENT_OTHER): Payer: Medicaid Other

## 2012-12-26 ENCOUNTER — Other Ambulatory Visit: Payer: Self-pay | Admitting: Physician Assistant

## 2012-12-26 VITALS — BP 119/79 | HR 93 | Temp 98.4°F | Resp 16

## 2012-12-26 DIAGNOSIS — Z5189 Encounter for other specified aftercare: Secondary | ICD-10-CM

## 2012-12-26 DIAGNOSIS — C50419 Malignant neoplasm of upper-outer quadrant of unspecified female breast: Secondary | ICD-10-CM

## 2012-12-26 DIAGNOSIS — D649 Anemia, unspecified: Secondary | ICD-10-CM

## 2012-12-26 MED ORDER — HEPARIN SOD (PORK) LOCK FLUSH 100 UNIT/ML IV SOLN
500.0000 [IU] | Freq: Once | INTRAVENOUS | Status: DC | PRN
  Filled 2012-12-26: qty 5

## 2012-12-26 MED ORDER — PEGFILGRASTIM INJECTION 6 MG/0.6ML
6.0000 mg | Freq: Once | SUBCUTANEOUS | Status: AC
  Administered 2012-12-26: 6 mg via SUBCUTANEOUS
  Filled 2012-12-26: qty 0.6

## 2012-12-26 MED ORDER — SODIUM CHLORIDE 0.9 % IJ SOLN
10.0000 mL | INTRAMUSCULAR | Status: DC | PRN
  Filled 2012-12-26: qty 10

## 2012-12-27 ENCOUNTER — Telehealth: Payer: Self-pay | Admitting: Oncology

## 2012-12-27 NOTE — Telephone Encounter (Signed)
S/w the pt and she is aware of her added neulasta injections. Pt aware to pick up the calendar when she comes in for her appt.

## 2013-01-03 ENCOUNTER — Other Ambulatory Visit: Payer: Self-pay | Admitting: Oncology

## 2013-01-03 ENCOUNTER — Telehealth: Payer: Self-pay | Admitting: *Deleted

## 2013-01-03 NOTE — Telephone Encounter (Signed)
Pt called reporting a fever of 102.9.  Note pt is under chemotherapy and has a port a cath.  Last treatment was with carbo/gemzar on 12/25/2012 with neulasta support on 12/26/2012.  Per discussion pt is in [place] due to family death yesterday.  Per discussion with [place] and pt - pt will proceed to the ER for fever work up for possible sepsis.  Doreatha states she will proceed to the [facility]. This RN contacted above at [phone] and spoke with Kingsley Spittle in ER. Informed her of pt's pending arrival - fax number obtained as [phone].  Records faxed.

## 2013-01-07 ENCOUNTER — Ambulatory Visit: Payer: Self-pay

## 2013-01-07 ENCOUNTER — Ambulatory Visit: Payer: Self-pay | Admitting: Physician Assistant

## 2013-01-07 ENCOUNTER — Other Ambulatory Visit: Payer: Self-pay | Admitting: Lab

## 2013-01-08 ENCOUNTER — Other Ambulatory Visit: Payer: Self-pay | Admitting: *Deleted

## 2013-01-08 NOTE — Progress Notes (Signed)
Pt called to this RN to state she is still in the [place] area with family due to death - she needs to cancel appt s 11/6 and reschedule to 11/13.  Noted appointments on 11/12- per discussion Alyssa May states she feels she will not be in town by then to do an all day office and treatment. Request is to reschedule for 11/13.  POF placed per above request.  Note multiple appointments to be rescheduled -  Lab- visit- chemo d1 and d8 then CT scan approximately 2 weeks later with [place] follow up.

## 2013-01-09 ENCOUNTER — Ambulatory Visit: Payer: Self-pay

## 2013-01-09 ENCOUNTER — Ambulatory Visit: Payer: Self-pay | Admitting: Family

## 2013-01-09 ENCOUNTER — Encounter: Payer: Self-pay | Admitting: Family

## 2013-01-09 ENCOUNTER — Other Ambulatory Visit: Payer: Self-pay | Admitting: Lab

## 2013-01-10 ENCOUNTER — Ambulatory Visit: Payer: Self-pay

## 2013-01-10 ENCOUNTER — Other Ambulatory Visit: Payer: Self-pay

## 2013-01-10 ENCOUNTER — Other Ambulatory Visit: Payer: Self-pay | Admitting: Family

## 2013-01-10 ENCOUNTER — Other Ambulatory Visit: Payer: Self-pay | Admitting: *Deleted

## 2013-01-10 ENCOUNTER — Telehealth: Payer: Self-pay | Admitting: *Deleted

## 2013-01-10 NOTE — Telephone Encounter (Signed)
Per staff message and POF I have scheduled appts.  JMW  

## 2013-01-11 ENCOUNTER — Telehealth: Payer: Self-pay | Admitting: Oncology

## 2013-01-11 ENCOUNTER — Ambulatory Visit (INDEPENDENT_AMBULATORY_CARE_PROVIDER_SITE_OTHER): Payer: Medicaid Other | Admitting: Surgery

## 2013-01-11 NOTE — Telephone Encounter (Signed)
, °

## 2013-01-11 NOTE — Telephone Encounter (Signed)
LMONVM ADVIISNG  THE PT OF HER NOV 13TH APPTS AND FOR HER TO PICK UP THE APPT SCHEDULES FOR NOV AND DEC WHEN SHE COMES INTO THE [facility].

## 2013-01-15 ENCOUNTER — Other Ambulatory Visit: Payer: Self-pay | Admitting: *Deleted

## 2013-01-16 ENCOUNTER — Telehealth: Payer: Self-pay | Admitting: *Deleted

## 2013-01-16 ENCOUNTER — Ambulatory Visit: Payer: Self-pay | Admitting: Family

## 2013-01-16 ENCOUNTER — Other Ambulatory Visit: Payer: Self-pay | Admitting: Lab

## 2013-01-16 ENCOUNTER — Ambulatory Visit: Payer: Self-pay

## 2013-01-16 NOTE — Telephone Encounter (Signed)
Patient unable to get back to town today for appointments tomorrow. Death in family and still in [place] attending funeral services. Would like to move chemo appts to start on Monday 11/17 and 11/24. Will have scheduling get her new appts scheduled and call back to confirm once lab/office visit with Annice Pih and chemo have been rescheduled. BEST NUMBER TO REACH PATIENT FOR THESE APPTS==[phone].

## 2013-01-16 NOTE — Telephone Encounter (Signed)
sw pt gv appts for 01/21/13 w/ labs@ 1:30p, ov@ 2pm, and tx to follow. i emailed MW to add the tx's. Pt is aware that i emailed [facility] and [facility] for time slots on 01/28/13.

## 2013-01-17 ENCOUNTER — Telehealth: Payer: Self-pay | Admitting: *Deleted

## 2013-01-17 ENCOUNTER — Ambulatory Visit: Payer: Self-pay

## 2013-01-17 ENCOUNTER — Other Ambulatory Visit: Payer: Self-pay | Admitting: Lab

## 2013-01-17 ENCOUNTER — Ambulatory Visit: Payer: Self-pay | Admitting: Family

## 2013-01-17 NOTE — Telephone Encounter (Signed)
Per staff message and POF I have scheduled appts.  JMW  

## 2013-01-17 NOTE — Telephone Encounter (Signed)
I have adjusted 11/24 appt

## 2013-01-17 NOTE — Telephone Encounter (Signed)
sw pt gv appt for 01/28/13 with labs@ 1:30pm, ov@ 2pm, and tx to follow. i emailed MW to see if tx need to be adjusted for 11.24.14...td

## 2013-01-18 ENCOUNTER — Ambulatory Visit: Payer: Self-pay

## 2013-01-18 ENCOUNTER — Ambulatory Visit (HOSPITAL_COMMUNITY): Payer: Medicaid Other

## 2013-01-18 ENCOUNTER — Other Ambulatory Visit (HOSPITAL_COMMUNITY): Payer: Medicaid Other

## 2013-01-21 ENCOUNTER — Ambulatory Visit: Payer: Self-pay | Admitting: Family

## 2013-01-21 ENCOUNTER — Other Ambulatory Visit: Payer: Self-pay

## 2013-01-21 ENCOUNTER — Ambulatory Visit: Payer: Self-pay

## 2013-01-22 ENCOUNTER — Other Ambulatory Visit: Payer: Self-pay | Admitting: Lab

## 2013-01-22 ENCOUNTER — Ambulatory Visit: Payer: Self-pay

## 2013-01-22 ENCOUNTER — Encounter: Payer: Self-pay | Admitting: Family

## 2013-01-22 ENCOUNTER — Ambulatory Visit: Payer: Self-pay | Admitting: Oncology

## 2013-01-22 ENCOUNTER — Ambulatory Visit (INDEPENDENT_AMBULATORY_CARE_PROVIDER_SITE_OTHER): Payer: Medicaid Other | Admitting: Surgery

## 2013-01-23 ENCOUNTER — Telehealth: Payer: Self-pay | Admitting: Family

## 2013-01-23 ENCOUNTER — Other Ambulatory Visit: Payer: Self-pay | Admitting: Lab

## 2013-01-23 ENCOUNTER — Ambulatory Visit: Payer: Self-pay | Admitting: Physician Assistant

## 2013-01-23 NOTE — Telephone Encounter (Signed)
Sent letter to patient from J.Hunter PA-C

## 2013-01-24 ENCOUNTER — Other Ambulatory Visit: Payer: Self-pay | Admitting: Lab

## 2013-01-24 ENCOUNTER — Ambulatory Visit: Payer: Self-pay | Admitting: Physician Assistant

## 2013-01-24 ENCOUNTER — Encounter: Payer: Self-pay | Admitting: Physician Assistant

## 2013-01-24 ENCOUNTER — Ambulatory Visit: Payer: Self-pay

## 2013-01-24 NOTE — Progress Notes (Signed)
[facility] today.  Letter mailed to patient.   Lacora Folmer, PA-C 01/24/2013 

## 2013-01-25 ENCOUNTER — Telehealth: Payer: Self-pay | Admitting: Physician Assistant

## 2013-01-25 ENCOUNTER — Encounter (INDEPENDENT_AMBULATORY_CARE_PROVIDER_SITE_OTHER): Payer: Self-pay | Admitting: Surgery

## 2013-01-25 ENCOUNTER — Ambulatory Visit (INDEPENDENT_AMBULATORY_CARE_PROVIDER_SITE_OTHER): Payer: Medicaid Other | Admitting: Surgery

## 2013-01-25 VITALS — BP 110/64 | HR 76 | Resp 16 | Ht 65.0 in | Wt 174.4 lb

## 2013-01-25 DIAGNOSIS — C50412 Malignant neoplasm of upper-outer quadrant of left female breast: Secondary | ICD-10-CM

## 2013-01-25 DIAGNOSIS — C50419 Malignant neoplasm of upper-outer quadrant of unspecified female breast: Secondary | ICD-10-CM

## 2013-01-25 NOTE — Progress Notes (Signed)
Patient ID: Alyssa May, female   DOB: Jul 24, 1965, 47 y.o.   MRN: [id number]  Chief Complaint  Patient presents with  . Breast Cancer Long Term Follow Up    HPI Alyssa May is a 47 y.o. female.  Oncology - [facility] HPI Alyssa May is a 47 year-old [place] woman who first noted a small mass in her left breast 01/2010, shortly after she stopped breast-feeding. It did not disappear with resumption of her periods and grew over the next year, and particularly since she had her miscarrriage in 12/2010. This brought her to the Emergency Room 03/09/2011 where she was found to have a fluctuant area in the UOQ of the left breast measuring 8.4 cm. The tissue beneath the cystic area extending towards the axilla and medially was described as firm, but not erythematous. The cystic area was lanced and a large amount of serosanguinous material was expressed.   The patient was referred to Dr. Marcille Blanco who saw her 03/11/2011. By this time the fluid had largely reaccumulated. He incised the lesion, again draining serosanguinous liquid, packed it, and set the patient up [facility]14[facility]. This showed an irregularly marginated mass measuring 7.3 cm, with abnormal axillary adenopathy. On 03/18/2011 bilateral mammography confirmed a high-density mass in the left UOQ. This was biopsied, as was a left axillary lymph node. Both biopsies ([id number]) showed invasive ductal carcinoma, high grade, triple negative, with an Mib-1 of 97%.  A port was placed in the right subclavian vein on 04/12/11.  PET scan showed a hepatic metastases. Over the last 18 months the patient has had an erratic treatment course. She delayed the onset of her neoadjuvant chemotherapy for some time to attempt a naturopathic remedy.  She has frequently not shown up for her appointments or has shown up late on multiple occasions.  She finally began chemotherapy in May of 2013 which resulted in a  noticeable response. The massive tumor became necrotic and we debrided this locally.The wound finally healed and the patient was scheduled for a left modified radical mastectomy in October of 2013.  The patient decided not to have surgery then but returned in December of 2013 for further discussion.  Began, she failed to schedule her surgery. She has not had any visits with [place] since that time.  She has continued her chemotherapy but it appears that the tumor is beginning to enlarge. Over the last couple of days she has begun to have some skin breakdown in the lateral part of her left breast.  Past Medical History  Diagnosis Date  . Breast abscess 03/2011  . History of bronchitis     last time OCt-Nov 2012  . Seizures     as a toddler ,but none since then.  . Joint pain   . History of blood transfusion     in May 2013-no abnormal reaction noted  . Cancer     Left Breast    Past Surgical History  Procedure Laterality Date  . Dilation and curettage of uterus  01/04/11  . Breast mass excision  2013  . Tonsillectomy    . Portacath placement  04/12/2011    Procedure: INSERTION PORT-A-CATH;  Surgeon: Wilmon Arms. Brithany Whitworth, MD;  Location: [facility];  Service: General;  Laterality: Right;  right subclavian  . Incision and drainage breast abscess  03/31/11    Family History  Problem Relation Age of Onset  . Diabetes Mother   . Hypertension  Mother     Social History History  Substance Use Topics  . Smoking status: Never Smoker   . Smokeless tobacco: Never Used  . Alcohol Use: Yes     Comment: occasionally wine    Allergies  Allergen Reactions  . Sulfa Antibiotics Swelling    Current Outpatient Prescriptions  Medication Sig Dispense Refill  . amoxicillin (AMOXIL) 500 MG capsule Take 4 capsules (2,000 mg total) by mouth once.  4 capsule  1  . B Complex CAPS Take 1 capsule by mouth daily.      . Cholecalciferol (VITAMIN D3) 2000 UNITS TABS Take 1 tablet by mouth daily.      [name] GLUCOSAMINE  CHONDROITIN COMPLX PO Take 1 each by mouth daily. Liquid glucosamine chondroitin      . lidocaine-prilocaine (EMLA) cream Apply 1 application topically as needed. To port  30 g  1  . LORazepam (ATIVAN) 0.5 MG tablet Take 1 tablet (0.5 mg total) by mouth 2 (two) times daily as needed for anxiety.  20 tablet  0  . metoCLOPramide (REGLAN) 10 MG tablet Take 1 tablet (10 mg total) by mouth 4 (four) times daily -  before meals and at bedtime.  120 tablet  6  . omeprazole (PRILOSEC) 20 MG capsule Take 1 capsule (20 mg total) by mouth 2 (two) times daily.  60 capsule  2  . ondansetron (ZOFRAN) 8 MG tablet Take 1 tablet (8 mg total) by mouth every 8 (eight) hours as needed for nausea.  30 tablet  1  . promethazine (PHENERGAN) 25 MG suppository Place 1 suppository (25 mg total) rectally every 6 (six) hours as needed for nausea.  6 each  6  . oxyCODONE-acetaminophen (PERCOCET/ROXICET) 5-325 MG per tablet Take 1 tablet by mouth every 8 (eight) hours as needed for pain.  90 tablet  0   No current facility-administered medications for this visit.    Review of Systems Review of Systems  Constitutional: Negative for fever, chills and unexpected weight change.  HENT: Negative for congestion, hearing loss, sore throat, trouble swallowing and voice change.   Eyes: Negative for visual disturbance.  Respiratory: Negative for cough and wheezing.   Cardiovascular: Negative for chest pain, palpitations and leg swelling.  Gastrointestinal: Negative for nausea, vomiting, abdominal pain, diarrhea, constipation, blood in stool, abdominal distention and anal bleeding.  Genitourinary: Negative for hematuria, vaginal bleeding and difficulty urinating.  Musculoskeletal: Negative for arthralgias.  Skin: Negative for rash and wound.  Neurological: Negative for seizures, syncope and headaches.  Hematological: Negative for adenopathy. Does not bruise/bleed easily.  Psychiatric/Behavioral: Negative for confusion.    Blood  pressure 110/64, pulse 76, resp. rate 16, height 5\' 5"  (1.651 m), weight 174 lb 6.4 oz (79.107 kg).  Physical Exam Physical Exam WDWN in NAD HEENT:  EOMI, sclera anicteric Neck:  No masses, no thyromegaly Lungs:  CTA bilaterally; normal respiratory effort Breasts:  No palpable right breast mass; lateral left breast mass shows a 7 cm hard nodular mass with some overlying skin breakdown laterally; palpable 2 cm lymph node in the axilla CV:  Regular rate and rhythm; no murmurs Abd:  +bowel sounds, soft, non-tender, no masses Ext:  Well-perfused; no edema Skin:  Warm, dry; no sign of jaundice  Data Reviewed CLINICAL DATA: Left breast cancer with liver metastases  EXAM:  CT CHEST, ABDOMEN, AND PELVIS WITH CONTRAST  TECHNIQUE:  Multidetector CT imaging of the chest, abdomen and pelvis was  performed following the standard protocol during bolus  administration  of intravenous contrast.  CONTRAST: 50mL OMNIPAQUE IOHEXOL 300 MG/ML SOLN, OMNIPAQUE  IOHEXOL 300 MG/ML SOLN  COMPARISON: CT chest dated 09/10/2012. MRI abdomen dated  04/24/2012.  FINDINGS:  CT CHEST FINDINGS  Lungs are clear. No suspicious pulmonary nodules. No pleural  effusion or pneumothorax.  4.8 x 3.2 cm mass in the upper outer left breast, corresponding to  known breast cancer, previously 12.1 x 6.3 cm. Associated skin  thickening involving the overlying inferior left breast (series  2/image 31).  12 mm short axis left axillary nodal metastasis (series 2/ image  17), previously 3.2 cm. Additional 6 mm short axis left axillary  node (series 2/ image 14) and a 9 mm short axis left supraclavicular  node (series 2/ image 5), unchanged, nonspecific.  The heart is normal in size. No pericardial effusion.  Right chest port.  No suspicious mediastinal or hilar lymphadenopathy.  Very mild degenerative changes of the thoracic spine. No focal  osseous lesions.  CT ABDOMEN AND PELVIS FINDINGS  7 mm subcapsular metastasis  in the lateral segment left hepatic lobe  (series 2/image 53), decreased. Mild focal fat/altered perfusion  along the falciform ligament (series 2/image 56).  Spleen, pancreas, and adrenal glands are within normal limits.  Gallbladder is unremarkable. No intrahepatic or extrahepatic ductal  dilatation.  Kidneys are within normal limits. No hydronephrosis.  No evidence of bowel obstruction. Moderate colonic stool burden.  No evidence of abdominal aortic aneurysm.  No abdominopelvic ascites.  No suspicious abdominopelvic lymphadenopathy.  Uterus is mildly heterogeneous. No adnexal masses.  Bladder is within normal limits.  Sclerosis involving the bilateral parasymphyseal regions (series  2/image 105), likely reflecting osteitis pubis, benign. Otherwise,  visualized osseous structures are within normal limits.  IMPRESSION:  CT CHEST IMPRESSION  4.8 cm mass in the upper outer left breast, corresponding to known  of breast cancer, decreased.  12 mm short axis left axillary nodal metastasis, decreased.  CT ABDOMEN AND PELVIS IMPRESSION  7 mm subcapsular metastasis in the lateral segment left hepatic  lobe, decreased.  Electronically Signed  By: Charline Bills M.D.  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  The surgical procedure has been discussed with the patient.  Potential risks, benefits, alternative treatments, and expected outcomes have been explained.  All of the patient's questions at this time have been answered.  The patient states that she will call back to schedule her surgery in January after she has completed her chemotherapy and had her repeat scans. Based on her history  Over the last 2 years, I am skeptical that she will call back to schedule surgery. My hope is that one day, she will begin  following our treatment recommendations and will stop delaying her own care.        Catherine Oak K. 01/25/2013, 12:47 PM

## 2013-01-25 NOTE — Telephone Encounter (Signed)
Sent letter to patient from Amy Berry °

## 2013-01-28 ENCOUNTER — Ambulatory Visit (HOSPITAL_BASED_OUTPATIENT_CLINIC_OR_DEPARTMENT_OTHER): Payer: Medicaid Other | Admitting: Family

## 2013-01-28 ENCOUNTER — Other Ambulatory Visit (HOSPITAL_BASED_OUTPATIENT_CLINIC_OR_DEPARTMENT_OTHER): Payer: Medicaid Other | Admitting: Lab

## 2013-01-28 ENCOUNTER — Encounter: Payer: Self-pay | Admitting: Family

## 2013-01-28 ENCOUNTER — Ambulatory Visit (HOSPITAL_BASED_OUTPATIENT_CLINIC_OR_DEPARTMENT_OTHER): Payer: Medicaid Other

## 2013-01-28 VITALS — BP 108/74 | HR 92 | Temp 98.4°F | Resp 18 | Ht 65.0 in | Wt 173.6 lb

## 2013-01-28 DIAGNOSIS — C50412 Malignant neoplasm of upper-outer quadrant of left female breast: Secondary | ICD-10-CM

## 2013-01-28 DIAGNOSIS — C50419 Malignant neoplasm of upper-outer quadrant of unspecified female breast: Secondary | ICD-10-CM

## 2013-01-28 DIAGNOSIS — N9489 Other specified conditions associated with female genital organs and menstrual cycle: Secondary | ICD-10-CM

## 2013-01-28 DIAGNOSIS — N949 Unspecified condition associated with female genital organs and menstrual cycle: Secondary | ICD-10-CM

## 2013-01-28 DIAGNOSIS — C787 Secondary malignant neoplasm of liver and intrahepatic bile duct: Secondary | ICD-10-CM

## 2013-01-28 DIAGNOSIS — N390 Urinary tract infection, site not specified: Secondary | ICD-10-CM

## 2013-01-28 DIAGNOSIS — Z5111 Encounter for antineoplastic chemotherapy: Secondary | ICD-10-CM

## 2013-01-28 LAB — COMPREHENSIVE METABOLIC PANEL (CC13)
AST: 25 U/L (ref 5–34)
Albumin: 3.9 g/dL (ref 3.5–5.0)
Alkaline Phosphatase: 63 U/L (ref 40–150)
BUN: 13.3 mg/dL (ref 7.0–26.0)
Glucose: 111 mg/dl (ref 70–140)
Potassium: 3.9 mEq/L (ref 3.5–5.1)
Sodium: 139 mEq/L (ref 136–145)
Total Bilirubin: 0.33 mg/dL (ref 0.20–1.20)
Total Protein: 8 g/dL (ref 6.4–8.3)

## 2013-01-28 LAB — CBC WITH DIFFERENTIAL/PLATELET
BASO%: 0.5 % (ref 0.0–2.0)
Basophils Absolute: 0 10*3/uL (ref 0.0–0.1)
EOS%: 1.1 % (ref 0.0–7.0)
LYMPH%: 19.8 % (ref 14.0–49.7)
MCH: 29.9 pg (ref 25.1–34.0)
MCHC: 32.4 g/dL (ref 31.5–36.0)
MCV: 92.3 fL (ref 79.5–101.0)
MONO%: 10.2 % (ref 0.0–14.0)
Platelets: 372 10*3/uL (ref 145–400)
RBC: 3.77 10*6/uL (ref 3.70–5.45)
RDW: 20.4 % — ABNORMAL HIGH (ref 11.2–14.5)

## 2013-01-28 LAB — URINALYSIS, MICROSCOPIC - CHCC
Bilirubin (Urine): NEGATIVE
Blood: NEGATIVE
Glucose: NEGATIVE mg/dL
Nitrite: NEGATIVE
Specific Gravity, Urine: 1.015 (ref 1.003–1.035)
pH: 6 (ref 4.6–8.0)

## 2013-01-28 MED ORDER — SODIUM CHLORIDE 0.9 % IV SOLN
Freq: Once | INTRAVENOUS | Status: AC
  Administered 2013-01-28: 16:00:00 via INTRAVENOUS

## 2013-01-28 MED ORDER — DEXAMETHASONE SODIUM PHOSPHATE 10 MG/ML IJ SOLN
10.0000 mg | Freq: Once | INTRAMUSCULAR | Status: AC
  Administered 2013-01-28: 10 mg via INTRAVENOUS

## 2013-01-28 MED ORDER — DEXAMETHASONE SODIUM PHOSPHATE 10 MG/ML IJ SOLN
INTRAMUSCULAR | Status: AC
  Filled 2013-01-28: qty 1

## 2013-01-28 MED ORDER — SODIUM CHLORIDE 0.9 % IV SOLN
272.0000 mg | Freq: Once | INTRAVENOUS | Status: AC
  Administered 2013-01-28: 270 mg via INTRAVENOUS
  Filled 2013-01-28: qty 27

## 2013-01-28 MED ORDER — HEPARIN SOD (PORK) LOCK FLUSH 100 UNIT/ML IV SOLN
500.0000 [IU] | Freq: Once | INTRAVENOUS | Status: AC | PRN
  Administered 2013-01-28: 500 [IU]
  Filled 2013-01-28: qty 5

## 2013-01-28 MED ORDER — FLUCONAZOLE 100 MG PO TABS
100.0000 mg | ORAL_TABLET | Freq: Every day | ORAL | Status: DC
Start: 2013-01-28 — End: 2013-02-23

## 2013-01-28 MED ORDER — ONDANSETRON 8 MG/50ML IVPB (CHCC)
8.0000 mg | Freq: Once | INTRAVENOUS | Status: AC
  Administered 2013-01-28: 8 mg via INTRAVENOUS

## 2013-01-28 MED ORDER — ONDANSETRON 8 MG/NS 50 ML IVPB
INTRAVENOUS | Status: AC
  Filled 2013-01-28: qty 8

## 2013-01-28 MED ORDER — SODIUM CHLORIDE 0.9 % IV SOLN
1000.0000 mg/m2 | Freq: Once | INTRAVENOUS | Status: AC
  Administered 2013-01-28: 1900 mg via INTRAVENOUS
  Filled 2013-01-28: qty 49.97

## 2013-01-28 MED ORDER — CIPROFLOXACIN HCL 500 MG PO TABS: 500.0000 mg | ORAL_TABLET | Freq: Two times a day (BID) | ORAL | Status: DC

## 2013-01-28 MED ORDER — SODIUM CHLORIDE 0.9 % IJ SOLN
10.0000 mL | INTRAMUSCULAR | Status: DC | PRN
  Administered 2013-01-28: 10 mL
  Filled 2013-01-28: qty 10

## 2013-01-28 NOTE — Progress Notes (Signed)
Alyssa May  Telephone:([phone] Fax:([phone]  OFFICE PROGRESS NOTE     ID: Alyssa May   DOB: 09/20/1965  MR#: [id number]  JYN#:[id number]   QMV:HQIONGEX Not In System SU: Alyssa Rudd, MD RAD ONC: Alyssa Peak, MD    HISTORY OF PRESENT ILLNESS: Alyssa May is a 47 y.o. [place] woman who first noted a small mass in her left breast 01/2010, shortly after she stopped breast-feeding.  It did not disappear with resumption of her periods and grew over the next year, and particularly since she had her miscarrriage in 12/2010. This brought her to the Emergency Room 03/09/2011 where she was found to have a fluctuant area in the UOQ of the left breast measuring 8.4 cm. The tissue beneath the cystic area extending towards the axilla and medially was described as firm, but not erythematous. The cystic area was lanced and a large amount of serosanguinos material was expressed. The patient was referred to Dr. Marcille May who saw her 03/11/2011. By this time the fluid had largely reaccumulated. He incised the lesion, again draining serosanguinous liquid, packed it, and set the patient up for [facility]. This showed an irregularly marginated mass measuring 7.3 cm, with abnormal axillary adenopathy. On 01/1102013 bilateral mammography confirmed a high-density mass in the left UOQ. This was biopsied, as was a left axillary lymph node. Both biopsies ([id number]) showed invasive ductal carcinoma, high grade, triple negative, with an Mib-1 of 97%.  Her subsequent history is as detailed below.  INTERVAL HISTORY: Alyssa May returns today for followup of her metastatic breast cancer.  A few personal issues and a death in her family have delayed chemotherapy treatments, and she notes her left breast ulcerated breast cancer has had some mild bleeding.  She is currently due for day 1 cycle #6 of 6, Qthree-week doses of Carboplatin/Gemcitabine, with both  agents given on days 1 and 8.  Neulasta is given on day 9 for granulocyte support.  She has complains of vaginal itching and burning x 2 weeks.  Since her last office visit on 12/25/2012, Alyssa May has otherwise been doing relatively well. She had been tolerating chemotherapy well.  She met with Dr. Corliss May on 01/25/2013 who recommended a left modified radical mastectomy and would like to schedule surgery in 03/2011 after she's completed chemotherapy and restaging scans. Her interval history is otherwise stable.   REVIEW OF SYSTEMS: A 10 point review of systems was completed and is negative except as stated above.  Alyssa May denies any fevers, chills, or night sweats. She has had some bleeding at the site of her left breast carcinoma but denies any unusual bruising. She's had no mouth ulcers or oral sensitivity.  She has a good appetite. She denies any nausea and emesis.   She's had no new cough, increased shortness of breath, or chest pain. She denies any abnormal headaches, dizziness, or change in vision. She denies any myalgias/arthralgias.  She's had no peripheral swelling or increased peripheral neuropathy.  A detailed review of systems is otherwise stable and noncontributory.   PAST MEDICAL HISTORY: Past Medical History  Diagnosis Date  . Breast abscess 03/2011  . History of bronchitis     last time OCt-Nov 2012  . Seizures     as a toddler ,but none since then.  . Joint pain   . History of blood transfusion     in May 2013-no abnormal reaction noted  . Cancer  Left Breast    PAST SURGICAL HISTORY: Past Surgical History  Procedure Laterality Date  . Dilation and curettage of uterus  01/04/11  . Breast mass excision  2013  . Tonsillectomy    . Portacath placement  04/12/2011    Procedure: INSERTION PORT-A-CATH;  Surgeon: Wilmon Arms. Tsuei, MD;  Location: [facility];  Service: General;  Laterality: Right;  right subclavian  . Incision and drainage breast abscess  03/31/11    FAMILY  HISTORY Family History  Problem Relation Age of Onset  . Diabetes Mother   . Hypertension Mother   [name] died from post-operative complications at age 23; the patient's mother is 42, lives in [place] The patient is an only child.   GYNECOLOGIC HISTORY: First pregnancy to term age 20. Her menstrual cycles were interrupted with chemotherapy, but resumed in 05/2012.  She has not had a menses since her menstrual cycle in 05/2012.   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 area. She has a good deal of family in this area. Her husband Alyssa May is a Hospital doctor for a sedan service in [place] he works there 15 days, and then returns home for another 15 days. Currently he is considering a similar job in [place], [name]. Their daughter, Alyssa May, is being home schooled.      ADVANCED DIRECTIVES: Not in place   HEALTH MAINTENANCE: History  Substance Use Topics  . Smoking status: Never Smoker   . Smokeless tobacco: Never Used  . Alcohol Use: Yes     Comment: occasionally wine     Colonoscopy:  N/A  PAP: 03/18/2011  Bone density: N/A  Lipid panel: Not on file   Allergies  Allergen Reactions  . Sulfa Antibiotics Swelling    Current Outpatient Prescriptions  Medication Sig Dispense Refill  . amoxicillin (AMOXIL) 500 MG capsule Take 4 capsules (2,000 mg total) by mouth once.  4 capsule  1  . B Complex CAPS Take 1 capsule by mouth daily.      . Cholecalciferol (VITAMIN D3) 2000 UNITS TABS Take 1 tablet by mouth daily.      . ciprofloxacin (CIPRO) 500 MG tablet Take 1 tablet (500 mg total) by mouth 2 (two) times daily.  14 tablet  0  . fluconazole (DIFLUCAN) 100 MG tablet Take 1 tablet (100 mg total) by mouth daily.  3 tablet  0  . GLUCOSAMINE CHONDROITIN COMPLX PO Take 1 each by mouth daily. Liquid glucosamine chondroitin      . lidocaine-prilocaine (EMLA) cream Apply 1 application topically as needed. To  port  30 g  1  . LORazepam (ATIVAN) 0.5 MG tablet Take 1 tablet (0.5 mg total) by mouth 2 (two) times daily as needed for anxiety.  20 tablet  0  . metoCLOPramide (REGLAN) 10 MG tablet Take 1 tablet (10 mg total) by mouth 4 (four) times daily -  before meals and at bedtime.  120 tablet  6  . omeprazole (PRILOSEC) 20 MG capsule Take 1 capsule (20 mg total) by mouth 2 (two) times daily.  60 capsule  2  . ondansetron (ZOFRAN) 8 MG tablet Take 1 tablet (8 mg total) by mouth every 8 (eight) hours as needed for nausea.  30 tablet  1  . oxyCODONE-acetaminophen (PERCOCET/ROXICET) 5-325 MG per tablet Take 1 tablet by mouth every 8 (eight) hours as needed for pain.  90 tablet  0  . promethazine (PHENERGAN) 25 MG suppository Place  1 suppository (25 mg total) rectally every 6 (six) hours as needed for nausea.  6 each  6   No current facility-administered medications for this visit.    OBJECTIVE:  Middle-aged [place] American woman in no acute distress  Filed Vitals:   01/28/13 1446  BP: 108/74  Pulse: 92  Temp: 98.4 F (36.9 C)  Resp: 18     Body mass index is 28.89 kg/(m^2).    ECOG FS: 1 - Symptomatic but completely ambulatory   Filed Weights   01/28/13 1446  Weight: 173 lb 9.6 oz (78.744 kg)    General appearance: Alert, cooperative, well nourished, no apparent distress Head: Normocephalic, chemotherapy-induced alopecia, atraumatic, missing dentition Eyes: Conjunctivae/corneas clear, PERRLA, EOMI Nose: Nares, septum and mucosa are normal, no drainage or sinus tenderness Neck: No adenopathy, supple, symmetrical, trachea midline, no tenderness Resp: Clear to auscultation bilaterally, no wheezes/rales/rhonchi Cardio: Regular rate and rhythm, S1, S2 normal, no murmur, click, rub or gallop, no edema, right chest Port-A-Cath covered in EMLA cream and with occlusive dressing Breasts: Left breast and axilla area has fixed firm tumor mass, covered with occlusive dressing GI: Soft, distended,  non-tender, hypoactive bowel sounds, no organomegaly Skin: No rashes/lesions, skin warm and dry, no erythematous areas, no cyanosis  M/S:  Atraumatic, normal strength in all extremities, normal range of motion, no clubbing  Lymph nodes: Cervical and supraclavicular nodes are normal Neurologic: Grossly normal, cranial nerves II through XII intact, alert and oriented x 3 Psych: Appropriate affect   LAB RESULTS: Lab Results  Component Value Date   WBC 5.4 01/28/2013   NEUTROABS 3.7 01/28/2013   HGB 11.3* 01/28/2013   HCT 34.8 01/28/2013   MCV 92.3 01/28/2013   PLT 372 01/28/2013       Chemistry      Component Value Date/Time   NA 139 01/28/2013 1416   NA 138 03/16/2012 1208   K 3.9 01/28/2013 1416   K 4.1 03/16/2012 1208   CL 103 07/05/2012 1448   CL 97 03/16/2012 1208   CO2 27 01/28/2013 1416   CO2 25 03/16/2012 1208   BUN 13.3 01/28/2013 1416   BUN 18 03/16/2012 1208   CREATININE 0.9 01/28/2013 1416   CREATININE 0.88 03/16/2012 1208      Component Value Date/Time   CALCIUM 9.4 01/28/2013 1416   CALCIUM 9.9 03/16/2012 1208   ALKPHOS 63 01/28/2013 1416   ALKPHOS 44 09/22/2011 1006   AST 25 01/28/2013 1416   AST 17 09/22/2011 1006   ALT 11 01/28/2013 1416   ALT 12 09/22/2011 1006   BILITOT 0.33 01/28/2013 1416   BILITOT 0.2* 09/22/2011 1006       STUDIES: No results found.   ASSESSMENT: Alyssa May is a 47 y.o.  [place], [name] woman with stage IV breast cancer (liver involvement at presentation but no bone, lung or brain spread):  (1)  Status post upper outer quadrant left breast biopsy 03/18/2011 showing a clinical T4,N1 invasive ductal carcinoma, high grade, triple negative, with an Mib-1 of 97%, with a single liver metastasis pathologically confirmed 03/31/2011.  (2) Status post 4 cycles of Qthree-week docetaxel/ doxorubicin/cyclophosphamide in the neoadjuvant setting, discontinued after 4 cycles due to peripheral neuropathy.  Status post 2 cycles of every 3  week carboplatin/gemcitabine, for a total of 6 neoadjuvant cycles altogether, completed 11/09/2011.  (3) The patient refused surgery.  (4) With subsequent tumor re-growth, started Carboplatin/Gemcitabine on 09/10/2012, with treatment on days 1 and 8 of each 21 day cycle, 4-6  cycles planned before definitive surgery.  (5) Tooth extractions without complication on 12/13/2012 for dental issues.  (6) Urinary tract infection with possible candida infection    PLAN:  Alyssa May will proceed with chemotherapy day 1 of cycle 6 consisting of Carboplatinum/Gemcitabine. Neulasta injection for granulocyte support is scheduled for 01/29/2013.   Once again, the plan is to proceed with 6 cycles of chemotherapy prior to definitive surgery. We'll continue to see her for labs and physical exam on each treatment day. She'll continue to receive Neulasta injections on day 9 of each cycle. She is also scheduled to receive 500 mL of normal saline via IV during chemotherapy cycles of Gemcitabine/Carboplatin infusions for dehydration.  She will have restaging scans after the completion of cycle 6 to include CTs of the chest/abdomen/pelvis on approximately 02/05/2013.  She will see Dr. Darnelle Catalan soon thereafter to assess her response and decide whether or not to proceed with surgery or to proceed with 2 additional cycles of chemotherapy for a total of 8.   An electronic prescription for ciprofloxacin 500 mg by mouth twice a day x 7 days and Diflucan 100 mg by mouth daily x 3 days following antibiotic therapy was sent to the patient's pharmacy for urinary tract infection.  We plan to see Alyssa May again on 02/04/2013 for scheduled chemotherapy cycle #6/day 8. Laboratories of CBC and CMP will be checked at that time.  All questions answered.  Alyssa May was encouraged to contact us in the interim with any problems, questions, or concerns.   Larina Bras, NP-C  01/29/2013 5:45 PM

## 2013-01-28 NOTE — Patient Instructions (Addendum)
Please contact us at [phone] if you have any questions or concerns.  Please continue to do well and enjoy life!!!  Get plenty of rest, drink plenty of water, exercise daily, eat a balanced diet.  Keep all appointments.   Have Scans.  Have Surgery.  Results for orders placed in visit on 01/28/13 (from the past 24 hour(s))  CBC WITH DIFFERENTIAL     Status: Abnormal   Collection Time    01/28/13  2:12 PM      Result Value Range   WBC 5.4  3.9 - 10.3 10e3/uL   NEUT# 3.7  1.5 - 6.5 10e3/uL   HGB 11.3 (*) 11.6 - 15.9 g/dL   HCT 40.9  81.1 - 91.4 %   Platelets 372  145 - 400 10e3/uL   MCV 92.3  79.5 - 101.0 fL   MCH 29.9  25.1 - 34.0 pg   MCHC 32.4  31.5 - 36.0 g/dL   RBC 7.82  9.56 - 2.13 10e6/uL   RDW 20.4 (*) 11.2 - 14.5 %   lymph# 1.1  0.9 - 3.3 10e3/uL   MONO# 0.5  0.1 - 0.9 10e3/uL   Eosinophils Absolute 0.1  0.0 - 0.5 10e3/uL   Basophils Absolute 0.0  0.0 - 0.1 10e3/uL   NEUT% 68.4  38.4 - 76.8 %   LYMPH% 19.8  14.0 - 49.7 %   MONO% 10.2  0.0 - 14.0 %   EOS% 1.1  0.0 - 7.0 %   BASO% 0.5  0.0 - 2.0 %   Narrative:    Performed At:  [facility]               [address]               [place], [place] [address]

## 2013-01-28 NOTE — Patient Instructions (Signed)
[facility] Discharge Instructions for Patients Receiving Chemotherapy  Today you received the following chemotherapy agents: Carboplatin and Gemzar. To help prevent nausea and vomiting after your treatment, we encourage you to take your nausea medication, Zofran. Take one twice daily for 2 days. Take Phenergan every 6 hours as needed.    If you develop nausea and vomiting that is not controlled by your nausea medication, call the clinic.   BELOW ARE SYMPTOMS THAT SHOULD BE REPORTED IMMEDIATELY:  *FEVER GREATER THAN 100.5 F  *CHILLS WITH OR WITHOUT FEVER  NAUSEA AND VOMITING THAT IS NOT CONTROLLED WITH YOUR NAUSEA MEDICATION  *UNUSUAL SHORTNESS OF BREATH  *UNUSUAL BRUISING OR BLEEDING  TENDERNESS IN MOUTH AND THROAT WITH OR WITHOUT PRESENCE OF ULCERS  *URINARY PROBLEMS  *BOWEL PROBLEMS  UNUSUAL RASH Items with * indicate a potential emergency and should be followed up as soon as possible.  Feel free to call the clinic should you have any questions or concerns. The clinic phone number is [phone].

## 2013-01-30 ENCOUNTER — Telehealth: Payer: Self-pay | Admitting: Family

## 2013-01-30 NOTE — Telephone Encounter (Signed)
Email to Zollie Scale and Dr Darnelle Catalan as regards Alyssa May 11/24 POF for this patient  No appts avail for either on 12/1 Need further direction shh

## 2013-02-01 ENCOUNTER — Telehealth: Payer: Self-pay | Admitting: Oncology

## 2013-02-01 NOTE — Telephone Encounter (Signed)
LVMM for Alyssa May in Dr. Darrall Dears ofc requesting reply to 11/26 email about 12/1 appts left Dionne Milo contact info since I am leaving early today shh

## 2013-02-01 NOTE — Telephone Encounter (Signed)
Following for [facility]. No response from [facility] re 12/1 f/u appt. Per desk nurse add only lb/inf 12/1 being that 11/24 pof says f/u appt if poss. Added lb/inf 12/1. S/w pt she is aware. Other appts remain the same. Ok per helen to add inf 12/1.

## 2013-02-02 ENCOUNTER — Other Ambulatory Visit: Payer: Self-pay | Admitting: Oncology

## 2013-02-04 ENCOUNTER — Other Ambulatory Visit (HOSPITAL_BASED_OUTPATIENT_CLINIC_OR_DEPARTMENT_OTHER): Payer: Medicaid Other

## 2013-02-04 ENCOUNTER — Other Ambulatory Visit: Payer: Self-pay | Admitting: Oncology

## 2013-02-04 ENCOUNTER — Other Ambulatory Visit: Payer: Self-pay | Admitting: *Deleted

## 2013-02-04 ENCOUNTER — Telehealth: Payer: Self-pay | Admitting: Oncology

## 2013-02-04 ENCOUNTER — Ambulatory Visit (HOSPITAL_BASED_OUTPATIENT_CLINIC_OR_DEPARTMENT_OTHER): Payer: Medicaid Other

## 2013-02-04 ENCOUNTER — Ambulatory Visit (HOSPITAL_BASED_OUTPATIENT_CLINIC_OR_DEPARTMENT_OTHER): Payer: Medicaid Other | Admitting: Oncology

## 2013-02-04 VITALS — BP 110/63 | HR 77 | Temp 98.2°F | Resp 18

## 2013-02-04 DIAGNOSIS — C50412 Malignant neoplasm of upper-outer quadrant of left female breast: Secondary | ICD-10-CM

## 2013-02-04 DIAGNOSIS — D709 Neutropenia, unspecified: Secondary | ICD-10-CM

## 2013-02-04 DIAGNOSIS — C50419 Malignant neoplasm of upper-outer quadrant of unspecified female breast: Secondary | ICD-10-CM

## 2013-02-04 DIAGNOSIS — Z5189 Encounter for other specified aftercare: Secondary | ICD-10-CM

## 2013-02-04 DIAGNOSIS — D72819 Decreased white blood cell count, unspecified: Secondary | ICD-10-CM

## 2013-02-04 LAB — CBC WITH DIFFERENTIAL/PLATELET
Eosinophils Absolute: 0 10*3/uL (ref 0.0–0.5)
HCT: 34.5 % — ABNORMAL LOW (ref 34.8–46.6)
HGB: 10.9 g/dL — ABNORMAL LOW (ref 11.6–15.9)
LYMPH%: 51.8 % — ABNORMAL HIGH (ref 14.0–49.7)
MONO#: 0.1 10*3/uL (ref 0.1–0.9)
NEUT#: 0.7 10*3/uL — ABNORMAL LOW (ref 1.5–6.5)
NEUT%: 42 % (ref 38.4–76.8)
Platelets: 222 10*3/uL (ref 145–400)
WBC: 1.7 10*3/uL — ABNORMAL LOW (ref 3.9–10.3)

## 2013-02-04 LAB — COMPREHENSIVE METABOLIC PANEL (CC13)
ALT: 35 U/L (ref 0–55)
AST: 31 U/L (ref 5–34)
Albumin: 3.7 g/dL (ref 3.5–5.0)
Alkaline Phosphatase: 62 U/L (ref 40–150)
BUN: 13.2 mg/dL (ref 7.0–26.0)
CO2: 30 mEq/L — ABNORMAL HIGH (ref 22–29)
Chloride: 102 mEq/L (ref 98–109)
Total Bilirubin: 0.32 mg/dL (ref 0.20–1.20)

## 2013-02-04 MED ORDER — PEGFILGRASTIM INJECTION 6 MG/0.6ML
6.0000 mg | Freq: Once | SUBCUTANEOUS | Status: AC
  Administered 2013-02-04: 6 mg via SUBCUTANEOUS
  Filled 2013-02-04: qty 0.6

## 2013-02-04 NOTE — Patient Instructions (Signed)

## 2013-02-04 NOTE — Progress Notes (Signed)
Alyssa May came today for chemotherapy treatment. However, her neutrophils are low. In addition, she is having bleeding from her left breast fungating mass. She has been changing the dressing several times a day.  I went back to the treatment area and examined the mass. It is oozing, not spurting. Nevertheless I don't think we're going to be able to control his bleeding very easily. I am going to see if we can get the wound nurse to evaluate. [The wound nurse at [facility] meeting until later in the day, but she made some suggestions, and these were implemented)  I discussed the case again with Dr. Corliss Skains. He is ready to perform surgery. I am going to give Alyssa May Neulasta today to make sure she has adequate white cells for the upcoming procedure. Her hemoglobin and platelets are already adequate. I am hoping she can get her surgery done next week. We are going to move up her CT scan to this week to help with surgical planning.  All this was discussed with Alyssa May. She is aware of the plan and is in agreement with that. She knows to call for any problems that may develop before next visit here.

## 2013-02-04 NOTE — Progress Notes (Unsigned)
Alyssa May came today for chemotherapy treatment. However, her neutrophils are low. In addition, she is having bleeding from her left breast fungating mass. She has been changing the dressing several times a day.  I went back to the treatment area and examined the mass. It is losing, not spurting. Nevertheless I don't think we're going to be able to control his bleeding very easily. I am going to see if we can get the wound nurse to evaluated. We will try some Silvadene and pressure.  I discussed the case again with Dr. Carolynn Sayers. He is ready to perform surgery. I am going to give [name] and Neulasta today to make sure she has plenty white cells. Her hemoglobin and platelets already adequate. I am hoping she can get her surgery done next week. We are going to move up her CT scan to this week to help with surgical planning.  All this was discussed with [name]. She is aware of the plan and is in agreement with that. She knows to call for any problems that may develop before next visit here.

## 2013-02-04 NOTE — Telephone Encounter (Signed)
per 12/1 pof added work in appt to [employer] schedule today - pt seen in tx area. also per 12/1 pof ct this week not 12/14. pt on schedule for ct 12/2 not 12/14

## 2013-02-04 NOTE — Progress Notes (Signed)
ANC 0.7 today. Per Dr. Darnelle Catalan, hold chemotherapy.   Patient reports "my tumor is bleeding" and has large gauze/tape dressing to upper outer left breast under axillae. Dressing removed to assess by this RN. Blood oozing from site, saturating a stack of 4x4 gauze. Dr. Darnelle Catalan called to assess. Per Dr. Darnelle Catalan, this site needs silvadene and pressure dressing. No silvadene available in [facility]; site dressed with a pressure dressing consisting of non-adherent gauze, ABD pad, and medipore tape. Patient denies pain. She states that this has persisted for over 1 week but saw surgeon the Friday before Thanksgiving. The bleeding has increased over the last 3 days, requiring dressing changes at home BID. She has been applying dakin's solution but is running out. Dr. Darnelle Catalan aware of the above. He explained to the patient that he wants to call her surgeon to assess availability to get her in to office today. Patient verbalizes understanding of the plan.

## 2013-02-05 ENCOUNTER — Other Ambulatory Visit: Payer: Self-pay | Admitting: *Deleted

## 2013-02-05 ENCOUNTER — Ambulatory Visit (HOSPITAL_COMMUNITY)
Admission: RE | Admit: 2013-02-05 | Discharge: 2013-02-05 | Disposition: A | Discharge status: Home / Self Care | Payer: Medicaid Other | Source: Ambulatory Visit | Attending: Physician Assistant | Admitting: Physician Assistant

## 2013-02-05 ENCOUNTER — Other Ambulatory Visit: Payer: Self-pay | Admitting: Physician Assistant

## 2013-02-05 ENCOUNTER — Encounter (HOSPITAL_COMMUNITY): Payer: Self-pay

## 2013-02-05 DIAGNOSIS — C50412 Malignant neoplasm of upper-outer quadrant of left female breast: Secondary | ICD-10-CM

## 2013-02-05 DIAGNOSIS — R234 Changes in skin texture: Secondary | ICD-10-CM | POA: Insufficient documentation

## 2013-02-05 DIAGNOSIS — C50912 Malignant neoplasm of unspecified site of left female breast: Secondary | ICD-10-CM

## 2013-02-05 DIAGNOSIS — R599 Enlarged lymph nodes, unspecified: Secondary | ICD-10-CM | POA: Insufficient documentation

## 2013-02-05 DIAGNOSIS — N63 Unspecified lump in unspecified breast: Secondary | ICD-10-CM | POA: Insufficient documentation

## 2013-02-05 DIAGNOSIS — C50919 Malignant neoplasm of unspecified site of unspecified female breast: Secondary | ICD-10-CM | POA: Insufficient documentation

## 2013-02-05 MED ORDER — IOHEXOL 300 MG/ML  SOLN
100.0000 mL | Freq: Once | INTRAMUSCULAR | Status: AC | PRN
  Administered 2013-02-05: 100 mL via INTRAVENOUS

## 2013-02-05 MED ORDER — DAKINS (1/2 STRENGTH) 0.25 % EX SOLN: CUTANEOUS | Status: DC

## 2013-02-05 NOTE — Progress Notes (Signed)
Patient here today as walk in to talk about plans for surgery and concerns for left breast wound. She needs help with dressing and assessing wound at home due to surgery is not going to be occurring until the end of the month. Since patient is compromised from chemo, we feel that home health services would be best to keep patient out of public and safe from infection until surgery can be performed. We will continue plan with suggestions from [facility] wound care nurse, as the wound today looks well and less drainage after following suggestions of yesterday with Dakins solution and Calcium Alginate dressing.  Spoke with Jacki Cones, [facility], she will be intouch with patient to help arrange this.

## 2013-02-07 ENCOUNTER — Telehealth: Payer: Self-pay | Admitting: *Deleted

## 2013-02-07 NOTE — Telephone Encounter (Signed)
F/U with patient to make sure Homehealth Care is coming to home to do wound care to the left breast area. Patient said they have. Came yesterday Dec. 3, 2014. Patient stated the wound is looking better.

## 2013-02-08 ENCOUNTER — Other Ambulatory Visit (HOSPITAL_COMMUNITY): Payer: Medicaid Other

## 2013-02-11 ENCOUNTER — Other Ambulatory Visit (HOSPITAL_BASED_OUTPATIENT_CLINIC_OR_DEPARTMENT_OTHER): Payer: Medicaid Other | Admitting: Lab

## 2013-02-11 ENCOUNTER — Ambulatory Visit (HOSPITAL_BASED_OUTPATIENT_CLINIC_OR_DEPARTMENT_OTHER): Payer: Medicaid Other | Admitting: Oncology

## 2013-02-11 VITALS — BP 106/70 | HR 90 | Temp 98.2°F | Resp 18 | Ht 65.0 in | Wt 174.3 lb

## 2013-02-11 DIAGNOSIS — N390 Urinary tract infection, site not specified: Secondary | ICD-10-CM

## 2013-02-11 DIAGNOSIS — C787 Secondary malignant neoplasm of liver and intrahepatic bile duct: Secondary | ICD-10-CM

## 2013-02-11 DIAGNOSIS — C50412 Malignant neoplasm of upper-outer quadrant of left female breast: Secondary | ICD-10-CM

## 2013-02-11 DIAGNOSIS — C50419 Malignant neoplasm of upper-outer quadrant of unspecified female breast: Secondary | ICD-10-CM

## 2013-02-11 DIAGNOSIS — H538 Other visual disturbances: Secondary | ICD-10-CM

## 2013-02-11 LAB — COMPREHENSIVE METABOLIC PANEL (CC13)
ALT: 17 U/L (ref 0–55)
Anion Gap: 10 mEq/L (ref 3–11)
BUN: 9.4 mg/dL (ref 7.0–26.0)
CO2: 25 mEq/L (ref 22–29)
Calcium: 9.2 mg/dL (ref 8.4–10.4)
Chloride: 103 mEq/L (ref 98–109)
Creatinine: 0.9 mg/dL (ref 0.6–1.1)
Sodium: 138 mEq/L (ref 136–145)
Total Protein: 7.8 g/dL (ref 6.4–8.3)

## 2013-02-11 LAB — CBC WITH DIFFERENTIAL/PLATELET
BASO%: 0.1 % (ref 0.0–2.0)
Basophils Absolute: 0 10*3/uL (ref 0.0–0.1)
HCT: 32.7 % — ABNORMAL LOW (ref 34.8–46.6)
HGB: 10.7 g/dL — ABNORMAL LOW (ref 11.6–15.9)
MCH: 29.8 pg (ref 25.1–34.0)
MCHC: 32.7 g/dL (ref 31.5–36.0)
MONO#: 1.3 10*3/uL — ABNORMAL HIGH (ref 0.1–0.9)
NEUT#: 11.8 10*3/uL — ABNORMAL HIGH (ref 1.5–6.5)
NEUT%: 81.2 % — ABNORMAL HIGH (ref 38.4–76.8)
RBC: 3.58 10*6/uL — ABNORMAL LOW (ref 3.70–5.45)
RDW: 19.9 % — ABNORMAL HIGH (ref 11.2–14.5)
WBC: 14.5 10*3/uL — ABNORMAL HIGH (ref 3.9–10.3)
lymph#: 1.4 10*3/uL (ref 0.9–3.3)

## 2013-02-11 MED ORDER — OXYCODONE-ACETAMINOPHEN 5-325 MG PO TABS: 1.0000 | ORAL_TABLET | Freq: Three times a day (TID) | ORAL | Status: DC | PRN

## 2013-02-11 NOTE — Progress Notes (Signed)
[facility]  Telephone:([phone]) [phone] Fax:([phone]) [phone]  OFFICE PROGRESS NOTE     ID: Alyssa May   DOB: Feb 26, 1966  MR#: [id number]  JYN#:[id number]   QMV:HQIONGEX Not In System SU: Alyssa Rudd, MD RAD ONC: Alyssa Peak, MD    HISTORY OF PRESENT ILLNESS: Alyssa May is a 47 y.o. Alyssa May woman who first noted a small mass in her left breast 01/2010, shortly after she stopped breast-feeding.  It did not disappear with resumption of her periods and grew over the next year, and particularly since she had her miscarrriage in 12/2010. This brought her to the Emergency Room 03/09/2011 where she was found to have a fluctuant area in the UOQ of the left breast measuring 8.4 cm. The tissue beneath the cystic area extending towards the axilla and medially was described as firm, but not erythematous. The cystic area was lanced and a large amount of serosanguinos material was expressed. The patient was referred to Dr. Marcille May who saw her 03/11/2011. By this time the fluid had largely reaccumulated. He incised the lesion, again draining serosanguinous liquid, packed it, and set the patient up for [facility] breast [facility] 03/14/2011. This showed an irregularly marginated mass measuring 7.3 cm, with abnormal axillary adenopathy. On 01/1102013 bilateral mammography confirmed a high-density mass in the left UOQ. This was biopsied, as was a left axillary lymph node. Both biopsies ([id number]) showed invasive ductal carcinoma, high grade, triple negative, with an Mib-1 of 97%.  Her subsequent history is as detailed below.  INTERVAL HISTORY: Alyssa May returns today with her husband Alyssa May and their daughter Alyssa May. Since her last visit here she has been back and forth to [place] several times. She also had restaging studies which show on the one hand that the tumor in the left breast is growing, but on the other hand no obvious abnormality now in the liver.   REVIEW OF  SYSTEMS: Raima tolerated her chemotherapy remarkably well. For the last several weeks ago she has had some initially losing and then bleeding from the left breast mass. She is using a gel as well as a mild pressure dressing and that is "holding it". She did have more pain in the left breast this past weekend and off but she took 2 Percocet one time. This gave her however considerably nausea and she vomited several times. The pain is better today. She has intermittently blurred vision associated with her treatments. She has a runny nose, but no cough, pleurisy, or hemoptysis. She does not feel short of breath. She feels forgetful but not anxious and depressed. Hot flashes are moderate. A detailed review of systems today was otherwise stable  PAST MEDICAL HISTORY: Past Medical History  Diagnosis Date  . Breast abscess 03/2011  . History of bronchitis     last time OCt-Nov 2012  . Seizures     as a toddler ,but none since then.  . Joint pain   . History of blood transfusion     in May 2013-no abnormal reaction noted  . Cancer     Left Breast    PAST SURGICAL HISTORY: Past Surgical History  Procedure Laterality Date  . Dilation and curettage of uterus  01/04/11  . Breast mass excision  2013  . Tonsillectomy    . Portacath placement  04/12/2011    Procedure: INSERTION PORT-A-CATH;  Surgeon: Alyssa Arms. Tsuei, MD;  Location: [facility] ORS;  Service: General;  Laterality: Right;  right subclavian  . Incision and  drainage breast abscess  03/31/11    FAMILY HISTORY Family History  Problem Relation Age of Onset  . Diabetes Mother   . Hypertension Mother   [name] died from post-operative complications at age 68; the patient's mother is 42, lives in [place] The patient is an only child.   GYNECOLOGIC HISTORY: First pregnancy to term age 38. Her menstrual cycles were interrupted with chemotherapy, but resumed in 05/2012.  She has not had a menses since her menstrual cycle in  05/2012.   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 area. She has a good deal of family in this area. Her husband Alyssa May is a Hospital doctor for a sedan service in [place] he works there 15 days, and then returns home for another 15 days. Currently he is considering a similar job in [place], [name]. Their daughter, Alyssa May, is being home schooled.      ADVANCED DIRECTIVES: Not in place   HEALTH MAINTENANCE: History  Substance Use Topics  . Smoking status: Never Smoker   . Smokeless tobacco: Never Used  . Alcohol Use: Yes     Comment: occasionally wine     Colonoscopy:  N/A  PAP: 03/18/2011  Bone density: N/A  Lipid panel: Not on file   Allergies  Allergen Reactions  . Sulfa Antibiotics Swelling    Current Outpatient Prescriptions  Medication Sig Dispense Refill  . amoxicillin (AMOXIL) 500 MG capsule Take 4 capsules (2,000 mg total) by mouth once.  4 capsule  1  . B Complex CAPS Take 1 capsule by mouth daily.      . Cholecalciferol (VITAMIN D3) 2000 UNITS TABS Take 1 tablet by mouth daily.      . ciprofloxacin (CIPRO) 500 MG tablet Take 1 tablet (500 mg total) by mouth 2 (two) times daily.  14 tablet  0  . fluconazole (DIFLUCAN) 100 MG tablet Take 1 tablet (100 mg total) by mouth daily.  3 tablet  0  . GLUCOSAMINE CHONDROITIN COMPLX PO Take 1 each by mouth daily. Liquid glucosamine chondroitin      . lidocaine-prilocaine (EMLA) cream Apply 1 application topically as needed. To port  30 g  1  . LORazepam (ATIVAN) 0.5 MG tablet Take 1 tablet (0.5 mg total) by mouth 2 (two) times daily as needed for anxiety.  20 tablet  0  . metoCLOPramide (REGLAN) 10 MG tablet Take 1 tablet (10 mg total) by mouth 4 (four) times daily -  before meals and at bedtime.  120 tablet  6  . omeprazole (PRILOSEC) 20 MG capsule Take 1 capsule (20 mg total) by mouth 2 (two) times daily.  60 capsule  2  . ondansetron (ZOFRAN) 8 MG tablet Take 1  tablet (8 mg total) by mouth every 8 (eight) hours as needed for nausea.  30 tablet  1  . oxyCODONE-acetaminophen (PERCOCET/ROXICET) 5-325 MG per tablet Take 1 tablet by mouth every 8 (eight) hours as needed for pain.  90 tablet  0  . promethazine (PHENERGAN) 25 MG suppository Place 1 suppository (25 mg total) rectally every 6 (six) hours as needed for nausea.  6 each  6  . sodium hypochlorite (DAKIN'S 1/2 STRENGTH) external solution Apply irrigate twice daily or as directed  473 mL  3   No current facility-administered medications for this visit.   Objective: Middle-aged [place] American woman who looks younger than stated age  29 Vitals:   02/11/13 1145  BP:  106/70  Pulse: 90  Temp: 98.2 F (36.8 C)  Resp: 18     Body mass index is 29.01 kg/(m^2).    ECOG FS: 1 - Symptomatic but completely ambulatory   Filed Weights   02/11/13 1145  Weight: 174 lb 4.8 oz (79.062 kg)    Sclerae unicteric, pupils equal and reactive Oropharynx clear and moist-- no thrush No cervical or supraclavicular adenopathy Lungs no rales or rhonchi Heart regular rate and rhythm Abd soft, nontender, positive bowel sounds MSK no focal spinal tenderness, no upper extremity lymphedema Neuro: nonfocal, well oriented, appropriate affect Breasts: The right breast is unremarkable. The left breast is bandaged over. Advantages clean and dry. There is a little bit of blood that one can see 2 or 3 layers down. The left axilla shows palpable adenopathy, which is movable.     LAB RESULTS: Lab Results  Component Value Date   WBC 14.5* 02/11/2013   NEUTROABS 11.8* 02/11/2013   HGB 10.7* 02/11/2013   HCT 32.7* 02/11/2013   MCV 91.1 02/11/2013   PLT 81* 02/11/2013       Chemistry      Component Value Date/Time   NA 141 02/04/2013 1346   NA 138 03/16/2012 1208   K 3.9 02/04/2013 1346   K 4.1 03/16/2012 1208   CL 103 07/05/2012 1448   CL 97 03/16/2012 1208   CO2 30* 02/04/2013 1346   CO2 25 03/16/2012 1208   BUN 13.2  02/04/2013 1346   BUN 18 03/16/2012 1208   CREATININE 0.8 02/04/2013 1346   CREATININE 0.88 03/16/2012 1208      Component Value Date/Time   CALCIUM 9.4 02/04/2013 1346   CALCIUM 9.9 03/16/2012 1208   ALKPHOS 62 02/04/2013 1346   ALKPHOS 44 09/22/2011 1006   AST 31 02/04/2013 1346   AST 17 09/22/2011 1006   ALT 35 02/04/2013 1346   ALT 12 09/22/2011 1006   BILITOT 0.32 02/04/2013 1346   BILITOT 0.2* 09/22/2011 1006       STUDIES: Ct Chest W Contrast  02/05/2013   CLINICAL DATA:  Breast cancer followup  EXAM: CT CHEST, ABDOMEN, AND PELVIS WITH CONTRAST  TECHNIQUE: Multidetector CT imaging of the chest, abdomen and pelvis was performed following the standard protocol during bolus administration of intravenous contrast.  CONTRAST:  OMNIPAQUE IOHEXOL 300 MG/ML  SOLN  COMPARISON:  None.  FINDINGS: CT CHEST FINDINGS  There is no pleural effusion identified. No airspace consolidation or atelectasis identified. Mild cardiac enlargement. No pericardial effusion. No suspicious mediastinal or hilar adenopathy identified.  Asymmetric left-sided skin thickening overlies the left breast. Enhancing in heterogeneous mass within the periphery of the left breast measures 6.8 x 4.6 cm, image 29/series 2. On the previous exam this measured 4.8 x 3.2 cm. Pedunculated and ulcerative component to this mass measures 3.3 cm, image 22/series 2. Enlarged left axillary lymph nodes are again noted. Index node measures 2.3 cm, image 17/series 2. This is compared with 1.2 cm previously.  No supraclavicular or right axillary adenopathy. No internal mammary adenopathy identified. Review of the visualized osseous structures shows no aggressive lytic or sclerotic bone lesions.  CT ABDOMEN AND PELVIS FINDINGS  There is no focal liver abnormality. The gallbladder appears within normal limits. No biliary dilatation. Normal appearance of the pancreas. The spleen is normal.  The adrenal glands are both normal. The left kidney is normal. The  right kidney is also normal. The urinary bladder is unremarkable. The uterus and adnexal structures are negative.  Normal caliber of the abdominal aorta. No aneurysm. There is no upper abdominal adenopathy noted. No pelvic or inguinal adenopathy noted.  No free fluid or abnormal fluid collections identified.  The stomach appears normal. The small bowel loops have a normal course and caliber and there is no obstruction.  Normal appearance of the colon. Review of the visualized bony structures shows changes of osteitis pubis, image 104/series 2. No aggressive lytic or sclerotic bone lesions identified.  IMPRESSION: CT chest:  1. Increase in size of large, partially ulcerative mass involving the lateral aspect of the left breast. 2. Increase in left axillary adenopathy. CT abdomen and pelvis:  1. No specific features identified to suggest metastatic disease.   Electronically Signed   By: Signa Kell M.D.   On: 02/05/2013 16:26   Ct Abdomen Pelvis W Contrast  02/05/2013   CLINICAL DATA:  Breast cancer followup  EXAM: CT CHEST, ABDOMEN, AND PELVIS WITH CONTRAST  TECHNIQUE: Multidetector CT imaging of the chest, abdomen and pelvis was performed following the standard protocol during bolus administration of intravenous contrast.  CONTRAST:  OMNIPAQUE IOHEXOL 300 MG/ML  SOLN  COMPARISON:  None.  FINDINGS: CT CHEST FINDINGS  There is no pleural effusion identified. No airspace consolidation or atelectasis identified. Mild cardiac enlargement. No pericardial effusion. No suspicious mediastinal or hilar adenopathy identified.  Asymmetric left-sided skin thickening overlies the left breast. Enhancing in heterogeneous mass within the periphery of the left breast measures 6.8 x 4.6 cm, image 29/series 2. On the previous exam this measured 4.8 x 3.2 cm. Pedunculated and ulcerative component to this mass measures 3.3 cm, image 22/series 2. Enlarged left axillary lymph nodes are again noted. Index node measures 2.3 cm,  image 17/series 2. This is compared with 1.2 cm previously.  No supraclavicular or right axillary adenopathy. No internal mammary adenopathy identified. Review of the visualized osseous structures shows no aggressive lytic or sclerotic bone lesions.  CT ABDOMEN AND PELVIS FINDINGS  There is no focal liver abnormality. The gallbladder appears within normal limits. No biliary dilatation. Normal appearance of the pancreas. The spleen is normal.  The adrenal glands are both normal. The left kidney is normal. The right kidney is also normal. The urinary bladder is unremarkable. The uterus and adnexal structures are negative.  Normal caliber of the abdominal aorta. No aneurysm. There is no upper abdominal adenopathy noted. No pelvic or inguinal adenopathy noted.  No free fluid or abnormal fluid collections identified.  The stomach appears normal. The small bowel loops have a normal course and caliber and there is no obstruction.  Normal appearance of the colon. Review of the visualized bony structures shows changes of osteitis pubis, image 104/series 2. No aggressive lytic or sclerotic bone lesions identified.  IMPRESSION: CT chest:  1. Increase in size of large, partially ulcerative mass involving the lateral aspect of the left breast. 2. Increase in left axillary adenopathy. CT abdomen and pelvis:  1. No specific features identified to suggest metastatic disease.   Electronically Signed   By: Signa Kell M.D.   On: 02/05/2013 16:26    ASSESSMENT: Mrs. Su Hilt is a 47 y.o.  [place], [name] woman with stage IV breast cancer (liver involvement at presentation but no bone, lung or brain spread):  (1)  Status post upper outer quadrant left breast biopsy 03/18/2011 showing a clinical T4,N1 invasive ductal carcinoma, high grade, triple negative, with an Mib-1 of 97%, with a single liver metastasis pathologically confirmed 03/31/2011.  (2) Status post  4 cycles of Q three-week docetaxel/ doxorubicin/  cyclophosphamide in the neoadjuvant setting, discontinued after 4 cycles due to peripheral neuropathy.  Status post 2 cycles of every 3 week carboplatin/gemcitabine, for a total of 6 neoadjuvant cycles altogether, completed 11/09/2011.  (3) The patient refused surgery.  (4) With subsequent tumor re-growth, started Carboplatin/Gemcitabine on 09/10/2012, with treatment on days 1 and 8 of each 21 day cycle, received six cycles, last dose 01/28/2013  (5) Tooth extractions without complication on 12/13/2012 for dental issues.  (6) Urinary tract infection with possible candida infection    PLAN:  Sinus cancer or has grown some through the current treatment, which is discouraging. On the other hand we no longer see a lesion in her liver. We are going to have to demonstrate this with a liver MRI but her next op is surgery. She has been scheduled for a left modified radical mastectomy December 30. I suggested that possibly we could move the date up a bit if Dr. Corliss Skains had a cancellation, but she tells me she is going to [place] in 2 days, December 10, and will not be back until the 20th. She will be bringing her mother back with her. She states "the 30th is okay".  I have made her a return appointment with me in January. Hopefully she will have had her definitive surgery by then. We will re\re image her liver and consider adjuvant treatment, keeping in mind that she had significant problems with neuropathy on to docetaxel.  In the meantime I went ahead and wrote her a prescription for additional Percocet. She already has antinausea medicine and we reviewed how to take those. Lissa Hoard has a good understanding of this plan. She is in agreement with it. She knows to call for any problems that may develop before her next visit here.      Lowella Dell, MD   02/11/2013 11:53 AM

## 2013-02-14 ENCOUNTER — Telehealth (INDEPENDENT_AMBULATORY_CARE_PROVIDER_SITE_OTHER): Payer: Self-pay

## 2013-02-14 ENCOUNTER — Telehealth: Payer: Self-pay | Admitting: *Deleted

## 2013-02-14 MED ORDER — CEPHALEXIN 500 MG PO CAPS: 500.0000 mg | ORAL_CAPSULE | Freq: Two times a day (BID) | ORAL | Status: DC

## 2013-02-14 NOTE — Addendum Note (Signed)
Addended by: Billey Co on: 02/14/2013 05:23 PM   Modules accepted: Orders

## 2013-02-14 NOTE — Telephone Encounter (Signed)
Per MD review recommendation given to add Keflex with the Cipro.  If pt is not going to [place] - inquire with [facility] if surgery could be moved up ( sooner ).  This RN called Byrd Hesselbach and discussed the above- at present due to circumstances- pt is not going to [place].  This RN called to [facility] and left message with triage RN per above who will inform Dr Fatima Sanger RN.  This RN then spoke with pt about above, verified pharmacy and prescription sent .

## 2013-02-14 NOTE — Telephone Encounter (Signed)
This RN received message from [facility] nurse stating pt is reporting intermittant fevers of 101.  Per Marie's assessment temp today is 98.6.  Pt is on Cipro but reports she has missed a few doses.  This note will be given to MD for review.  Return call to Hilda Lias is [phone].

## 2013-02-14 NOTE — Telephone Encounter (Signed)
[name] from Dr. Darrall Dears office calling to request the possibility of moving up this pt's surgery.  Pt is scheduled for 03/05/13 with Dr. Corliss Skains.  Pt scheduled to be out of town from 02/13/13 through 02/23/13.  Please advise.

## 2013-02-15 NOTE — Telephone Encounter (Signed)
No, that's not possible.  I am off between 12/20 and 12/29.  She had the opportunity to schedule this earlier but chose this date.

## 2013-02-18 ENCOUNTER — Encounter (HOSPITAL_COMMUNITY): Payer: Self-pay | Admitting: Pharmacy Technician

## 2013-02-22 ENCOUNTER — Other Ambulatory Visit (HOSPITAL_COMMUNITY): Payer: Self-pay

## 2013-02-23 ENCOUNTER — Encounter (HOSPITAL_COMMUNITY): Payer: Self-pay | Admitting: Emergency Medicine

## 2013-02-23 ENCOUNTER — Emergency Department (HOSPITAL_COMMUNITY)
Admission: EM | Admit: 2013-02-23 | Discharge: 2013-02-23 | Disposition: A | Discharge status: Home / Self Care | Payer: Medicaid Other | Attending: Emergency Medicine | Admitting: Emergency Medicine

## 2013-02-23 DIAGNOSIS — N6459 Other signs and symptoms in breast: Secondary | ICD-10-CM | POA: Insufficient documentation

## 2013-02-23 DIAGNOSIS — Z8709 Personal history of other diseases of the respiratory system: Secondary | ICD-10-CM | POA: Insufficient documentation

## 2013-02-23 DIAGNOSIS — K7689 Other specified diseases of liver: Secondary | ICD-10-CM | POA: Insufficient documentation

## 2013-02-23 DIAGNOSIS — Z792 Long term (current) use of antibiotics: Secondary | ICD-10-CM | POA: Insufficient documentation

## 2013-02-23 DIAGNOSIS — C50919 Malignant neoplasm of unspecified site of unspecified female breast: Secondary | ICD-10-CM | POA: Insufficient documentation

## 2013-02-23 DIAGNOSIS — Z79899 Other long term (current) drug therapy: Secondary | ICD-10-CM | POA: Insufficient documentation

## 2013-02-23 DIAGNOSIS — Z8669 Personal history of other diseases of the nervous system and sense organs: Secondary | ICD-10-CM | POA: Insufficient documentation

## 2013-02-23 LAB — CBC WITH DIFFERENTIAL/PLATELET
Basophils Absolute: 0 10*3/uL (ref 0.0–0.1)
Basophils Relative: 0 % (ref 0–1)
Hemoglobin: 10.6 g/dL — ABNORMAL LOW (ref 12.0–15.0)
MCHC: 34 g/dL (ref 30.0–36.0)
Monocytes Relative: 14 % — ABNORMAL HIGH (ref 3–12)
Neutro Abs: 2.3 10*3/uL (ref 1.7–7.7)
Neutrophils Relative %: 58 % (ref 43–77)
Platelets: 166 10*3/uL (ref 150–400)
RBC: 3.53 MIL/uL — ABNORMAL LOW (ref 3.87–5.11)
RDW: 16.6 % — ABNORMAL HIGH (ref 11.5–15.5)

## 2013-02-23 LAB — COMPREHENSIVE METABOLIC PANEL
AST: 26 U/L (ref 0–37)
Albumin: 3.7 g/dL (ref 3.5–5.2)
Alkaline Phosphatase: 76 U/L (ref 39–117)
Chloride: 96 mEq/L (ref 96–112)
Creatinine, Ser: 0.74 mg/dL (ref 0.50–1.10)
GFR calc Af Amer: >90 mL/min (ref >90)
Glucose, Bld: 117 mg/dL — ABNORMAL HIGH (ref 70–99)
Potassium: 3.9 mEq/L (ref 3.5–5.1)
Sodium: 135 mEq/L (ref 135–145)
Total Bilirubin: 0.2 mg/dL — ABNORMAL LOW (ref 0.3–1.2)
Total Protein: 7.6 g/dL (ref 6.0–8.3)

## 2013-02-23 LAB — APTT: aPTT: 29 seconds (ref 24–37)

## 2013-02-23 LAB — PROTIME-INR: Prothrombin Time: 13.2 seconds (ref 11.6–15.2)

## 2013-02-23 NOTE — ED Provider Notes (Signed)
CSN: [id number]     Arrival date & time 02/23/13  0403 History   First MD Initiated Contact with Patient 02/23/13 0411     Chief Complaint  Patient presents with  . Breast Problem   (Consider location/radiation/quality/duration/timing/severity/associated sxs/prior Treatment) HPI Patient with a history of breast cancer with metastases facet cyst of the liver scheduled for a mastectomy the end of this month. She has a fungating left breast mass that occasionally bleeds and requires dressing. She states that her husband was changing the dressing tonight and it started to bleed. She is unsure of how much blood she lost but states it was more than in the past. She is having no lightheadedness or dizziness. She denies any chest pain or shortness of breath. The bleeding has stopped at this point. And she is resting comfortably Past Medical History  Diagnosis Date  . Breast abscess 03/2011  . History of bronchitis     last time OCt-Nov 2012  . Seizures     as a toddler ,but none since then.  . Joint pain   . History of blood transfusion     in May 2013-no abnormal reaction noted  . Cancer     Left Breast   Past Surgical History  Procedure Laterality Date  . Dilation and curettage of uterus  01/04/11  . Breast mass excision  2013  . Tonsillectomy    . Portacath placement  04/12/2011    Procedure: INSERTION PORT-A-CATH;  Surgeon: Wilmon Arms. Tsuei, MD;  Location: [facility];  Service: General;  Laterality: Right;  right subclavian  . Incision and drainage breast abscess  03/31/11   Family History  Problem Relation Age of Onset  . Diabetes Mother   . Hypertension Mother    History  Substance Use Topics  . Smoking status: Never Smoker   . Smokeless tobacco: Never Used  . Alcohol Use: Yes     Comment: occasionally wine   OB History   Grav Para Term Preterm Abortions TAB SAB Ect Mult Living   6    6 3 3   1      Review of Systems  Constitutional: Negative for fever and chills.   Respiratory: Negative for shortness of breath.   Cardiovascular: Negative for chest pain.  Gastrointestinal: Negative for nausea, vomiting and abdominal pain.  Skin: Positive for wound.  Neurological: Negative for dizziness, syncope, weakness, light-headedness and numbness.  All other systems reviewed and are negative.    Allergies  Sulfa antibiotics  Home Medications   Current Outpatient Rx  Name  Route  Sig  Dispense  Refill  . cholecalciferol (VITAMIN D) 1000 UNITS tablet   Oral   Take 2,000 Units by mouth every morning.         [name] oxyCODONE-acetaminophen (PERCOCET/ROXICET) 5-325 MG per tablet   Oral   Take 1 tablet by mouth every 8 (eight) hours as needed for severe pain.         . cephALEXin (KEFLEX) 500 MG capsule   Oral   Take 1 capsule (500 mg total) by mouth 2 (two) times daily.   20 capsule   1   . ondansetron (ZOFRAN) 8 MG tablet   Oral   Take 1 tablet (8 mg total) by mouth every 8 (eight) hours as needed for nausea.   30 tablet   1   . OVER THE COUNTER MEDICATION   Oral   Take 1 tablet by mouth 2 (two) times daily. OTC: Liver Care  BP 116/64  Pulse 88  Temp(Src) 98.2 F (36.8 C) (Oral)  Resp 18  SpO2 100% Physical Exam  Nursing note and vitals reviewed. Constitutional: She is oriented to person, place, and time. She appears well-developed and well-nourished. No distress.  HENT:  Head: Normocephalic and atraumatic.  Mouth/Throat: Oropharynx is clear and moist.  Eyes: EOM are normal. Pupils are equal, round, and reactive to light.  Neck: Normal range of motion. Neck supple.  Cardiovascular: Normal rate and regular rhythm.   Pulmonary/Chest: Effort normal and breath sounds normal. No respiratory distress. She has no wheezes. She has no rales.  Patient has a large fungating left breast mass. There is clot in place. There is no active bleeding at this point. I see no evidence for infection.  Abdominal: Soft. Bowel sounds are normal. She  exhibits no distension and no mass. There is no tenderness. There is no rebound and no guarding.  Musculoskeletal: Normal range of motion. She exhibits no edema and no tenderness.  Neurological: She is alert and oriented to person, place, and time.  Results are as without deficit. Sensation grossly intact.  Skin: Skin is warm and dry. No rash noted. No erythema.  Psychiatric: She has a normal mood and affect. Her behavior is normal.    ED Course  Procedures (including critical care time) Labs Review Labs Reviewed - No data to display Imaging Review No results found.  EKG Interpretation   None       MDM  Will check basic labs including the patient's hemoglobin level to assure significant blood loss has not occurred.  No further bleeding in the emergency department. Patient is advised to followup with her surgeon for scheduled mastectomy.  Loren Racer, MD 02/23/13 [phone]

## 2013-02-23 NOTE — ED Notes (Addendum)
Pt  diagnosed with breast cancer 2013,  Scheduled for masectomy this week, but breast began bleeding and wasn't controlled until he arrived and wrapped chest area.

## 2013-02-25 ENCOUNTER — Telehealth: Payer: Self-pay | Admitting: *Deleted

## 2013-02-25 ENCOUNTER — Other Ambulatory Visit: Payer: Self-pay | Admitting: Oncology

## 2013-02-25 NOTE — Telephone Encounter (Signed)
This RN spoke with home health nurse, Byrd Hesselbach, per her assessment of breast for wound care. Note pt had to go to the ER over the weekend due to uncontrolled bleeding.  Today wound is continuing to ooze blood " from all corners of the dressing ". RN was unable to fully remove dressing due to bleeding concern. Additional dressing applied for reinforcement.  Pt was advised if she bleed thru the dressing to proceed to the ER.  This RN informed Byrd Hesselbach above information would be relayed to the surgeon's office ( pt scheduled for mastectomy 03/05/2013 ).  This RN called to [facility] and spoke with Glenda. Informed her of pt's situation and possible need for more emergent care. Rivka Barbara will inform on call MD- Dr Ezzard Standing.  This RN informed MD ( Dr Thea Silversmith ) of above as well.

## 2013-02-26 ENCOUNTER — Inpatient Hospital Stay (HOSPITAL_COMMUNITY)
Admission: RE | Admit: 2013-02-26 | Discharge: 2013-02-26 | Disposition: A | Discharge status: Home / Self Care | Payer: Self-pay | Source: Ambulatory Visit

## 2013-02-26 ENCOUNTER — Other Ambulatory Visit: Payer: Self-pay

## 2013-02-26 ENCOUNTER — Inpatient Hospital Stay (HOSPITAL_COMMUNITY): Payer: Medicaid Other

## 2013-02-26 ENCOUNTER — Inpatient Hospital Stay (HOSPITAL_COMMUNITY)
Admission: AD | Admit: 2013-02-26 | Discharge: 2013-03-02 | DRG: 582 | Disposition: A | Discharge status: Home Health | Payer: Medicaid Other | Source: Ambulatory Visit | Attending: General Surgery | Admitting: General Surgery

## 2013-02-26 ENCOUNTER — Other Ambulatory Visit: Payer: Self-pay | Admitting: Oncology

## 2013-02-26 ENCOUNTER — Other Ambulatory Visit (HOSPITAL_COMMUNITY): Payer: Self-pay | Admitting: *Deleted

## 2013-02-26 ENCOUNTER — Encounter (HOSPITAL_COMMUNITY): Payer: Self-pay | Admitting: *Deleted

## 2013-02-26 DIAGNOSIS — C50919 Malignant neoplasm of unspecified site of unspecified female breast: Secondary | ICD-10-CM | POA: Diagnosis present

## 2013-02-26 DIAGNOSIS — D649 Anemia, unspecified: Secondary | ICD-10-CM

## 2013-02-26 DIAGNOSIS — D62 Acute posthemorrhagic anemia: Secondary | ICD-10-CM | POA: Diagnosis not present

## 2013-02-26 DIAGNOSIS — N6459 Other signs and symptoms in breast: Secondary | ICD-10-CM

## 2013-02-26 DIAGNOSIS — G893 Neoplasm related pain (acute) (chronic): Secondary | ICD-10-CM | POA: Diagnosis present

## 2013-02-26 DIAGNOSIS — C787 Secondary malignant neoplasm of liver and intrahepatic bile duct: Secondary | ICD-10-CM

## 2013-02-26 DIAGNOSIS — C50412 Malignant neoplasm of upper-outer quadrant of left female breast: Secondary | ICD-10-CM | POA: Diagnosis present

## 2013-02-26 DIAGNOSIS — C773 Secondary and unspecified malignant neoplasm of axilla and upper limb lymph nodes: Secondary | ICD-10-CM | POA: Diagnosis present

## 2013-02-26 DIAGNOSIS — Z79899 Other long term (current) drug therapy: Secondary | ICD-10-CM

## 2013-02-26 DIAGNOSIS — C50419 Malignant neoplasm of upper-outer quadrant of unspecified female breast: Principal | ICD-10-CM | POA: Diagnosis present

## 2013-02-26 DIAGNOSIS — T8130XA Disruption of wound, unspecified, initial encounter: Secondary | ICD-10-CM

## 2013-02-26 DIAGNOSIS — E876 Hypokalemia: Secondary | ICD-10-CM | POA: Diagnosis not present

## 2013-02-26 DIAGNOSIS — Z9221 Personal history of antineoplastic chemotherapy: Secondary | ICD-10-CM

## 2013-02-26 LAB — COMPREHENSIVE METABOLIC PANEL
ALT: 10 U/L (ref 0–35)
AST: 26 U/L (ref 0–37)
BUN: 7 mg/dL (ref 6–23)
CO2: 27 mEq/L (ref 19–32)
Calcium: 9.1 mg/dL (ref 8.4–10.5)
Chloride: 96 mEq/L (ref 96–112)
GFR calc Af Amer: >90 mL/min (ref >90)
GFR calc non Af Amer: 83 mL/min — ABNORMAL LOW (ref >90)
Glucose, Bld: 100 mg/dL — ABNORMAL HIGH (ref 70–99)
Sodium: 133 mEq/L — ABNORMAL LOW (ref 135–145)
Total Bilirubin: 0.2 mg/dL — ABNORMAL LOW (ref 0.3–1.2)
Total Protein: 7.7 g/dL (ref 6.0–8.3)

## 2013-02-26 LAB — CBC WITH DIFFERENTIAL/PLATELET
Basophils Absolute: 0 10*3/uL (ref 0.0–0.1)
Basophils Relative: 0 % (ref 0–1)
Eosinophils Absolute: 0 10*3/uL (ref 0.0–0.7)
Eosinophils Relative: 1 % (ref 0–5)
HCT: 30.7 % — ABNORMAL LOW (ref 36.0–46.0)
MCH: 29.3 pg (ref 26.0–34.0)
MCHC: 33.6 g/dL (ref 30.0–36.0)
Neutrophils Relative %: 64 % (ref 43–77)
Platelets: 216 10*3/uL (ref 150–400)
RDW: 16.4 % — ABNORMAL HIGH (ref 11.5–15.5)

## 2013-02-26 LAB — URINE MICROSCOPIC-ADD ON

## 2013-02-26 LAB — URINALYSIS, ROUTINE W REFLEX MICROSCOPIC
Bilirubin Urine: NEGATIVE
Hgb urine dipstick: NEGATIVE
Ketones, ur: NEGATIVE mg/dL
Protein, ur: NEGATIVE mg/dL
Specific Gravity, Urine: 1.029 (ref 1.005–1.030)
Urobilinogen, UA: 0.2 mg/dL (ref 0.0–1.0)
pH: 6 (ref 5.0–8.0)

## 2013-02-26 LAB — PROTIME-INR: Prothrombin Time: 13.1 seconds (ref 11.6–15.2)

## 2013-02-26 LAB — APTT: aPTT: 30 seconds (ref 24–37)

## 2013-02-26 MED ORDER — SODIUM CHLORIDE 0.9 % IV SOLN: INTRAVENOUS | Status: DC

## 2013-02-26 MED ORDER — CEPHALEXIN 500 MG PO CAPS
500.0000 mg | ORAL_CAPSULE | Freq: Two times a day (BID) | ORAL | Status: DC
  Administered 2013-02-26: 500 mg via ORAL
  Filled 2013-02-26 (×3): qty 1

## 2013-02-26 MED ORDER — VITAMIN D3 25 MCG (1000 UNIT) PO TABS
2000.0000 [IU] | ORAL_TABLET | Freq: Every morning | ORAL | Status: DC
  Administered 2013-02-28 – 2013-03-02 (×3): 2000 [IU] via ORAL
  Filled 2013-02-26 (×4): qty 2

## 2013-02-26 MED ORDER — CHLORHEXIDINE GLUCONATE 4 % EX LIQD
1.0000 "application " | Freq: Once | CUTANEOUS | Status: AC
  Administered 2013-02-27: 1 via TOPICAL
  Filled 2013-02-26 (×2): qty 15

## 2013-02-26 MED ORDER — LIDOCAINE-PRILOCAINE 2.5-2.5 % EX CREA
TOPICAL_CREAM | Freq: Once | CUTANEOUS | Status: DC
  Filled 2013-02-26: qty 5

## 2013-02-26 MED ORDER — OXYCODONE-ACETAMINOPHEN 5-325 MG PO TABS
1.0000 | ORAL_TABLET | ORAL | Status: DC | PRN
  Administered 2013-02-26 – 2013-03-01 (×6): 1 via ORAL
  Filled 2013-02-26 (×6): qty 1

## 2013-02-26 MED ORDER — POLYETHYLENE GLYCOL 3350 17 G PO PACK
17.0000 g | PACK | Freq: Every day | ORAL | Status: DC | PRN
  Filled 2013-02-26: qty 1

## 2013-02-26 MED ORDER — OXYCODONE-ACETAMINOPHEN 5-325 MG PO TABS: 1.0000 | ORAL_TABLET | Freq: Three times a day (TID) | ORAL | Status: DC | PRN

## 2013-02-26 MED ORDER — ONDANSETRON HCL 8 MG PO TABS: 8.0000 mg | ORAL_TABLET | Freq: Three times a day (TID) | ORAL | Status: DC | PRN

## 2013-02-26 MED ORDER — CEFAZOLIN SODIUM-DEXTROSE 2-3 GM-% IV SOLR
2.0000 g | INTRAVENOUS | Status: AC
  Administered 2013-02-27: 2 g via INTRAVENOUS
  Filled 2013-02-26: qty 50

## 2013-02-26 MED ORDER — CHLORHEXIDINE GLUCONATE 4 % EX LIQD
1.0000 "application " | Freq: Once | CUTANEOUS | Status: AC
  Administered 2013-02-26: 1 via TOPICAL
  Filled 2013-02-26: qty 15

## 2013-02-26 NOTE — Pre-Procedure Instructions (Signed)
JANELIE GOLTZ  02/26/2013   Your procedure is scheduled on:  Tuesday, March 05, 2013 at 8:15 AM.   Report to [facility] Entrance "A" Admitting Office at 6:15 AM.   Call this number if you have problems the morning of surgery: [phone]   Remember:   Do not eat food or drink liquids after midnight Monday, 03/04/13.   Take these medicines the morning of surgery with A SIP OF WATER: oxyCODONE-acetaminophen (PERCOCET/ROXICET) - if needed,  ondansetron [facility]) - if needed.  Stop otc med Liver Care as of today, 02/26/13.   Do not wear jewelry, make-up or nail polish.  Do not wear lotions, powders, or perfumes. You may wear deodorant.  Do not shave 48 hours prior to surgery.   Do not bring valuables to the hospital.  [facility] is not responsible                  for any belongings or valuables.               Contacts, dentures or bridgework may not be worn into surgery.  Leave suitcase in the car. After surgery it may be brought to your room.  For patients admitted to the hospital, discharge time is determined by your                treatment team.             Special Instructions: Shower using CHG 2 nights before surgery and the night before surgery.  If you shower the day of surgery use CHG.  Use special wash - you have one bottle of CHG for all showers.  You should use approximately 1/3 of the bottle for each shower.   Please read over the following fact sheets that you were given: Pain Booklet, Coughing and Deep Breathing and Surgical Site Infection Prevention

## 2013-02-26 NOTE — H&P (Signed)
.    IDMelina May   DOB: 02/05/1966  MR#: [id number]  CSN#[id number]  WUJ:[id number] Not In System SU: Alyssa Rudd, MD RAD ONC: Alyssa Peak, MD  CC: "I'm bleeding."   HISTORY OF PRESENT ILLNESS: Alyssa May is a 47 y.o. [place] woman who first noted a small mass in her left breast 01/2010, shortly after she stopped breast-feeding.  It did not disappear with resumption of her periods and grew over the next year, and particularly since she had her miscarrriage in 12/2010. This brought her to the Emergency Room 03/09/2011 where she was found to have a fluctuant area in the UOQ of the left breast measuring 8.4 cm. The tissue beneath the cystic area extending towards the axilla and medially was described as firm, but not erythematous. The cystic area was lanced and a large amount of serosanguinos material was expressed. The patient was referred to Dr. Marcille May who saw her 03/11/2011. By this time the fluid had largely reaccumulated. He incised the lesion, again draining serosanguinous liquid, packed it, and set the patient up [facility]14[facility]. This showed an irregularly marginated mass measuring 7.3 cm, with abnormal axillary adenopathy. On 01/1102013 bilateral mammography confirmed a high-density mass in the left UOQ. This was biopsied, as was a left axillary lymph node. Both biopsies ([id number]) showed invasive ductal carcinoma, high grade, triple negative, with an Mib-1 of 97%.  Her subsequent history is as detailed below.   INTERVAL HISTORY: Alyssa May completed six cycles of chemotherapy (most recent dose of gemcitabine 01/28/2013) and postponed surgery because of a variety of family issues. Her definitive surgery was scheduled for 03/05/2013 under Dr Alyssa May. However over the past 2 weeks she has had increased bloody oozing from her left breast fungating lesion. Dr. Corliss May was unable to move up her surgical date and is currently out of town. The patient has  presented repeatedly to the office, ED, and received intensive support from Alyssa May, but it has become more and more difficult to control her bleeding and I am admitting the patient today because it had proved impossible to control her bleeding as an outpatient and to try to move up the surgery if at all possible   REVIEW OF SYSTEMS: Alyssa May's pain is moderately well controlled--she is very stoical and has been trying to avoid narcotics because of constipation issues. She denies unusual headaches, N/V or visual changes. There has been no N/V/ She is treating the constipation with herbal remedies. The bleeding is being managed with gels and pressure dressings. Except as ntoed above a detaield review of systems today is stable  PAST MEDICAL HISTORY: Past Medical History   Diagnosis  Date   .  Breast abscess  03/2011   .  History of bronchitis         last time OCt-Nov 2012   .  Seizures         as a toddler ,but none since then.   .  Joint pain     .  History of blood transfusion         in May 2013-no abnormal reaction noted   .  Cancer         Left Breast     PAST SURGICAL HISTORY: Past Surgical History   Procedure  Laterality  Date   .  Dilation and curettage of uterus    01/04/11   .  Breast mass excision    2013   .  Tonsillectomy       .  Portacath placement    04/12/2011       Procedure: INSERTION PORT-A-CATH;  Surgeon: Alyssa Arms. Tsuei, MD;  Location: [facility];  Service: General;  Laterality: Right;  right subclavian   .  Incision and drainage breast abscess    03/31/11    FAMILY HISTORY Family History   Problem  Relation  Age of Onset   .  Diabetes  Mother     .  Hypertension  Mother      [name] died from post-operative complications at age 35; the patient's mother is 77, lives in [place] The patient is an only child.   GYNECOLOGIC HISTORY: First pregnancy to term age 54. Her menstrual cycles were interrupted with chemotherapy, but resumed in 05/2012.  She has  not had a menses since her menstrual cycle in 05/2012.  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 She has a good deal of family in this area. Her husband Alyssa May is a Hospital doctor for a [employer] in [place]. He works there 15 days, and then returns home for another 15 days. Currently he is considering a similar job in [place], [name]. Their daughter, Alyssa May, is being home schooled.                    ADVANCED DIRECTIVES: Not in place     HEALTH MAINTENANCE: History   Substance Use Topics   .  Smoking status:  Never Smoker    .  Smokeless tobacco:  Never Used   .  Alcohol Use:  Yes         Comment: occasionally wine                    Colonoscopy:  N/A             PAP: 03/18/2011             Bone density: N/A             Lipid panel: Not on file      Allergies   Allergen  Reactions   .  Sulfa Antibiotics  Swelling         Current Outpatient Prescriptions   Medication  Sig  Dispense  Refill   .  amoxicillin (AMOXIL) 500 MG capsule  Take 4 capsules (2,000 mg total) by mouth once.   4 capsule   1   .  B Complex CAPS  Take 1 capsule by mouth daily.         .  Cholecalciferol (VITAMIN D3) 2000 UNITS TABS  Take 1 tablet by mouth daily.         .  ciprofloxacin (CIPRO) 500 MG tablet  Take 1 tablet (500 mg total) by mouth 2 (two) times daily.   14 tablet   0   .  fluconazole (DIFLUCAN) 100 MG tablet  Take 1 tablet (100 mg total) by mouth daily.   3 tablet   0   .  GLUCOSAMINE CHONDROITIN COMPLX PO  Take 1 each by mouth daily. Liquid glucosamine chondroitin         .  lidocaine-prilocaine (EMLA) cream  Apply 1 application topically as needed. To port   30 g   1   .  LORazepam (ATIVAN) 0.5 MG tablet  Take 1 tablet (0.5 mg total) by mouth 2 (two) times daily as needed for anxiety.   20 tablet   0   .  metoCLOPramide (REGLAN) 10 MG tablet  Take 1 tablet (10 mg total) by mouth 4 (four) times daily -  before meals  and at bedtime.   120 tablet   6   .  omeprazole (PRILOSEC) 20 MG capsule  Take 1 capsule (20 mg total) by mouth 2 (two) times daily.   60 capsule   2   .  ondansetron (ZOFRAN) 8 MG tablet  Take 1 tablet (8 mg total) by mouth every 8 (eight) hours as needed for nausea.   30 tablet   1   .  oxyCODONE-acetaminophen (PERCOCET/ROXICET) 5-325 MG per tablet  Take 1 tablet by mouth every 8 (eight) hours as needed for pain.   90 tablet   0   .  promethazine (PHENERGAN) 25 MG suppository  Place 1 suppository (25 mg total) rectally every 6 (six) hours as needed for nausea.   6 each   6   .  sodium hypochlorite (DAKIN'S 1/2 STRENGTH) external solution  Apply irrigate twice daily or as directed   473 mL   3    Scheduled Meds: . cephALEXin  500 mg Oral BID  . [START ON 02/27/2013] cholecalciferol  2,000 Units Oral q morning - 10a  . lidocaine-prilocaine   Topical Once   Continuous Infusions: . sodium chloride     PRN Meds:.ondansetron, oxyCODONE-acetaminophen, polyethylene glycol     Objective: Middle-aged [place] American woman examined in bed  Filed Vitals:   02/26/13 1604  BP: 104/39  Pulse: 86  Temp: 97.8 F (36.6 C)  Resp: 18    Sclerae unicteric, pupils equal and reactive Oropharynx clear and moist-- no thrush No cervical or supraclavicular adenopathy Lungs no rales or rhonchi Heart regular rate and rhythm Abd soft, nontender, positive bowel sounds MSK no focal spinal tenderness, no upper extremity lymphedema Neuro: nonfocal, well oriented, appropriate affect Breasts: The right breast is unremarkable. The left breast is bandaged over with a pressure dressing. It was not uncovered . The left axilla shows palpable adenopathy, which is movable.   LAB RESULTS: Basic Metabolic Panel:  Recent Labs Lab 02/23/13 0517 02/26/13 1701  NA 135 133*  K 3.9 3.5  CL 96 96  CO2 28 27  GLUCOSE 117* 100*  BUN 7 7  CREATININE 0.74 0.84  CALCIUM 9.3 9.1   GFR Estimated Creatinine  Clearance: 84.6 ml/min (by C-G formula based on Cr of 0.84). Liver Function Tests:  Recent Labs Lab 02/23/13 0517 02/26/13 1701  AST 26 26  ALT 10 10  ALKPHOS 76 68  BILITOT 0.2* 0.2*  PROT 7.6 7.7  ALBUMIN 3.7 3.7   No results found for this basename: LIPASE, AMYLASE,  in the last 168 hours No results found for this basename: AMMONIA,  in the last 168 hours Coagulation profile  Recent Labs Lab 02/23/13 0517 02/26/13 1701  INR 1.02 1.01    CBC:  Recent Labs Lab 02/23/13 0517  WBC 4.0  NEUTROABS 2.3  HGB 10.6*  HCT 31.2*  MCV 88.4  PLT 166   Cardiac Enzymes: No results found for this basename: CKTOTAL, CKMB, CKMBINDEX, TROPONINI,  in the last 168 hours BNP: No components found with this basename: POCBNP,  CBG: No results found for this basename: GLUCAP,  in the last 168 hours D-Dimer No results found for this basename: DDIMER,  in the last 72 hours Hgb A1c No results found for this basename: HGBA1C,  in the last 72 hours Lipid Profile No results found for this basename: CHOL, HDL,  LDLCALC, TRIG, CHOLHDL, LDLDIRECT,  in the last 72 hours Thyroid function studies No results found for this basename: TSH, T4TOTAL, FREET3, T3FREE, THYROIDAB,  in the last 72 hours Anemia work up No results found for this basename: VITAMINB12, FOLATE, FERRITIN, TIBC, IRON, RETICCTPCT,  in the last 72 hours Microbiology No results found for this or any previous visit (from the past 240 hour(s)).   STUDIES: Ct Chest W Contrast   02/05/2013   CLINICAL DATA:  Breast cancer followup  EXAM: CT CHEST, ABDOMEN, AND PELVIS WITH CONTRAST  TECHNIQUE: Multidetector CT imaging of the chest, abdomen and pelvis was performed following the standard protocol during bolus administration of intravenous contrast.  CONTRAST:  OMNIPAQUE IOHEXOL 300 MG/ML  SOLN  COMPARISON:  None.  FINDINGS: CT CHEST FINDINGS  There is no pleural effusion identified. No airspace consolidation or atelectasis  identified. Mild cardiac enlargement. No pericardial effusion. No suspicious mediastinal or hilar adenopathy identified.  Asymmetric left-sided skin thickening overlies the left breast. Enhancing in heterogeneous mass within the periphery of the left breast measures 6.8 x 4.6 cm, image 29/series 2. On the previous exam this measured 4.8 x 3.2 cm. Pedunculated and ulcerative component to this mass measures 3.3 cm, image 22/series 2. Enlarged left axillary lymph nodes are again noted. Index node measures 2.3 cm, image 17/series 2. This is compared with 1.2 cm previously.  No supraclavicular or right axillary adenopathy. No internal mammary adenopathy identified. Review of the visualized osseous structures shows no aggressive lytic or sclerotic bone lesions.  CT ABDOMEN AND PELVIS FINDINGS  There is no focal liver abnormality. The gallbladder appears within normal limits. No biliary dilatation. Normal appearance of the pancreas. The spleen is normal.  The adrenal glands are both normal. The left kidney is normal. The right kidney is also normal. The urinary bladder is unremarkable. The uterus and adnexal structures are negative.  Normal caliber of the abdominal aorta. No aneurysm. There is no upper abdominal adenopathy noted. No pelvic or inguinal adenopathy noted.  No free fluid or abnormal fluid collections identified.  The stomach appears normal. The small bowel loops have a normal course and caliber and there is no obstruction.  Normal appearance of the colon. Review of the visualized bony structures shows changes of osteitis pubis, image 104/series 2. No aggressive lytic or sclerotic bone lesions identified.  IMPRESSION: CT chest:  1. Increase in size of large, partially ulcerative mass involving the lateral aspect of the left breast. 2. Increase in left axillary adenopathy. CT abdomen and pelvis:  1. No specific features identified to suggest metastatic disease.   Electronically Signed   By: Signa Kell M.D.    On: 02/05/2013 16:26    Ct Abdomen Pelvis W Contrast   02/05/2013   CLINICAL DATA:  Breast cancer followup  EXAM: CT CHEST, ABDOMEN, AND PELVIS WITH CONTRAST  TECHNIQUE: Multidetector CT imaging of the chest, abdomen and pelvis was performed following the standard protocol during bolus administration of intravenous contrast.  CONTRAST:  OMNIPAQUE IOHEXOL 300 MG/ML  SOLN  COMPARISON:  None.  FINDINGS: CT CHEST FINDINGS  There is no pleural effusion identified. No airspace consolidation or atelectasis identified. Mild cardiac enlargement. No pericardial effusion. No suspicious mediastinal or hilar adenopathy identified.  Asymmetric left-sided skin thickening overlies the left breast. Enhancing in heterogeneous mass within the periphery of the left breast measures 6.8 x 4.6 cm, image 29/series 2. On the previous exam this measured 4.8 x 3.2 cm. Pedunculated and ulcerative component to  this mass measures 3.3 cm, image 22/series 2. Enlarged left axillary lymph nodes are again noted. Index node measures 2.3 cm, image 17/series 2. This is compared with 1.2 cm previously.  No supraclavicular or right axillary adenopathy. No internal mammary adenopathy identified. Review of the visualized osseous structures shows no aggressive lytic or sclerotic bone lesions.  CT ABDOMEN AND PELVIS FINDINGS  There is no focal liver abnormality. The gallbladder appears within normal limits. No biliary dilatation. Normal appearance of the pancreas. The spleen is normal.  The adrenal glands are both normal. The left kidney is normal. The right kidney is also normal. The urinary bladder is unremarkable. The uterus and adnexal structures are negative.  Normal caliber of the abdominal aorta. No aneurysm. There is no upper abdominal adenopathy noted. No pelvic or inguinal adenopathy noted.  No free fluid or abnormal fluid collections identified.  The stomach appears normal. The small bowel loops have a normal course and caliber and there  is no obstruction.  Normal appearance of the colon. Review of the visualized bony structures shows changes of osteitis pubis, image 104/series 2. No aggressive lytic or sclerotic bone lesions identified.  IMPRESSION: CT chest:  1. Increase in size of large, partially ulcerative mass involving the lateral aspect of the left breast. 2. Increase in left axillary adenopathy. CT abdomen and pelvis:  1. No specific features identified to suggest metastatic disease.   Electronically Signed   By: Signa Kell M.D.   On: 02/05/2013 16:26       ASSESSMENT: Alyssa May is a 47 y.o.  [place], [name] woman with stage IV breast cancer (liver involvement at presentation but no bone, lung or brain spread):   (1)  Status post upper outer quadrant left breast biopsy 03/18/2011 showing a clinical T4,N1 invasive ductal carcinoma, high grade, triple negative, with an Mib-1 of 97%, with a single liver metastasis pathologically confirmed 03/31/2011.   (2) Status post 4 cycles of Q three-week docetaxel/ doxorubicin/ cyclophosphamide in the neoadjuvant setting, discontinued after 4 cycles due to peripheral neuropathy.  Status post 2 cycles of every 3 week carboplatin/gemcitabine, for a total of 6 neoadjuvant cycles altogether, completed 11/09/2011.   (3) The patient refused surgery.   (4) With subsequent tumor re-growth, started Carboplatin/Gemcitabine on 09/10/2012, with treatment on days 1 and 8 of each 21 day cycle, received six cycles, last dose 01/28/2013   (5) Tooth extractions without complication on 12/13/2012 for dental issues.    PLAN:   The chemotherapy initially controlled the cancer well but the cancer has progressed in the breast. There appears to have been a good response in the liver, which is the area of most concern. Naiah's fungating and ulcerated left breast mass is continuing to bleed and I will consult surgery with a view to possibly proceeding ASAP to Left modified radical  mastectomy as planned for later this month.  I will be out of town after tomorrow. I will ask surgery if they would be willing to take Alyssa May on their service post op. I will schedule the patient for follow up in our office 2 weeks or so post op. Alyssa May has a good understanding of the overall plan and agrees with it.     Lowella Dell, MD   02/26/2013 6:19 PM

## 2013-02-26 NOTE — Progress Notes (Signed)
Breast cancer of upper-outer quadrant of left female breast  Assessment: Stage 4 left breast cancer, not responding to current chemo with open tumor and difficulty controlling bleeding from the tumor  Plan: Left MRM I discussed procedure, risks and complications, need for drain, expected post op course, potential for infection or bleeding   Subjective: Patient with enlarging fungating, bleeding breast cancer, originally scheduled for [facility] by Dr Corliss Skains on 12/30, admitted today by Dr Darnelle Catalan due to ongoing bleeding from tumor and difficulty controlling it at home. Was in ED a few days ago due to bleeding, and Dr Darnelle Catalan discussed with patient and felt she should be admitted today and proceed to surgery tomorrow. Patient wished to proceed with that plan.   Objective: Vital signs in last 24 hours: Temp:  [97.8 F (36.6 C)] 97.8 F (36.6 C) (12/23 1604) Pulse Rate:  [86] 86 (12/23 1604) Resp:  [18] 18 (12/23 1604) BP: (104)/(39) 104/39 mmHg (12/23 1604) SpO2:  [100 %] 100 % (12/23 1604) Weight:  [161 lb (73.029 kg)] 161 lb (73.029 kg) (12/23 1604)    Intake/Output from previous day:    General appearance: alert, cooperative and no distress Resp: clear to auscultation bilaterally Breasts: normal appearance, no masses or tenderness, Large breasts bilaterally, right wnl, left has large dressing and not removed today due to risk of more bleeding. It was changed this morning, and bled for a few hours after. Dr Magrinat'snote of a few days ago reviewed for PE.  Lab Results:  No results found for this or any previous visit (from the past 24 hour(s)).   Studies/Results Radiology     MEDS, Scheduled . cephALEXin  500 mg Oral BID  . [START ON 02/27/2013] cholecalciferol  2,000 Units Oral q morning - 10a  . lidocaine-prilocaine   Topical Once       LOS: 0 days    Currie Paris, MD, [facility], [place] [phone]   02/26/2013 4:40 PM

## 2013-02-27 ENCOUNTER — Encounter (HOSPITAL_COMMUNITY): Payer: Medicaid Other | Admitting: Registered Nurse

## 2013-02-27 ENCOUNTER — Other Ambulatory Visit: Payer: Self-pay | Admitting: Oncology

## 2013-02-27 ENCOUNTER — Inpatient Hospital Stay (HOSPITAL_COMMUNITY): Payer: Medicaid Other | Admitting: Registered Nurse

## 2013-02-27 ENCOUNTER — Encounter (HOSPITAL_COMMUNITY): Payer: Self-pay | Admitting: Registered Nurse

## 2013-02-27 ENCOUNTER — Telehealth: Payer: Self-pay | Admitting: Oncology

## 2013-02-27 ENCOUNTER — Encounter (HOSPITAL_COMMUNITY)
Admission: AD | Disposition: A | Discharge status: Home Health | Payer: Self-pay | Source: Ambulatory Visit | Attending: Oncology

## 2013-02-27 DIAGNOSIS — C50912 Malignant neoplasm of unspecified site of left female breast: Secondary | ICD-10-CM

## 2013-02-27 DIAGNOSIS — C50419 Malignant neoplasm of upper-outer quadrant of unspecified female breast: Secondary | ICD-10-CM

## 2013-02-27 HISTORY — PX: MASTECTOMY MODIFIED RADICAL: SHX5962

## 2013-02-27 LAB — SURGICAL PCR SCREEN
MRSA, PCR: NEGATIVE
Staphylococcus aureus: NEGATIVE

## 2013-02-27 SURGERY — MASTECTOMY, MODIFIED RADICAL
Anesthesia: General | Laterality: Left

## 2013-02-27 MED ORDER — PROPOFOL 10 MG/ML IV BOLUS
INTRAVENOUS | Status: AC
  Filled 2013-02-27: qty 20

## 2013-02-27 MED ORDER — LACTATED RINGERS IV SOLN
INTRAVENOUS | Status: DC | PRN
  Administered 2013-02-27 (×2): via INTRAVENOUS

## 2013-02-27 MED ORDER — PHENYLEPHRINE HCL 10 MG/ML IJ SOLN
INTRAMUSCULAR | Status: DC | PRN
  Administered 2013-02-27 (×2): 40 ug via INTRAVENOUS
  Administered 2013-02-27: 80 ug via INTRAVENOUS

## 2013-02-27 MED ORDER — OXYCODONE HCL 5 MG PO TABS: 5.0000 mg | ORAL_TABLET | Freq: Once | ORAL | Status: DC | PRN

## 2013-02-27 MED ORDER — PHENYLEPHRINE HCL 10 MG/ML IJ SOLN
INTRAMUSCULAR | Status: AC
  Filled 2013-02-27: qty 1

## 2013-02-27 MED ORDER — HYDROMORPHONE HCL PF 1 MG/ML IJ SOLN
INTRAMUSCULAR | Status: DC | PRN
  Administered 2013-02-27 (×2): 0.5 mg via INTRAVENOUS

## 2013-02-27 MED ORDER — ONDANSETRON HCL 4 MG/2ML IJ SOLN
INTRAMUSCULAR | Status: DC | PRN
  Administered 2013-02-27: 4 mg via INTRAVENOUS

## 2013-02-27 MED ORDER — PHENYLEPHRINE 40 MCG/ML (10ML) SYRINGE FOR IV PUSH (FOR BLOOD PRESSURE SUPPORT)
PREFILLED_SYRINGE | INTRAVENOUS | Status: AC
  Filled 2013-02-27: qty 10

## 2013-02-27 MED ORDER — EPHEDRINE SULFATE 50 MG/ML IJ SOLN
INTRAMUSCULAR | Status: DC | PRN
  Administered 2013-02-27: 5 mg via INTRAVENOUS

## 2013-02-27 MED ORDER — KCL IN DEXTROSE-NACL 20-5-0.45 MEQ/L-%-% IV SOLN
INTRAVENOUS | Status: DC
  Administered 2013-02-27 – 2013-03-02 (×5): via INTRAVENOUS
  Filled 2013-02-27 (×11): qty 1000

## 2013-02-27 MED ORDER — CEFAZOLIN SODIUM-DEXTROSE 2-3 GM-% IV SOLR
INTRAVENOUS | Status: DC | PRN
  Administered 2013-02-27: 2 g via INTRAVENOUS

## 2013-02-27 MED ORDER — FENTANYL CITRATE 0.05 MG/ML IJ SOLN
INTRAMUSCULAR | Status: DC | PRN
  Administered 2013-02-27 (×5): 50 ug via INTRAVENOUS

## 2013-02-27 MED ORDER — GLYCOPYRROLATE 0.2 MG/ML IJ SOLN
INTRAMUSCULAR | Status: DC | PRN
  Administered 2013-02-27: .6 mg via INTRAVENOUS

## 2013-02-27 MED ORDER — CEPHALEXIN 500 MG PO CAPS
500.0000 mg | ORAL_CAPSULE | Freq: Four times a day (QID) | ORAL | Status: DC
  Administered 2013-02-28 – 2013-03-02 (×9): 500 mg via ORAL
  Filled 2013-02-27 (×12): qty 1

## 2013-02-27 MED ORDER — 0.9 % SODIUM CHLORIDE (POUR BTL) OPTIME
TOPICAL | Status: DC | PRN
  Administered 2013-02-27: 1000 mL

## 2013-02-27 MED ORDER — MIDAZOLAM HCL 5 MG/5ML IJ SOLN
INTRAMUSCULAR | Status: DC | PRN
  Administered 2013-02-27: 2 mg via INTRAVENOUS

## 2013-02-27 MED ORDER — PHENYLEPHRINE HCL 10 MG/ML IJ SOLN
10.0000 mg | INTRAVENOUS | Status: DC | PRN
  Administered 2013-02-27: 40 ug/min via INTRAVENOUS

## 2013-02-27 MED ORDER — NEOSTIGMINE METHYLSULFATE 1 MG/ML IJ SOLN
INTRAMUSCULAR | Status: AC
  Filled 2013-02-27: qty 10

## 2013-02-27 MED ORDER — DEXAMETHASONE SODIUM PHOSPHATE 10 MG/ML IJ SOLN
INTRAMUSCULAR | Status: AC
  Filled 2013-02-27: qty 1

## 2013-02-27 MED ORDER — HYDROMORPHONE HCL PF 1 MG/ML IJ SOLN
0.2500 mg | INTRAMUSCULAR | Status: DC | PRN
Start: 2013-02-27 — End: 2013-02-27
  Administered 2013-02-27: 0.125 mg via INTRAVENOUS
  Administered 2013-02-27: 0.25 mg via INTRAVENOUS

## 2013-02-27 MED ORDER — MEPERIDINE HCL 50 MG/ML IJ SOLN: 6.2500 mg | INTRAMUSCULAR | Status: DC | PRN

## 2013-02-27 MED ORDER — PROMETHAZINE HCL 25 MG/ML IJ SOLN: 6.2500 mg | INTRAMUSCULAR | Status: DC | PRN

## 2013-02-27 MED ORDER — CEPHALEXIN 500 MG PO CAPS
500.0000 mg | ORAL_CAPSULE | Freq: Four times a day (QID) | ORAL | Status: AC
  Administered 2013-02-27 (×2): 500 mg via ORAL
  Filled 2013-02-27 (×2): qty 1

## 2013-02-27 MED ORDER — LIDOCAINE HCL (CARDIAC) 20 MG/ML IV SOLN
INTRAVENOUS | Status: DC | PRN
  Administered 2013-02-27: 50 mg via INTRAVENOUS

## 2013-02-27 MED ORDER — GLYCOPYRROLATE 0.2 MG/ML IJ SOLN
INTRAMUSCULAR | Status: AC
  Filled 2013-02-27: qty 3

## 2013-02-27 MED ORDER — FENTANYL CITRATE 0.05 MG/ML IJ SOLN
INTRAMUSCULAR | Status: AC
  Filled 2013-02-27: qty 5

## 2013-02-27 MED ORDER — HYDROMORPHONE HCL PF 2 MG/ML IJ SOLN
INTRAMUSCULAR | Status: AC
  Filled 2013-02-27: qty 1

## 2013-02-27 MED ORDER — ROCURONIUM BROMIDE 100 MG/10ML IV SOLN
INTRAVENOUS | Status: DC | PRN
  Administered 2013-02-27: 20 mg via INTRAVENOUS

## 2013-02-27 MED ORDER — BUPIVACAINE HCL (PF) 0.25 % IJ SOLN
INTRAMUSCULAR | Status: DC | PRN
  Administered 2013-02-27: 20 mL

## 2013-02-27 MED ORDER — BUPIVACAINE HCL (PF) 0.25 % IJ SOLN
INTRAMUSCULAR | Status: AC
  Filled 2013-02-27: qty 30

## 2013-02-27 MED ORDER — NEOSTIGMINE METHYLSULFATE 1 MG/ML IJ SOLN
INTRAMUSCULAR | Status: DC | PRN
  Administered 2013-02-27: 4 mg via INTRAVENOUS

## 2013-02-27 MED ORDER — OXYCODONE HCL 5 MG/5ML PO SOLN
5.0000 mg | Freq: Once | ORAL | Status: DC | PRN
  Filled 2013-02-27: qty 5

## 2013-02-27 MED ORDER — LIDOCAINE HCL (CARDIAC) 20 MG/ML IV SOLN
INTRAVENOUS | Status: AC
  Filled 2013-02-27: qty 5

## 2013-02-27 MED ORDER — MIDAZOLAM HCL 2 MG/2ML IJ SOLN
INTRAMUSCULAR | Status: AC
  Filled 2013-02-27: qty 2

## 2013-02-27 MED ORDER — LACTATED RINGERS IV SOLN
INTRAVENOUS | Status: DC | PRN
  Administered 2013-02-27 (×2): via INTRAVENOUS

## 2013-02-27 MED ORDER — ONDANSETRON HCL 4 MG/2ML IJ SOLN
INTRAMUSCULAR | Status: AC
  Filled 2013-02-27: qty 2

## 2013-02-27 MED ORDER — PROPOFOL 10 MG/ML IV BOLUS
INTRAVENOUS | Status: DC | PRN
  Administered 2013-02-27: 30 mg via INTRAVENOUS
  Administered 2013-02-27: 120 mg via INTRAVENOUS

## 2013-02-27 MED ORDER — HYDROMORPHONE HCL PF 1 MG/ML IJ SOLN
INTRAMUSCULAR | Status: AC
  Filled 2013-02-27: qty 1

## 2013-02-27 MED ORDER — DEXAMETHASONE SODIUM PHOSPHATE 4 MG/ML IJ SOLN
INTRAMUSCULAR | Status: DC | PRN
  Administered 2013-02-27: 6 mg via INTRAVENOUS

## 2013-02-27 MED ORDER — SUCCINYLCHOLINE CHLORIDE 20 MG/ML IJ SOLN
INTRAMUSCULAR | Status: DC | PRN
  Administered 2013-02-27: 60 mg via INTRAVENOUS

## 2013-02-27 MED ORDER — MORPHINE SULFATE 2 MG/ML IJ SOLN: 2.0000 mg | INTRAMUSCULAR | Status: DC | PRN

## 2013-02-27 SURGICAL SUPPLY — 53 items
APPLIER CLIP 11 MED OPEN (CLIP)
APPLIER CLIP 9.375 MED OPEN (MISCELLANEOUS) ×4
BANDAGE GAUZE ELAST BULKY 4 IN (GAUZE/BANDAGES/DRESSINGS) ×2 IMPLANT
BENZOIN TINCTURE PRP APPL 2/3 (GAUZE/BANDAGES/DRESSINGS) IMPLANT
BINDER BREAST LRG (GAUZE/BANDAGES/DRESSINGS) ×2 IMPLANT
BIOPATCH WHT 1IN DISK W/4.0 H (GAUZE/BANDAGES/DRESSINGS) ×2 IMPLANT
BLADE HEX COATED 2.75 (ELECTRODE) ×2 IMPLANT
BNDG COHESIVE 1X5 TAN STRL LF (GAUZE/BANDAGES/DRESSINGS) IMPLANT
CANISTER SUCTION 2500CC (MISCELLANEOUS) ×2 IMPLANT
CLIP APPLIE 11 MED OPEN (CLIP) IMPLANT
CLIP APPLIE 9.375 MED OPEN (MISCELLANEOUS) ×2 IMPLANT
DISSECTOR ROUND CHERRY 3/8 STR (MISCELLANEOUS) ×2 IMPLANT
DRAIN CHANNEL 10F 3/8 F FF (DRAIN) IMPLANT
DRAIN CHANNEL RND F F (WOUND CARE) ×4 IMPLANT
DRAPE INCISE IOBAN 66X45 STRL (DRAPES) ×2 IMPLANT
DRAPE LAPAROSCOPIC ABDOMINAL (DRAPES) IMPLANT
DRAPE LG THREE QUARTER DISP (DRAPES) IMPLANT
DRAPE ORTHO 2.5IN SPLIT 77X108 (DRAPES) IMPLANT
DRAPE ORTHO SPLIT 77X108 STRL (DRAPES)
DRAPE UTILITY XL STRL (DRAPES) ×2 IMPLANT
DRSG EMULSION OIL 3X16 NADH (GAUZE/BANDAGES/DRESSINGS) ×2 IMPLANT
ELECT BLADE TIP CTD 4 INCH (ELECTRODE) ×2 IMPLANT
ELECT REM PT RETURN 9FT ADLT (ELECTROSURGICAL) ×2
ELECTRODE REM PT RTRN 9FT ADLT (ELECTROSURGICAL) ×1 IMPLANT
EVACUATOR DRAINAGE 10X20 100CC (DRAIN) ×2 IMPLANT
EVACUATOR SILICONE 100CC (DRAIN) ×2 IMPLANT
GLOVE BIOGEL PI IND STRL 7.0 (GLOVE) ×3 IMPLANT
GLOVE BIOGEL PI INDICATOR 7.0 (GLOVE) ×3
GLOVE EUDERMIC 7 POWDERFREE (GLOVE) ×2 IMPLANT
GOWN PREVENTION PLUS LG XLONG (DISPOSABLE) ×2 IMPLANT
GOWN STRL REIN XL XLG (GOWN DISPOSABLE) ×10 IMPLANT
KIT BASIN OR (CUSTOM PROCEDURE TRAY) ×2 IMPLANT
MARKER SKIN DUAL TIP RULER LAB (MISCELLANEOUS) ×2 IMPLANT
NEEDLE HYPO 22GX1.5 SAFETY (NEEDLE) ×2 IMPLANT
NS IRRIG 1000ML POUR BTL (IV SOLUTION) ×2 IMPLANT
PACK GENERAL/GYN (CUSTOM PROCEDURE TRAY) ×2 IMPLANT
PAD ABD 8X10 STRL (GAUZE/BANDAGES/DRESSINGS) ×2 IMPLANT
SPONGE DRAIN TRACH 4X4 STRL 2S (GAUZE/BANDAGES/DRESSINGS) ×2 IMPLANT
SPONGE GAUZE 4X4 12PLY (GAUZE/BANDAGES/DRESSINGS) ×2 IMPLANT
SPONGE LAP 18X18 X RAY DECT (DISPOSABLE) ×8 IMPLANT
SPONGE LAP 4X18 X RAY DECT (DISPOSABLE) ×2 IMPLANT
STAPLER VISISTAT 35W (STAPLE) ×2 IMPLANT
STRIP CLOSURE SKIN 1/2X4 (GAUZE/BANDAGES/DRESSINGS) IMPLANT
SUT ETHILON 2 0 PS N (SUTURE) ×2 IMPLANT
SUT ETHILON 3 0 PS 1 (SUTURE) IMPLANT
SUT SILK 2 0 (SUTURE) ×1
SUT SILK 2-0 18XBRD TIE 12 (SUTURE) ×1 IMPLANT
SUT SILK 3 0 (SUTURE)
SUT SILK 3-0 18XBRD TIE 12 (SUTURE) IMPLANT
SUT VIC AB 3-0 SH 18 (SUTURE) ×4 IMPLANT
SYR CONTROL 10ML LL (SYRINGE) ×2 IMPLANT
TOWEL OR 17X26 10 PK STRL BLUE (TOWEL DISPOSABLE) ×2 IMPLANT
TOWEL OR NON WOVEN STRL DISP B (DISPOSABLE) ×2 IMPLANT

## 2013-02-27 NOTE — Progress Notes (Signed)
Alyssa May   DOB:1965-09-02   AV#:[id number]   NWG#:[id number]  Subjective: Nature denies pain, SOB, N, V, D, cough or phlegm production. She is mentally ready for today's surgery. No family in room.   Objective: middle aged African American woman stading by bedside Filed Vitals:   02/27/13 0811  BP: 100/72  Pulse: 84  Temp: 98.1 F (36.7 C)  Resp: 20    Body mass index is 26.79 kg/(m^2).  Intake/Output Summary (Last 24 hours) at 02/27/13 0816 Last data filed at 02/26/13 1900  Gross per 24 hour  Intake      0 ml  Output    200 ml  Net   -200 ml   Exam not repeated:  Sclerae unicteric  Oropharynx clear  No peripheral adenopathy  Lungs clear -- no rales or rhonchi  Heart regular rate and rhythm  Abdomen benign  MSK no focal spinal tenderness, no peripheral edema  Neuro nonfocal    CBG (last 3)  No results found for this basename: GLUCAP,  in the last 72 hours   Labs:  Lab Results  Component Value Date   WBC 5.3 02/26/2013   HGB 10.3* 02/26/2013   HCT 30.7* 02/26/2013   MCV 87.5 02/26/2013   PLT 216 02/26/2013   NEUTROABS 3.4 02/26/2013    @LASTCHEMISTRY @  Urine Studies No results found for this basename: UACOL, UAPR, USPG, UPH, UTP, UGL, UKET, UBIL, UHGB, UNIT, UROB, ULEU, UEPI, UWBC, URBC, UBAC, CAST, CRYS, UCOM, BILUA,  in the last 72 hours  Basic Metabolic Panel:  Recent Labs Lab 02/23/13 0517 02/26/13 1701  NA 135 133*  K 3.9 3.5  CL 96 96  CO2 28 27  GLUCOSE 117* 100*  BUN 7 7  CREATININE 0.74 0.84  CALCIUM 9.3 9.1   GFR Estimated Creatinine Clearance: 84.6 ml/min (by C-G formula based on Cr of 0.84). Liver Function Tests:  Recent Labs Lab 02/23/13 0517 02/26/13 1701  AST 26 26  ALT 10 10  ALKPHOS 76 68  BILITOT 0.2* 0.2*  PROT 7.6 7.7  ALBUMIN 3.7 3.7   No results found for this basename: LIPASE, AMYLASE,  in the last 168 hours No results found for this basename: AMMONIA,  in the last 168 hours Coagulation profile  Recent  Labs Lab 02/23/13 0517 02/26/13 1701  INR 1.02 1.01    CBC:  Recent Labs Lab 02/23/13 0517 02/26/13 1701  WBC 4.0 5.3  NEUTROABS 2.3 3.4  HGB 10.6* 10.3*  HCT 31.2* 30.7*  MCV 88.4 87.5  PLT 166 216   Cardiac Enzymes: No results found for this basename: CKTOTAL, CKMB, CKMBINDEX, TROPONINI,  in the last 168 hours BNP: No components found with this basename: POCBNP,  CBG: No results found for this basename: GLUCAP,  in the last 168 hours D-Dimer No results found for this basename: DDIMER,  in the last 72 hours Hgb A1c No results found for this basename: HGBA1C,  in the last 72 hours Lipid Profile No results found for this basename: CHOL, HDL, LDLCALC, TRIG, CHOLHDL, LDLDIRECT,  in the last 72 hours Thyroid function studies No results found for this basename: TSH, T4TOTAL, FREET3, T3FREE, THYROIDAB,  in the last 72 hours Anemia work up No results found for this basename: VITAMINB12, FOLATE, FERRITIN, TIBC, IRON, RETICCTPCT,  in the last 72 hours Microbiology No results found for this or any previous visit (from the past 240 hour(s)).    Studies:  X-ray Chest Pa And Lateral   02/26/2013  CLINICAL DATA:  History of breast cancer  EXAM: CHEST  2 VIEW  COMPARISON:  10/22/2012  FINDINGS: The heart size and mediastinal contours are within normal limits. Both lungs are clear. The visualized skeletal structures are unremarkable. A right chest wall port a catheter is appreciated inserted via a subclavian approach with tip projecting in the region of the right atrium. There is no radiographic evidence of pulmonary nodules nor masses.  IMPRESSION: No active cardiopulmonary disease.   Electronically Signed   By: Salome Holmes M.D.   On: 02/26/2013 16:38    Assessment: 47 y.o. [place], [name] woman with stage IV breast cancer (liver involvement at presentation but no bone, lung or brain spread):  (1) Status post upper outer quadrant left breast biopsy 03/18/2011 showing a  clinical T4,N1 invasive ductal carcinoma, high grade, triple negative, with an Mib-1 of 97%, with a single liver metastasis pathologically confirmed 03/31/2011.  (2) Status post 4 cycles of Q three-week docetaxel/ doxorubicin/ cyclophosphamide in the neoadjuvant setting, discontinued after 4 cycles due to peripheral neuropathy. Status post 2 cycles of every 3 week carboplatin/gemcitabine, for a total of 6 neoadjuvant cycles altogether, completed 11/09/2011.  (3) The patient refused surgery.  (4) With subsequent tumor re-growth, started Carboplatin/Gemcitabine on 09/10/2012, with treatment on days 1 and 8 of each 21 day cycle, received six cycles, last dose 01/28/2013  (5) for left MRM 02/27/2013  Plan:  Alyssa May is finally having her definitive surgery today. I am hopeful Dr Jamey Ripa will be able to achieve would closure despite the extent of local tumor spread. I will schedule the patient to see [place] within 2 weeks of discharge. I have asked surgery to take the patient on to their service as I will be out of town until 03/04/2013  GREATLY APPRECIATE SURGERY'S HELP TO THIS PATIENT!   Lowella Dell, MD 02/27/2013  8:16 AM

## 2013-02-27 NOTE — Transfer of Care (Signed)
Immediate Anesthesia Transfer of Care Note  Patient: Alyssa May  Procedure(s) Performed: Procedure(s): MASTECTOMY MODIFIED RADICAL (Left)  Patient Location: PACU  Anesthesia Type:General  Level of Consciousness: awake, alert , oriented and patient cooperative  Airway & Oxygen Therapy: Patient Spontanous Breathing and Patient connected to face mask oxygen  Post-op Assessment: Report given to PACU RN, Post -op Vital signs reviewed and stable and Patient moving all extremities  Post vital signs: Reviewed and stable  Complications: No apparent anesthesia complications

## 2013-02-27 NOTE — Anesthesia Preprocedure Evaluation (Addendum)
Anesthesia Evaluation  Patient identified by MRN, date of birth, ID band Patient awake  General Assessment Comment:Revision Port-a-cath  Reviewed: Allergy & Precautions, H&P , NPO status , Patient's Chart, lab work & pertinent test results, reviewed documented beta blocker date and time   Airway   Neck ROM: Full    Dental  (+) Teeth Intact and Dental Advisory Given   Pulmonary neg pulmonary ROS,  breath sounds clear to auscultation        Cardiovascular negative cardio ROS  Rhythm:Regular  Denies cardiac symptoms   Neuro/Psych Seizures -,  negative psych ROS   GI/Hepatic negative GI ROS, Neg liver ROS,   Endo/Other  negative endocrine ROS  Renal/GU negative Renal ROS     Musculoskeletal negative musculoskeletal ROS (+)   Abdominal   Peds  Hematology  (+) anemia , Anemia Hgb 8.0   Anesthesia Other Findings   Reproductive/Obstetrics Breast Cancer                         Anesthesia Physical  Anesthesia Plan  ASA: II  Anesthesia Plan: General   Post-op Pain Management:    Induction: Intravenous  Airway Management Planned: Oral ETT  Additional Equipment:   Intra-op Plan:   Post-operative Plan: Extubation in OR  Informed Consent: I have reviewed the patients History and Physical, chart, labs and discussed the procedure including the risks, benefits and alternatives for the proposed anesthesia with the patient or authorized representative who has indicated his/her understanding and acceptance.   Dental advisory given  Plan Discussed with: CRNA  Anesthesia Plan Comments:         Anesthesia Quick Evaluation

## 2013-02-27 NOTE — Anesthesia Postprocedure Evaluation (Signed)
Anesthesia Post Note  Patient: Alyssa May  Procedure(s) Performed: Procedure(s) (LRB): MASTECTOMY MODIFIED RADICAL (Left)  Anesthesia type: General  Patient location: PACU  Post pain: Pain level controlled  Post assessment: Post-op Vital signs reviewed  Last Vitals: BP 99/52  Pulse 91  Temp(Src) 36.5 C (Oral)  Resp 14  Ht 5\' 5"  (1.651 m)  Wt 161 lb (73.029 kg)  BMI 26.79 kg/m2  SpO2 100%  Post vital signs: Reviewed  Level of consciousness: sedated  Complications: No apparent anesthesia complications

## 2013-02-27 NOTE — Consult Note (Signed)
WOC wound consult note Reason for Consult:Patient consult requested due to continued bleeding from breast lesion.  Known to me from previous admission in 07/2011.Patient is scheduled for surgery today (moved up from next week) and CCS team to manage case post operatively, per Dr. Darrall Dears note.  I will remain available to assist them if needed. Wound type:fungating breast tumor with intermittent sanguinous exudate and moderate odor. Pressure Ulcer POA: No Measurement:Not measured: Dressing procedure/placement/frequency:Absorptive dressings in place. WOC nursing team will not follow, but will remain available to this patient, the nursing, medical and surgical teams.  Please re-consult if needed. Thanks, Ladona Mow, MSN, RN, GNP, [place], [facility] [phone])

## 2013-02-27 NOTE — Progress Notes (Signed)
Breast cancer of upper-outer quadrant of left female breast  Assessment: Left breast cancer, ready for surgery  Plan: Left MRM this am. Patient has no more questions, left breast marked as the operative side   Subjective: Feels OK, no change from yesterday  Objective: Vital signs in last 24 hours: Temp:  [97.7 F (36.5 C)-98.1 F (36.7 C)] 98.1 F (36.7 C) (12/24 0811) Pulse Rate:  [83-95] 84 (12/24 0811) Resp:  [18-20] 20 (12/24 0811) BP: (97-104)/(39-72) 100/72 mmHg (12/24 0811) SpO2:  [97 %-100 %] 98 % (12/24 0811) Weight:  [161 lb (73.029 kg)] 161 lb (73.029 kg) (12/23 1604) Last BM Date: 02/28/13  Intake/Output from previous day: 12/23 0701 - 12/24 0700 In: -  Out: 200 [Urine:200]  General appearance: alert, cooperative and no distress Resp: clear to auscultation bilaterally  Lab Results:  Results for orders placed during the hospital encounter of 02/26/13 (from the past 24 hour(s))  COMPREHENSIVE METABOLIC PANEL     Status: Abnormal   Collection Time    02/26/13  5:01 PM      Result Value Range   Sodium 133 (*) 135 - 145 mEq/L   Potassium 3.5  3.5 - 5.1 mEq/L   Chloride 96  96 - 112 mEq/L   CO2 27  19 - 32 mEq/L   Glucose, Bld 100 (*) 70 - 99 mg/dL   BUN 7  6 - 23 mg/dL   Creatinine, Ser 1.47  0.50 - 1.10 mg/dL   Calcium 9.1  8.4 - 82.9 mg/dL   Total Protein 7.7  6.0 - 8.3 g/dL   Albumin 3.7  3.5 - 5.2 g/dL   AST 26  0 - 37 U/L   ALT 10  0 - 35 U/L   Alkaline Phosphatase 68  39 - 117 U/L   Total Bilirubin 0.2 (*) 0.3 - 1.2 mg/dL   GFR calc non Af Amer 83 (*) >90 mL/min   GFR calc Af Amer >90  >90 mL/min  CBC WITH DIFFERENTIAL     Status: Abnormal   Collection Time    02/26/13  5:01 PM      Result Value Range   WBC 5.3  4.0 - 10.5 K/uL   RBC 3.51 (*) 3.87 - 5.11 MIL/uL   Hemoglobin 10.3 (*) 12.0 - 15.0 g/dL   HCT 56.2 (*) 13.0 - 86.5 %   MCV 87.5  78.0 - 100.0 fL   MCH 29.3  26.0 - 34.0 pg   MCHC 33.6  30.0 - 36.0 g/dL   RDW 78.4 (*) 69.6 - 29.5  %   Platelets 216  150 - 400 K/uL   Neutrophils Relative % 64  43 - 77 %   Neutro Abs 3.4  1.7 - 7.7 K/uL   Lymphocytes Relative 28  12 - 46 %   Lymphs Abs 1.5  0.7 - 4.0 K/uL   Monocytes Relative 8  3 - 12 %   Monocytes Absolute 0.4  0.1 - 1.0 K/uL   Eosinophils Relative 1  0 - 5 %   Eosinophils Absolute 0.0  0.0 - 0.7 K/uL   Basophils Relative 0  0 - 1 %   Basophils Absolute 0.0  0.0 - 0.1 K/uL  APTT     Status: None   Collection Time    02/26/13  5:01 PM      Result Value Range   aPTT 30  24 - 37 seconds  PROTIME-INR     Status: None   Collection Time  02/26/13  5:01 PM      Result Value Range   Prothrombin Time 13.1  11.6 - 15.2 seconds   INR 1.01  0.00 - 1.49  URINALYSIS, ROUTINE W REFLEX MICROSCOPIC     Status: Abnormal   Collection Time    02/26/13  8:55 PM      Result Value Range   Color, Urine AMBER (*) YELLOW   APPearance CLOUDY (*) CLEAR   Specific Gravity, Urine 1.029  1.005 - 1.030   pH 6.0  5.0 - 8.0   Glucose, UA NEGATIVE  NEGATIVE mg/dL   Hgb urine dipstick NEGATIVE  NEGATIVE   Bilirubin Urine NEGATIVE  NEGATIVE   Ketones, ur NEGATIVE  NEGATIVE mg/dL   Protein, ur NEGATIVE  NEGATIVE mg/dL   Urobilinogen, UA 0.2  0.0 - 1.0 mg/dL   Nitrite NEGATIVE  NEGATIVE   Leukocytes, UA MODERATE (*) NEGATIVE  URINE MICROSCOPIC-ADD ON     Status: Abnormal   Collection Time    02/26/13  8:55 PM      Result Value Range   Squamous Epithelial / LPF FEW (*) RARE   WBC, UA 11-20  <3 WBC/hpf   RBC / HPF 0-2  <3 RBC/hpf   Bacteria, UA MANY (*) RARE     Studies/Results Radiology     MEDS, Scheduled . [facility] HOLD] cephALEXin  500 mg Oral BID  . [facility] HOLD] cholecalciferol  2,000 Units Oral q morning - 10a  . [facility] HOLD] lidocaine-prilocaine   Topical Once       LOS: 1 day    Currie Paris, MD, [facility], [place] [phone]   02/27/2013 9:23 AM

## 2013-02-27 NOTE — Preoperative (Signed)
Beta Blockers   Reason not to administer Beta Blockers:Not Applicable 

## 2013-02-27 NOTE — Telephone Encounter (Signed)
appts made per 12/24 POF tried to call home number got an unknown person on answering machine did not leave message CAL for Jan mailed to pt shh

## 2013-02-27 NOTE — Progress Notes (Signed)
Patient aarived to unit at 1420. Arousable. JP drainx2 and dsg to L chest area clean dry and intact.Placed her comfortably in bed.- Alyssa Marin RN

## 2013-02-27 NOTE — Op Note (Signed)
Alyssa May 1966-01-04 [id number] 02/27/2013   Preoperative diagnosis: Stage IV left breast cancer, upper outer quadrant, with fungating, bleeding mass  Postoperative diagnosis: Same  Procedure: Left modified radical mastectomy  Surgeon: Currie Paris, MD, FACS   Anesthesia: General   Clinical History and Indications: This patient has persistent left breast cancer in the upper outer quadrant of her breast. She has broken through her chemotherapy and the cancer is expanding, fungating and bleeding. She was originally scheduled for surgery next week, but was admitted by her medical oncologist yesterday with a request for an urgent mastectomy today because of ongoing problems with the bleeding. I reviewed the situation with the patient and she wished to proceed.   Description of Procedure: The patient was seen in the preoperative area and the plans confirmed, and the left breast was marked as the operative site. Patient is taken to the operating room and after satisfactory general anesthesia was obtained the left breast was prepped and draped in a time out was performed. The fungating mass was in the upper-outer quadrant and fairly wide at the base and very friable. I made an elliptical incision trying to get a nice margin around it. I made a superior incision first and there was vigorous bleeding from all the skin edges and subcutaneous tissue. It took a fair amount of timeTo get all of this bleeding to stop.I then went ahead and raised the medial two thirds of the superior flap going to sternum and clavicle. I then made the inferior incision, the medial half of it and again there was a lot of bleeding from the skin edges and subcutaneous tissue which took a fair amount of time to control with the cautery. There is marked edema of the tissue as well. Once this was controlled I completed the incision connecting with the superior incision lateral to the tumor. I then completed the skin flaps  in the usual fashion going out to the latissimus laterally and well below the inframammary fold. I thought would need a lot of freedom of these flaps to get closure since I needed to take a wide amount of skin at the area of the tumor.  Once the flaps were created I started removing the breast from medial to lateral taking the fascia. This again towards the lateral edge of the pectoralis it took a little bit of that muscle as the tumor seemed very close here. I then freed the breast off of the serrated anterior muscle and the inferior portion of the latissimus.  I then began the axillary dissection. I opened the clavipectoral fascia. Smaller vessels were clipped or cauterized. The second intercostal nerve was divided. Using gentle dissection identified the axillary vein and began to sweep the axillary contents from medial to lateral.Several involved nodes are present. Identified the long thoracic and thoracodorsal nerves and took care to protect these as the axillary contents were swept down. I stayed below the axillary vein but had cleared off and the entire length of the axilla. The final attachments to the latissimus were then divided and the specimen handed off.  I then spent about 20 minutes irrigating and try to be sure everything was completely dry. Once it appeared dry placed 219 Blake drains and secured there with 2-0 nylon sutures. I spent another 20 minutes here gating and checking for hemostasis as there had been a lot of oozing and bleeding from different tiny points throughout the case. Once I thought everything was dry I closed the incision  with interrupted 3-0 Vicryl suture proximal to the skin and staples. I did have some excess skin along the medial aspect of the inferior flap where I thought the skin flap is very thin and I excised that with the scissors. The edge of the skin appeared to bleed nicely so I thought this was all viable.  The patient tolerated the procedure well. There were no  operative complications. Counts were correct. Estimated blood loss was about 700 cc.  Currie Paris, MD, FACS 02/27/2013 12:52 PM

## 2013-02-28 ENCOUNTER — Other Ambulatory Visit: Payer: Self-pay | Admitting: Oncology

## 2013-02-28 LAB — BASIC METABOLIC PANEL
BUN: 4 mg/dL — ABNORMAL LOW (ref 6–23)
CO2: 22 mEq/L (ref 19–32)
Chloride: 110 mEq/L (ref 96–112)
Creatinine, Ser: 0.6 mg/dL (ref 0.50–1.10)
GFR calc Af Amer: >90 mL/min (ref >90)
GFR calc non Af Amer: >90 mL/min (ref >90)
Potassium: 3.2 mEq/L — ABNORMAL LOW (ref 3.5–5.1)

## 2013-02-28 LAB — CALCIUM, IONIZED: Calcium, Ion: 1.18 mmol/L (ref 1.12–1.23)

## 2013-02-28 LAB — CBC
Hemoglobin: 5.7 g/dL — CL (ref 12.0–15.0)
MCHC: 32.9 g/dL (ref 30.0–36.0)
Platelets: 182 10*3/uL (ref 150–400)

## 2013-02-28 LAB — PREPARE RBC (CROSSMATCH)

## 2013-02-28 LAB — URINE CULTURE: Colony Count: NO GROWTH

## 2013-02-28 MED ORDER — POTASSIUM CHLORIDE 20 MEQ/15ML (10%) PO LIQD
20.0000 meq | Freq: Two times a day (BID) | ORAL | Status: AC
  Administered 2013-02-28 (×2): 20 meq via ORAL
  Filled 2013-02-28 (×2): qty 15

## 2013-02-28 MED ORDER — LIDOCAINE-PRILOCAINE 2.5-2.5 % EX CREA
TOPICAL_CREAM | Freq: Once | CUTANEOUS | Status: AC
  Administered 2013-02-28: 11:00:00 via TOPICAL
  Filled 2013-02-28: qty 5

## 2013-02-28 MED ORDER — DIPHENHYDRAMINE HCL 50 MG/ML IJ SOLN
25.0000 mg | Freq: Once | INTRAMUSCULAR | Status: AC
  Administered 2013-02-28: 25 mg via INTRAVENOUS
  Filled 2013-02-28: qty 1

## 2013-02-28 MED ORDER — ACETAMINOPHEN 325 MG PO TABS
650.0000 mg | ORAL_TABLET | Freq: Once | ORAL | Status: AC
  Administered 2013-02-28: 650 mg via ORAL
  Filled 2013-02-28: qty 2

## 2013-02-28 NOTE — Progress Notes (Addendum)
CRITICAL VALUE ALERT  Critical value received:  Ca 5.8  Date of notification:  02/28/13  Time of notification: 0750  Critical value read back: yes  Nurse who received alert:  Eber Jones  MD notified (1st page):  Mohammed MD  Time of first page:  [phone]  Responding MD: Johna Sheriff  Time MD responded:  In person while rounding at 1028

## 2013-02-28 NOTE — Progress Notes (Signed)
CRITICAL VALUE ALERT  Critical value received:  Hgb 5.8  Date of notification:  02/28/13 Time of notification:  1115 Critical value read back:yes  Nurse who received alert:  Kathlene November RN MD notified (1st page):  Luretha Murphy MD Time of first page:  1120 Responding MD:  Luretha Murphy MD Time MD responded:  1150

## 2013-02-28 NOTE — Progress Notes (Signed)
Patient ID: Alyssa May, female   DOB: 13-May-1965, 47 y.o.   MRN: [id number] 1 Day Post-Op  Subjective: Some pain, not severe.  In good spirits  Objective: Vital signs in last 24 hours: Temp:  [97.2 F (36.2 C)-99.3 F (37.4 C)] 98.6 F (37 C) (12/25 0506) Pulse Rate:  [65-105] 82 (12/25 0506) Resp:  [6-18] 18 (12/25 0506) BP: (84-106)/(41-61) 94/46 mmHg (12/25 0506) SpO2:  [94 %-100 %] 100 % (12/25 0506) Last BM Date: 02/26/13  Intake/Output from previous day: 12/24 0701 - 12/25 0700 In: 3800 [I.V.:3800] Out: 4415 [Urine:3450; Drains:365; Blood:600] Intake/Output this shift:    General appearance: alert, cooperative and no distress Incision/Wound: No swelling or bleeding, JP drainage clearing  Lab Results:   Recent Labs  02/26/13 1701  WBC 5.3  HGB 10.3*  HCT 30.7*  PLT 216   BMET  Recent Labs  02/26/13 1701 02/28/13 0500  NA 133* 139  K 3.5 3.2*  CL 96 110  CO2 27 22  GLUCOSE 100* 105*  BUN 7 4*  CREATININE 0.84 0.60  CALCIUM 9.1 5.8*     Studies/Results: X-ray Chest Pa And Lateral   02/26/2013   CLINICAL DATA:  History of breast cancer  EXAM: CHEST  2 VIEW  COMPARISON:  10/22/2012  FINDINGS: The heart size and mediastinal contours are within normal limits. Both lungs are clear. The visualized skeletal structures are unremarkable. A right chest wall port a catheter is appreciated inserted via a subclavian approach with tip projecting in the region of the right atrium. There is no radiographic evidence of pulmonary nodules nor masses.  IMPRESSION: No active cardiopulmonary disease.   Electronically Signed   By: Salome Holmes M.D.   On: 02/26/2013 16:38    Anti-infectives: Anti-infectives   Start     Dose/Rate Route Frequency Ordered Stop   02/28/13 1000  cephALEXin (KEFLEX) capsule 500 mg     500 mg Oral 4 times daily 02/27/13 1500     02/27/13 1600  cephALEXin (KEFLEX) capsule 500 mg     500 mg Oral Every 6 hours 02/27/13 1451 02/27/13 2149   02/27/13 0600  ceFAZolin (ANCEF) IVPB 2 g/50 mL premix     2 g 100 mL/hr over 30 Minutes Intravenous On call to O.R. 02/26/13 1910 02/27/13 0630   02/26/13 2200  cephALEXin (KEFLEX) capsule 500 mg  Status:  Discontinued     500 mg Oral 2 times daily 02/26/13 1555 02/27/13 1451      Assessment/Plan: s/p Procedure(s): MASTECTOMY MODIFIED RADICAL Stable CBC pending Hypokalemia-replace KLC Hypocalcemia-check ionized Ca++   LOS: 2 days    [name] 02/28/2013

## 2013-02-28 NOTE — Progress Notes (Signed)
I reaccessed patient's port after unable to draw labs due to clotting in the line. Once reaccessed line flushed well and I was able to receive good blood return.

## 2013-02-28 NOTE — Progress Notes (Signed)
COURTESY NOTE: Greatly appreciate CCS's help to this patient. She will see me JAN 7 and our office will be calling her with that appointment. Please let our team know if we can be of any help at this point!

## 2013-03-01 DIAGNOSIS — C50419 Malignant neoplasm of upper-outer quadrant of unspecified female breast: Principal | ICD-10-CM

## 2013-03-01 DIAGNOSIS — D500 Iron deficiency anemia secondary to blood loss (chronic): Secondary | ICD-10-CM

## 2013-03-01 LAB — CBC
HCT: 22.4 % — ABNORMAL LOW (ref 36.0–46.0)
MCHC: 33.5 g/dL (ref 30.0–36.0)
Platelets: 156 10*3/uL (ref 150–400)
RBC: 2.59 MIL/uL — ABNORMAL LOW (ref 3.87–5.11)
RDW: 16.6 % — ABNORMAL HIGH (ref 11.5–15.5)
WBC: 7 10*3/uL (ref 4.0–10.5)

## 2013-03-01 LAB — TYPE AND SCREEN: Unit division: 0

## 2013-03-01 LAB — BASIC METABOLIC PANEL
Calcium: 7.4 mg/dL — ABNORMAL LOW (ref 8.4–10.5)
Chloride: 100 mEq/L (ref 96–112)
Creatinine, Ser: 0.74 mg/dL (ref 0.50–1.10)
GFR calc Af Amer: >90 mL/min (ref >90)
GFR calc non Af Amer: >90 mL/min (ref >90)
Sodium: 136 mEq/L (ref 135–145)

## 2013-03-01 MED ORDER — FERROUS GLUCONATE 324 (38 FE) MG PO TABS
324.0000 mg | ORAL_TABLET | Freq: Two times a day (BID) | ORAL | Status: DC
  Administered 2013-03-01 – 2013-03-02 (×3): 324 mg via ORAL
  Filled 2013-03-01 (×5): qty 1

## 2013-03-01 NOTE — Care Management Note (Signed)
   CARE MANAGEMENT NOTE 03/01/2013  Patient:  Alyssa May, Alyssa May   Account Number:  [id number]  Date Initiated:  03/01/2013  Documentation initiated by:  Nylani Michetti  Subjective/Objective Assessment:   47 yo female admitted s/p Procedure(s):  MASTECTOMY MODIFIED RADICAL     Action/Plan:   Home when stable   Anticipated DC Date:     Anticipated DC Plan:  HOME/SELF CARE      DC Planning Services  CM consult      Choice offered to / List presented to:             Status of service:  In process, will continue to follow Medicare Important Message given?   (If response is "NO", the following Medicare IM given date fields will be blank) Date Medicare IM given:   Date Additional Medicare IM given:    Discharge Disposition:    Per UR Regulation:  Reviewed for med. necessity/level of care/duration of stay  If discussed at Long Length of Stay Meetings, dates discussed:    Comments:  03/01/13 1420 Laelia Angelo,MSN,RN [phone] chart reviewed for utilization of services. No needs idnetified at this time.

## 2013-03-01 NOTE — Progress Notes (Signed)
Alyssa May   DOB:1966-02-25   JY#:[id number]   YQM#:[id number]  Subjective:  Events since 12/25 noted. Patient lying comfortable at bedside, but weak.. Denies pain. No GI o GU complaints. JP draining minimal serosanguineous fluid.  She was transfused 2 units PRBCs 02-28-13 for hemoglobin down to 5.7.  Objective: Filed Vitals:   03/01/13 0416  BP: 90/42  Pulse: 93  Temp: 98.2 F (36.8 C)  Resp: 16    Body mass index is 26.79 kg/(m^2).  Intake/Output Summary (Last 24 hours) at 03/01/13 0817 Last data filed at 03/01/13 0719  Gross per 24 hour  Intake   2965 ml  Output   3990 ml  Net  -1025 ml   Exam not repeated:  Sclerae unicteric  Oropharynx clear  No peripheral adenopathy  Lungs clear -- no rales or rhonchi  Heart regular rate and rhythm             Breasts: s/p radical L mastectomy. Bandaged. R port A Cath in place  Abdomen benign  MSK no focal spinal tenderness, no peripheral edema, except some LUE ?lymphedema  Neuro nonfocal    CBG (last 3)  No results found for this basename: GLUCAP,  in the last 72 hours   Labs:  Lab Results  Component Value Date   WBC 7.0 03/01/2013   HGB 7.5* 03/01/2013   HCT 22.4* 03/01/2013   MCV 86.5 03/01/2013   PLT 156 03/01/2013   NEUTROABS 3.4 02/26/2013       Basic Metabolic Panel:  Recent Labs Lab 02/23/13 0517 02/26/13 1701 02/28/13 0500 03/01/13 0423  NA 135 133* 139 136  K 3.9 3.5 3.2* 4.0  CL 96 96 110 100  CO2 28 27 22 29   GLUCOSE 117* 100* 105* 131*  BUN 7 7 4* 3*  CREATININE 0.74 0.84 0.60 0.74  CALCIUM 9.3 9.1 5.8* 7.4*   GFR Estimated Creatinine Clearance: 88.9 ml/min (by C-G formula based on Cr of 0.74). Liver Function Tests:  Recent Labs Lab 02/23/13 0517 02/26/13 1701  AST 26 26  ALT 10 10  ALKPHOS 76 68  BILITOT 0.2* 0.2*  PROT 7.6 7.7  ALBUMIN 3.7 3.7   No results found for this basename: LIPASE, AMYLASE,  in the last 168 hours No results found for this basename: AMMONIA,  in the last  168 hours Coagulation profile  Recent Labs Lab 02/23/13 0517 02/26/13 1701  INR 1.02 1.01    CBC:  Recent Labs Lab 02/23/13 0517 02/26/13 1701 02/28/13 1115 03/01/13 0509  WBC 4.0 5.3 8.3 7.0  NEUTROABS 2.3 3.4  --   --   HGB 10.6* 10.3* 5.7* 7.5*  HCT 31.2* 30.7* 17.3* 22.4*  MCV 88.4 87.5 88.3 86.5  PLT 166 216 182 156    Studies/Results:  X-ray Chest Pa And Lateral  02/26/2013 CLINICAL DATA: History of breast cancer EXAM: CHEST 2 VIEW COMPARISON: 10/22/2012 FINDINGS: The heart size and mediastinal contours are within normal limits. Both lungs are clear. The visualized skeletal structures are unremarkable. A right chest wall port a catheter is appreciated inserted via a subclavian approach with tip projecting in the region of the right atrium. There is no radiographic evidence of pulmonary nodules nor masses. IMPRESSION: No active cardiopulmonary disease. Electronically Signed By: Alyssa May M.D. On: 02/26/2013 16:38     Assessment: 47 y.o. Alyssa May, Alyssa May woman with stage IV breast cancer (liver involvement at presentation but no bone, lung or brain spread):  (1) Status post upper outer quadrant left  breast biopsy 03/18/2011 showing a clinical T4,N1 invasive ductal carcinoma, high grade, triple negative, with an Mib-1 of 97%, with a single liver metastasis pathologically confirmed 03/31/2011.  (2) Status post 4 cycles of Q three-week docetaxel/ doxorubicin/ cyclophosphamide in the neoadjuvant setting, discontinued after 4 cycles due to peripheral neuropathy. Status post 2 cycles of every 3 week carboplatin/gemcitabine, for a total of 6 neoadjuvant cycles altogether, completed 11/09/2011.  (3) The patient refused surgery.  (4) With subsequent tumor re-growth, started Carboplatin/Gemcitabine on 09/10/2012, with treatment on days 1 and 8 of each 21 day cycle, received six cycles, last dose 01/28/2013  (5) S/p left MRM 02/27/2013 6. Anemia,secondary to acute blood  loss  with a Hb of 5.8 on 12/25 s/p transfusion 2 units of blood on 02-28-13 with good response to 7.5 today    Plan:   1.  patient to see Dr. Darnelle May  03-13-2012, appointment  in EMR. Planned d/c on 12/27  2. Anemia, supportive transfusion if needed.  Oral Iron to start today 3. Consider compression hose on LUE once stable for possible LUE lymphedema secondary to radical L mastectomy.   Other medical issues as per admitting team. Appreciate all the care provided to this nice patient.   Alyssa Eke, PA-C 03/01/2013  8:17 AM   Reviewed information in this note and EMR. Agree with note above by Alyssa May. Patient is feeling better this evening, able to get to BR without difficulty, has felt stronger thru day today since 2 units PRBCs yesterday. She is aware of follow up appointment with Dr Alyssa May 03-13-12, hoping for DC 03-02-13. Please call if our service can help while still in hospital.  Thanks Alyssa Span, MD [phone]

## 2013-03-01 NOTE — Progress Notes (Signed)
[facility]  Patient Status: Active  [facility] is providing the following services: RN  If patient discharges after hours, please call [phone].   Lanae Crumbly 03/01/2013, 10:13 AM

## 2013-03-01 NOTE — Progress Notes (Signed)
2 Days Post-Op   Assessment: s/p Procedure(s): MASTECTOMY MODIFIED RADICAL Patient Active Problem List   Diagnosis Date Noted  . Neoplasm related pain (acute) (chronic) 02/26/2013  . Disruption of wound, unspecified 02/26/2013  . Breast cancer metastasized to axillary lymph node 02/26/2013  . Breast carcinoma metastatic, liver 12/17/2012  . Breast cancer of upper-outer quadrant of left female breast 12/04/2012  . Anemia 08/10/2011    Stable post op, still weak from blood loss - likely a combination of pre-admission blood loss from the tumor bleeding and intraoperative blood loss  Plan: Plan for discharge tomorrow Try to increase activity, follow Hgb to be sure she does not need another unit of blood.  Still no heparin as still concerned about low Hgb and potential for bleed Start iron  Subjective: Feels weak, but better than yesterday pain controlled  Objective: Vital signs in last 24 hours: Temp:  [97.2 F (36.2 C)-98.9 F (37.2 C)] 98.2 F (36.8 C) (12/26 0416) Pulse Rate:  [75-97] 93 (12/26 0416) Resp:  [12-18] 16 (12/26 0416) BP: (82-102)/(42-58) 90/42 mmHg (12/26 0416) SpO2:  [98 %-100 %] 100 % (12/26 0416)   Intake/Output from previous day: 12/25 0701 - 12/26 0700 In: 2965 [P.O.:480; I.V.:1810; Blood:625] Out: 2690 [Urine:2500; Drains:190]  General appearance: alert, cooperative, no distress and pale Resp: clear to auscultation bilaterally  Incision: healing well, Drains fairly clear now, no apparent hematoma  Lab Results:   Recent Labs  02/28/13 1115 03/01/13 0509  WBC 8.3 7.0  HGB 5.7* 7.5*  HCT 17.3* 22.4*  PLT 182 156   BMET  Recent Labs  02/28/13 0500 03/01/13 0423  NA 139 136  K 3.2* 4.0  CL 110 100  CO2 22 29  GLUCOSE 105* 131*  BUN 4* 3*  CREATININE 0.60 0.74  CALCIUM 5.8* 7.4*    MEDS, Scheduled . cephALEXin  500 mg Oral QID  . cholecalciferol  2,000 Units Oral q morning - 10a  . lidocaine-prilocaine   Topical Once     Studies/Results: No results found.    LOS: 3 days     Currie Paris, MD, [facility] Surgery, [place] [phone]   03/01/2013 7:53 AM

## 2013-03-02 LAB — CBC
Hemoglobin: 7.2 g/dL — ABNORMAL LOW (ref 12.0–15.0)
Platelets: 178 10*3/uL (ref 150–400)
RBC: 2.47 MIL/uL — ABNORMAL LOW (ref 3.87–5.11)
WBC: 5.6 10*3/uL (ref 4.0–10.5)

## 2013-03-02 MED ORDER — HEPARIN SOD (PORK) LOCK FLUSH 100 UNIT/ML IV SOLN
500.0000 [IU] | Freq: Once | INTRAVENOUS | Status: DC
  Filled 2013-03-02: qty 5

## 2013-03-02 NOTE — Progress Notes (Signed)
Patient discharged to home with husband, discharge instructions reviewed with patient who verbalized understanding. No new RX's given. PAC flushed with saline and heparin.

## 2013-03-02 NOTE — Discharge Summary (Signed)
Physician Discharge Summary  Patient ID:  Alyssa May  MRN: [id number]  DOB/AGE: [birth year] 47 y.o.  Admit date: 02/26/2013 Discharge date: 03/02/2013  Discharge Diagnoses:  1.  Advanced left breast cancer  Urgent Left modified mastectomy - C. Streck - 02/27/2013  Grade 3 Invasive Ductal Carcinoma, 9 cm, 4/15 nodes positive (T4, N2) 2.  Anemia  Hgb - 7.2 - 03/02/2013   Principal Problem:   Breast cancer of upper-outer quadrant of left female breast Active Problems:   Anemia   Breast carcinoma metastatic, liver   Neoplasm related pain (acute) (chronic)   Disruption of wound, unspecified   Breast cancer metastasized to axillary lymph node   Operation: Procedure(s):  Left MASTECTOMY MODIFIED RADICAL on 02/27/2013 - C. Streck  Discharged Condition: good  Hospital Course: Alyssa May is an 47 y.o. female whose primary care physician is Provider Not In System and who was admitted 02/26/2013 with a chief complaint of bleeding, ulcerated, left breast cancer.  She sees Dr. Darnelle Catalan as her med oncologist.   She was brought to the operating room on 02/26/2013 - 02/27/2013 and underwent  Left MASTECTOMY MODIFIED RADICAL.   Post op her Hgb dropped to 5.7 and he was given 2 units PRBC.  She is now ready for d/c.  She has home health care already to help her with her drains/wound.  The discharge instructions were reviewed with the patient.  Consults: None  Significant Diagnostic Studies: Results for orders placed during the hospital encounter of 02/26/13  URINE CULTURE      Result Value Range   Specimen Description URINE, RANDOM     Special Requests NONE     Culture  Setup Time       Value: 02/27/2013 01:58     Performed at [facility] Count       Value: NO GROWTH     Performed at [facility]   Culture       Value: NO GROWTH     Performed at [facility]   Report Status 02/28/2013 FINAL    SURGICAL PCR SCREEN      Result Value Range    MRSA, PCR NEGATIVE  NEGATIVE   Staphylococcus aureus NEGATIVE  NEGATIVE  COMPREHENSIVE METABOLIC PANEL      Result Value Range   Sodium 133 (*) 135 - 145 mEq/L   Potassium 3.5  3.5 - 5.1 mEq/L   Chloride 96  96 - 112 mEq/L   CO2 27  19 - 32 mEq/L   Glucose, Bld 100 (*) 70 - 99 mg/dL   BUN 7  6 - 23 mg/dL   Creatinine, Ser 4.09  0.50 - 1.10 mg/dL   Calcium 9.1  8.4 - 81.1 mg/dL   Total Protein 7.7  6.0 - 8.3 g/dL   Albumin 3.7  3.5 - 5.2 g/dL   AST 26  0 - 37 U/L   ALT 10  0 - 35 U/L   Alkaline Phosphatase 68  39 - 117 U/L   Total Bilirubin 0.2 (*) 0.3 - 1.2 mg/dL   GFR calc non Af Amer 83 (*) >90 mL/min   GFR calc Af Amer >90  >90 mL/min  CBC WITH DIFFERENTIAL      Result Value Range   WBC 5.3  4.0 - 10.5 K/uL   RBC 3.51 (*) 3.87 - 5.11 MIL/uL   Hemoglobin 10.3 (*) 12.0 - 15.0 g/dL   HCT 91.4 (*) 78.2 - 95.6 %  MCV 87.5  78.0 - 100.0 fL   MCH 29.3  26.0 - 34.0 pg   MCHC 33.6  30.0 - 36.0 g/dL   RDW 16.1 (*) 09.6 - 04.5 %   Platelets 216  150 - 400 K/uL   Neutrophils Relative % 64  43 - 77 %   Neutro Abs 3.4  1.7 - 7.7 K/uL   Lymphocytes Relative 28  12 - 46 %   Lymphs Abs 1.5  0.7 - 4.0 K/uL   Monocytes Relative 8  3 - 12 %   Monocytes Absolute 0.4  0.1 - 1.0 K/uL   Eosinophils Relative 1  0 - 5 %   Eosinophils Absolute 0.0  0.0 - 0.7 K/uL   Basophils Relative 0  0 - 1 %   Basophils Absolute 0.0  0.0 - 0.1 K/uL  APTT      Result Value Range   aPTT 30  24 - 37 seconds  PROTIME-INR      Result Value Range   Prothrombin Time 13.1  11.6 - 15.2 seconds   INR 1.01  0.00 - 1.49  URINALYSIS, ROUTINE W REFLEX MICROSCOPIC      Result Value Range   Color, Urine AMBER (*) YELLOW   APPearance CLOUDY (*) CLEAR   Specific Gravity, Urine 1.029  1.005 - 1.030   pH 6.0  5.0 - 8.0   Glucose, UA NEGATIVE  NEGATIVE mg/dL   Hgb urine dipstick NEGATIVE  NEGATIVE   Bilirubin Urine NEGATIVE  NEGATIVE   Ketones, ur NEGATIVE  NEGATIVE mg/dL   Protein, ur NEGATIVE  NEGATIVE mg/dL    Urobilinogen, UA 0.2  0.0 - 1.0 mg/dL   Nitrite NEGATIVE  NEGATIVE   Leukocytes, UA MODERATE (*) NEGATIVE  URINE MICROSCOPIC-ADD ON      Result Value Range   Squamous Epithelial / LPF FEW (*) RARE   WBC, UA 11-20  <3 WBC/hpf   RBC / HPF 0-2  <3 RBC/hpf   Bacteria, UA MANY (*) RARE  BASIC METABOLIC PANEL      Result Value Range   Sodium 139  135 - 145 mEq/L   Potassium 3.2 (*) 3.5 - 5.1 mEq/L   Chloride 110  96 - 112 mEq/L   CO2 22  19 - 32 mEq/L   Glucose, Bld 105 (*) 70 - 99 mg/dL   BUN 4 (*) 6 - 23 mg/dL   Creatinine, Ser 4.09  0.50 - 1.10 mg/dL   Calcium 5.8 (*) 8.4 - 10.5 mg/dL   GFR calc non Af Amer >90  >90 mL/min   GFR calc Af Amer >90  >90 mL/min  CBC      Result Value Range   WBC 8.3  4.0 - 10.5 K/uL   RBC 1.96 (*) 3.87 - 5.11 MIL/uL   Hemoglobin 5.7 (*) 12.0 - 15.0 g/dL   HCT 81.1 (*) 91.4 - 78.2 %   MCV 88.3  78.0 - 100.0 fL   MCH 29.1  26.0 - 34.0 pg   MCHC 32.9  30.0 - 36.0 g/dL   RDW 95.6 (*) 21.3 - 08.6 %   Platelets 182  150 - 400 K/uL  CALCIUM, IONIZED      Result Value Range   Calcium, Ion 1.18  1.12 - 1.23 mmol/L  BASIC METABOLIC PANEL      Result Value Range   Sodium 136  135 - 145 mEq/L   Potassium 4.0  3.5 - 5.1 mEq/L   Chloride 100  96 -  112 mEq/L   CO2 29  19 - 32 mEq/L   Glucose, Bld 131 (*) 70 - 99 mg/dL   BUN 3 (*) 6 - 23 mg/dL   Creatinine, Ser 8.46  0.50 - 1.10 mg/dL   Calcium 7.4 (*) 8.4 - 10.5 mg/dL   GFR calc non Af Amer >90  >90 mL/min   GFR calc Af Amer >90  >90 mL/min  CBC      Result Value Range   WBC 7.0  4.0 - 10.5 K/uL   RBC 2.59 (*) 3.87 - 5.11 MIL/uL   Hemoglobin 7.5 (*) 12.0 - 15.0 g/dL   HCT 96.2 (*) 95.2 - 84.1 %   MCV 86.5  78.0 - 100.0 fL   MCH 29.0  26.0 - 34.0 pg   MCHC 33.5  30.0 - 36.0 g/dL   RDW 32.4 (*) 40.1 - 02.7 %   Platelets 156  150 - 400 K/uL  CBC      Result Value Range   WBC 5.6  4.0 - 10.5 K/uL   RBC 2.47 (*) 3.87 - 5.11 MIL/uL   Hemoglobin 7.2 (*) 12.0 - 15.0 g/dL   HCT 25.3 (*) 66.4 - 40.3 %    MCV 87.9  78.0 - 100.0 fL   MCH 29.1  26.0 - 34.0 pg   MCHC 33.2  30.0 - 36.0 g/dL   RDW 47.4 (*) 25.9 - 56.3 %   Platelets 178  150 - 400 K/uL  TYPE AND SCREEN      Result Value Range   ABO/RH(D) B POS     Antibody Screen NEG     Sample Expiration 03/02/2013     Unit Number [id number]     Blood Component Type RED CELLS,LR     Unit division 00     Status of Unit ISSUED,FINAL     Transfusion Status OK TO TRANSFUSE     Crossmatch Result Compatible     Unit Number [id number]     Blood Component Type RED CELLS,LR     Unit division 00     Status of Unit ISSUED,FINAL     Transfusion Status OK TO TRANSFUSE     Crossmatch Result Compatible    PREPARE RBC (CROSSMATCH)      Result Value Range   Order Confirmation ORDER PROCESSED BY BLOOD BANK      X-ray Chest Pa And Lateral   02/26/2013   CLINICAL DATA:  History of breast cancer  EXAM: CHEST  2 VIEW  COMPARISON:  10/22/2012  FINDINGS: The heart size and mediastinal contours are within normal limits. Both lungs are clear. The visualized skeletal structures are unremarkable. A right chest wall port a catheter is appreciated inserted via a subclavian approach with tip projecting in the region of the right atrium. There is no radiographic evidence of pulmonary nodules nor masses.  IMPRESSION: No active cardiopulmonary disease.   Electronically Signed   By: Salome Holmes M.D.   On: 02/26/2013 16:38   Ct Chest W Contrast  02/05/2013   CLINICAL DATA:  Breast cancer followup  EXAM: CT CHEST, ABDOMEN, AND PELVIS WITH CONTRAST  TECHNIQUE: Multidetector CT imaging of the chest, abdomen and pelvis was performed following the standard protocol during bolus administration of intravenous contrast.  CONTRAST:  OMNIPAQUE IOHEXOL 300 MG/ML  SOLN  COMPARISON:  None.  FINDINGS: CT CHEST FINDINGS  There is no pleural effusion identified. No airspace consolidation or atelectasis identified. Mild cardiac enlargement. No pericardial effusion. No  suspicious  mediastinal or hilar adenopathy identified.  Asymmetric left-sided skin thickening overlies the left breast. Enhancing in heterogeneous mass within the periphery of the left breast measures 6.8 x 4.6 cm, image 29/series 2. On the previous exam this measured 4.8 x 3.2 cm. Pedunculated and ulcerative component to this mass measures 3.3 cm, image 22/series 2. Enlarged left axillary lymph nodes are again noted. Index node measures 2.3 cm, image 17/series 2. This is compared with 1.2 cm previously.  No supraclavicular or right axillary adenopathy. No internal mammary adenopathy identified. Review of the visualized osseous structures shows no aggressive lytic or sclerotic bone lesions.  CT ABDOMEN AND PELVIS FINDINGS  There is no focal liver abnormality. The gallbladder appears within normal limits. No biliary dilatation. Normal appearance of the pancreas. The spleen is normal.  The adrenal glands are both normal. The left kidney is normal. The right kidney is also normal. The urinary bladder is unremarkable. The uterus and adnexal structures are negative.  Normal caliber of the abdominal aorta. No aneurysm. There is no upper abdominal adenopathy noted. No pelvic or inguinal adenopathy noted.  No free fluid or abnormal fluid collections identified.  The stomach appears normal. The small bowel loops have a normal course and caliber and there is no obstruction.  Normal appearance of the colon. Review of the visualized bony structures shows changes of osteitis pubis, image 104/series 2. No aggressive lytic or sclerotic bone lesions identified.  IMPRESSION: CT chest:  1. Increase in size of large, partially ulcerative mass involving the lateral aspect of the left breast. 2. Increase in left axillary adenopathy. CT abdomen and pelvis:  1. No specific features identified to suggest metastatic disease.   Electronically Signed   By: Signa Kell M.D.   On: 02/05/2013 16:26   Discharge Exam:  Filed Vitals:    03/02/13 0520  BP: 105/61  Pulse: 86  Temp: 98.3 F (36.8 C)  Resp: 16   General: WN AA F who is alert and generally healthy appearing.  Lungs: Clear to auscultation and symmetric breath sounds. Heart:  RRR. No murmur or rub. Chest:  Wrapped.  Left mastectomy wound looks good.   The drains have put out 110/50 cc last 24 hours.  Discharge Medications:     Medication List         cephALEXin 500 MG capsule  Commonly known as:  KEFLEX  Take 1 capsule (500 mg total) by mouth 2 (two) times daily.     cholecalciferol 1000 UNITS tablet  Commonly known as:  VITAMIN D  Take 2,000 Units by mouth every morning.     HYDROcodone-acetaminophen 5-325 MG per tablet  Commonly known as:  NORCO/VICODIN  Take 1-2 tablets by mouth every 6 (six) hours as needed for moderate pain (pain).     ondansetron 8 MG tablet  Commonly known as:  ZOFRAN  Take 1 tablet (8 mg total) by mouth every 8 (eight) hours as needed for nausea.     oxyCODONE-acetaminophen 5-325 MG per tablet  Commonly known as:  PERCOCET/ROXICET  Take 1 tablet by mouth every 8 (eight) hours as needed for severe pain.        Disposition: 01-Home or Self Care      Discharge Orders   Future Appointments Provider Department Dept Phone   03/13/2013 2:45 PM [facility] MEDICAL ONCOLOGY [phone]   03/13/2013 3:15 PM Lowella Dell, MD [facility] MEDICAL ONCOLOGY [phone]   03/18/2013 8:30 AM Wilmon Arms.  Tsuei, MD [facility] Surgery, [place] [phone]   Future Orders Complete By Expires   Diet - low sodium heart healthy  As directed    Increase activity slowly  As directed       Activity:  Driving - May drive in 2 or 3 days, if doing well.   Lifting - No lifting > 15 pounds, but use your arms as you would normally.  Wound Care:   Empty drains and record the amount once or twice per day.  Bring the recorded amount to the office.  Diet:  As tolerated.  Follow up  appointment:  Call Dr. Fatima Sanger office [facility] Surgery) at [phone] for an appointment in this week.  Medications and dosages:  Resume your home medications.   Signed: Ovidio Kin, M.D., [facility] Surgery Office:  [phone]  03/02/2013, 9:18 AM

## 2013-03-04 ENCOUNTER — Encounter (HOSPITAL_COMMUNITY): Payer: Self-pay | Admitting: Surgery

## 2013-03-05 ENCOUNTER — Encounter (HOSPITAL_COMMUNITY): Admission: RE | Payer: Self-pay | Source: Ambulatory Visit

## 2013-03-05 ENCOUNTER — Ambulatory Visit (HOSPITAL_COMMUNITY): Admission: RE | Admit: 2013-03-05 | Payer: Medicaid Other | Source: Ambulatory Visit | Admitting: Surgery

## 2013-03-05 SURGERY — MASTECTOMY, MODIFIED RADICAL
Anesthesia: General | Site: Breast | Laterality: Left

## 2013-03-06 ENCOUNTER — Encounter (INDEPENDENT_AMBULATORY_CARE_PROVIDER_SITE_OTHER): Payer: Medicaid Other | Admitting: Surgery

## 2013-03-08 ENCOUNTER — Ambulatory Visit (INDEPENDENT_AMBULATORY_CARE_PROVIDER_SITE_OTHER): Payer: Medicaid Other | Admitting: Surgery

## 2013-03-08 ENCOUNTER — Encounter (INDEPENDENT_AMBULATORY_CARE_PROVIDER_SITE_OTHER): Payer: Self-pay | Admitting: Surgery

## 2013-03-08 VITALS — BP 118/70 | HR 80 | Temp 98.6°F | Resp 14 | Ht 65.0 in | Wt 162.8 lb

## 2013-03-08 DIAGNOSIS — C50419 Malignant neoplasm of upper-outer quadrant of unspecified female breast: Secondary | ICD-10-CM

## 2013-03-08 DIAGNOSIS — C50412 Malignant neoplasm of upper-outer quadrant of left female breast: Secondary | ICD-10-CM

## 2013-03-08 NOTE — Progress Notes (Signed)
[place]reek, MD,  [address]  [place], [place]    [place] Phone:  [phone] FAX:  [phone]   Re:   Alyssa May DOB:   November 24, 1965 MRN:   [id number]  Urgent Office  ASSESSMENT AND PLAN: 1.  Left breast cancer (T4b, N2a)  LMRM done urgently for bleeding- Alyssa May - 02/27/2013  Final path - IDC, Extensive LVI, ulcerates the skin, 4/15 nodes positive, ER/PR - negative, Her2Neu - neg, Ki67 - 97%  Path to patient.  I removed medial drain.  She'll record the amount of the remaining drain and bring that with her on Monday, to see Dr. Georgette May.   2. Anemia   Hgb - 7.2 - 03/02/2013  HISTORY OF PRESENT ILLNESS: Chief Complaint  Patient presents with  . Routine Post Op    1st /o left mm   Alyssa May is a 48 y.o. (DOB: Jul 15, 1965)  AA  female who is a patient of [facility] In [facility] and comes to me Urgent Office for left breast cancer/left mastecotmy. She was supposed to see Dr. Georgette May a couple of days ago, but she was late for the appointment. She comes today with her husband.  She is doing well.  Though she was again late.  She also forgot to bring in the record of her JP drains, but she told me what she thought they were. She is to see Dr. Jana May next week.  Past Medical History  Diagnosis Date  . Breast abscess 03/2011  . History of bronchitis     last time OCt-Nov 2012  . Seizures     as a toddler ,but none since then.  . Joint pain   . History of blood transfusion     in May 2013-no abnormal reaction noted  . Cancer     Left Breast   SOCIAL HISTORY: Married, husband Alyssa May, with patient.  PHYSICAL EXAM: BP 118/70  Pulse 80  Temp(Src) 98.6 F (37 C) (Temporal)  Resp 14  Ht 5' 5"  (1.651 m)  Wt 162 lb 12.8 oz (73.846 kg)  BMI 27.09 kg/m2  Breast:  Left - absent.  Wound looks good.  I removed every other staple.  She has some skin necrosis around the two drains sites.  She did not bring the recorded  amount with her, but said that the lateral drain (axillary) had about 30 cc over the last day, the medial drain had about 5 cc.  I removed the medial drain.  I cleaned the wound and drain sites with betadine and chloroprep.  DATA REVIEWED: Epic notes  Alyssa Overall, MD,  [facility] Surgery, [place] [address]  [place], [place]    [place] Phone:  [phone] FAX:  [phone]

## 2013-03-11 ENCOUNTER — Encounter (INDEPENDENT_AMBULATORY_CARE_PROVIDER_SITE_OTHER): Payer: Self-pay | Admitting: Surgery

## 2013-03-11 ENCOUNTER — Ambulatory Visit (INDEPENDENT_AMBULATORY_CARE_PROVIDER_SITE_OTHER): Payer: Medicaid Other | Admitting: Surgery

## 2013-03-11 VITALS — BP 130/88 | HR 74 | Temp 98.1°F | Resp 14 | Ht 65.0 in | Wt 164.0 lb

## 2013-03-11 DIAGNOSIS — C50419 Malignant neoplasm of upper-outer quadrant of unspecified female breast: Secondary | ICD-10-CM

## 2013-03-11 DIAGNOSIS — C50412 Malignant neoplasm of upper-outer quadrant of left female breast: Secondary | ICD-10-CM

## 2013-03-11 NOTE — Progress Notes (Signed)
Status post left mastectomy on 02/27/13. She had one drain removed as well as half of the staples last week. Her incisions continued to heal well. She is having minimal drain output. We removed the drain and the remainder of the staples. The skin flaps are viable. She still has numbness of the chest wall. This will continue for several months. No sign of seroma.   Follow-up 2-3 weeks.  She is using minimal pain medicine.  She has an appointment this week with Dr. Jana Hakim.  Alyssa May. [name], MD, [facility]  General/ Trauma Surgery  03/11/2013 11:00 AM

## 2013-03-13 ENCOUNTER — Ambulatory Visit (HOSPITAL_BASED_OUTPATIENT_CLINIC_OR_DEPARTMENT_OTHER): Payer: Medicaid Other | Admitting: Oncology

## 2013-03-13 ENCOUNTER — Telehealth: Payer: Self-pay | Admitting: Oncology

## 2013-03-13 ENCOUNTER — Other Ambulatory Visit (HOSPITAL_BASED_OUTPATIENT_CLINIC_OR_DEPARTMENT_OTHER): Payer: Medicaid Other

## 2013-03-13 VITALS — BP 127/71 | HR 90 | Temp 98.1°F | Resp 18

## 2013-03-13 DIAGNOSIS — C787 Secondary malignant neoplasm of liver and intrahepatic bile duct: Secondary | ICD-10-CM

## 2013-03-13 DIAGNOSIS — C50912 Malignant neoplasm of unspecified site of left female breast: Secondary | ICD-10-CM

## 2013-03-13 DIAGNOSIS — C50419 Malignant neoplasm of upper-outer quadrant of unspecified female breast: Secondary | ICD-10-CM

## 2013-03-13 DIAGNOSIS — D649 Anemia, unspecified: Secondary | ICD-10-CM

## 2013-03-13 DIAGNOSIS — Z171 Estrogen receptor negative status [ER-]: Secondary | ICD-10-CM

## 2013-03-13 DIAGNOSIS — C50412 Malignant neoplasm of upper-outer quadrant of left female breast: Secondary | ICD-10-CM

## 2013-03-13 DIAGNOSIS — C773 Secondary and unspecified malignant neoplasm of axilla and upper limb lymph nodes: Secondary | ICD-10-CM

## 2013-03-13 LAB — COMPREHENSIVE METABOLIC PANEL (CC13)
ALBUMIN: 3.2 g/dL — AB (ref 3.5–5.0)
ALT: 8 U/L (ref 0–55)
AST: 13 U/L (ref 5–34)
Alkaline Phosphatase: 53 U/L (ref 40–150)
Anion Gap: 11 mEq/L (ref 3–11)
BUN: 6.9 mg/dL — AB (ref 7.0–26.0)
CO2: 27 mEq/L (ref 22–29)
Calcium: 8.9 mg/dL (ref 8.4–10.4)
Chloride: 103 mEq/L (ref 98–109)
Creatinine: 0.8 mg/dL (ref 0.6–1.1)
Glucose: 98 mg/dl (ref 70–140)
POTASSIUM: 3.7 meq/L (ref 3.5–5.1)
Sodium: 141 mEq/L (ref 136–145)
Total Bilirubin: 0.26 mg/dL (ref 0.20–1.20)
Total Protein: 6.9 g/dL (ref 6.4–8.3)

## 2013-03-13 LAB — CBC WITH DIFFERENTIAL/PLATELET
BASO%: 0.7 % (ref 0.0–2.0)
BASOS ABS: 0 10*3/uL (ref 0.0–0.1)
EOS%: 1.8 % (ref 0.0–7.0)
Eosinophils Absolute: 0.1 10*3/uL (ref 0.0–0.5)
HCT: 26.9 % — ABNORMAL LOW (ref 34.8–46.6)
HGB: 8.8 g/dL — ABNORMAL LOW (ref 11.6–15.9)
LYMPH%: 25.5 % (ref 14.0–49.7)
MCH: 28.9 pg (ref 25.1–34.0)
MCHC: 32.6 g/dL (ref 31.5–36.0)
MCV: 88.6 fL (ref 79.5–101.0)
MONO#: 0.3 10*3/uL (ref 0.1–0.9)
MONO%: 5.5 % (ref 0.0–14.0)
NEUT#: 3.4 10*3/uL (ref 1.5–6.5)
NEUT%: 66.5 % (ref 38.4–76.8)
Platelets: 316 10*3/uL (ref 145–400)
RBC: 3.04 10*6/uL — AB (ref 3.70–5.45)
RDW: 16.6 % — ABNORMAL HIGH (ref 11.2–14.5)
WBC: 5.2 10*3/uL (ref 3.9–10.3)
lymph#: 1.3 10*3/uL (ref 0.9–3.3)

## 2013-03-13 NOTE — Telephone Encounter (Signed)
, °

## 2013-03-13 NOTE — Progress Notes (Signed)
[facility]  [phone] Fax:([phone]  OFFICE PROGRESS NOTE     ID: Alyssa May   DOB: December 09, 1965  MR#: [id number]  ONG#:[id number]   GMW:[id number] Not In System SU: Donnie Mesa, MD RAD ONC: Eppie Gibson, MD    HISTORY OF PRESENT ILLNESS: Alyssa May is a 48 y.o. [name] who first noted a small mass in her left breast 01/2010, shortly after she stopped breast-feeding.  It did not disappear with resumption of her periods and grew over the next year, and particularly since she had her miscarrriage in 12/2010. This brought her to the Emergency Room 03/09/2011 where she was found to have a fluctuant area in the UOQ of the left breast measuring 8.4 cm. The tissue beneath the cystic area extending towards the axilla and medially was described as firm, but not erythematous. The cystic area was lanced and a large amount of serosanguinos material was expressed. The patient was referred to Dr. Verita Lamb who saw her 03/11/2011. By this time the fluid had largely reaccumulated. He incised the lesion, again draining serosanguinous liquid, packed it, and set the patient up for left breast US at the [facility] 03/14/2011. This showed an irregularly marginated mass measuring 7.3 cm, with abnormal axillary adenopathy. On 01/1102013 bilateral mammography confirmed a high-density mass in the left UOQ. This was biopsied, as was a left axillary lymph node. Both biopsies ([id number]) showed invasive ductal carcinoma, high grade, triple negative, with an Mib-1 of 97%.  Her subsequent history is as detailed below.  INTERVAL HISTORY: Alyssa May returns today for followup of her breast cancer. Since her last visit here she underwent definitive surgery, namely a left modified radical mastectomy on 02/27/2013. This showed ([id number]) residual invasive ductal carcinoma, grade 3, measuring 9 cm. There was extensive involvement of the dermal lymphatics. There was minimal treatment  effect. A total of 15 lymph nodes were removed of which for were involved with metastatic disease, 3 of them with macro metastases and one with a micrometastatic deposit. There were 3 additional lymph nodes with isolated tumor cells. The repeat prognostic panel was again triple negative, with an MIB-1 of 90%. There was extensive lymphovascular invasion but margins were negative, although the distance to the deep margin was less than a millimeter broadly.  REVIEW OF SYSTEMS: Alyssa May did remarkably well with her surgery and denies significant problems with pain, fever, or bleeding. She was hypotensive and severely anemic postop but greatly appreciates the way she was treated in the hospital. She is now taking iron supplementation and tolerating that well. She has a binder in place which is a little bit to lose and she requests a new 1. Otherwise a detailed review of systems today is remarkably benign  PAST MEDICAL HISTORY: Past Medical History  Diagnosis Date  . Breast abscess 03/2011  . History of bronchitis     last time OCt-Nov 2012  . Seizures     as a toddler ,but none since then.  . Joint pain   . History of blood transfusion     in May 2013-no abnormal reaction noted  . Cancer     Left Breast    PAST SURGICAL HISTORY: Past Surgical History  Procedure Laterality Date  . Dilation and curettage of uterus  01/04/11  . Breast mass excision  2013  . Tonsillectomy    . Portacath placement  04/12/2011    Procedure: INSERTION PORT-A-CATH;  Surgeon: Imogene Burn. Tsuei, MD;  Location: [facility];  Service:  General;  Laterality: Right;  right subclavian  . Incision and drainage breast abscess  03/31/11  . Mastectomy modified radical Left 02/27/2013    Procedure: MASTECTOMY MODIFIED RADICAL;  Surgeon: Haywood Lasso, MD;  Location: [facility];  Service: General;  Laterality: Left;  . Breast surgery      FAMILY HISTORY Family History  Problem Relation Age of Onset  . Diabetes Mother   . Hypertension  Mother   [name] died from post-operative complications at age 23; the patient's mother is 20, lives in [place] The patient is an only child.   GYNECOLOGIC HISTORY: First pregnancy to term age 67. Her menstrual cycles were interrupted with chemotherapy, but resumed in 05/2012.  She has not had a menses since her menstrual cycle in 05/2012.   SOCIAL HISTORY: She is a Research scientist (medical), living in [place] but originally from the [place] area. She has a good deal of family in this area. Her husband Ciera Beckum is a Geophysicist/field seismologist for a sedan service in [place] he works there 15 days, and then returns home for another 15 days. Currently he is considering a similar job in [place], [place]. Their daughter, [name], is being home schooled.      ADVANCED DIRECTIVES: Not in place   HEALTH MAINTENANCE: History  Substance Use Topics  . Smoking status: Never Smoker   . Smokeless tobacco: Never Used  . Alcohol Use: Yes     Comment: occasionally wine     Colonoscopy:  N/A  PAP: 03/18/2011  Bone density: N/A  Lipid panel: Not on file   Allergies  Allergen Reactions  . Sulfa Antibiotics Swelling    Current Outpatient Prescriptions  Medication Sig Dispense Refill  . cholecalciferol (VITAMIN D) 1000 UNITS tablet Take 2,000 Units by mouth every morning.      [name] HYDROcodone-acetaminophen (NORCO/VICODIN) 5-325 MG per tablet Take 1-2 tablets by mouth every 6 (six) hours as needed for moderate pain (pain).      . LORazepam (ATIVAN) 0.5 MG tablet Take 0.5 mg by mouth every 8 (eight) hours.      . ondansetron (ZOFRAN) 8 MG tablet Take 1 tablet (8 mg total) by mouth every 8 (eight) hours as needed for nausea.  30 tablet  1   No current facility-administered medications for this visit.   Objective: Middle-aged [place] American woman in no acute distress  Filed Vitals:   03/13/13 1521  BP: 127/71  Pulse: 90  Temp: 98.1 F (36.7 C)  Resp: 18     Body mass index is 0.00  kg/(m^2).    ECOG FS: 1 - Symptomatic but completely ambulatory   Filed Weights    Sclerae unicteric, pupils equal and reactive Oropharynx clear and moist-- no thrush or other lesions No cervical or supraclavicular adenopathy Lungs no rales or rhonchi Heart regular rate and rhythm Abd soft, nontender, positive bowel sounds MSK no focal spinal tenderness, no upper extremity lymphedema Neuro: nonfocal, well oriented, positive affect Breasts: The right breast is unremarkable. The left breast is status post modified radical mastectomy. The incision is healing very nicely. There is no evidence of dehiscence, swelling, erythema, or unusual tenderness. The left axilla is benign.    LAB RESULTS: Lab Results  Component Value Date   WBC 5.2 03/13/2013   NEUTROABS 3.4 03/13/2013   HGB 8.8* 03/13/2013   HCT 26.9* 03/13/2013   MCV 88.6 03/13/2013   PLT 316 03/13/2013       Chemistry  Component Value Date/Time   NA 141 03/13/2013 1458   NA 136 03/01/2013 0423   K 3.7 03/13/2013 1458   K 4.0 03/01/2013 0423   CL 100 03/01/2013 0423   CL 103 07/05/2012 1448   CO2 27 03/13/2013 1458   CO2 29 03/01/2013 0423   BUN 6.9* 03/13/2013 1458   BUN 3* 03/01/2013 0423   CREATININE 0.8 03/13/2013 1458   CREATININE 0.74 03/01/2013 0423      Component Value Date/Time   CALCIUM 8.9 03/13/2013 1458   CALCIUM 7.4* 03/01/2013 0423   ALKPHOS 53 03/13/2013 1458   ALKPHOS 68 02/26/2013 1701   AST 13 03/13/2013 1458   AST 26 02/26/2013 1701   ALT 8 03/13/2013 1458   ALT 10 02/26/2013 1701   BILITOT 0.26 03/13/2013 1458   BILITOT 0.2* 02/26/2013 1701       STUDIES: Ct Chest W Contrast  02/05/2013   CLINICAL DATA:  Breast cancer followup  EXAM: CT CHEST, ABDOMEN, AND PELVIS WITH CONTRAST  TECHNIQUE: Multidetector CT imaging of the chest, abdomen and pelvis was performed following the standard protocol during bolus administration of intravenous contrast.  CONTRAST:  136mL OMNIPAQUE IOHEXOL 300 MG/ML  SOLN  COMPARISON:   None.  FINDINGS: CT CHEST FINDINGS  There is no pleural effusion identified. No airspace consolidation or atelectasis identified. Mild cardiac enlargement. No pericardial effusion. No suspicious mediastinal or hilar adenopathy identified.  Asymmetric left-sided skin thickening overlies the left breast. Enhancing in heterogeneous mass within the periphery of the left breast measures 6.8 x 4.6 cm, image 29/series 2. On the previous exam this measured 4.8 x 3.2 cm. Pedunculated and ulcerative component to this mass measures 3.3 cm, image 22/series 2. Enlarged left axillary lymph nodes are again noted. Index node measures 2.3 cm, image 17/series 2. This is compared with 1.2 cm previously.  No supraclavicular or right axillary adenopathy. No internal mammary adenopathy identified. Review of the visualized osseous structures shows no aggressive lytic or sclerotic bone lesions.  CT ABDOMEN AND PELVIS FINDINGS  There is no focal liver abnormality. The gallbladder appears within normal limits. No biliary dilatation. Normal appearance of the pancreas. The spleen is normal.  The adrenal glands are both normal. The left kidney is normal. The right kidney is also normal. The urinary bladder is unremarkable. The uterus and adnexal structures are negative.  Normal caliber of the abdominal aorta. No aneurysm. There is no upper abdominal adenopathy noted. No pelvic or inguinal adenopathy noted.  No free fluid or abnormal fluid collections identified.  The stomach appears normal. The small bowel loops have a normal course and caliber and there is no obstruction.  Normal appearance of the colon. Review of the visualized bony structures shows changes of osteitis pubis, image 104/series 2. No aggressive lytic or sclerotic bone lesions identified.  IMPRESSION: CT chest:  1. Increase in size of large, partially ulcerative mass involving the lateral aspect of the left breast. 2. Increase in left axillary adenopathy. CT abdomen and pelvis:   1. No specific features identified to suggest metastatic disease.   Electronically Signed   By: Kerby Moors M.D.   On: 02/05/2013 16:26    ASSESSMENT: Alyssa May is a 48 y.o.  [place], [place] woman with stage IV breast cancer (liver involvement at presentation but no bone, lung or brain spread):  (1)  Status post upper outer quadrant left breast biopsy 03/18/2011 showing a clinical T4,N1 invasive ductal carcinoma, high grade, triple negative, with an Mib-1 of 97%,  with a single liver metastasis pathologically confirmed 03/31/2011.  (2) Status post 4 cycles of Q three-week docetaxel/ doxorubicin/ cyclophosphamide in the neoadjuvant setting, discontinued after 4 cycles due to peripheral neuropathy.  Status post 2 cycles of every 3 week carboplatin/gemcitabine, for a total of 6 neoadjuvant cycles altogether, completed 11/09/2011.  (3) The patient refused surgery.  (4) With subsequent tumor re-growth, started Carboplatin/Gemcitabine on 09/10/2012, with treatment on days 1 and 8 of each 21 day cycle, received six cycles, last dose 01/28/2013  (5) status post left modified radical mastectomy 02/27/2013 for a residual pT4b pN2a invasive ductal carcinoma, repeat prognostic panel again triple negative, with an MIB-1 of 90%.  (6) radiation therapy to follow definitive surgery  (7) the patient has not decided whether she will eventually want reconstruction   PLAN:  Valory did fine with her surgery, and we discussed the results at length today. Her case was also discussed at this morning's multidisciplinary conference. Note that in the last CT scans obtained preop, there was no evidence of bone, liver, or long residual disease. There was however extensive local involvement with disease.  She will need postmastectomy radiation, and I am referring her to Dr. Lance Morin to get that accomplished. I am also referring her to our lymphedema clinic so she can start learning about massage, get  a sleeve, and learn how to do range of motion exercises. She will need a new binder and I wrote her a prescription for that. Finally I put in a referral for a genetic counseling.  She will have lab work here in about a month. By then her hemoglobin should be over 10, and I have encouraged her to continue to take iron supplementation daily. She will then see me again March 23 and the week before that she will have a repeat PET scan. Depending on what we find at that time we may decide to return to chemotherapy, refer to [facility] for surgery (for example if there is a single solitary liver lesion) and [facility], or simply continue followup  She is expressing some interest in eventual reconstruction. Of course that will have to wait at least 6 months post completion of radiation. I suggested she start getting herself informed by looking at photos of reconstructive pictures, which are readily available.  I am very pleased that we have gotten this far with [facility]. She has a good understanding of the overall plan and agrees with that. She knows to call for any problems that may develop before her next visit here.   Chauncey Cruel, MD   03/13/2013 3:59 PM

## 2013-03-18 ENCOUNTER — Encounter (INDEPENDENT_AMBULATORY_CARE_PROVIDER_SITE_OTHER): Payer: Medicaid Other | Admitting: Surgery

## 2013-03-19 ENCOUNTER — Ambulatory Visit: Payer: Medicaid Other | Attending: Oncology | Admitting: Physical Therapy

## 2013-03-19 DIAGNOSIS — C50919 Malignant neoplasm of unspecified site of unspecified female breast: Secondary | ICD-10-CM | POA: Insufficient documentation

## 2013-03-19 DIAGNOSIS — M25519 Pain in unspecified shoulder: Secondary | ICD-10-CM | POA: Insufficient documentation

## 2013-03-19 DIAGNOSIS — IMO0001 Reserved for inherently not codable concepts without codable children: Secondary | ICD-10-CM | POA: Insufficient documentation

## 2013-03-19 DIAGNOSIS — I890 Lymphedema, not elsewhere classified: Secondary | ICD-10-CM | POA: Insufficient documentation

## 2013-03-19 DIAGNOSIS — M24519 Contracture, unspecified shoulder: Secondary | ICD-10-CM | POA: Insufficient documentation

## 2013-03-21 ENCOUNTER — Other Ambulatory Visit: Payer: Self-pay | Admitting: Oncology

## 2013-03-21 NOTE — Progress Notes (Signed)
Location of Breast Cancer:Left Breast ( Upper Outer Quadrant)  Histology per Pathology Report: 02/27/13 1. Breast, excision, left with additional axillary lymph node - FRAGMENTS OF INVASIVE DUCTAL CARCINOMA. - THREE LYMPH NODES NEGATIVE FOR CARCINOMA (0/3). - SEE COMMENT. 2. Breast, modified radical mastectomy , left with axillary contents - INVASIVE DUCTAL CARCINOMA, GRADE 3. - CARCINOMA COMES TO WITHIN <0.1 CM OF DEEP MARGIN. - EXTENSIVE LYMPHOVASCULAR INVASION. - CARCINOMA ULCERATES SKIN. - FOUR OF TWELVE LYMPH NODES POSITIVE FOR CARCINOMA (4/12).  03/31/11 Diagnosis Skin , left breast abscess - METASTATIC HIGH GRADE CARCINOMA  Diagnosis 1. Lymph node, needle/core biopsy, mass, left axilla - DUCTAL CARCINOMA. - SEE COMMENT. 2. Breast, left, needle core biopsy, mass, UOQ - INVASIVE DUCTAL CARCINOMA.  Receptor Status: ER( 0%), PR (0%), Her2-neu (No amplification), Ki-67 (90%) - Following Lumpectomy on 02/27/13   [name] "first noted a small mass in her left breast 01/2010, shortly after she stopped breast-feeding. It did not disappear with resumption of her periods and grew over the next year, and particularly since she had her miscarrriage in 12/2010. This brought her to the Emergency Room 03/09/2011 where she was found to have a fluctuant area in the UOQ of the left breast measuring 8.4 cm. The tissue beneath the cystic area extending towards the axilla and medially was described as firm, but not erythematous. The cystic area was lanced and a large amount of serosanguinos material was expressed. The patient was referred to Dr. Verita Lamb who saw her 03/11/2011. By this time the fluid had largely reaccumulated. He incised the lesion, again draining serosanguinous liquid, packed it, and set the patient up for left breast US at the [facility] 03/14/2011. This showed an irregularly marginated mass measuring 7.3 cm, with abnormal axillary adenopathy. On 01/1102013 bilateral  mammography confirmed a high-density mass in the left UOQ. This was biopsied, as was a left axillary lymph node. Both biopsies ([id number]) showed invasive ductal carcinoma, high grade, triple negative, with an Mib-1 of 97%."   Past/Anticipated interventions by surgeon, if any: Biopsy  Left Breast and Surgery post chemotherapy  Past/Anticipated interventions by medical oncology, if any: Chemotherapy:  Status post 4 cycles of Q three-week docetaxel/ doxorubicin/ cyclophosphamide in the neoadjuvant setting, discontinued after 4 cycles due to peripheral neuropathy. Status post 2 cycles of every 3 week carboplatin/gemcitabine, for a total of 6 neoadjuvant cycles altogether, completed 11/09/2011.  With subsequent tumor re-growth, started Carboplatin/Gemcitabine on 09/10/2012, with treatment on days 1 and 8 of each 21 day cycle, received six cycles, last dose 01/28/2013      Lymphedema issues, if any:    Pain issues, if any:    SAFETY ISSUES:  Prior radiation: No  Pacemaker/ICD: No  Possible current pregnancy: No      Is the patient on methotrexate: No  Current Complaints / other details:  G 6 - First pregnancy to term age 48. Her menstrual cycles were interrupted with chemotherapy, but resumed in 05/2012. She has not had a menses since her menstrual cycle in 05/2012.   Radiation Therapy after surgery

## 2013-03-22 ENCOUNTER — Ambulatory Visit: Payer: Medicaid Other

## 2013-03-22 ENCOUNTER — Ambulatory Visit
Admission: RE | Admit: 2013-03-22 | Discharge: 2013-03-22 | Disposition: A | Discharge status: Home / Self Care | Payer: Medicaid Other | Source: Ambulatory Visit | Attending: Radiation Oncology | Admitting: Radiation Oncology

## 2013-03-28 ENCOUNTER — Ambulatory Visit: Payer: Medicaid Other | Admitting: Physical Therapy

## 2013-03-29 ENCOUNTER — Ambulatory Visit
Admission: RE | Admit: 2013-03-29 | Discharge: 2013-03-29 | Disposition: A | Discharge status: Home / Self Care | Payer: Medicaid Other | Source: Ambulatory Visit | Attending: Radiation Oncology | Admitting: Radiation Oncology

## 2013-03-29 ENCOUNTER — Ambulatory Visit (INDEPENDENT_AMBULATORY_CARE_PROVIDER_SITE_OTHER): Payer: Medicaid Other | Admitting: Surgery

## 2013-03-29 ENCOUNTER — Encounter: Payer: Self-pay | Admitting: Radiation Oncology

## 2013-03-29 ENCOUNTER — Encounter (INDEPENDENT_AMBULATORY_CARE_PROVIDER_SITE_OTHER): Payer: Self-pay | Admitting: Surgery

## 2013-03-29 VITALS — BP 120/72 | HR 72 | Temp 98.0°F | Resp 18 | Ht 65.0 in | Wt 161.0 lb

## 2013-03-29 VITALS — BP 106/76 | HR 80 | Temp 98.3°F | Resp 20 | Wt 161.8 lb

## 2013-03-29 DIAGNOSIS — C50412 Malignant neoplasm of upper-outer quadrant of left female breast: Secondary | ICD-10-CM

## 2013-03-29 DIAGNOSIS — C787 Secondary malignant neoplasm of liver and intrahepatic bile duct: Secondary | ICD-10-CM | POA: Insufficient documentation

## 2013-03-29 DIAGNOSIS — Z9221 Personal history of antineoplastic chemotherapy: Secondary | ICD-10-CM | POA: Insufficient documentation

## 2013-03-29 DIAGNOSIS — C50919 Malignant neoplasm of unspecified site of unspecified female breast: Secondary | ICD-10-CM | POA: Insufficient documentation

## 2013-03-29 DIAGNOSIS — C50419 Malignant neoplasm of upper-outer quadrant of unspecified female breast: Secondary | ICD-10-CM | POA: Insufficient documentation

## 2013-03-29 DIAGNOSIS — Z9010 Acquired absence of unspecified breast and nipple: Secondary | ICD-10-CM | POA: Insufficient documentation

## 2013-03-29 DIAGNOSIS — D649 Anemia, unspecified: Secondary | ICD-10-CM

## 2013-03-29 DIAGNOSIS — C773 Secondary and unspecified malignant neoplasm of axilla and upper limb lymph nodes: Secondary | ICD-10-CM | POA: Insufficient documentation

## 2013-03-29 HISTORY — DX: Malignant neoplasm of unspecified site of unspecified female breast: C50.919

## 2013-03-29 NOTE — Progress Notes (Signed)
Please see the Nurse Progress Note in the [facility] Initial Consult Encounter for this patient. 

## 2013-03-29 NOTE — Progress Notes (Signed)
Location of Breast Cancer: Left Breast ( Upper Outer Quadrant)   Histology per Pathology Report:  02/27/13  1. Breast, excision, left with additional axillary lymph node  - FRAGMENTS OF INVASIVE DUCTAL CARCINOMA.  - THREE LYMPH NODES NEGATIVE FOR CARCINOMA (0/3).  - SEE COMMENT.  2. Breast, modified radical mastectomy , left with axillary contents  - INVASIVE DUCTAL CARCINOMA, GRADE 3.  - CARCINOMA COMES TO WITHIN <0.1 CM OF DEEP MARGIN.  - EXTENSIVE LYMPHOVASCULAR INVASION.  - CARCINOMA ULCERATES SKIN.  - FOUR OF TWELVE LYMPH NODES POSITIVE FOR CARCINOMA (4/12).  03/31/11  Diagnosis  Skin , left breast abscess  - METASTATIC HIGH GRADE CARCINOMA  Diagnosis  1. Lymph node, needle/core biopsy, mass, left axilla  - DUCTAL CARCINOMA.  - SEE COMMENT.  2. Breast, left, needle core biopsy, mass, UOQ  - INVASIVE DUCTAL CARCINOMA.   Receptor Status: ER( 0%), PR (0%), Her2-neu (No amplification), Ki-67 (90%) - Following Lumpectomy on 02/27/13   Alyssa May "first noted a small mass in her left breast 01/2010, shortly after she stopped breast-feeding. It did not disappear with resumption of her periods and grew over the next year, and particularly since she had her miscarrriage in 12/2010. This brought her to the Emergency Room 03/09/2011 where she was found to have a fluctuant area in the UOQ of the left breast measuring 8.4 cm. The tissue beneath the cystic area extending towards the axilla and medially was described as firm, but not erythematous. The cystic area was lanced and a large amount of serosanguinos material was expressed. The patient was referred to Dr. Verita Lamb who saw her 03/11/2011. By this time the fluid had largely reaccumulated. He incised the lesion, again draining serosanguinous liquid, packed it, and set the patient up for left breast US at the [facility] 03/14/2011. This showed an irregularly marginated mass measuring 7.3 cm, with abnormal axillary adenopathy. On  01/1102013 bilateral mammography confirmed a high-density mass in the left UOQ. This was biopsied, as was a left axillary lymph node. Both biopsies ([id number]) showed invasive ductal carcinoma, high grade, triple negative, with an Mib-1 of 97%."   Past/Anticipated interventions by surgeon, if any: Biopsy Left Breast and Surgery post chemotherapy   Past/Anticipated interventions by medical oncology, if any: Chemotherapy:  Status post 4 cycles of Q three-week docetaxel/ doxorubicin/ cyclophosphamide in the neoadjuvant setting, discontinued after 4 cycles due to peripheral neuropathy. Status post 2 cycles of every 3 week carboplatin/gemcitabine, for a total of 6 neoadjuvant cycles altogether, completed 11/09/2011.  With subsequent tumor re-growth, started Carboplatin/Gemcitabine on 09/10/2012, with treatment on days 1 and 8 of each 21 day cycle, received six cycles, last dose 01/28/2013 -pt did not receive due to lab results, she is unsure of which value was low.  Lymphedema issues, if any: no Pain issues, if any: no  SAFETY ISSUES:  Prior radiation: No  Pacemaker/ICD: No  Possible current pregnancy: No Is the patient on methotrexate: No   Current Complaints / other details: G 6 - First pregnancy to term age 82. One daughter aged 56, home schooled. Her menstrual cycles were interrupted with chemotherapy, but resumed in 05/2012. She has not had a menses since her menstrual cycle in 05/2012.  Radiation Therapy after surgery

## 2013-03-29 NOTE — Progress Notes (Signed)
She returns today for another post-operative visit.  She seems to be doing quite well.  No significant pain or arm swelling on the left.  She has an area of numbness in the lateral part of her left chest at the end of the incision.  Appetite and bowel movements are good.  She has been evaluated by Dr. Pablo Ledger and will undergo radiation therapy as well as possible further chemotherapy.  Her incisions is well-healed with no sign of seroma or infection.  There is one small area of epidermolysis, possibly from tape.  She will treat this with antibiotic ointment until healed.  She may continue treatment with Dr. Jana Hakim and Dr. Pablo Ledger and we will recheck her in 6 months.  She may consider reconstruction after she has completed her radiation therapy, but we will wait to refer her to plastic surgery.  Alyssa May. [name], MD, [facility] Surgery  General/ Trauma Surgery  03/29/2013 4:55 PM

## 2013-03-29 NOTE — Progress Notes (Signed)
Radiation Oncology         [phone]) [phone] ________________________________  Initial outpatient Consultation - Date: 03/29/2013   Name: Alyssa May MRN: [id number]   DOB: [birth year]  REFERRING PHYSICIAN: Magrinat, Virgie Dad, MD  DIAGNOSIS: Stage IV breast cancer (liver mets at diagnosis)  HISTORY OF PRESENT ILLNESS::Alyssa May is a 48 y.o. female  who palpated a left breast mass in 2011 after the birth of her child. She presented to the emergency room in January 2013 where she was found to have an area in the upper outer quadrant of the left breast measuring about 8 cm. This was lanced and serosanguineous drainage was expressed. Biopsy on 03/18/2011 showed ductal carcinoma in the left axillary lymph node and invasive ductal carcinoma in the upper outer quadrant mass. MRI of the bilateral breasts on 03/29/2011 showed a 10.2 x 7.6 x 6.7 cm mass in the deep outer quadrant of the left breast. Possible extension into the pectoralis major minor muscles was suggested with the loss of the normal fat plane between the mass and the muscles. Diffuse enlargement of the left breast was noted with skin thickening. Multiple enlarged left axillary lymph nodes were demonstrated the largest measuring 1.9 cm. No abnormal appearing right breast lymph nodes were noted no abnormal internal mammary nodes were noted. The right breast was negative. She underwent a PET scan on 04/22/2011 which showed again the breast mass with an SUV of 33.6 as well as left axillary adenopathy measuring 2.3-1.9 cm in maximum dimension as well as a 1.6 cm lesion in the lateral left hepatic lobe with an SUV of 13.9. No other areas of metastatic disease were noted. She underwent neoadjuvant chemotherapy and on MRI of the liver on 04/24/2012 was noted to have an interval response in the left liver lesion now measuring 8 x 7 mm. Her most recent imaging was on 12-14 which is a CT of the chest abdomen and pelvis. This showed a growth of the left  breast mass to 6.8 x 4.6 cm previously measuring 4.8 x 3.2 cm and enlarged left axillary lymph nodes. No progressive supra-auricular, right axillary or right breast disease was noted. No sclerotic bone lesions. The liver was normal. She underwent 4 cycles of docetaxel doxorubicin and cyclophosphamide which was discontinued for peripheral neuropathy. She had 2 cycles of carboplatin and gemcitabine which she finished in September 2013. At that time she refuse surgery. Her tumor and had a great response. When her tumor begin to regrow she was restarted on carboplatin and gemcitabine and received 6 more cycles completing that in November. She underwent a left modified radical mastectomy and axillary node dissection on 02/27/2013 semi-emergently when she was having bleeding that could not be controlled. At the time of mastectomy she was found to have a grade 3 invasive ductal carcinoma with extensive lymphovascular invasion and skin ulcerations. 4 of 15 lymph nodes were positive for carcinoma. The margin deep at the pectoralis was broadly close at less than 0.1 cm. The tumor continued to be triple negative. The size was estimated at 9 cm. Extracapsular extension was present. Minimal treatment effect was noted. She is healed up well from her surgery. She is pleased for range of motion. She is ready to proceed on with radiation. She mentions that she would still like to juice during treatment and is looking at hyperbaric oxygen treatments as well.  PREVIOUS RADIATION THERAPY: No  PAST MEDICAL HISTORY:  has a past medical history of Breast abscess (03/2011); History of  bronchitis; Seizures; Joint pain; History of blood transfusion; Cancer; and Breast cancer.    PAST SURGICAL HISTORY: Past Surgical History  Procedure Laterality Date  . Dilation and curettage of uterus  01/04/11  . Breast mass excision  2013  . Tonsillectomy    . Portacath placement  04/12/2011    Procedure: INSERTION PORT-A-CATH;  Surgeon: Imogene Burn. Tsuei, MD;  Location: [facility];  Service: General;  Laterality: Right;  right subclavian  . Incision and drainage breast abscess  03/31/11  . Mastectomy modified radical Left 02/27/2013    Procedure: MASTECTOMY MODIFIED RADICAL;  Surgeon: Haywood Lasso, MD;  Location: [facility];  Service: General;  Laterality: Left;  . Breast surgery      FAMILY HISTORY:  Family History  Problem Relation Age of Onset  . Diabetes Mother   . Hypertension Mother     SOCIAL HISTORY:  History  Substance Use Topics  . Smoking status: Never Smoker   . Smokeless tobacco: Never Used  . Alcohol Use: Yes     Comment: occasionally wine    ALLERGIES: Sulfa antibiotics  MEDICATIONS:  Current Outpatient Prescriptions  Medication Sig Dispense Refill  . cholecalciferol (VITAMIN D) 1000 UNITS tablet Take 2,000 Units by mouth every morning.      . ferrous sulfate 325 (65 FE) MG tablet Take 325 mg by mouth daily with breakfast.      . HYDROcodone-acetaminophen (NORCO/VICODIN) 5-325 MG per tablet Take 1-2 tablets by mouth every 6 (six) hours as needed for moderate pain (pain).      . LORazepam (ATIVAN) 0.5 MG tablet Take 0.5 mg by mouth every 8 (eight) hours as needed.       . ondansetron (ZOFRAN) 8 MG tablet Take 1 tablet (8 mg total) by mouth every 8 (eight) hours as needed for nausea.  30 tablet  1   No current facility-administered medications for this encounter.    REVIEW OF SYSTEMS:  A 15 point review of systems is documented in the electronic medical record. This was obtained by the nursing staff. However, I reviewed this with the patient to discuss relevant findings and make appropriate changes.  Pertinent items are noted in HPI.  PHYSICAL EXAM:  Filed Vitals:   03/29/13 0840  BP: 106/76  Pulse: 80  Temp: 98.3 F (36.8 C)  Resp: 20  .161 lb 12.8 oz (73.392 kg). S/p left mastectomy. Incision healing well with steri-strips in place.  Good range of motion in the left upper extremity. Alert and  oriented x 3 No lymphedema.   LABORATORY DATA:  Lab Results  Component Value Date   WBC 5.2 03/13/2013   HGB 8.8* 03/13/2013   HCT 26.9* 03/13/2013   MCV 88.6 03/13/2013   PLT 316 03/13/2013   Lab Results  Component Value Date   NA 141 03/13/2013   K 3.7 03/13/2013   CL 100 03/01/2013   CO2 27 03/13/2013   Lab Results  Component Value Date   ALT 8 03/13/2013   AST 13 03/13/2013   ALKPHOS 53 03/13/2013   BILITOT 0.26 03/13/2013     RADIOGRAPHY: No results found.    IMPRESSION: [id number] Breast Cancer  PLAN: I spoke with the patient today regarding her diagnosis and options for treatment. We discussed the role of radiation and decreasing local failures in patients who undergo mastectomy. She has several risk factors including skin involvement, a close deep margins, multiple positive lymph nodes and triple negative status. She clearly had a very locally advanced  lesion that did not respond completely to her second round of chemotherapy and likely has residual disease left on the chest wall or in her skin. We discussed that radiation was for daily treatments for 6 weeks area and we discussed the process of simulation the placement tattoos. We discussed the possible use of breath hold technique for cardiac sparing. We discussed skin redness and irritation as well as skin darkness as possible side effects of treatment. We discussed fatigue. We discussed that the skin darkness can be permanent. We discussed the use of radiation does increased complications with reconstruction and necessitates a flap reconstruction. We discussed that she should not become pregnant during treatment and she states that that is not an issue and she is certain she is not pregnant. We discussed that radiation needed to happen sequentially and treatment breaks for decreased efficacy of treatment. I've scheduled her for simulation at her request next week. We will then begin treatment after that. She signed informed consent and agree to  proceed forward. I did mention to her that Dr. Randell Patient not and I have discussed concurrent capecitabine. I will let him know that she has agreed to this and to proceed forward to order that. I did let her know that the use of hyperbaric oxygen treatments during radiation was not recommended. I let her know that increase oxygen species would decrease efficacy of treatment and might increase tumor growth. I strongly suggested that she not proceed forward at these treatments during radiation. I also cautioned her against the use of high doses of antioxidants. I spent 60 minutes  face to face with the patient and more than 50% of that time was spent in counseling and/or coordination of care.   ------------------------------------------------  Thea Silversmith, MD

## 2013-04-02 ENCOUNTER — Ambulatory Visit (INDEPENDENT_AMBULATORY_CARE_PROVIDER_SITE_OTHER): Payer: Medicaid Other | Admitting: Surgery

## 2013-04-02 ENCOUNTER — Encounter (INDEPENDENT_AMBULATORY_CARE_PROVIDER_SITE_OTHER): Payer: Self-pay | Admitting: Surgery

## 2013-04-02 ENCOUNTER — Other Ambulatory Visit: Payer: Self-pay | Admitting: *Deleted

## 2013-04-02 ENCOUNTER — Telehealth: Payer: Self-pay | Admitting: *Deleted

## 2013-04-02 ENCOUNTER — Other Ambulatory Visit (INDEPENDENT_AMBULATORY_CARE_PROVIDER_SITE_OTHER): Payer: Self-pay | Admitting: Surgery

## 2013-04-02 ENCOUNTER — Encounter: Payer: Self-pay | Admitting: Oncology

## 2013-04-02 ENCOUNTER — Ambulatory Visit
Admission: RE | Admit: 2013-04-02 | Discharge: 2013-04-02 | Disposition: A | Discharge status: Home / Self Care | Payer: Medicaid Other | Source: Ambulatory Visit | Attending: Radiation Oncology | Admitting: Radiation Oncology

## 2013-04-02 ENCOUNTER — Ambulatory Visit (HOSPITAL_BASED_OUTPATIENT_CLINIC_OR_DEPARTMENT_OTHER): Payer: Medicaid Other | Admitting: Oncology

## 2013-04-02 VITALS — BP 107/73 | HR 92 | Temp 98.8°F | Resp 18 | Ht 65.0 in | Wt 163.2 lb

## 2013-04-02 VITALS — BP 128/71 | HR 72 | Temp 98.1°F | Resp 14 | Ht 65.0 in | Wt 162.8 lb

## 2013-04-02 DIAGNOSIS — R229 Localized swelling, mass and lump, unspecified: Principal | ICD-10-CM

## 2013-04-02 DIAGNOSIS — C787 Secondary malignant neoplasm of liver and intrahepatic bile duct: Secondary | ICD-10-CM

## 2013-04-02 DIAGNOSIS — G4700 Insomnia, unspecified: Secondary | ICD-10-CM | POA: Insufficient documentation

## 2013-04-02 DIAGNOSIS — R5381 Other malaise: Secondary | ICD-10-CM | POA: Insufficient documentation

## 2013-04-02 DIAGNOSIS — R5383 Other fatigue: Secondary | ICD-10-CM

## 2013-04-02 DIAGNOSIS — I890 Lymphedema, not elsewhere classified: Secondary | ICD-10-CM | POA: Insufficient documentation

## 2013-04-02 DIAGNOSIS — Z510 Encounter for antineoplastic radiation therapy: Secondary | ICD-10-CM | POA: Insufficient documentation

## 2013-04-02 DIAGNOSIS — C773 Secondary and unspecified malignant neoplasm of axilla and upper limb lymph nodes: Secondary | ICD-10-CM

## 2013-04-02 DIAGNOSIS — L819 Disorder of pigmentation, unspecified: Secondary | ICD-10-CM | POA: Insufficient documentation

## 2013-04-02 DIAGNOSIS — C50919 Malignant neoplasm of unspecified site of unspecified female breast: Secondary | ICD-10-CM

## 2013-04-02 DIAGNOSIS — C50412 Malignant neoplasm of upper-outer quadrant of left female breast: Secondary | ICD-10-CM

## 2013-04-02 DIAGNOSIS — C50419 Malignant neoplasm of upper-outer quadrant of unspecified female breast: Secondary | ICD-10-CM

## 2013-04-02 DIAGNOSIS — N6320 Unspecified lump in the left breast, unspecified quadrant: Secondary | ICD-10-CM

## 2013-04-02 DIAGNOSIS — IMO0002 Reserved for concepts with insufficient information to code with codable children: Secondary | ICD-10-CM

## 2013-04-02 MED ORDER — CAPECITABINE 500 MG PO TABS: 1000.0000 mg | ORAL_TABLET | Freq: Two times a day (BID) | ORAL | Status: DC

## 2013-04-02 NOTE — Progress Notes (Signed)
The patient was last seen on 03/29/13.  In the last few days, she has a developed a firm lump below her left mastectomy incision.  There is no inflammation or sign of infection in this area.  It is non-tender.  She brought it to the attention of Dr. Jana Hakim today, who called and asked that she be seen urgently.  In the mid-portion of her incision, about 2 cm below the incision, there is a smooth, firm palpable mass measuring about 1.5 x 2 cm.  This mass is slightly mobile with no sign of infection.   I recommended biopsy of this area to rule out recurrent tumor.  Even though the margins were negative, the deep margin was close and her tumor had been aggressively growing through the chemotherapy.  I prepped with Betadine and anesthetized with 1% lidocaine.  I made a transverse incision and excised most of the mass.  This grossly appears to be a small hematoma.  This was sent for pathologic examination.  We held pressure and used interrupted 3-0 Vicryl for hemostasis.  The wound was closed with 3-0 ethilon and a dry dressing was applied.  She will return next week to discuss pathology and for suture removal.  Imogene Burn. [name], MD, [facility]  General/ Trauma Surgery  04/02/2013 5:01 PM

## 2013-04-02 NOTE — Progress Notes (Addendum)
Name: Alyssa May   MRN: [id number]  Date:  04/02/2013  DOB: 06/23/1965  Status:outpatient    DIAGNOSIS: Breast cancer.  CONSENT VERIFIED: yes   SET UP: Patient is setup supine   IMMOBILIZATION:  The following immobilization was used: Vacloc on a breast board   NARRATIVE: [name] was brought to the [facility].  Identity was confirmed.  All relevant records and images related to the planned course of therapy were reviewed.  Then, the patient was positioned in a stable reproducible clinical set-up for radiation therapy.  Wires were placed to delineate the clinical extent of breast tissue. A wire was placed on the scar as well.  A bb was placed to mark out an area of possible disease recurrence.CT images were obtained.  An isocenter was placed. Skin markings were placed.  The CT images were loaded into the planning software where the target and avoidance structures were contoured.  The radiation prescription was entered and confirmed. The patient was discharged in stable condition and tolerated simulation well.    TREATMENT PLANNING NOTE:  Treatment planning then occurred. I have requested : MLC's, isodose plan, basic dose calculation  I personally designed and supervised the construction of 7 medically necessary complex treatment devices for the protection of critical normal structures including the lungs and contralateral breast as well as the immobilization device which is necessary for set up certainty.   Special treatment procedure will be performed as she will be receiving concurrent xeolda. She will be carefully monitored for increased side effects from treatment.

## 2013-04-02 NOTE — Progress Notes (Signed)
Put [facility] form on nurse's desk.

## 2013-04-02 NOTE — Progress Notes (Signed)
Radiation Oncology         [phone] ________________________________  Name: Alyssa May      MRN: [id number]          Date: 04/02/2013              DOB: Oct 06, 1965  Optical Surface Tracking Plan:  Since intensity modulated radiotherapy (IMRT) and 3D conformal radiation treatment methods are predicated on accurate and precise positioning for treatment, intrafraction motion monitoring is medically necessary to ensure accurate and safe treatment delivery.  The ability to quantify intrafraction motion without excessive ionizing radiation dose can only be performed with optical surface tracking. Accordingly, surface imaging offers the opportunity to obtain 3D measurements of patient position throughout IMRT and 3D treatments without excessive radiation exposure.  I am ordering optical surface tracking for this patient's upcoming course of radiotherapy. ________________________________ Signature   Reference:   Ursula Alert, J, et al. Surface imaging-based analysis of intrafraction motion for breast radiotherapy patients.Journal of [place], n. 6, nov. 2014. ISSN [id number].   Available at: <http://www.jacmp.org/index.php/jacmp/article/view/4957>.

## 2013-04-02 NOTE — Telephone Encounter (Signed)
Pt is here and aware of her appt...td

## 2013-04-03 ENCOUNTER — Telehealth: Payer: Self-pay | Admitting: Oncology

## 2013-04-03 ENCOUNTER — Encounter: Payer: Self-pay | Admitting: *Deleted

## 2013-04-03 NOTE — Progress Notes (Signed)
[facility] Work  Clinical Social Work received referral from Pension scheme manager for support and assessment of needs.  CSW met with pt at [facility] after [facility] to offer support and assess for psychosocial needs.  CSW also reviewed pt's distress screen.  Pt scored a 6 on the on the psychosocial distress thermometer which indicates moderate distress.  Pt stated she was doing "ok", but was a little anxious regarding the status of her diagnosis and the need for additional treatment.  CSW validated pt's feelings and discussed resources and interventions.  CSW also inquired about pt's daughter, home situation and practical needs.  Pt stated her daughter was doing "great", and did not report any needs at this time.    CSW reviewed [place] support services, additional community resources, and encouraged pt to call CSW as needs arise.  CSW also discussed counseling opportunities.  Pt was appreciative of information but did not request services at this time.  Pt currently attends breast cancer support group and has an Network engineer.  CSW provided contact information and encouraged pt to call with questions or concerns.  CSW will continue to follow and support as needed.    Johnnye Lana, MSW, [place] Worker [facility] [phone]

## 2013-04-04 ENCOUNTER — Ambulatory Visit: Payer: Medicaid Other

## 2013-04-04 NOTE — Progress Notes (Signed)
[facility]  [phone] [phone]  OFFICE PROGRESS NOTE     ID: Alyssa May   DOB: January 23, 1966  MR#: [id number]  XIP#:[id number]   QBH:[id number] Not In System SU: Donnie Mesa, MD RAD ONC: Eppie Gibson, MD; Thea Silversmith M.D.    HISTORY OF PRESENT ILLNESS: Alyssa May is a 48 y.o. [place] woman who first noted a small mass in her left breast 01/2010, shortly after she stopped breast-feeding.  It did not disappear with resumption of her periods and grew over the next year, and particularly since she had her miscarrriage in 12/2010. This brought her to the Emergency Room 03/09/2011 where she was found to have a fluctuant area in the UOQ of the left breast measuring 8.4 cm. The tissue beneath the cystic area extending towards the axilla and medially was described as firm, but not erythematous. The cystic area was lanced and a large amount of serosanguinos material was expressed. The patient was referred to Dr. Verita Lamb who saw her 03/11/2011. By this time the fluid had largely reaccumulated. He incised the lesion, again draining serosanguinous liquid, packed it, and set the patient up for left breast US at the [facility] 03/14/2011. This showed an irregularly marginated mass measuring 7.3 cm, with abnormal axillary adenopathy. On 01/1102013 bilateral mammography confirmed a high-density mass in the left UOQ. This was biopsied, as was a left axillary lymph node. Both biopsies ([id number]) showed invasive ductal carcinoma, high grade, triple negative, with an Mib-1 of 97%.  Her subsequent history is as detailed below.  INTERVAL HISTORY: Alyssa May returns today for an unscheduled visit. She was being evaluated by radiation oncology and on the planning CT scan a nodule was noted just beneath the scar in the left chest wall. Dr. Pablo Ledger suggested the patient, and up for further evaluation.  REVIEW OF SYSTEMS: Alyssa May is concerned about this new finding but  otherwise generally stable. She is feeling more tired than she expected at this time. Partly for this is because her daughter has been sick. Sometimes Alyssa May feels her vision is a little blurred. This is very inconstant. She has a runny nose but no cough or shortness of breath, and no pleurisy. Sometimes she feels a little heartburn. She feels forgetful at times. A detailed review of systems today was otherwise noncontributory  PAST MEDICAL HISTORY: Past Medical History  Diagnosis Date  . Breast abscess 03/2011  . History of bronchitis     last time OCt-Nov 2012  . Seizures     as a toddler ,but none since then.  . Joint pain   . History of blood transfusion     in May 2013-no abnormal reaction noted  . Cancer     Left Breast  . Breast cancer     left    PAST SURGICAL HISTORY: Past Surgical History  Procedure Laterality Date  . Dilation and curettage of uterus  01/04/11  . Breast mass excision  2013  . Tonsillectomy    . Portacath placement  04/12/2011    Procedure: INSERTION PORT-A-CATH;  Surgeon: Imogene Burn. Tsuei, MD;  Location: [facility];  Service: General;  Laterality: Right;  right subclavian  . Incision and drainage breast abscess  03/31/11  . Mastectomy modified radical Left 02/27/2013    Procedure: MASTECTOMY MODIFIED RADICAL;  Surgeon: Haywood Lasso, MD;  Location: [facility];  Service: General;  Laterality: Left;  . Breast surgery      FAMILY HISTORY Family History  Problem Relation Age  of Onset  . Diabetes Mother   . Hypertension Mother   [name] died from post-operative complications at age 75; the patient's mother is 66, lives in [place] The patient is an only child.   GYNECOLOGIC HISTORY: First pregnancy to term age 13. Her menstrual cycles were interrupted with chemotherapy, but resumed in 05/2012.  She has not had a menses since her menstrual cycle in 05/2012.   SOCIAL HISTORY: She is a Proofreader, living in [place] but originally from the  [place]. area. She has a good deal of family in this area. Her husband Alyssa May is a Hospital doctor for a sedan service in [place] he works there 15 days, and then returns home for another 15 days. Currently he is considering a similar job in [place], [name]. Their daughter, Alyssa May, is being home schooled.      ADVANCED DIRECTIVES: Not in place   HEALTH MAINTENANCE: History  Substance Use Topics  . Smoking status: Never Smoker   . Smokeless tobacco: Never Used  . Alcohol Use: Yes     Comment: occasionally wine     Colonoscopy:  N/A  PAP: 03/18/2011  Bone density: N/A  Lipid panel: Not on file   Allergies  Allergen Reactions  . Sulfa Antibiotics Swelling    Current Outpatient Prescriptions  Medication Sig Dispense Refill  . capecitabine (XELODA) 500 MG tablet Take 2 tablets (1,000 mg total) by mouth 2 (two) times daily after a meal.  124 tablet  0  . cholecalciferol (VITAMIN D) 1000 UNITS tablet Take 2,000 Units by mouth every morning.      . ferrous sulfate 325 (65 FE) MG tablet Take 325 mg by mouth daily with breakfast.      . HYDROcodone-acetaminophen (NORCO/VICODIN) 5-325 MG per tablet Take 1-2 tablets by mouth every 6 (six) hours as needed for moderate pain (pain).      . LORazepam (ATIVAN) 0.5 MG tablet Take 0.5 mg by mouth every 8 (eight) hours as needed.       . ondansetron (ZOFRAN) 8 MG tablet Take 1 tablet (8 mg total) by mouth every 8 (eight) hours as needed for nausea.  30 tablet  1   No current facility-administered medications for this visit.   Objective: Middle-aged [place] American woman who appears stated age 48 Vitals:   04/02/13 1437  BP: 107/73  Pulse: 92  Temp: 98.8 F (37.1 C)  Resp: 18     Body mass index is 27.16 kg/(m^2).    ECOG FS: 1 - Symptomatic but completely ambulatory   Filed Weights   04/02/13 1437  Weight: 163 lb 3.2 oz (74.027 kg)    Sclerae unicteric Oropharynx clear and moist No cervical or supraclavicular  adenopathy Lungs no rales or rhonchi Heart regular rate and rhythm Abd soft, nontender, positive bowel sounds MSK no focal spinal tenderness, no upper extremity lymphedema Neuro: nonfocal, well oriented, appropriate affect Breasts: The right breast is unremarkable. The left breast is status post modified radical mastectomy. Just beneath the incision, torso medial aspect of the there is a palpable mass measuring approximately 1-1/2 cm, without erythema or tenderness. This is rubbery, not hard, not fluctuant. The left axilla is benign.    LAB RESULTS: Lab Results  Component Value Date   WBC 5.2 03/13/2013   NEUTROABS 3.4 03/13/2013   HGB 8.8* 03/13/2013   HCT 26.9* 03/13/2013   MCV 88.6 03/13/2013   PLT 316 03/13/2013       Chemistry  Component Value Date/Time   NA 141 03/13/2013 1458   NA 136 03/01/2013 0423   K 3.7 03/13/2013 1458   K 4.0 03/01/2013 0423   CL 100 03/01/2013 0423   CL 103 07/05/2012 1448   CO2 27 03/13/2013 1458   CO2 29 03/01/2013 0423   BUN 6.9* 03/13/2013 1458   BUN 3* 03/01/2013 0423   CREATININE 0.8 03/13/2013 1458   CREATININE 0.74 03/01/2013 0423      Component Value Date/Time   CALCIUM 8.9 03/13/2013 1458   CALCIUM 7.4* 03/01/2013 0423   ALKPHOS 53 03/13/2013 1458   ALKPHOS 68 02/26/2013 1701   AST 13 03/13/2013 1458   AST 26 02/26/2013 1701   ALT 8 03/13/2013 1458   ALT 10 02/26/2013 1701   BILITOT 0.26 03/13/2013 1458   BILITOT 0.2* 02/26/2013 1701       STUDIES: Ct Chest W Contrast  02/05/2013   CLINICAL DATA:  Breast cancer followup  EXAM: CT CHEST, ABDOMEN, AND PELVIS WITH CONTRAST  TECHNIQUE: Multidetector CT imaging of the chest, abdomen and pelvis was performed following the standard protocol during bolus administration of intravenous contrast.  CONTRAST:  158mL OMNIPAQUE IOHEXOL 300 MG/ML  SOLN  COMPARISON:  None.  FINDINGS: CT CHEST FINDINGS  There is no pleural effusion identified. No airspace consolidation or atelectasis identified. Mild cardiac  enlargement. No pericardial effusion. No suspicious mediastinal or hilar adenopathy identified.  Asymmetric left-sided skin thickening overlies the left breast. Enhancing in heterogeneous mass within the periphery of the left breast measures 6.8 x 4.6 cm, image 29/series 2. On the previous exam this measured 4.8 x 3.2 cm. Pedunculated and ulcerative component to this mass measures 3.3 cm, image 22/series 2. Enlarged left axillary lymph nodes are again noted. Index node measures 2.3 cm, image 17/series 2. This is compared with 1.2 cm previously.  No supraclavicular or right axillary adenopathy. No internal mammary adenopathy identified. Review of the visualized osseous structures shows no aggressive lytic or sclerotic bone lesions.  CT ABDOMEN AND PELVIS FINDINGS  There is no focal liver abnormality. The gallbladder appears within normal limits. No biliary dilatation. Normal appearance of the pancreas. The spleen is normal.  The adrenal glands are both normal. The left kidney is normal. The right kidney is also normal. The urinary bladder is unremarkable. The uterus and adnexal structures are negative.  Normal caliber of the abdominal aorta. No aneurysm. There is no upper abdominal adenopathy noted. No pelvic or inguinal adenopathy noted.  No free fluid or abnormal fluid collections identified.  The stomach appears normal. The small bowel loops have a normal course and caliber and there is no obstruction.  Normal appearance of the colon. Review of the visualized bony structures shows changes of osteitis pubis, image 104/series 2. No aggressive lytic or sclerotic bone lesions identified.  IMPRESSION: CT chest:  1. Increase in size of large, partially ulcerative mass involving the lateral aspect of the left breast. 2. Increase in left axillary adenopathy. CT abdomen and pelvis:  1. No specific features identified to suggest metastatic disease.   Electronically Signed   By: Kerby Moors M.D.   On: 02/05/2013 16:26     ASSESSMENT: Alyssa May is a 48 y.o.  Alyssa May, [place] woman with stage IV breast cancer (liver involvement at presentation but no bone, lung or brain spread):  (1)  Status post upper outer quadrant left breast biopsy 03/18/2011 showing a clinical T4,N1 invasive ductal carcinoma, high grade, triple negative, with an Mib-1 of 97%,  with a single liver metastasis pathologically confirmed 03/31/2011.  (2) Status post 4 cycles of Q three-week docetaxel/ doxorubicin/ cyclophosphamide in the neoadjuvant setting, discontinued after 4 cycles due to peripheral neuropathy.  Status post 2 cycles of every 3 week carboplatin/gemcitabine, for a total of 6 neoadjuvant cycles altogether, completed 11/09/2011.  (3) The patient refused surgery.  (4) With subsequent tumor re-growth, started Carboplatin/Gemcitabine on 09/10/2012, with treatment on days 1 and 8 of each 21 day cycle, received six cycles, last dose 01/28/2013  (5) status post left modified radical mastectomy 02/27/2013 for a residual pT4b pN2a invasive ductal carcinoma, repeat prognostic panel again triple negative, with an MIB-1 of 90%.  (6) radiation therapy to follow   (7) the patient has not decided whether she will eventually want reconstruction   PLAN:  I am concerned that Alyssa May is already developing local recurrence. Obviously this is not a good development. I have called Dr.Tsuei and he will see the patient later today for aspiration or biopsy.  Even if this proves to be recurrent cancer, the plan is going to be to continue radiation and we will add capecitabine sensitization. Today I discussed with Chastelyn the possible toxicities, side effects and complications of capecitabine including severe diarrhea, palmar plantar erythrodysesthesia, and others. The patient is agreeable to start the medication and I went ahead and wrote the prescription, which is being processed through [facility].  7 is a ready scheduled to see Dr.  Pablo Ledger next week, by which time the pathology from this lesion should be available. She will see me again the week following. The patient is a good understanding of the overall plan. She agrees with it. She knows the goal of treatment in her case is control.   Chauncey Cruel, MD   04/04/2013 8:23 AM

## 2013-04-08 ENCOUNTER — Encounter: Payer: Self-pay | Admitting: Oncology

## 2013-04-08 NOTE — Progress Notes (Signed)
Faxed form to [facility] @ [phone]

## 2013-04-09 ENCOUNTER — Ambulatory Visit
Admission: RE | Admit: 2013-04-09 | Discharge: 2013-04-09 | Disposition: A | Discharge status: Home / Self Care | Payer: Medicaid Other | Source: Ambulatory Visit | Attending: Radiation Oncology | Admitting: Radiation Oncology

## 2013-04-09 DIAGNOSIS — C50919 Malignant neoplasm of unspecified site of unspecified female breast: Secondary | ICD-10-CM

## 2013-04-09 DIAGNOSIS — C787 Secondary malignant neoplasm of liver and intrahepatic bile duct: Principal | ICD-10-CM

## 2013-04-10 ENCOUNTER — Ambulatory Visit
Admission: RE | Admit: 2013-04-10 | Discharge: 2013-04-10 | Disposition: A | Discharge status: Home / Self Care | Payer: Medicaid Other | Source: Ambulatory Visit | Attending: Radiation Oncology | Admitting: Radiation Oncology

## 2013-04-10 ENCOUNTER — Ambulatory Visit: Payer: Medicaid Other | Attending: Oncology

## 2013-04-10 DIAGNOSIS — C50919 Malignant neoplasm of unspecified site of unspecified female breast: Secondary | ICD-10-CM | POA: Insufficient documentation

## 2013-04-10 DIAGNOSIS — I890 Lymphedema, not elsewhere classified: Secondary | ICD-10-CM | POA: Insufficient documentation

## 2013-04-10 DIAGNOSIS — M25519 Pain in unspecified shoulder: Secondary | ICD-10-CM | POA: Insufficient documentation

## 2013-04-10 DIAGNOSIS — C773 Secondary and unspecified malignant neoplasm of axilla and upper limb lymph nodes: Principal | ICD-10-CM

## 2013-04-10 DIAGNOSIS — IMO0001 Reserved for inherently not codable concepts without codable children: Secondary | ICD-10-CM | POA: Insufficient documentation

## 2013-04-10 DIAGNOSIS — M24519 Contracture, unspecified shoulder: Secondary | ICD-10-CM | POA: Insufficient documentation

## 2013-04-10 MED ORDER — RADIAPLEXRX EX GEL
Freq: Once | CUTANEOUS | Status: AC
  Administered 2013-04-10: 19:00:00 via TOPICAL

## 2013-04-10 NOTE — Progress Notes (Signed)
  Radiation Oncology         [phone]) [phone] ________________________________  Name: Alyssa May MRN: [id number]  Date: 04/09/2013  DOB: [birth year]  Simulation Verification Note  Status: outpatient  NARRATIVE: The patient was brought to the treatment unit and placed in the planned treatment position. The clinical setup was verified. Then port films were obtained and uploaded to the radiation oncology medical record software.  The treatment beams were carefully compared against the planned radiation fields. The position location and shape of the radiation fields was reviewed. The targeted volume of tissue appears appropriately covered by the radiation beams. Organs at risk appear to be excluded as planned.  Based on my personal review, I approved the simulation verification. The patient's treatment will proceed as planned.  ------------------------------------------------  Thea Silversmith, MD

## 2013-04-10 NOTE — Progress Notes (Signed)
Post sim ed completed w/pt. Gave pt "Radiation and You" booklet w/all pertinent information marked and discussed, re: fatigue, skin irritation/care, nutrition, pain. Gave pt Radi[facility]lex w/instructions for proper use. Pt uses [employer] deodorant. All questions answered. Pt verbalized understanding.

## 2013-04-11 ENCOUNTER — Ambulatory Visit
Admission: RE | Admit: 2013-04-11 | Discharge: 2013-04-11 | Disposition: A | Discharge status: Home / Self Care | Payer: Medicaid Other | Source: Ambulatory Visit | Attending: Radiation Oncology | Admitting: Radiation Oncology

## 2013-04-11 ENCOUNTER — Other Ambulatory Visit (HOSPITAL_BASED_OUTPATIENT_CLINIC_OR_DEPARTMENT_OTHER): Payer: Medicaid Other

## 2013-04-11 ENCOUNTER — Ambulatory Visit (HOSPITAL_BASED_OUTPATIENT_CLINIC_OR_DEPARTMENT_OTHER): Payer: Medicaid Other

## 2013-04-11 ENCOUNTER — Other Ambulatory Visit: Payer: Self-pay | Admitting: *Deleted

## 2013-04-11 VITALS — BP 94/66 | HR 85 | Temp 98.0°F

## 2013-04-11 DIAGNOSIS — C50419 Malignant neoplasm of upper-outer quadrant of unspecified female breast: Secondary | ICD-10-CM

## 2013-04-11 DIAGNOSIS — C773 Secondary and unspecified malignant neoplasm of axilla and upper limb lymph nodes: Secondary | ICD-10-CM

## 2013-04-11 DIAGNOSIS — Z95828 Presence of other vascular implants and grafts: Secondary | ICD-10-CM

## 2013-04-11 DIAGNOSIS — C787 Secondary malignant neoplasm of liver and intrahepatic bile duct: Secondary | ICD-10-CM

## 2013-04-11 LAB — COMPREHENSIVE METABOLIC PANEL (CC13)
ALT: 13 U/L (ref 0–55)
ANION GAP: 11 meq/L (ref 3–11)
AST: 20 U/L (ref 5–34)
Albumin: 3.9 g/dL (ref 3.5–5.0)
Alkaline Phosphatase: 54 U/L (ref 40–150)
BUN: 9.5 mg/dL (ref 7.0–26.0)
CO2: 28 meq/L (ref 22–29)
CREATININE: 0.7 mg/dL (ref 0.6–1.1)
Calcium: 9.6 mg/dL (ref 8.4–10.4)
Chloride: 101 mEq/L (ref 98–109)
Glucose: 102 mg/dl (ref 70–140)
Potassium: 4 mEq/L (ref 3.5–5.1)
Sodium: 140 mEq/L (ref 136–145)
Total Bilirubin: 0.52 mg/dL (ref 0.20–1.20)
Total Protein: 7.4 g/dL (ref 6.4–8.3)

## 2013-04-11 LAB — CBC WITH DIFFERENTIAL/PLATELET
BASO%: 0.4 % (ref 0.0–2.0)
Basophils Absolute: 0 10*3/uL (ref 0.0–0.1)
EOS%: 2.8 % (ref 0.0–7.0)
Eosinophils Absolute: 0.1 10*3/uL (ref 0.0–0.5)
HCT: 32.4 % — ABNORMAL LOW (ref 34.8–46.6)
HGB: 10.5 g/dL — ABNORMAL LOW (ref 11.6–15.9)
LYMPH%: 31.5 % (ref 14.0–49.7)
MCH: 26.9 pg (ref 25.1–34.0)
MCHC: 32.5 g/dL (ref 31.5–36.0)
MCV: 82.7 fL (ref 79.5–101.0)
MONO#: 0.2 10*3/uL (ref 0.1–0.9)
MONO%: 7.2 % (ref 0.0–14.0)
NEUT#: 1.5 10*3/uL (ref 1.5–6.5)
NEUT%: 58.1 % (ref 38.4–76.8)
PLATELETS: 281 10*3/uL (ref 145–400)
RBC: 3.92 10*6/uL (ref 3.70–5.45)
RDW: 16.9 % — ABNORMAL HIGH (ref 11.2–14.5)
WBC: 2.6 10*3/uL — ABNORMAL LOW (ref 3.9–10.3)
lymph#: 0.8 10*3/uL — ABNORMAL LOW (ref 0.9–3.3)

## 2013-04-11 MED ORDER — HEPARIN SOD (PORK) LOCK FLUSH 100 UNIT/ML IV SOLN
500.0000 [IU] | Freq: Once | INTRAVENOUS | Status: AC
  Administered 2013-04-11: 500 [IU] via INTRAVENOUS
  Filled 2013-04-11: qty 5

## 2013-04-11 MED ORDER — SODIUM CHLORIDE 0.9 % IJ SOLN
10.0000 mL | INTRAMUSCULAR | Status: DC | PRN
  Administered 2013-04-11: 10 mL via INTRAVENOUS
  Filled 2013-04-11: qty 10

## 2013-04-11 NOTE — Patient Instructions (Signed)

## 2013-04-11 NOTE — Progress Notes (Signed)
Patient in for Port-A-Cath flush and lab draw today. Port accessed, flushed, and deaccessed without any difficulty. Blood obtained for labs without any difficulty. Patient tolerated flush without any complaints. Patient's blood pressure today is 94/66. Patient states, "I feel fine. I have lots of energy, and my appetite is great." Patient denies any dizziness, headaches, pain, or discomfort at this time. Patient reports that she experienced some shortness of breath on 04/07/2013. Patient denies any shortness of breath today. Dr. Virgie Dad nurse, Hinda Lenis, RN made aware. Encouraged Patient to call Center with any questions or concerns or if condition worsens. Patient verbalized understanding.

## 2013-04-12 ENCOUNTER — Ambulatory Visit
Admission: RE | Admit: 2013-04-12 | Discharge: 2013-04-12 | Disposition: A | Discharge status: Home / Self Care | Payer: Medicaid Other | Source: Ambulatory Visit | Attending: Radiation Oncology | Admitting: Radiation Oncology

## 2013-04-12 ENCOUNTER — Ambulatory Visit (INDEPENDENT_AMBULATORY_CARE_PROVIDER_SITE_OTHER): Payer: Medicaid Other | Admitting: Surgery

## 2013-04-12 ENCOUNTER — Encounter (INDEPENDENT_AMBULATORY_CARE_PROVIDER_SITE_OTHER): Payer: Self-pay | Admitting: Surgery

## 2013-04-12 VITALS — BP 118/74 | HR 84 | Temp 98.3°F | Resp 14 | Ht 65.0 in | Wt 164.0 lb

## 2013-04-12 DIAGNOSIS — C787 Secondary malignant neoplasm of liver and intrahepatic bile duct: Secondary | ICD-10-CM

## 2013-04-12 DIAGNOSIS — C50919 Malignant neoplasm of unspecified site of unspecified female breast: Secondary | ICD-10-CM

## 2013-04-12 NOTE — Progress Notes (Signed)
The patient returns after the biopsy of the mass on her left chest. Unfortunately this turned out to be High-grade carcinoma. I discussed this with Dr. Pablo Ledger and Dr. Jana Hakim.  They are planning radiation to this area with chemosensitization. The sutures are removed. The wound is healing. She has some obvious firmness underneath this biopsy site but tumor does not seem to be protruding. The rest of her incision seems to be healing well. She has just completed her third radiation treatment.  We will see her again in 5-6 months.  Alyssa May. [name], MD, [facility]  General/ Trauma Surgery  04/12/2013 10:17 AM

## 2013-04-15 ENCOUNTER — Ambulatory Visit: Payer: Medicaid Other

## 2013-04-16 ENCOUNTER — Ambulatory Visit
Admission: RE | Admit: 2013-04-16 | Discharge: 2013-04-16 | Disposition: A | Discharge status: Home / Self Care | Payer: Medicaid Other | Source: Ambulatory Visit | Attending: Radiation Oncology | Admitting: Radiation Oncology

## 2013-04-16 ENCOUNTER — Ambulatory Visit (HOSPITAL_BASED_OUTPATIENT_CLINIC_OR_DEPARTMENT_OTHER): Payer: Medicaid Other | Admitting: Oncology

## 2013-04-16 ENCOUNTER — Encounter: Payer: Self-pay | Admitting: Radiation Oncology

## 2013-04-16 ENCOUNTER — Other Ambulatory Visit (HOSPITAL_BASED_OUTPATIENT_CLINIC_OR_DEPARTMENT_OTHER): Payer: Medicaid Other

## 2013-04-16 VITALS — BP 123/79 | HR 70 | Temp 98.3°F | Resp 18 | Ht 65.0 in | Wt 168.6 lb

## 2013-04-16 VITALS — BP 100/60 | HR 62 | Temp 98.0°F | Resp 16 | Wt 168.9 lb

## 2013-04-16 DIAGNOSIS — D6481 Anemia due to antineoplastic chemotherapy: Secondary | ICD-10-CM | POA: Insufficient documentation

## 2013-04-16 DIAGNOSIS — R5383 Other fatigue: Secondary | ICD-10-CM

## 2013-04-16 DIAGNOSIS — C787 Secondary malignant neoplasm of liver and intrahepatic bile duct: Secondary | ICD-10-CM

## 2013-04-16 DIAGNOSIS — D649 Anemia, unspecified: Secondary | ICD-10-CM

## 2013-04-16 DIAGNOSIS — R5381 Other malaise: Secondary | ICD-10-CM

## 2013-04-16 DIAGNOSIS — C50919 Malignant neoplasm of unspecified site of unspecified female breast: Secondary | ICD-10-CM

## 2013-04-16 DIAGNOSIS — G609 Hereditary and idiopathic neuropathy, unspecified: Secondary | ICD-10-CM

## 2013-04-16 DIAGNOSIS — C50419 Malignant neoplasm of upper-outer quadrant of unspecified female breast: Secondary | ICD-10-CM

## 2013-04-16 DIAGNOSIS — C50412 Malignant neoplasm of upper-outer quadrant of left female breast: Secondary | ICD-10-CM

## 2013-04-16 DIAGNOSIS — G893 Neoplasm related pain (acute) (chronic): Secondary | ICD-10-CM

## 2013-04-16 DIAGNOSIS — C773 Secondary and unspecified malignant neoplasm of axilla and upper limb lymph nodes: Secondary | ICD-10-CM

## 2013-04-16 DIAGNOSIS — T451X5A Adverse effect of antineoplastic and immunosuppressive drugs, initial encounter: Secondary | ICD-10-CM

## 2013-04-16 DIAGNOSIS — Z171 Estrogen receptor negative status [ER-]: Secondary | ICD-10-CM

## 2013-04-16 DIAGNOSIS — T8130XA Disruption of wound, unspecified, initial encounter: Secondary | ICD-10-CM

## 2013-04-16 LAB — CBC WITH DIFFERENTIAL/PLATELET
BASO%: 0.6 % (ref 0.0–2.0)
Basophils Absolute: 0 10*3/uL (ref 0.0–0.1)
EOS%: 3.4 % (ref 0.0–7.0)
Eosinophils Absolute: 0.1 10*3/uL (ref 0.0–0.5)
HEMATOCRIT: 32.8 % — AB (ref 34.8–46.6)
HEMOGLOBIN: 10.5 g/dL — AB (ref 11.6–15.9)
LYMPH#: 1.1 10*3/uL (ref 0.9–3.3)
LYMPH%: 34.1 % (ref 14.0–49.7)
MCH: 26.9 pg (ref 25.1–34.0)
MCHC: 32.1 g/dL (ref 31.5–36.0)
MCV: 83.8 fL (ref 79.5–101.0)
MONO#: 0.3 10*3/uL (ref 0.1–0.9)
MONO%: 9.2 % (ref 0.0–14.0)
NEUT#: 1.8 10*3/uL (ref 1.5–6.5)
NEUT%: 52.7 % (ref 38.4–76.8)
Platelets: 286 10*3/uL (ref 145–400)
RBC: 3.92 10*6/uL (ref 3.70–5.45)
RDW: 18 % — ABNORMAL HIGH (ref 11.2–14.5)
WBC: 3.4 10*3/uL — ABNORMAL LOW (ref 3.9–10.3)

## 2013-04-16 LAB — COMPREHENSIVE METABOLIC PANEL (CC13)
ALT: 10 U/L (ref 0–55)
ANION GAP: 9 meq/L (ref 3–11)
AST: 20 U/L (ref 5–34)
Albumin: 4.1 g/dL (ref 3.5–5.0)
Alkaline Phosphatase: 64 U/L (ref 40–150)
BUN: 12.1 mg/dL (ref 7.0–26.0)
CALCIUM: 9.5 mg/dL (ref 8.4–10.4)
CHLORIDE: 101 meq/L (ref 98–109)
CO2: 30 meq/L — AB (ref 22–29)
CREATININE: 0.8 mg/dL (ref 0.6–1.1)
GLUCOSE: 87 mg/dL (ref 70–140)
Potassium: 4.3 mEq/L (ref 3.5–5.1)
Sodium: 141 mEq/L (ref 136–145)
Total Bilirubin: 0.2 mg/dL (ref 0.20–1.20)
Total Protein: 7.6 g/dL (ref 6.4–8.3)

## 2013-04-16 NOTE — Progress Notes (Signed)
[place]  [phone] [phone]  OFFICE PROGRESS NOTE     ID: Alyssa May   DOB: Jul 01, 1965  MR#: [id number]  EXH#:[id number]   FYB:[id number] Not In System SU: Alyssa Mesa, MD RAD ONC: Alyssa Gibson, MD; Alyssa May M.D.    HISTORY OF PRESENT ILLNESS: Alyssa May is a 48 y.o. Alyssa May woman who first noted a small mass in her left breast 01/2010, shortly after she stopped breast-feeding.  It did not disappear with resumption of her periods and grew over the next year, and particularly since she had her miscarrriage in 12/2010. This brought her to the Emergency Room 03/09/2011 where she was found to have a fluctuant area in the UOQ of the left breast measuring 8.4 cm. The tissue beneath the cystic area extending towards the axilla and medially was described as firm, but not erythematous. The cystic area was lanced and a large amount of serosanguinos material was expressed. The patient was referred to Dr. Verita May who saw her 03/11/2011. By this time the fluid had largely reaccumulated. He incised the lesion, again draining serosanguinous liquid, packed it, and set the patient up for left breast US at the [facility] 03/14/2011. This showed an irregularly marginated mass measuring 7.3 cm, with abnormal axillary adenopathy. On 01/1102013 bilateral mammography confirmed a high-density mass in the left UOQ. This was biopsied, as was a left axillary lymph node. Both biopsies ([id number]) showed invasive ductal carcinoma, high grade, triple negative, with an Mib-1 of 97%.  Her subsequent history is as detailed below.  INTERVAL HISTORY: Alyssa May returns today for an unscheduled visit. Since her last visit here the nodule that had developed on her left mastectomy scar was excised and proved to be adenocarcinoma. ([id number], on 04/02/2013). Radiation treatment started 04/10/2013. She is receiving capecitabine sensitization and started died on the same day  as the radiation treatments.  Alyssa May has been doing a lot of driving back and forth to [place] to either pick up her mom or take her back. She is thinking of taking their daughter 2 kids past. I encouraged that. She is also considering applying for disability. I have no doubt that she will qualify.  REVIEW OF SYSTEMS: Alyssa May is feeling increasingly tired. She is having significant pain, "like a hot poker" mostly in the left axilla area. She takes hydrocodone/APAP 07/08/2023 for that, but not every day. She is not constipated currently, but she did experience some constipation at the completion of chemotherapy. She feels the capecitabine is helping the constipation. It is not causing diarrhea. She has no nausea or taste perversion issues related to the capecitabine. There has been no palm or plantar erythrodysesthesia. Which she did start experiencing was not flashes, which had improved when she stopped chemotherapy. She had an episode of low blood pressure here possibly due to dehydration. She was short of breath that day. That has not recurred. She feels a little forgetful. She is not anxious or depressed. There have been no unusual headaches, visual changes, dizziness or gait imbalance. A detailed review of systems was otherwise noncontributory  PAST MEDICAL HISTORY: Past Medical History  Diagnosis Date  . Breast abscess 03/2011  . History of bronchitis     last time OCt-Nov 2012  . Seizures     as a toddler ,but none since then.  . Joint pain   . History of blood transfusion     in May 2013-no abnormal reaction noted  . Cancer     Left  Breast  . Breast cancer     left    PAST SURGICAL HISTORY: Past Surgical History  Procedure Laterality Date  . Dilation and curettage of uterus  01/04/11  . Breast mass excision  2013  . Tonsillectomy    . Portacath placement  04/12/2011    Procedure: INSERTION PORT-A-CATH;  Surgeon: Alyssa Burn. Tsuei, MD;  Location: [facility];  Service: General;  Laterality:  Right;  right subclavian  . Incision and drainage breast abscess  03/31/11  . Mastectomy modified radical Left 02/27/2013    Procedure: MASTECTOMY MODIFIED RADICAL;  Surgeon: Alyssa Lasso, MD;  Location: [facility];  Service: General;  Laterality: Left;  . Breast surgery      FAMILY HISTORY Family History  Problem Relation Age of Onset  . Diabetes Mother   . Hypertension Mother   [name] died from post-operative complications at age 34; the patient's mother is 23, lives in [place] The patient is an only child.   GYNECOLOGIC HISTORY: First pregnancy to term age 26. Her menstrual cycles were interrupted with chemotherapy, but resumed in 05/2012.  She has not had a menses since her menstrual cycle in 05/2012.   SOCIAL HISTORY: She is a Research scientist (medical), living in [place] but originally from the [place]. area. She has a good deal of family in this area. Her husband Alyssa May is a Geophysicist/field seismologist for a sedan service in [place] he works there 15 days, and then returns home for another 15 days. Currently he is considering a similar job in [place], [place]. Their daughter, Alyssa May, is being home schooled.      ADVANCED DIRECTIVES: Not in place   HEALTH MAINTENANCE: History  Substance Use Topics  . Smoking status: Never Smoker   . Smokeless tobacco: Never Used  . Alcohol Use: Yes     Comment: occasionally wine     Colonoscopy:  N/A  PAP: 03/18/2011  Bone density: N/A  Lipid panel: Not on file   Allergies  Allergen Reactions  . Sulfa Antibiotics Swelling    Current Outpatient Prescriptions  Medication Sig Dispense Refill  . capecitabine (XELODA) 500 MG tablet Take 2 tablets (1,000 mg total) by mouth 2 (two) times daily after a meal.  124 tablet  0  . cholecalciferol (VITAMIN D) 1000 UNITS tablet Take 2,000 Units by mouth every morning.      . ferrous sulfate 325 (65 FE) MG tablet Take 325 mg by mouth daily with breakfast.      . hyaluronate sodium  (RADIAPLEXRX) GEL Apply 1 application topically 2 (two) times daily.      [name] HYDROcodone-acetaminophen (NORCO/VICODIN) 5-325 MG per tablet Take 1-2 tablets by mouth every 6 (six) hours as needed for moderate pain (pain).      . LORazepam (ATIVAN) 0.5 MG tablet Take 0.5 mg by mouth every 8 (eight) hours as needed.       . ondansetron (ZOFRAN) 8 MG tablet Take 1 tablet (8 mg total) by mouth every 8 (eight) hours as needed for nausea.  30 tablet  1   No current facility-administered medications for this visit.   Objective: Middle-aged [place] American woman who appears stated age  68 Vitals:   04/16/13 1618  BP: 123/79  Pulse: 70  Temp: 98.3 F (36.8 C)  Resp: 18     Body mass index is 28.06 kg/(m^2).    ECOG FS: 2 - Symptomatic, <50% confined to bed   [facility] Weights   04/16/13 1618  Weight:  168 lb 9.6 oz (76.476 kg)    Sclerae unicteric, pupils round and reactive Oropharynx clear and moist No cervical or supraclavicular adenopathy Lungs no rales or rhonchi Heart regular rate and rhythm Abd soft, nontender, positive bowel sounds MSK no focal spinal tenderness, no upper extremity lymphedema Neuro: nonfocal, well oriented, positive affect Breasts: The right breast is unremarkable. The left breast is status post modified radical mastectomy. The site of  scar recurrence on the left chest wall appears to be healing. There is no ulceration. The skin over the left chest wall and axillae is tight The left axilla is benign. There is a small "dogear" of fatty tissue at the lateralmost aspect of the scar   LAB RESULTS: Lab Results  Component Value Date   WBC 3.4* 04/16/2013   NEUTROABS 1.8 04/16/2013   HGB 10.5* 04/16/2013   HCT 32.8* 04/16/2013   MCV 83.8 04/16/2013   PLT 286 04/16/2013       Chemistry      Component Value Date/Time   NA 141 04/16/2013 1611   NA 136 03/01/2013 0423   K 4.3 04/16/2013 1611   K 4.0 03/01/2013 0423   CL 100 03/01/2013 0423   CL 103 07/05/2012 1448    CO2 30* 04/16/2013 1611   CO2 29 03/01/2013 0423   BUN 12.1 04/16/2013 1611   BUN 3* 03/01/2013 0423   CREATININE 0.8 04/16/2013 1611   CREATININE 0.74 03/01/2013 0423      Component Value Date/Time   CALCIUM 9.5 04/16/2013 1611   CALCIUM 7.4* 03/01/2013 0423   ALKPHOS 64 04/16/2013 1611   ALKPHOS 68 02/26/2013 1701   AST 20 04/16/2013 1611   AST 26 02/26/2013 1701   ALT 10 04/16/2013 1611   ALT 10 02/26/2013 1701   BILITOT 0.20 04/16/2013 1611   BILITOT 0.2* 02/26/2013 1701       STUDIES: No results found.   ASSESSMENT: [name] is a 48 y.o.  [place], [place] woman with stage IV breast cancer (liver involvement at presentation but no bone, lung or brain spread):  (1)  Status post upper outer quadrant left breast biopsy 03/18/2011 showing a clinical T4,N1 invasive ductal carcinoma, high grade, triple negative, with an Mib-1 of 97%, with a single liver metastasis pathologically confirmed 03/31/2011.  (2) Status post 4 cycles of Q three-week docetaxel/ doxorubicin/ cyclophosphamide in the neoadjuvant setting, discontinued after 4 cycles due to peripheral neuropathy.  Status post 2 cycles of every 3 week carboplatin/ gemcitabine, for a total of 6 neoadjuvant cycles altogether, completed 11/09/2011.  (3) The patient refused surgery.  (4) With subsequent tumor re-growth, started Carboplatin/ Gemcitabine on 09/10/2012, with treatment on days 1 and 8 of each 21 day cycle, received six cycles, last dose 01/28/2013; response was moderate  (5) status post left modified radical mastectomy 02/27/2013 for a residual pT4b pN2a invasive ductal carcinoma, repeat prognostic panel again triple negative, with an MIB-1 of 90%.  (6) recurrence at left mastectomy wound excised 04/02/2013  (6) radiation therapy to fleft chest wall and regional drainage started 04/10/2013 with capecitabine sensitization (1000 mg po BID on treatment days)  (7) the patient has not decided whether she will  eventually want reconstruction   PLAN:  Marielys is already feeling quite fatigued from her treatments and having some pain, which may be related to the prior surgery as much as to the radiation itself. She is taking hydrocodone and oxycodone alternating, very irregularly, and are most days she does not take any pain  medication. She understands that if she runs out of pain medicines on the weekend and will be very difficult for her to get that filled and therefore she should always check on Friday to make sure she will not need a written prescription before the weekend, so.   She is tolerating the capecitabine without any obvious side effects. We are setting her up for labwork every Monday to make sure there are no significant cytopenias due to either to the capecitabine or to irradiation.   She is going to see me again at the end of March. Shortly before that visit she will have a repeat abdominal MRI to reevaluate the situation in her liver. Once she recovers sufficiently from chemotherapy she will be restaged fully and we will decide at that time whether to start further chemotherapy or observation or to consider a clinical trial.  Son has a good understanding of the overall plan. She agrees with that. She knows a goal of treatment is control. She will call with any problems that may develop before next visit here.   Chauncey Cruel, MD   04/16/2013 5:47 PM

## 2013-04-16 NOTE — Progress Notes (Signed)
Weekly Management Note Current Dose: 7.2  Gy  Projected Dose: 60.4 Gy   Narrative:  The patient presents for routine under treatment assessment.  CBCT/MVCT images/Port film x-rays were reviewed.  The chart was checked. Doing well. Recurrent tumor under skin near scar has grown since sim. Taking xeloda. Would like to go skiing. Does not have lymphedema sleeve. Skin Irritation near drain site scars.  Physical Findings: Weight: 168 lb 14.4 oz (76.613 kg). Palpable mass at mid portion of scar. 3-4 cm in diameter.  Impression:  The patient is tolerating radiation.  Plan:  Continue treatment as planned.

## 2013-04-16 NOTE — Progress Notes (Signed)
Weekly rad txs 4 lcw completed, incision  area c/o sharp poker burn at times, is using radiaplex gel bid, said prosthetic bra rubs against area, gave telfa pads to try when wearing bra, takes xeloda bid, no nausea, no diarrhea,  3:42 PM

## 2013-04-17 ENCOUNTER — Ambulatory Visit
Admission: RE | Admit: 2013-04-17 | Discharge: 2013-04-17 | Disposition: A | Discharge status: Home / Self Care | Payer: Medicaid Other | Source: Ambulatory Visit | Attending: Radiation Oncology | Admitting: Radiation Oncology

## 2013-04-18 ENCOUNTER — Ambulatory Visit
Admission: RE | Admit: 2013-04-18 | Discharge: 2013-04-18 | Disposition: A | Discharge status: Home / Self Care | Payer: Medicaid Other | Source: Ambulatory Visit | Attending: Radiation Oncology | Admitting: Radiation Oncology

## 2013-04-19 ENCOUNTER — Ambulatory Visit
Admission: RE | Admit: 2013-04-19 | Discharge: 2013-04-19 | Disposition: A | Discharge status: Home / Self Care | Payer: Medicaid Other | Source: Ambulatory Visit | Attending: Radiation Oncology | Admitting: Radiation Oncology

## 2013-04-22 ENCOUNTER — Ambulatory Visit
Admission: RE | Admit: 2013-04-22 | Discharge: 2013-04-22 | Disposition: A | Discharge status: Home / Self Care | Payer: Medicaid Other | Source: Ambulatory Visit | Attending: Radiation Oncology | Admitting: Radiation Oncology

## 2013-04-23 ENCOUNTER — Ambulatory Visit
Admission: RE | Admit: 2013-04-23 | Discharge: 2013-04-23 | Disposition: A | Discharge status: Home / Self Care | Payer: Medicaid Other | Source: Ambulatory Visit | Attending: Radiation Oncology | Admitting: Radiation Oncology

## 2013-04-23 ENCOUNTER — Encounter: Payer: Self-pay | Admitting: Radiation Oncology

## 2013-04-23 VITALS — BP 101/70 | HR 76 | Temp 97.8°F | Resp 20 | Wt 167.0 lb

## 2013-04-23 DIAGNOSIS — C773 Secondary and unspecified malignant neoplasm of axilla and upper limb lymph nodes: Principal | ICD-10-CM

## 2013-04-23 DIAGNOSIS — C50919 Malignant neoplasm of unspecified site of unspecified female breast: Secondary | ICD-10-CM

## 2013-04-23 NOTE — Progress Notes (Signed)
Weekly Management Note Current Dose: 16.2  Gy  Projected Dose: 60.4 Gy   Narrative:  The patient presents for routine under treatment assessment.  CBCT/MVCT images/Port film x-rays were reviewed.  The chart was checked. Doing ok. Has not heard from lymphedema sleeve store (somewhere in [place]?). She is going to investigate and see if she can call rehab to have [facility]. Some insomnia and yet fatigued during the day due to hot flashes.   Physical Findings: Weight: 167 lb (75.751 kg). Unchanged  Impression:  The patient is tolerating radiation.  Plan:  Continue treatment as planned. Encouraged her to follow up with lymphedema therapists and let me know if she needs a new prescription. We discussed skin toxicity again.

## 2013-04-23 NOTE — Progress Notes (Signed)
Weekly rad txs left chest wall 9 txs completed, slight swelling, tender, and slight erythema, skin intact, using radiaplex bid, Xeloda 100mg  bid, po, fatigued, but stays awake till 2-3 am nightly, appetite fair 3:50 PM

## 2013-04-24 ENCOUNTER — Ambulatory Visit
Admission: RE | Admit: 2013-04-24 | Discharge: 2013-04-24 | Disposition: A | Discharge status: Home / Self Care | Payer: Medicaid Other | Source: Ambulatory Visit | Attending: Radiation Oncology | Admitting: Radiation Oncology

## 2013-04-25 ENCOUNTER — Ambulatory Visit: Payer: Medicaid Other

## 2013-04-26 ENCOUNTER — Ambulatory Visit
Admission: RE | Admit: 2013-04-26 | Discharge: 2013-04-26 | Disposition: A | Discharge status: Home / Self Care | Payer: Medicaid Other | Source: Ambulatory Visit | Attending: Radiation Oncology | Admitting: Radiation Oncology

## 2013-04-26 ENCOUNTER — Encounter: Payer: Self-pay | Admitting: Physician Assistant

## 2013-04-29 ENCOUNTER — Encounter: Payer: Self-pay | Admitting: *Deleted

## 2013-04-29 ENCOUNTER — Ambulatory Visit
Admission: RE | Admit: 2013-04-29 | Discharge: 2013-04-29 | Disposition: A | Discharge status: Home / Self Care | Payer: Medicaid Other | Source: Ambulatory Visit | Attending: Radiation Oncology | Admitting: Radiation Oncology

## 2013-04-30 ENCOUNTER — Ambulatory Visit
Admission: RE | Admit: 2013-04-30 | Discharge: 2013-04-30 | Disposition: A | Discharge status: Home / Self Care | Payer: Medicaid Other | Source: Ambulatory Visit | Attending: Radiation Oncology | Admitting: Radiation Oncology

## 2013-04-30 VITALS — BP 106/55 | HR 73 | Temp 98.1°F | Wt 165.6 lb

## 2013-04-30 DIAGNOSIS — C50919 Malignant neoplasm of unspecified site of unspecified female breast: Secondary | ICD-10-CM

## 2013-04-30 DIAGNOSIS — C787 Secondary malignant neoplasm of liver and intrahepatic bile duct: Principal | ICD-10-CM

## 2013-04-30 NOTE — Progress Notes (Signed)
Weekly Management Note Current Dose: 23.4   Gy  Projected Dose: 60.4  Gy   Narrative:  The patient presents for routine under treatment assessment.  CBCT/MVCT images/Port film x-rays were reviewed.  The chart was checked. Doing well. Tired. Skin is dark. Taking [place].   Physical Findings: Weight: 165 lb 9.6 oz (75.116 kg). Skin is slightly dark.   Impression:  The patient is tolerating radiation.  Plan:  Continue treatment as planned. Continue radiaplex.

## 2013-04-30 NOTE — Progress Notes (Signed)
Patient here for routine weekly assessment of left breast cancer.Skin discolored, without peeling.Has some burning of skin.Increased fatigue.

## 2013-05-01 ENCOUNTER — Ambulatory Visit
Admission: RE | Admit: 2013-05-01 | Discharge: 2013-05-01 | Disposition: A | Discharge status: Home / Self Care | Payer: Medicaid Other | Source: Ambulatory Visit | Attending: Radiation Oncology | Admitting: Radiation Oncology

## 2013-05-02 ENCOUNTER — Ambulatory Visit: Payer: Medicaid Other

## 2013-05-03 ENCOUNTER — Ambulatory Visit
Admission: RE | Admit: 2013-05-03 | Discharge: 2013-05-03 | Disposition: A | Discharge status: Home / Self Care | Payer: Medicaid Other | Source: Ambulatory Visit | Attending: Radiation Oncology | Admitting: Radiation Oncology

## 2013-05-06 ENCOUNTER — Ambulatory Visit
Admission: RE | Admit: 2013-05-06 | Discharge: 2013-05-06 | Disposition: A | Discharge status: Home / Self Care | Payer: Medicaid Other | Source: Ambulatory Visit | Attending: Radiation Oncology | Admitting: Radiation Oncology

## 2013-05-07 ENCOUNTER — Ambulatory Visit
Admission: RE | Admit: 2013-05-07 | Discharge: 2013-05-07 | Disposition: A | Discharge status: Home / Self Care | Payer: Medicaid Other | Source: Ambulatory Visit | Attending: Radiation Oncology | Admitting: Radiation Oncology

## 2013-05-07 ENCOUNTER — Ambulatory Visit: Payer: Medicaid Other

## 2013-05-07 DIAGNOSIS — C773 Secondary and unspecified malignant neoplasm of axilla and upper limb lymph nodes: Principal | ICD-10-CM

## 2013-05-07 DIAGNOSIS — C50919 Malignant neoplasm of unspecified site of unspecified female breast: Secondary | ICD-10-CM

## 2013-05-07 NOTE — Progress Notes (Signed)
Weekly Management Note Current Dose: 30.6 Gy  Projected Dose: 60.4-66.4 Gy   Narrative:  The patient presents for routine under treatment assessment.  CBCT/MVCT images/Port film x-rays were reviewed.  The chart was checked. Doing well. No soreness. Taking xeloda.   Physical Findings: Slightly dark left chest wall. No evidence of progressive disease.   Impression:  The patient is tolerating radiation.  Plan:  Continue treatment as planned. Continue xeloda. Continue radiaplex.

## 2013-05-08 ENCOUNTER — Ambulatory Visit
Admission: RE | Admit: 2013-05-08 | Discharge: 2013-05-08 | Disposition: A | Discharge status: Home / Self Care | Payer: Medicaid Other | Source: Ambulatory Visit | Attending: Radiation Oncology | Admitting: Radiation Oncology

## 2013-05-09 ENCOUNTER — Encounter: Payer: Self-pay | Admitting: Radiation Oncology

## 2013-05-09 ENCOUNTER — Ambulatory Visit
Admission: RE | Admit: 2013-05-09 | Discharge: 2013-05-09 | Disposition: A | Discharge status: Home / Self Care | Payer: Medicaid Other | Source: Ambulatory Visit | Attending: Radiation Oncology | Admitting: Radiation Oncology

## 2013-05-09 NOTE — Progress Notes (Signed)
Name: AFNAN CADIENTE   MRN: [id number]  Date:  05/09/2013   DOB: [birth year]  Status:outpatient    DIAGNOSIS: Breast cancer.  CONSENT VERIFIED: yes   SET UP: Patient is setup supine   IMMOBILIZATION:  The following immobilization was used:Custom Moldable Pillow, breast board.   NARRATIVE: Lendora R Hipp underwent complex simulation and treatment planning for her boost treatment today.  Her tumor volume was outlined on the planning CT scan and measurement to the chest wall taken. The depth of her cavity was felt to be appropriate for treatment with electrons    6  MeV electrons will be prescribed to the 90% isodose line in 2 abutting fields.   I personally oversaw and approved the construction of 2 unique blocks which will be used for beam modification purposes.  A special port plan is requested.

## 2013-05-10 ENCOUNTER — Ambulatory Visit
Admission: RE | Admit: 2013-05-10 | Discharge: 2013-05-10 | Disposition: A | Discharge status: Home / Self Care | Payer: Medicaid Other | Source: Ambulatory Visit | Attending: Radiation Oncology | Admitting: Radiation Oncology

## 2013-05-13 ENCOUNTER — Ambulatory Visit
Admission: RE | Admit: 2013-05-13 | Discharge: 2013-05-13 | Disposition: A | Discharge status: Home / Self Care | Payer: Medicaid Other | Source: Ambulatory Visit | Attending: Radiation Oncology | Admitting: Radiation Oncology

## 2013-05-13 ENCOUNTER — Ambulatory Visit: Payer: Medicaid Other

## 2013-05-14 ENCOUNTER — Ambulatory Visit
Admission: RE | Admit: 2013-05-14 | Discharge: 2013-05-14 | Disposition: A | Discharge status: Home / Self Care | Payer: Medicaid Other | Source: Ambulatory Visit | Attending: Radiation Oncology | Admitting: Radiation Oncology

## 2013-05-14 ENCOUNTER — Encounter: Payer: Self-pay | Admitting: Radiation Oncology

## 2013-05-14 VITALS — BP 107/62 | HR 79 | Temp 98.4°F | Resp 20 | Wt 167.0 lb

## 2013-05-14 DIAGNOSIS — C50919 Malignant neoplasm of unspecified site of unspecified female breast: Secondary | ICD-10-CM

## 2013-05-14 DIAGNOSIS — C773 Secondary and unspecified malignant neoplasm of axilla and upper limb lymph nodes: Principal | ICD-10-CM

## 2013-05-14 NOTE — Progress Notes (Signed)
  Radiation Oncology         [phone] ________________________________  Name: Alyssa May MRN: [id number]  Date: 05/14/2013  DOB: March 04, 1966  Weekly Radiation Therapy Management  Current Dose: 39.6 Gy     Planned Dose:  ~ 66.4 Gy  Narrative . . . . . . . . The patient presents for routine under treatment assessment.                                   The patient is without complaint except for occasional discomfort along the left chest and axillary region. She has not required any pain medication for this issue.                                 Set-up films were reviewed.                                 The chart was checked. Physical Findings. . .  weight is 167 lb (75.751 kg). Her oral temperature is 98.4 F (36.9 C). Her blood pressure is 107/62 and her pulse is 79. Her respiration is 20. . Weight essentially stable.  Hyperpigmentation changes along the left chest and axillary region. No moist desquamation Impression . . . . . . . The patient is tolerating radiation. Plan . . . . . . . . . . . . Continue treatment as planned.  ________________________________  -----------------------------------  Blair Promise, PhD, MD

## 2013-05-14 NOTE — Progress Notes (Addendum)
Weekly rad tx, left chest wall, dark  hyperpigmentation, using radiaplex gel bid, stilltaking Xeloda 2x day good spirits, some stinging, some fatigue 10:45 AM

## 2013-05-15 ENCOUNTER — Ambulatory Visit
Admission: RE | Admit: 2013-05-15 | Discharge: 2013-05-15 | Disposition: A | Discharge status: Home / Self Care | Payer: Medicaid Other | Source: Ambulatory Visit | Attending: Radiation Oncology | Admitting: Radiation Oncology

## 2013-05-16 ENCOUNTER — Ambulatory Visit
Admission: RE | Admit: 2013-05-16 | Discharge: 2013-05-16 | Disposition: A | Discharge status: Home / Self Care | Payer: Medicaid Other | Source: Ambulatory Visit | Attending: Radiation Oncology | Admitting: Radiation Oncology

## 2013-05-17 ENCOUNTER — Telehealth: Payer: Self-pay | Admitting: *Deleted

## 2013-05-17 ENCOUNTER — Ambulatory Visit
Admission: RE | Admit: 2013-05-17 | Discharge: 2013-05-17 | Disposition: A | Discharge status: Home / Self Care | Payer: Medicaid Other | Source: Ambulatory Visit | Attending: Radiation Oncology | Admitting: Radiation Oncology

## 2013-05-17 NOTE — Telephone Encounter (Signed)
Called and spent approx 10minutes on phone for appeal on MRI-Abdomen denial. Presented more clinical information verbally as well as faxed to [phone]. Case # [id number]. Returning to Care Management to await reconsideration.

## 2013-05-20 ENCOUNTER — Ambulatory Visit: Payer: Medicaid Other

## 2013-05-20 ENCOUNTER — Ambulatory Visit
Admission: RE | Admit: 2013-05-20 | Discharge: 2013-05-20 | Disposition: A | Discharge status: Home / Self Care | Payer: Medicaid Other | Source: Ambulatory Visit | Attending: Radiation Oncology | Admitting: Radiation Oncology

## 2013-05-21 ENCOUNTER — Ambulatory Visit
Admission: RE | Admit: 2013-05-21 | Discharge: 2013-05-21 | Disposition: A | Discharge status: Home / Self Care | Payer: Medicaid Other | Source: Ambulatory Visit | Attending: Radiation Oncology | Admitting: Radiation Oncology

## 2013-05-21 ENCOUNTER — Ambulatory Visit: Payer: Medicaid Other

## 2013-05-21 VITALS — BP 115/58 | HR 79 | Temp 98.1°F | Wt 166.8 lb

## 2013-05-21 DIAGNOSIS — C50412 Malignant neoplasm of upper-outer quadrant of left female breast: Secondary | ICD-10-CM

## 2013-05-21 MED ORDER — BIAFINE EX EMUL
CUTANEOUS | Status: DC | PRN
  Administered 2013-05-21: 12:00:00 via TOPICAL

## 2013-05-21 NOTE — Progress Notes (Signed)
Weekly assessment of radiation to left chest wall.skin is markedly discolored without peeling.Burning sensation at times.Will give biafine to apply to skin 2 to 3 times a day instead of radiaplex.Mild fatigue.

## 2013-05-21 NOTE — Progress Notes (Signed)
Weekly Management Note Current Dose:  48.6 Gy  Projected Dose: 66.4 Gy   Narrative:  The patient presents for routine under treatment assessment.  CBCT/MVCT images/Port film x-rays were reviewed.  The chart was checked. Irritation along left chest wall. Scans next week. No complaints. "hoping the cancer is gone and I won't have to have any more treatments"  Physical Findings: Weight: 166 lb 12.8 oz (75.66 kg). Dry desquamation over left chest wall. Skin intact.  Impression:  The patient is tolerating radiation.  Plan:  Continue treatment as planned. Continue biafene.  We discussed metastatic breast cancer as a chronic disease and the unrealistic expectation that she would be "cured" especially given her fast recurrence on the chest wall after surgery.  She prefers to think about it another way. We also discussed that she likely would not be a candidate for implants.

## 2013-05-22 ENCOUNTER — Ambulatory Visit: Payer: Medicaid Other

## 2013-05-22 ENCOUNTER — Ambulatory Visit
Admission: RE | Admit: 2013-05-22 | Discharge: 2013-05-22 | Disposition: A | Discharge status: Home / Self Care | Payer: Medicaid Other | Source: Ambulatory Visit | Attending: Radiation Oncology | Admitting: Radiation Oncology

## 2013-05-23 ENCOUNTER — Encounter: Payer: Self-pay | Admitting: *Deleted

## 2013-05-23 ENCOUNTER — Ambulatory Visit: Payer: Medicaid Other

## 2013-05-23 ENCOUNTER — Ambulatory Visit
Admission: RE | Admit: 2013-05-23 | Discharge: 2013-05-23 | Disposition: A | Discharge status: Home / Self Care | Payer: Medicaid Other | Source: Ambulatory Visit | Attending: Radiation Oncology | Admitting: Radiation Oncology

## 2013-05-23 ENCOUNTER — Telehealth: Payer: Self-pay | Admitting: *Deleted

## 2013-05-23 NOTE — Telephone Encounter (Signed)
Left message for pt to return my call so I can reschedule her lab appt.

## 2013-05-23 NOTE — Progress Notes (Signed)
[place] Work  Clinical Social Work was referred by patient for assessment of psychosocial needs.  Clinical Social Worker met with patient at [facility] in [place] office to offer support and assess for needs.  Pt explained ongoing issues with finances due to her ongoing treatment plan and husband's unemployment. Pt has accessed resources at [facility] in [facility] and [facility]. She also reports to have family that have helped in the past. She has begun to gather needed paperwork to apply for disability, but reports to be focused on assisting her husband find employment currently. CSW discussed with pt how this resource may be beneficial as well.   CSW was able to find one additional resource that may help pt and is assisting on getting application completed. Pt will meet with CSW again tomorrow to discuss further.    Clinical Social Work interventions: Supportive Audiological scientist and referral  Loren Racer, LCSW Clinical Social Worker [facility] for [place] Wednesday, Thursday and Friday Phone: [phone] Fax: [phone]

## 2013-05-24 ENCOUNTER — Ambulatory Visit
Admission: RE | Admit: 2013-05-24 | Discharge: 2013-05-24 | Disposition: A | Discharge status: Home / Self Care | Payer: Medicaid Other | Source: Ambulatory Visit | Attending: Radiation Oncology | Admitting: Radiation Oncology

## 2013-05-24 ENCOUNTER — Ambulatory Visit: Payer: Medicaid Other

## 2013-05-27 ENCOUNTER — Other Ambulatory Visit: Payer: Self-pay

## 2013-05-27 ENCOUNTER — Ambulatory Visit
Admission: RE | Admit: 2013-05-27 | Discharge: 2013-05-27 | Disposition: A | Discharge status: Home / Self Care | Payer: Medicaid Other | Source: Ambulatory Visit | Attending: Radiation Oncology | Admitting: Radiation Oncology

## 2013-05-27 ENCOUNTER — Ambulatory Visit: Payer: Self-pay | Admitting: Oncology

## 2013-05-28 ENCOUNTER — Ambulatory Visit
Admission: RE | Admit: 2013-05-28 | Discharge: 2013-05-28 | Disposition: A | Discharge status: Home / Self Care | Payer: Medicaid Other | Source: Ambulatory Visit | Attending: Radiation Oncology | Admitting: Radiation Oncology

## 2013-05-28 ENCOUNTER — Ambulatory Visit: Payer: Medicaid Other

## 2013-05-28 VITALS — BP 131/94 | HR 85 | Temp 98.7°F | Wt 165.0 lb

## 2013-05-28 DIAGNOSIS — C773 Secondary and unspecified malignant neoplasm of axilla and upper limb lymph nodes: Principal | ICD-10-CM

## 2013-05-28 DIAGNOSIS — C50919 Malignant neoplasm of unspecified site of unspecified female breast: Secondary | ICD-10-CM

## 2013-05-28 MED ORDER — BIAFINE EX EMUL
CUTANEOUS | Status: DC | PRN
  Administered 2013-05-28: 18:00:00 via TOPICAL

## 2013-05-28 NOTE — Addendum Note (Signed)
Encounter addended by: Arlyss Repress, RN on: 05/28/2013  5:41 PM<BR>     Documentation filed: Inpatient MAR

## 2013-05-28 NOTE — Progress Notes (Signed)
Weekly Management Note Current Dose: 58.4  Gy  Projected Dose: 66.4 Gy   Narrative:  The patient presents for routine under treatment assessment.  CBCT/MVCT images/Port film x-rays were reviewed.  The chart was checked. Skin breakdown under arm. Some pulling/tightness of her left arm. MRI tomorrow of liver per [facility].   Physical Findings: Weight: 165 lb (74.844 kg). Dry desquamation over chest. AReas of moist desquamation underarm.  Impression:  The patient is tolerating radiation.  Plan:  Continue treatment as planned. Continue radiaplex. Start neosporin under arm. Discussed gentle stretching. Follow up in 1 month.

## 2013-05-28 NOTE — Progress Notes (Addendum)
Patient for weekly assessment of left chest wall radiation.Now has 4 of 8 of boost remaining.Has moist desquamation of left axilla.Told to apply neosporin 2 to 3 times daily to this area and cover with telfa and mesh brief.contineu to apply biafine to remaining treatment field.Had some "tumor pain" over the week-end.Scheduled for an mrof chest/abdomen and pelvis tomorrow per Dr.Magrinat.

## 2013-05-28 NOTE — Addendum Note (Signed)
Encounter addended by: Arlyss Repress, RN on: 05/28/2013  5:39 PM<BR>     Documentation filed: Orders

## 2013-05-29 ENCOUNTER — Ambulatory Visit: Payer: Medicaid Other

## 2013-05-29 ENCOUNTER — Ambulatory Visit
Admission: RE | Admit: 2013-05-29 | Discharge: 2013-05-29 | Disposition: A | Discharge status: Home / Self Care | Payer: Medicaid Other | Source: Ambulatory Visit | Attending: Radiation Oncology | Admitting: Radiation Oncology

## 2013-05-29 ENCOUNTER — Other Ambulatory Visit: Payer: Self-pay | Admitting: Oncology

## 2013-05-29 ENCOUNTER — Ambulatory Visit (HOSPITAL_COMMUNITY)
Admission: RE | Admit: 2013-05-29 | Discharge: 2013-05-29 | Disposition: A | Discharge status: Home / Self Care | Payer: Medicaid Other | Source: Ambulatory Visit | Attending: Oncology | Admitting: Oncology

## 2013-05-29 DIAGNOSIS — C787 Secondary malignant neoplasm of liver and intrahepatic bile duct: Secondary | ICD-10-CM | POA: Insufficient documentation

## 2013-05-29 DIAGNOSIS — C50919 Malignant neoplasm of unspecified site of unspecified female breast: Secondary | ICD-10-CM

## 2013-05-29 MED ORDER — GADOBENATE DIMEGLUMINE 529 MG/ML IV SOLN
15.0000 mL | Freq: Once | INTRAVENOUS | Status: AC | PRN
  Administered 2013-05-29: 15 mL via INTRAVENOUS

## 2013-05-30 ENCOUNTER — Ambulatory Visit
Admission: RE | Admit: 2013-05-30 | Discharge: 2013-05-30 | Disposition: A | Discharge status: Home / Self Care | Payer: Medicaid Other | Source: Ambulatory Visit | Attending: Radiation Oncology | Admitting: Radiation Oncology

## 2013-05-30 ENCOUNTER — Ambulatory Visit: Payer: Medicaid Other

## 2013-05-31 ENCOUNTER — Ambulatory Visit: Payer: Medicaid Other

## 2013-05-31 ENCOUNTER — Ambulatory Visit
Admission: RE | Admit: 2013-05-31 | Discharge: 2013-05-31 | Disposition: A | Discharge status: Home / Self Care | Payer: Medicaid Other | Source: Ambulatory Visit | Attending: Radiation Oncology | Admitting: Radiation Oncology

## 2013-06-03 ENCOUNTER — Ambulatory Visit
Admission: RE | Admit: 2013-06-03 | Discharge: 2013-06-03 | Disposition: A | Discharge status: Home / Self Care | Payer: Medicaid Other | Source: Ambulatory Visit | Attending: Radiation Oncology | Admitting: Radiation Oncology

## 2013-06-03 ENCOUNTER — Ambulatory Visit: Payer: Medicaid Other

## 2013-06-03 ENCOUNTER — Encounter: Payer: Self-pay | Admitting: Radiation Oncology

## 2013-06-04 ENCOUNTER — Ambulatory Visit: Payer: Medicaid Other

## 2013-06-04 ENCOUNTER — Ambulatory Visit: Payer: Medicaid Other | Admitting: Radiation Oncology

## 2013-06-05 ENCOUNTER — Ambulatory Visit (HOSPITAL_BASED_OUTPATIENT_CLINIC_OR_DEPARTMENT_OTHER): Payer: Medicaid Other | Admitting: Oncology

## 2013-06-05 ENCOUNTER — Ambulatory Visit: Payer: Medicaid Other

## 2013-06-05 VITALS — BP 110/73 | HR 94 | Temp 98.4°F | Resp 18 | Wt 169.8 lb

## 2013-06-05 DIAGNOSIS — D6481 Anemia due to antineoplastic chemotherapy: Secondary | ICD-10-CM

## 2013-06-05 DIAGNOSIS — T451X5A Adverse effect of antineoplastic and immunosuppressive drugs, initial encounter: Secondary | ICD-10-CM

## 2013-06-05 DIAGNOSIS — C50412 Malignant neoplasm of upper-outer quadrant of left female breast: Secondary | ICD-10-CM

## 2013-06-05 DIAGNOSIS — R5381 Other malaise: Secondary | ICD-10-CM

## 2013-06-05 DIAGNOSIS — C50419 Malignant neoplasm of upper-outer quadrant of unspecified female breast: Secondary | ICD-10-CM

## 2013-06-05 DIAGNOSIS — R5383 Other fatigue: Secondary | ICD-10-CM

## 2013-06-05 DIAGNOSIS — C50919 Malignant neoplasm of unspecified site of unspecified female breast: Secondary | ICD-10-CM

## 2013-06-05 DIAGNOSIS — Z171 Estrogen receptor negative status [ER-]: Secondary | ICD-10-CM

## 2013-06-05 DIAGNOSIS — C787 Secondary malignant neoplasm of liver and intrahepatic bile duct: Secondary | ICD-10-CM

## 2013-06-05 DIAGNOSIS — C773 Secondary and unspecified malignant neoplasm of axilla and upper limb lymph nodes: Secondary | ICD-10-CM

## 2013-06-05 NOTE — Progress Notes (Signed)
[facility]  [phone] [phone]  OFFICE PROGRESS NOTE     ID: Carlan Holley Brallier   DOB: May 11, 1965  MR#: [id number]  [id number]   [id number] NOT IN SYSTEM SU: Donnie Mesa, MD RAD ONC: Eppie Gibson, MD; Thea Silversmith M.D.    HISTORY OF PRESENT ILLNESS: Alyssa May is a 47 y.o. [place] woman who first noted a small mass in her left breast 01/2010, shortly after she stopped breast-feeding.  It did not disappear with resumption of her periods and grew over the next year, and particularly since she had her miscarrriage in 12/2010. This brought her to the Emergency Room 03/09/2011 where she was found to have a fluctuant area in the UOQ of the left breast measuring 8.4 cm. The tissue beneath the cystic area extending towards the axilla and medially was described as firm, but not erythematous. The cystic area was lanced and a large amount of serosanguinos material was expressed. The patient was referred to Dr. Verita Lamb who saw her 03/11/2011. By this time the fluid had largely reaccumulated. He incised the lesion, again draining serosanguinous liquid, packed it, and set the patient up for left breast US at the [facility] 03/14/2011. This showed an irregularly marginated mass measuring 7.3 cm, with abnormal axillary adenopathy. On 01/1102013 bilateral mammography confirmed a high-density mass in the left UOQ. This was biopsied, as was a left axillary lymph node. Both biopsies [id number] showed invasive ductal carcinoma, high grade, triple negative, with an Mib-1 of 97%.  Her subsequent history is as detailed below.  INTERVAL HISTORY: Alyssa May returns today for followup of her breast cancer. She completed her radiation treatments 06/03/2013. She took capecitabine for sensitization. She had significant wet desquamation and has some restricted range of motion in the left upper extremity. She is not aware of any side effects from the capecitabine and  specifically hand-foot syndrome and diarrhea were not a problem for her  REVIEW OF SYSTEMS: Alyssa May does feel fatigued and tells me she has not been able to clean her house. She is planning a trip to [place] for the holidays, then she will be back for some time, then back to [place], and so on. She denies unusual headaches, visual changes, nausea, vomiting, cough, phlegm production, pleurisy, or change in bowel or bladder habits. A detailed review of systems today was stable except as noted  PAST MEDICAL HISTORY: Past Medical History  Diagnosis Date  . Breast abscess 03/2011  . History of bronchitis     last time OCt-Nov 2012  . Seizures     as a toddler ,but none since then.  . Joint pain   . History of blood transfusion     in May 2013-no abnormal reaction noted  . Cancer     Left Breast  . Breast cancer     left    PAST SURGICAL HISTORY: Past Surgical History  Procedure Laterality Date  . Dilation and curettage of uterus  01/04/11  . Breast mass excision  2013  . Tonsillectomy    . Portacath placement  04/12/2011    Procedure: INSERTION PORT-A-CATH;  Surgeon: Imogene Burn. Tsuei, MD;  Location: [facility];  Service: General;  Laterality: Right;  right subclavian  . Incision and drainage breast abscess  03/31/11  . Mastectomy modified radical Left 02/27/2013    Procedure: MASTECTOMY MODIFIED RADICAL;  Surgeon: Haywood Lasso, MD;  Location: [facility];  Service: General;  Laterality: Left;  . Breast surgery  FAMILY HISTORY Family History  Problem Relation Age of Onset  . Diabetes Mother   . Hypertension Mother   [name] died from post-operative complications at age 12; the patient's mother is 59, lives in [place] The patient is an only child.   GYNECOLOGIC HISTORY: First pregnancy to term age 41. Her menstrual cycles were interrupted with chemotherapy, but resumed in 05/2012.  She has not had a menses since her menstrual cycle in 05/2012.   SOCIAL  HISTORY: She is a Research scientist (medical), living in [place] but originally from the [place] area. She has a good deal of family in this area. Her husband [name] is a Geophysicist/field seismologist for a sedan service in [place] he works there 15 days, and then returns home for another 15 days. Currently he is considering a similar job in [place], [place]. Their daughter, [name], is being home schooled.      ADVANCED DIRECTIVES: Not in place   HEALTH MAINTENANCE: History  Substance Use Topics  . Smoking status: Never Smoker   . Smokeless tobacco: Never Used  . Alcohol Use: Yes     Comment: occasionally wine     Colonoscopy:  N/A  PAP: 03/18/2011  Bone density: N/A  Lipid panel: Not on file   Allergies  Allergen Reactions  . Sulfa Antibiotics Swelling    Current Outpatient Prescriptions  Medication Sig Dispense Refill  . capecitabine (XELODA) 500 MG tablet Take 2 tablets (1,000 mg total) by mouth 2 (two) times daily after a meal.  124 tablet  0  . cholecalciferol (VITAMIN D) 1000 UNITS tablet Take 2,000 Units by mouth every morning.      . ferrous sulfate 325 (65 FE) MG tablet Take 325 mg by mouth daily with breakfast.      . hyaluronate sodium (RADIAPLEXRX) GEL Apply 1 application topically 2 (two) times daily.      [name] HYDROcodone-acetaminophen (NORCO/VICODIN) 5-325 MG per tablet Take 1-2 tablets by mouth every 6 (six) hours as needed for moderate pain (pain).      . LORazepam (ATIVAN) 0.5 MG tablet Take 0.5 mg by mouth every 8 (eight) hours as needed.       . ondansetron (ZOFRAN) 8 MG tablet Take 1 tablet (8 mg total) by mouth every 8 (eight) hours as needed for nausea.  30 tablet  1   No current facility-administered medications for this visit.   Objective: Middle-aged [place] American woman who appears stated age  32 Vitals:   06/05/13 1702  BP: 110/73  Pulse: 94  Temp: 98.4 F (36.9 C)  Resp: 18     Body mass index is 28.26 kg/(m^2).    ECOG FS: 1 - Symptomatic but completely  ambulatory   Filed Weights   06/05/13 1702  Weight: 169 lb 12.8 oz (77.021 kg)    Sclerae unicteric, EOMs intact Oropharynx clear and moist, no thrush or other lesions No cervical or supraclavicular adenopathy Lungs no rales or rhonchi Heart regular rate and rhythm Abd soft, nontender, positive bowel sounds MSK no focal spinal tenderness Neuro: nonfocal, well oriented, positive affect Breasts: The right breast is unremarkable. The left breast is status post modified radical mastectomy and radiation. There is significant wet desquamation in the axilla and part of the anterior chest. There is significant hyperpigmentation. There are no skin nodules or ulcers. I do not palpate any masses in her left axilla   LAB RESULTS: Lab Results  Component Value Date   WBC 3.4* 04/16/2013   NEUTROABS  1.8 04/16/2013   HGB 10.5* 04/16/2013   HCT 32.8* 04/16/2013   MCV 83.8 04/16/2013   PLT 286 04/16/2013       Chemistry      Component Value Date/Time   NA 141 04/16/2013 1611   NA 136 03/01/2013 0423   K 4.3 04/16/2013 1611   K 4.0 03/01/2013 0423   CL 100 03/01/2013 0423   CL 103 07/05/2012 1448   CO2 30* 04/16/2013 1611   CO2 29 03/01/2013 0423   BUN 12.1 04/16/2013 1611   BUN 3* 03/01/2013 0423   CREATININE 0.8 04/16/2013 1611   CREATININE 0.74 03/01/2013 0423      Component Value Date/Time   CALCIUM 9.5 04/16/2013 1611   CALCIUM 7.4* 03/01/2013 0423   ALKPHOS 64 04/16/2013 1611   ALKPHOS 68 02/26/2013 1701   AST 20 04/16/2013 1611   AST 26 02/26/2013 1701   ALT 10 04/16/2013 1611   ALT 10 02/26/2013 1701   BILITOT 0.20 04/16/2013 1611   BILITOT 0.2* 02/26/2013 1701       STUDIES: Mr Abdomen W Wo Contrast  05/29/2013   CLINICAL DATA:  Breast cancer. History of left liver metastatic disease  EXAM: MRI ABDOMEN WITHOUT AND WITH CONTRAST  TECHNIQUE: Multiplanar multisequence MR imaging of the abdomen was performed both before and after the administration of intravenous contrast.  CONTRAST:   12mL MULTIHANCE GADOBENATE DIMEGLUMINE 529 MG/ML IV SOLN  COMPARISON:  Abdominal MRI from 04/24/2012.  FINDINGS: The previously described lesion in the anterior aspect of the lateral segment left liver measures 3 x 3 mm today and is barely discernible along the anterior surface of the liver. No new liver abnormality is identified.  The spleen, stomach, duodenum, pancreas, and gallbladder, adrenal glands, and kidneys are unremarkable.  There is no abdominal aortic aneurysm. No free fluid or lymphadenopathy in the abdomen.  No abnormal marrow signal within the visualized bony structures.  IMPRESSION: The subtle anterior left hepatic lobe lesion seen on the previous study is almost inapparent today with only a tiny 3 mm region of abnormal signal in this area today. There is capsular retraction in this region and no enhancement suggesting that it may represent scar.  No new or progressive disease in the abdomen.   Electronically Signed   By: Misty Stanley M.D.   On: 05/29/2013 12:43     ASSESSMENT: Alyssa May is a 48 y.o.  [place], [place] woman with stage IV breast cancer (liver involvement at presentation but no bone, lung or brain spread):  (1)  Status post upper outer quadrant left breast biopsy 03/18/2011 showing a clinical T4,N1 invasive ductal carcinoma, high grade, triple negative, with an Mib-1 of 97%, with a single liver metastasis pathologically confirmed 03/31/2011.  (2) Status post 4 cycles of Q three-week docetaxel/ doxorubicin/ cyclophosphamide in the neoadjuvant setting, discontinued after 4 cycles due to peripheral neuropathy.  Status post 2 cycles of every 3 week carboplatin/ gemcitabine, for a total of 6 neoadjuvant cycles altogether, completed 11/09/2011.  (3) The patient refused surgery.  (4) With subsequent tumor re-growth, started Carboplatin/ Gemcitabine on 09/10/2012, with treatment on days 1 and 8 of each 21 day cycle, received six cycles, last dose 01/28/2013; response  was moderate  (5) status post left modified radical mastectomy 02/27/2013 for a residual pT4b pN2a invasive ductal carcinoma, repeat prognostic panel again triple negative, with an MIB-1 of 90%.  (6) recurrence at left mastectomy wound excised 04/02/2013  (6) radiation therapy to left chest wall and  regional drainage nodes started 04/10/2013 with capecitabine sensitization (1000 mg po BID on treatment days), completed 06/03/2013  (7) the patient has not decided whether she will eventually want reconstruction  (8) restaging liver MRI shows only a 3 mm lesion consistent with scar   PLAN:  Alyssa May appears to be in something close to a remission at this point. She has a few capecitabine tablets and she will finish those. My plan originally was to send her to Dr. Cloyd Stagers at [facility] for liver resection, but given the MRI results just obtained I think a better plan is to "wait and see". If we can find a study that attempts to prolong remission in triple negative patients, she would be an excellent candidate.  She is expressing some interest in reconstruction. This may need to be done in a tertiary care center. In any case she knows just wait at least 6 months from completion of radiation before considering that further  Alyssa May is very pleased with this observational approach. Of course she will continue to try alternative therapies and prayer. We're going to follow her very closely, with a CT of the chest and repeat liver MRI between 2 and 3 months from now. She is going to be traveling back and forth to [place] but she has my card and she will let me know if she develops any symptoms of concern. Otherwise she will return to see me early June.     Chauncey Cruel, MD   06/05/2013 5:16 PM

## 2013-06-06 ENCOUNTER — Telehealth: Payer: Self-pay | Admitting: Oncology

## 2013-06-06 NOTE — Telephone Encounter (Signed)
per pof to make lab only appt in 6 wks and labs(7/2) 1 wk before visit( on 7/9 @4 ) per GM. Cld pt to adv of times & date of appts.

## 2013-06-07 ENCOUNTER — Encounter: Payer: Self-pay | Admitting: *Deleted

## 2013-06-07 NOTE — Progress Notes (Signed)
Downloaded pic, printed and took to Tiffany in [facility] to scan.  

## 2013-06-10 NOTE — Progress Notes (Signed)
  Radiation Oncology         [phone] ________________________________  Name: Alyssa May MRN: [id number]  Date: 06/03/2013  DOB: May 12, 1965  End of Treatment Note  Diagnosis:   Stage IV breast cancer with chest wall recurrence     Indication for treatment:  Palliative       Radiation treatment dates:  04/10/2013-06/03/2013  Site treated and dose: Left chest wall / 50.4 Gray @ 1.8 Pearline Cables per fraction x 28 fractions Left Supraclavicular fossa / 50.4 Gray @1 .8 Pearline Cables per fraction x 28 fractions Left PAB / 50.4 Gy at 1.8 Gray per fraction x 28 fractions Left scar / 16 Gray at [employer] per fraction x 5 fractions  Beams/Energies: Opposed tangents with reduced fields/ 6 MV photons RAO / 6 MV photons LPA / 6 MV photons En face with 6 MeV electrons  Narrative: The patient tolerated radiation treatment relatively well.   She struggled with compliance but was ultimately able to complete her treatment. She had hyperpigmentation over her chest wall but no moist desquamation.   Plan: The patient has completed radiation treatment. The patient will return to radiation oncology clinic for routine followup in one month. I advised them to call or return sooner if they have any questions or concerns related to their recovery or treatment.  ------------------------------------------------  Thea Silversmith, MD

## 2013-06-21 NOTE — Addendum Note (Signed)
Encounter addended by: Thea Silversmith, MD on: 06/21/2013 10:20 AM<BR>     Documentation filed: Notes Section

## 2013-06-24 ENCOUNTER — Other Ambulatory Visit: Payer: Self-pay

## 2013-06-24 ENCOUNTER — Ambulatory Visit (HOSPITAL_BASED_OUTPATIENT_CLINIC_OR_DEPARTMENT_OTHER): Payer: Medicaid Other

## 2013-06-24 VITALS — BP 100/61 | HR 80 | Temp 97.1°F | Resp 15

## 2013-06-24 DIAGNOSIS — C50419 Malignant neoplasm of upper-outer quadrant of unspecified female breast: Secondary | ICD-10-CM

## 2013-06-24 DIAGNOSIS — C787 Secondary malignant neoplasm of liver and intrahepatic bile duct: Secondary | ICD-10-CM

## 2013-06-24 DIAGNOSIS — Z452 Encounter for adjustment and management of vascular access device: Secondary | ICD-10-CM

## 2013-06-24 DIAGNOSIS — Z95828 Presence of other vascular implants and grafts: Secondary | ICD-10-CM

## 2013-06-24 DIAGNOSIS — C773 Secondary and unspecified malignant neoplasm of axilla and upper limb lymph nodes: Secondary | ICD-10-CM

## 2013-06-24 MED ORDER — HEPARIN SOD (PORK) LOCK FLUSH 100 UNIT/ML IV SOLN
500.0000 [IU] | Freq: Once | INTRAVENOUS | Status: AC
  Administered 2013-06-24: 500 [IU] via INTRAVENOUS
  Filled 2013-06-24: qty 5

## 2013-06-24 MED ORDER — SODIUM CHLORIDE 0.9 % IJ SOLN
10.0000 mL | INTRAMUSCULAR | Status: DC | PRN
  Administered 2013-06-24: 10 mL via INTRAVENOUS
  Filled 2013-06-24: qty 10

## 2013-06-24 NOTE — Patient Instructions (Signed)

## 2013-07-02 ENCOUNTER — Encounter: Payer: Self-pay | Admitting: Radiation Oncology

## 2013-07-02 ENCOUNTER — Telehealth: Payer: Self-pay | Admitting: Oncology

## 2013-07-02 NOTE — Telephone Encounter (Signed)
, °

## 2013-07-04 ENCOUNTER — Ambulatory Visit
Admission: RE | Admit: 2013-07-04 | Discharge: 2013-07-04 | Disposition: A | Discharge status: Home / Self Care | Payer: Medicaid Other | Source: Ambulatory Visit | Attending: Radiation Oncology | Admitting: Radiation Oncology

## 2013-07-04 VITALS — BP 107/63 | HR 83 | Temp 98.4°F | Wt 169.2 lb

## 2013-07-04 DIAGNOSIS — C50412 Malignant neoplasm of upper-outer quadrant of left female breast: Secondary | ICD-10-CM

## 2013-07-04 HISTORY — DX: Personal history of irradiation: Z92.3

## 2013-07-04 NOTE — Progress Notes (Signed)
One month follow up completion of radiation to left chest wall.Skin with continued darkening but no peeling. Continue application of radiaplex.Improvement in fatigue.

## 2013-07-04 NOTE — Progress Notes (Signed)
   Department of Radiation Oncology  Phone:  [phone] Fax:        [phone]   Name: Alyssa May MRN: [id number]  DOB: Sep 20, 1965  Date: 07/04/2013  Follow Up Visit Note  Diagnosis: Metastatic breast cancer  Summary and Interval since last radiation: s/p 66.4 Gy to her chest wall/scar completed 06/03/13  Interval History: [name] presents today for routine followup.  She completed radiation about a month ago. She is feeling well. She has some pain on her right side at bedtime. She's noticed an improvement in her fatigue as well as her skin darkening. She had recent imaging studies with Dr. Jana Hakim in late March. This showed a basically scar tissue within the liver. Followup studies are scheduled in July. She is not on active treatment. She is using castor oil and frank in sense on her scar.  Allergies:  Allergies  Allergen Reactions  . Sulfa Antibiotics Swelling    Medications:  Current Outpatient Prescriptions  Medication Sig Dispense Refill  . cholecalciferol (VITAMIN D) 1000 UNITS tablet Take 2,000 Units by mouth every morning.      . ferrous sulfate 325 (65 FE) MG tablet Take 325 mg by mouth daily with breakfast.      . hyaluronate sodium (RADIAPLEXRX) GEL Apply 1 application topically 2 (two) times daily.      [name] HYDROcodone-acetaminophen (NORCO/VICODIN) 5-325 MG per tablet Take 1-2 tablets by mouth every 6 (six) hours as needed for moderate pain (pain).      . capecitabine (XELODA) 500 MG tablet Take 2 tablets (1,000 mg total) by mouth 2 (two) times daily after a meal.  124 tablet  0  . LORazepam (ATIVAN) 0.5 MG tablet Take 0.5 mg by mouth every 8 (eight) hours as needed.       . ondansetron (ZOFRAN) 8 MG tablet Take 1 tablet (8 mg total) by mouth every 8 (eight) hours as needed for nausea.  30 tablet  1   No current facility-administered medications for this encounter.    Physical Exam:  Filed Vitals:   07/04/13 1549  BP: 107/63  Pulse: 83  Temp: 98.4 F  (36.9 C)  Weight: 169 lb 3.2 oz (76.749 kg)  SpO2: 100%   she has hyperpigmentation over the left chest wall. This is worse medially. She has some scar tissue present in the area of recurrence. There is nothing tender to palpation.  IMPRESSION: [name] is a 48 y.o. female status post radiation to left chest wall for a left chest wall recurrence with resolving acute effects of treatment  PLAN:  Jolana looks great. We discussed some protection in the treated area. I encouraged her to use vitamin E in these areas. She has followup scheduled in July. I encouraged her to contact me if any questions. We again went over that she would not be a candidate for reconstruction until least 6 months after treatment if at all. I will plan on seeing her back on an as-needed basis.    Thea Silversmith, MD

## 2013-07-17 ENCOUNTER — Other Ambulatory Visit: Payer: Self-pay

## 2013-08-27 ENCOUNTER — Other Ambulatory Visit: Payer: Self-pay | Admitting: Physician Assistant

## 2013-08-27 DIAGNOSIS — C50412 Malignant neoplasm of upper-outer quadrant of left female breast: Secondary | ICD-10-CM

## 2013-09-05 ENCOUNTER — Other Ambulatory Visit (HOSPITAL_BASED_OUTPATIENT_CLINIC_OR_DEPARTMENT_OTHER): Payer: Medicaid Other

## 2013-09-05 ENCOUNTER — Ambulatory Visit (HOSPITAL_BASED_OUTPATIENT_CLINIC_OR_DEPARTMENT_OTHER): Payer: Medicaid Other

## 2013-09-05 VITALS — BP 101/57 | HR 87 | Temp 98.0°F | Resp 15

## 2013-09-05 DIAGNOSIS — C50412 Malignant neoplasm of upper-outer quadrant of left female breast: Secondary | ICD-10-CM

## 2013-09-05 DIAGNOSIS — C50419 Malignant neoplasm of upper-outer quadrant of unspecified female breast: Secondary | ICD-10-CM

## 2013-09-05 DIAGNOSIS — C787 Secondary malignant neoplasm of liver and intrahepatic bile duct: Secondary | ICD-10-CM

## 2013-09-05 DIAGNOSIS — Z452 Encounter for adjustment and management of vascular access device: Secondary | ICD-10-CM

## 2013-09-05 DIAGNOSIS — Z95828 Presence of other vascular implants and grafts: Secondary | ICD-10-CM

## 2013-09-05 LAB — COMPREHENSIVE METABOLIC PANEL (CC13)
ALBUMIN: 3.6 g/dL (ref 3.5–5.0)
ALT: 13 U/L (ref 0–55)
AST: 20 U/L (ref 5–34)
Alkaline Phosphatase: 62 U/L (ref 40–150)
Anion Gap: 9 mEq/L (ref 3–11)
BUN: 12.5 mg/dL (ref 7.0–26.0)
CALCIUM: 8.7 mg/dL (ref 8.4–10.4)
CHLORIDE: 105 meq/L (ref 98–109)
CO2: 26 mEq/L (ref 22–29)
Creatinine: 0.8 mg/dL (ref 0.6–1.1)
GLUCOSE: 106 mg/dL (ref 70–140)
POTASSIUM: 4.1 meq/L (ref 3.5–5.1)
SODIUM: 139 meq/L (ref 136–145)
TOTAL PROTEIN: 7.4 g/dL (ref 6.4–8.3)
Total Bilirubin: 0.39 mg/dL (ref 0.20–1.20)

## 2013-09-05 LAB — CBC WITH DIFFERENTIAL/PLATELET
BASO%: 0.4 % (ref 0.0–2.0)
Basophils Absolute: 0 10*3/uL (ref 0.0–0.1)
EOS ABS: 0.1 10*3/uL (ref 0.0–0.5)
EOS%: 3.2 % (ref 0.0–7.0)
HCT: 35.7 % (ref 34.8–46.6)
HEMOGLOBIN: 11.9 g/dL (ref 11.6–15.9)
LYMPH#: 0.6 10*3/uL — AB (ref 0.9–3.3)
LYMPH%: 22.4 % (ref 14.0–49.7)
MCH: 29.8 pg (ref 25.1–34.0)
MCHC: 33.3 g/dL (ref 31.5–36.0)
MCV: 89.5 fL (ref 79.5–101.0)
MONO#: 0.3 10*3/uL (ref 0.1–0.9)
MONO%: 10 % (ref 0.0–14.0)
NEUT%: 64 % (ref 38.4–76.8)
NEUTROS ABS: 1.8 10*3/uL (ref 1.5–6.5)
Platelets: 218 10*3/uL (ref 145–400)
RBC: 3.99 10*6/uL (ref 3.70–5.45)
RDW: 14.2 % (ref 11.2–14.5)
WBC: 2.8 10*3/uL — AB (ref 3.9–10.3)

## 2013-09-05 MED ORDER — HEPARIN SOD (PORK) LOCK FLUSH 100 UNIT/ML IV SOLN
500.0000 [IU] | Freq: Once | INTRAVENOUS | Status: AC
  Administered 2013-09-05: 500 [IU] via INTRAVENOUS
  Filled 2013-09-05: qty 5

## 2013-09-05 MED ORDER — SODIUM CHLORIDE 0.9 % IJ SOLN
10.0000 mL | INTRAMUSCULAR | Status: DC | PRN
  Administered 2013-09-05: 10 mL via INTRAVENOUS
  Filled 2013-09-05: qty 10

## 2013-09-05 NOTE — Patient Instructions (Signed)

## 2013-09-09 ENCOUNTER — Telehealth: Payer: Self-pay

## 2013-09-09 NOTE — Telephone Encounter (Signed)
Faxed dispensing order to [facility] for [id number] and [id number].  Sent to scan  

## 2013-09-10 ENCOUNTER — Ambulatory Visit (HOSPITAL_COMMUNITY): Payer: Medicaid Other

## 2013-09-10 ENCOUNTER — Ambulatory Visit (HOSPITAL_COMMUNITY): Admission: RE | Admit: 2013-09-10 | Payer: Medicaid Other | Source: Ambulatory Visit

## 2013-09-12 ENCOUNTER — Ambulatory Visit (HOSPITAL_COMMUNITY): Payer: Self-pay

## 2013-09-12 ENCOUNTER — Other Ambulatory Visit (HOSPITAL_COMMUNITY): Payer: Self-pay

## 2013-09-12 ENCOUNTER — Ambulatory Visit: Payer: Self-pay | Admitting: Oncology

## 2013-09-16 ENCOUNTER — Ambulatory Visit (HOSPITAL_COMMUNITY)
Admission: RE | Admit: 2013-09-16 | Discharge: 2013-09-16 | Disposition: A | Discharge status: Home / Self Care | Payer: Medicaid Other | Source: Ambulatory Visit | Attending: Oncology | Admitting: Oncology

## 2013-09-16 ENCOUNTER — Other Ambulatory Visit: Payer: Self-pay | Admitting: Oncology

## 2013-09-16 ENCOUNTER — Encounter (HOSPITAL_COMMUNITY): Payer: Self-pay

## 2013-09-16 DIAGNOSIS — D6481 Anemia due to antineoplastic chemotherapy: Secondary | ICD-10-CM

## 2013-09-16 DIAGNOSIS — C787 Secondary malignant neoplasm of liver and intrahepatic bile duct: Secondary | ICD-10-CM

## 2013-09-16 DIAGNOSIS — C50919 Malignant neoplasm of unspecified site of unspecified female breast: Secondary | ICD-10-CM | POA: Insufficient documentation

## 2013-09-16 DIAGNOSIS — C50412 Malignant neoplasm of upper-outer quadrant of left female breast: Secondary | ICD-10-CM

## 2013-09-16 DIAGNOSIS — Z923 Personal history of irradiation: Secondary | ICD-10-CM | POA: Diagnosis not present

## 2013-09-16 DIAGNOSIS — C773 Secondary and unspecified malignant neoplasm of axilla and upper limb lymph nodes: Secondary | ICD-10-CM

## 2013-09-16 DIAGNOSIS — T451X5A Adverse effect of antineoplastic and immunosuppressive drugs, initial encounter: Secondary | ICD-10-CM

## 2013-09-16 DIAGNOSIS — Z9221 Personal history of antineoplastic chemotherapy: Secondary | ICD-10-CM | POA: Insufficient documentation

## 2013-09-16 DIAGNOSIS — Z9010 Acquired absence of unspecified breast and nipple: Secondary | ICD-10-CM | POA: Diagnosis not present

## 2013-09-16 MED ORDER — GADOBENATE DIMEGLUMINE 529 MG/ML IV SOLN
15.0000 mL | Freq: Once | INTRAVENOUS | Status: AC | PRN
  Administered 2013-09-16: 15 mL via INTRAVENOUS

## 2013-09-16 MED ORDER — HEPARIN SOD (PORK) LOCK FLUSH 100 UNIT/ML IV SOLN
500.0000 [IU] | INTRAVENOUS | Status: DC | PRN
  Filled 2013-09-16: qty 5

## 2013-09-16 MED ORDER — IOHEXOL 300 MG/ML  SOLN
80.0000 mL | Freq: Once | INTRAMUSCULAR | Status: AC | PRN
  Administered 2013-09-16: 80 mL via INTRAVENOUS

## 2013-09-16 MED ORDER — HEPARIN SOD (PORK) LOCK FLUSH 100 UNIT/ML IV SOLN
500.0000 [IU] | INTRAVENOUS | Status: DC
  Administered 2013-09-16: 500 [IU]
  Filled 2013-09-16: qty 5

## 2013-09-18 ENCOUNTER — Ambulatory Visit (HOSPITAL_BASED_OUTPATIENT_CLINIC_OR_DEPARTMENT_OTHER): Payer: Medicaid Other | Admitting: Oncology

## 2013-09-18 VITALS — BP 104/72 | HR 79 | Temp 98.0°F | Resp 18 | Ht 65.0 in | Wt 177.5 lb

## 2013-09-18 DIAGNOSIS — T451X5A Adverse effect of antineoplastic and immunosuppressive drugs, initial encounter: Secondary | ICD-10-CM

## 2013-09-18 DIAGNOSIS — C44599 Other specified malignant neoplasm of skin of other part of trunk: Secondary | ICD-10-CM

## 2013-09-18 DIAGNOSIS — C50412 Malignant neoplasm of upper-outer quadrant of left female breast: Secondary | ICD-10-CM

## 2013-09-18 DIAGNOSIS — D6481 Anemia due to antineoplastic chemotherapy: Secondary | ICD-10-CM

## 2013-09-18 DIAGNOSIS — Z171 Estrogen receptor negative status [ER-]: Secondary | ICD-10-CM

## 2013-09-18 DIAGNOSIS — C50919 Malignant neoplasm of unspecified site of unspecified female breast: Secondary | ICD-10-CM

## 2013-09-18 DIAGNOSIS — C50912 Malignant neoplasm of unspecified site of left female breast: Secondary | ICD-10-CM

## 2013-09-18 DIAGNOSIS — C787 Secondary malignant neoplasm of liver and intrahepatic bile duct: Secondary | ICD-10-CM

## 2013-09-18 DIAGNOSIS — C773 Secondary and unspecified malignant neoplasm of axilla and upper limb lymph nodes: Secondary | ICD-10-CM

## 2013-09-18 DIAGNOSIS — C44509 Unspecified malignant neoplasm of skin of other part of trunk: Secondary | ICD-10-CM

## 2013-09-18 NOTE — Progress Notes (Signed)
[facility]  Telephone:([phone]) [phone] Fax:(336) [phone]  OFFICE PROGRESS NOTE     ID: Luther Newhouse Klink   DOB: Jul 05, 1965  MR#: [id number]  HEN#:[id number]   PCP:No PCP Per Patient SU: Donnie Mesa, MD RAD ONC: Eppie Gibson, MD; Thea Silversmith M.D., Stevphen Rochester MD  CHIEF COMPLAINT: Metastatic breast cancer  CURRENT TREATMENT: Observation  BREAST CANCER HISTORY: From the original intake note:  Cardelia Sassano is a 48 y.o. [place] woman who first noted a small mass in her left breast 01/2010, shortly after she stopped breast-feeding.  It did not disappear with resumption of her periods and grew over the next year, and particularly since she had her miscarrriage in 12/2010. This brought her to the Emergency Room 03/09/2011 where she was found to have a fluctuant area in the UOQ of the left breast measuring 8.4 cm. The tissue beneath the cystic area extending towards the axilla and medially was described as firm, but not erythematous. The cystic area was lanced and a large amount of serosanguinos material was expressed. The patient was referred to Dr. Verita Lamb who saw her 03/11/2011. By this time the fluid had largely reaccumulated. He incised the lesion, again draining serosanguinous liquid, packed it, and set the patient up for left breast US at the [facility] 03/14/2011. This showed an irregularly marginated mass measuring 7.3 cm, with abnormal axillary adenopathy. On 01/1102013 bilateral mammography confirmed a high-density mass in the left UOQ. This was biopsied, as was a left axillary lymph node. Both biopsies ([id number]) showed invasive ductal carcinoma, high grade, triple negative, with an Mib-1 of 97%.  Her subsequent history is as detailed below.  INTERVAL HISTORY: Lala returns today for followup of her breast cancer. Since her last visit here she has spend time with her mother in [place]. According to the patient, her mother is declining. This is causing  Cesily significant stress and she is "detoxing". Otherwise she is excited that her daughter is about to start kindergarten. Her husband Romona Curls has found a job with [employer] locally--he has worked in Leisure centre manager in the past.  ROS: Kathyann has been having more hot flashes. She is taking  Relizen (Femal), in non-estrogenic pollen derivative, and finds it very effective. She has stopped all her other medications. She is gaining weight because she's eating because she is stressed, she says, but in addition she is not exercising regularly. She occasionally has blurred vision, but mostly just "needs new glasses". She feels ready to get on with reconstruction. Aside from these issues a detailed review of systems today was noncontributory   PAST MEDICAL HISTORY: Past Medical History  Diagnosis Date  . Breast abscess 03/2011  . History of bronchitis     last time OCt-Nov 2012  . Seizures     as a toddler ,but none since then.  . Joint pain   . History of blood transfusion     in May 2013-no abnormal reaction noted  . Cancer     Left Breast  . Breast cancer     left  . History of radiation therapy 04/10/13-06/03/13    left breast/chest wall,supraclavicular    PAST SURGICAL HISTORY: Past Surgical History  Procedure Laterality Date  . Dilation and curettage of uterus  01/04/11  . Breast mass excision  2013  . Tonsillectomy    . Portacath placement  04/12/2011    Procedure: INSERTION PORT-A-CATH;  Surgeon: Imogene Burn. [name], MD;  Location: [facility];  Service: General;  Laterality: Right;  right  subclavian  . Incision and drainage breast abscess  03/31/11  . Mastectomy modified radical Left 02/27/2013    Procedure: MASTECTOMY MODIFIED RADICAL;  Surgeon: Haywood Lasso, MD;  Location: [facility];  Service: General;  Laterality: Left;  . Breast surgery      FAMILY HISTORY Family History  Problem Relation Age of Onset  . Diabetes Mother   . Hypertension Mother   [name] died from post-operative  complications at age 9; the patient's mother is 26, lives in [place] The patient is an only child.   GYNECOLOGIC HISTORY: First pregnancy to term age 65. Her menstrual cycles were interrupted with chemotherapy, but resumed in 05/2012.  She has not had a menses since her menstrual cycle in 05/2012.   SOCIAL HISTORY: She is a Research scientist (medical), living in [place] but originally from the [place] area. She has a good deal of family in this area. Her husband Lareta Bruneau worked as a Geophysicist/field seismologist for a Art therapist in [place] but currently he is working for [employer] as a Barrister's clerk. . Their daughter, [name],  will start kindergarten August 2015      ADVANCED DIRECTIVES: Not in place   HEALTH MAINTENANCE: History  Substance Use Topics  . Smoking status: Never Smoker   . Smokeless tobacco: Never Used  . Alcohol Use: Yes     Comment: occasionally wine     Colonoscopy:  N/A  PAP: 03/18/2011  Bone density: N/A  Lipid panel: Not on file   Allergies  Allergen Reactions  . Sulfa Antibiotics Swelling    Current Outpatient Prescriptions  Medication Sig Dispense Refill  . capecitabine (XELODA) 500 MG tablet Take 2 tablets (1,000 mg total) by mouth 2 (two) times daily after a meal.  124 tablet  0  . cholecalciferol (VITAMIN D) 1000 UNITS tablet Take 2,000 Units by mouth every morning.      . ferrous sulfate 325 (65 FE) MG tablet Take 325 mg by mouth daily with breakfast.      . hyaluronate sodium (RADIAPLEXRX) GEL Apply 1 application topically 2 (two) times daily.      [name] HYDROcodone-acetaminophen (NORCO/VICODIN) 5-325 MG per tablet Take 1-2 tablets by mouth every 6 (six) hours as needed for moderate pain (pain).      . LORazepam (ATIVAN) 0.5 MG tablet Take 0.5 mg by mouth every 8 (eight) hours as needed.       . ondansetron (ZOFRAN) 8 MG tablet Take 1 tablet (8 mg total) by mouth every 8 (eight) hours as needed for nausea.  30 tablet  1   No current facility-administered medications for this  visit.   Objective: Middle-aged [place] American woman who appears stated age  44 Vitals:   09/18/13 1056  BP: 104/72  Pulse: 79  Temp: 98 F (36.7 C)  Resp: 18     Body mass index is 29.54 kg/(m^2).    ECOG FS: 0 - Asymptomatic   Filed Weights   09/18/13 1056  Weight: 177 lb 8 oz (80.513 kg)    Sclerae unicteric, pupils equal and reactive Oropharynx clear and moist-- no thrush No cervical or supraclavicular adenopathy Lungs no rales or rhonchi Heart regular rate and rhythm Abd soft, nontender, positive bowel sounds MSK no focal spinal tenderness, no upper extremity lymphedema Neuro: nonfocal, well oriented, appropriate affect Breasts: The right breast is unremarkable. The left breast is status post surgery and radiation. There is significant hyperpigmentation. The skin is a bit tight. There is however no evidence  of local recurrence. The left axilla is benign.   LAB RESULTS: Lab Results  Component Value Date   WBC 2.8* 09/05/2013   NEUTROABS 1.8 09/05/2013   HGB 11.9 09/05/2013   HCT 35.7 09/05/2013   MCV 89.5 09/05/2013   PLT 218 09/05/2013       Chemistry      Component Value Date/Time   NA 139 09/05/2013 1154   NA 136 03/01/2013 0423   K 4.1 09/05/2013 1154   K 4.0 03/01/2013 0423   CL 100 03/01/2013 0423   CL 103 07/05/2012 1448   CO2 26 09/05/2013 1154   CO2 29 03/01/2013 0423   BUN 12.5 09/05/2013 1154   BUN 3* 03/01/2013 0423   CREATININE 0.8 09/05/2013 1154   CREATININE 0.74 03/01/2013 0423      Component Value Date/Time   CALCIUM 8.7 09/05/2013 1154   CALCIUM 7.4* 03/01/2013 0423   ALKPHOS 62 09/05/2013 1154   ALKPHOS 68 02/26/2013 1701   AST 20 09/05/2013 1154   AST 26 02/26/2013 1701   ALT 13 09/05/2013 1154   ALT 10 02/26/2013 1701   BILITOT 0.39 09/05/2013 1154   BILITOT 0.2* 02/26/2013 1701       STUDIES: Ct Chest W Contrast  09/16/2013   CLINICAL DATA:  Breast cancer. Chemotherapy and radiation therapy complete.  EXAM: CT CHEST WITH CONTRAST  TECHNIQUE:  Multidetector CT imaging of the chest was performed during intravenous contrast administration.  CONTRAST:  71mL OMNIPAQUE IOHEXOL 300 MG/ML  SOLN  COMPARISON:  CT 02/05/2013  FINDINGS: Interval left mastectomy and axillary dissection. Noaxillary adenopathy. No supraclavicular adenopathy. No internal mammary adenopathy. Prominent vessel along the along the course of the right internal mammary chain is unchanged. No mediastinal hilar lymphadenopathy. No pericardial fluid.  Review of the lung parenchyma demonstrates a patchy nodular airspace disease in the left upper lobe which is new from prior.  Limited view of the upper abdomen is unremarkable. Limited view of the skeleton is unremarkable  IMPRESSION: 1. No evidence breast cancer recurrence. 2. Patchy nodular airspace opacities in the left upper lobe is new from prior. This is not the typical pattern of radiation change but favor these to be related to infection or inflammation and and potentially related to therapy. Recommend correlation with acute pulmonary infection and recommend attention on follow-up.   Electronically Signed   By: Suzy Bouchard M.D.   On: 09/16/2013 19:59   Mr Abdomen W Wo Contrast  09/17/2013   CLINICAL DATA:  BREAST CANCER WITH PREVIOUS LIVER LESION.  EXAM: MRI ABDOMEN WITHOUT AND WITH CONTRAST  TECHNIQUE: Multiplanar multisequence MR imaging of the abdomen was performed both before and after the administration of intravenous contrast.  CONTRAST:  81mL MULTIHANCE GADOBENATE DIMEGLUMINE 529 MG/ML IV SOLN  COMPARISON:  05/29/2013.  05/04/2012.  FINDINGS: Lung Bases: Unremarkable.  Liver: No focal mass lesion is seen within the liver. The subtle area of capsular retraction seen anteriorly in the lateral segment left liver, at the location of the lesion described on previous studies, is stable today. There is no abnormal enhancement or mass effect this location on the current exam. No new focal abnormality within the liver parenchyma. The  liver measures 18.9 cm in craniocaudal length, mildly enlarged.  Spleen: Normal  Stomach: Normal.  Pancreas: No enhancing mass. There is no dilatation of the main duct.  Gallbladder/Biliary: No evidence for gallstones. No intra or extrahepatic biliary duct dilatation.  Kidneys/Adrenals: No adrenal nodule or mass. Kidneys are unremarkable.  Bowel  Loops: No evidence of bowel obstruction.  Nodes: No lymphadenopathy in the abdomen.  Vasculature: No abdominal aortic aneurysm.  Portal vein is patent.  Bones/Musculoskeletal: No abnormal marrow signal within the visualized bony structures.  Body Wall: No evidence for abdominal wall hernia.  Other: No intraperitoneal free fluid.  IMPRESSION: Stable exam. The tiny area of apparent scar in the anterior subcapsular aspect of the lateral segment left liver is unchanged. No new or progressive findings to suggest recurrent disease.   Electronically Signed   By: Misty Stanley M.D.   On: 09/17/2013 08:27    ASSESSMENT: [name] is a 48 y.o.  [place], [place] woman with stage IV breast cancer (liver involvement at presentation but no bone, lung or brain spread):  (1)  Status post upper outer quadrant left breast biopsy 03/18/2011 showing a clinical T4,N1 invasive ductal carcinoma, high grade, triple negative, with an Mib-1 of 97%, with a single liver metastasis pathologically confirmed 03/31/2011.  (2) Status post 4 cycles of Q three-week docetaxel/ doxorubicin/ cyclophosphamide in the neoadjuvant setting, discontinued after 4 cycles due to peripheral neuropathy.  Status post 2 cycles of every 3 week carboplatin/ gemcitabine, for a total of 6 neoadjuvant cycles altogether, completed 11/09/2011.  (3) The patient refused surgery.  (4) With subsequent tumor re-growth, started Carboplatin/ Gemcitabine on 09/10/2012, with treatment on days 1 and 8 of each 21 day cycle, received six cycles, last dose 01/28/2013; response was moderate  (5) status post left  modified radical mastectomy 02/27/2013 for a residual pT4b pN2a invasive ductal carcinoma, repeat prognostic panel again triple negative, with an MIB-1 of 90%.  (6) recurrence at left mastectomy wound excised 04/02/2013  (6) radiation therapy to left chest wall and regional drainage nodes started 04/10/2013 with capecitabine sensitization (1000 mg po BID on treatment days), completed 06/03/2013  (7) the patient is interested in proceeding with reconstruction  (8) restaging liver MRI shows only a 3 mm lesion consistent with scar   PLAN:  I am delighted that Darcelle is doing so well clinically, and that there is no evidence of active disease at present. In particular the liver MRI is very favorable.  She would like to explore reconstruction, and I am referring her to Dr. Isa Rankin at [facility] to discuss that further.  There is enough information on the relizen she is taking to show that it appears to work at least for some patients and it does not appear to contain any estrogen compounds.  She is already scheduled to see her surgeon next week. She will see me again in 3 months. We will obtain a chest x-ray prior to that visit. She knows to call for any problems that may develop before then.   Chauncey Cruel, MD   09/18/2013 5:15 PM

## 2013-09-24 ENCOUNTER — Telehealth: Payer: Self-pay | Admitting: Oncology

## 2013-09-24 NOTE — Telephone Encounter (Signed)
Faxed pt medical records to Dr. Maia Plan office.  The nurse will triage records,it could take 1-2 weeks before Dr. Maia Plan office calls pt to schedule appt. Pt is aware

## 2013-09-25 ENCOUNTER — Other Ambulatory Visit: Payer: Self-pay | Admitting: Oncology

## 2013-09-25 DIAGNOSIS — C50912 Malignant neoplasm of unspecified site of left female breast: Secondary | ICD-10-CM

## 2013-09-25 DIAGNOSIS — Z9012 Acquired absence of left breast and nipple: Secondary | ICD-10-CM

## 2013-09-25 DIAGNOSIS — Z1231 Encounter for screening mammogram for malignant neoplasm of breast: Secondary | ICD-10-CM

## 2013-09-26 ENCOUNTER — Telehealth: Payer: Self-pay | Admitting: Oncology

## 2013-09-26 ENCOUNTER — Ambulatory Visit (INDEPENDENT_AMBULATORY_CARE_PROVIDER_SITE_OTHER): Payer: Medicaid Other | Admitting: Surgery

## 2013-09-26 NOTE — Telephone Encounter (Signed)
S/W PT RE APPTS FOR AUG THRU NOV. ALSO CONFIRMED MAMMO FOR 8/7. LYMPHEDEMA APPTS ON SCHEUDLE - MESSAGE TO HIM VIA REFERRAL INBASKET RE APPT @ [facility]. [facility] HAS ENTERED ORDER FOR CXR AND CXR NOW ON SCHEDULE. PT WILL GET NEW SCHEDULE WHEN SHE COMES IN FOR 8/19 APPT.

## 2013-09-27 ENCOUNTER — Encounter (INDEPENDENT_AMBULATORY_CARE_PROVIDER_SITE_OTHER): Payer: Self-pay | Admitting: Surgery

## 2013-09-27 ENCOUNTER — Ambulatory Visit (INDEPENDENT_AMBULATORY_CARE_PROVIDER_SITE_OTHER): Payer: Medicaid Other | Admitting: Surgery

## 2013-09-27 VITALS — BP 122/80 | HR 74 | Temp 97.8°F | Ht 65.0 in | Wt 184.0 lb

## 2013-09-27 DIAGNOSIS — C50919 Malignant neoplasm of unspecified site of unspecified female breast: Secondary | ICD-10-CM

## 2013-09-27 DIAGNOSIS — C50412 Malignant neoplasm of upper-outer quadrant of left female breast: Secondary | ICD-10-CM

## 2013-09-27 DIAGNOSIS — C787 Secondary malignant neoplasm of liver and intrahepatic bile duct: Secondary | ICD-10-CM

## 2013-09-27 DIAGNOSIS — C773 Secondary and unspecified malignant neoplasm of axilla and upper limb lymph nodes: Secondary | ICD-10-CM

## 2013-09-27 DIAGNOSIS — C50912 Malignant neoplasm of unspecified site of left female breast: Secondary | ICD-10-CM

## 2013-09-27 DIAGNOSIS — C50419 Malignant neoplasm of upper-outer quadrant of unspecified female breast: Secondary | ICD-10-CM

## 2013-09-27 NOTE — Progress Notes (Signed)
Alyssa May is a 48 y.o. [place] woman who first noted a small mass in her left breast 01/2010, shortly after she stopped breast-feeding. It did not disappear with resumption of her periods and grew over the next year, and particularly since she had her miscarrriage in 12/2010. This brought her to the Emergency Room 03/09/2011 where she was found to have a fluctuant area in the UOQ of the left breast measuring 8.4 cm. The tissue beneath the cystic area extending towards the axilla and medially was described as firm, but not erythematous. The cystic area was lanced and a large amount of serosanguinos material was expressed. The patient was referred to me on 03/11/2011. By this time the fluid had largely reaccumulated. He incised the lesion, again draining serosanguinous liquid, packed it, and set the patient up for left breast US at the [facility] 03/14/2011. This showed an irregularly marginated mass measuring 7.3 cm, with abnormal axillary adenopathy. On 01/1102013 bilateral mammography confirmed a high-density mass in the left UOQ. This was biopsied, as was a left axillary lymph node. Both biopsies ([id number]) showed invasive ductal carcinoma, high grade, triple negative, with an Mib-1 of 97%.  She had a delayed (self-inflicted) course of neoadjuvant chemotherapy with some reduction in the size of the tumor.  She also has a liver mass assumed to be metastatic disease.  She underwent left mastectomy urgently on 02/27/13 because of bleeding.  She developed a chest wall mass about a month after surgery which was excised and showed metastatic disease.  She received radiation to her chest wall with good results.  The liver mass has receded.  Currently she is not undergoing any treatment.   Ct Chest W Contrast  09/16/2013   CLINICAL DATA:  Breast cancer. Chemotherapy and radiation therapy complete.  EXAM: CT CHEST WITH CONTRAST  TECHNIQUE: Multidetector CT imaging of the chest was performed during  intravenous contrast administration.  CONTRAST:  64mL OMNIPAQUE IOHEXOL 300 MG/ML  SOLN  COMPARISON:  CT 02/05/2013  FINDINGS: Interval left mastectomy and axillary dissection. Noaxillary adenopathy. No supraclavicular adenopathy. No internal mammary adenopathy. Prominent vessel along the along the course of the right internal mammary chain is unchanged. No mediastinal hilar lymphadenopathy. No pericardial fluid.  Review of the lung parenchyma demonstrates a patchy nodular airspace disease in the left upper lobe which is new from prior.  Limited view of the upper abdomen is unremarkable. Limited view of the skeleton is unremarkable  IMPRESSION: 1. No evidence breast cancer recurrence. 2. Patchy nodular airspace opacities in the left upper lobe is new from prior. This is not the typical pattern of radiation change but favor these to be related to infection or inflammation and and potentially related to therapy. Recommend correlation with acute pulmonary infection and recommend attention on follow-up.   Electronically Signed   By: Suzy Bouchard M.D.   On: 09/16/2013 19:59   Mr Abdomen W Wo Contrast  09/17/2013   CLINICAL DATA:  BREAST CANCER WITH PREVIOUS LIVER LESION.  EXAM: MRI ABDOMEN WITHOUT AND WITH CONTRAST  TECHNIQUE: Multiplanar multisequence MR imaging of the abdomen was performed both before and after the administration of intravenous contrast.  CONTRAST:  5mL MULTIHANCE GADOBENATE DIMEGLUMINE 529 MG/ML IV SOLN  COMPARISON:  05/29/2013.  05/04/2012.  FINDINGS: Lung Bases: Unremarkable.  Liver: No focal mass lesion is seen within the liver. The subtle area of capsular retraction seen anteriorly in the lateral segment left liver, at the location of the lesion described on previous studies, is stable today.  There is no abnormal enhancement or mass effect this location on the current exam. No new focal abnormality within the liver parenchyma. The liver measures 18.9 cm in craniocaudal length, mildly  enlarged.  Spleen: Normal  Stomach: Normal.  Pancreas: No enhancing mass. There is no dilatation of the main duct.  Gallbladder/Biliary: No evidence for gallstones. No intra or extrahepatic biliary duct dilatation.  Kidneys/Adrenals: No adrenal nodule or mass. Kidneys are unremarkable.  Bowel Loops: No evidence of bowel obstruction.  Nodes: No lymphadenopathy in the abdomen.  Vasculature: No abdominal aortic aneurysm.  Portal vein is patent.  Bones/Musculoskeletal: No abnormal marrow signal within the visualized bony structures.  Body Wall: No evidence for abdominal wall hernia.  Other: No intraperitoneal free fluid.  IMPRESSION: Stable exam. The tiny area of apparent scar in the anterior subcapsular aspect of the lateral segment left liver is unchanged. No new or progressive findings to suggest recurrent disease.   Electronically Signed   By: Misty Stanley M.D.   On: 09/17/2013 08:27    Filed Vitals:   09/27/13 0900  BP: 122/80  Pulse: 74  Temp: 97.8 F (36.6 C)    Her left chest shows signs of radiation changes her incision is well healed with no palpable nodules. No axillary lymphadenopathy. No right breast masses or right axillary lymphadenopathy.  The patient is continuing her care under Dr. Jana Hakim every 3 months.  We will reevaluate her in one year.  Imogene Burn. [name], MD, [facility]  General/ Trauma Surgery  09/27/2013 9:24 AM

## 2013-10-08 ENCOUNTER — Telehealth: Payer: Self-pay | Admitting: Oncology

## 2013-10-08 NOTE — Telephone Encounter (Signed)
Pt appt. With Dr. [email or web address]@[facility]  is 11/04/13@10 :40

## 2013-10-08 NOTE — Telephone Encounter (Signed)
Called Dr. Maia Plan office to follow-up on referral. Tonya from Dr. Maia Plan office never received the records. I refaxed records.

## 2013-10-11 ENCOUNTER — Ambulatory Visit
Admission: RE | Admit: 2013-10-11 | Discharge: 2013-10-11 | Disposition: A | Discharge status: Home / Self Care | Payer: Medicaid Other | Source: Ambulatory Visit | Attending: Oncology | Admitting: Oncology

## 2013-10-11 DIAGNOSIS — Z1231 Encounter for screening mammogram for malignant neoplasm of breast: Secondary | ICD-10-CM

## 2013-10-11 DIAGNOSIS — Z9012 Acquired absence of left breast and nipple: Secondary | ICD-10-CM

## 2013-10-15 ENCOUNTER — Ambulatory Visit: Payer: Medicaid Other | Attending: Oncology | Admitting: Physical Therapy

## 2013-10-15 ENCOUNTER — Ambulatory Visit (INDEPENDENT_AMBULATORY_CARE_PROVIDER_SITE_OTHER): Payer: Medicaid Other | Admitting: Surgery

## 2013-10-15 DIAGNOSIS — IMO0001 Reserved for inherently not codable concepts without codable children: Secondary | ICD-10-CM | POA: Insufficient documentation

## 2013-10-15 DIAGNOSIS — I890 Lymphedema, not elsewhere classified: Secondary | ICD-10-CM | POA: Diagnosis not present

## 2013-10-15 DIAGNOSIS — Z923 Personal history of irradiation: Secondary | ICD-10-CM | POA: Insufficient documentation

## 2013-10-15 DIAGNOSIS — M24519 Contracture, unspecified shoulder: Secondary | ICD-10-CM | POA: Diagnosis not present

## 2013-10-15 DIAGNOSIS — Z9010 Acquired absence of unspecified breast and nipple: Secondary | ICD-10-CM | POA: Insufficient documentation

## 2013-10-15 DIAGNOSIS — M25619 Stiffness of unspecified shoulder, not elsewhere classified: Secondary | ICD-10-CM | POA: Diagnosis not present

## 2013-10-15 DIAGNOSIS — C50919 Malignant neoplasm of unspecified site of unspecified female breast: Secondary | ICD-10-CM | POA: Insufficient documentation

## 2013-10-17 ENCOUNTER — Telehealth: Payer: Self-pay

## 2013-10-17 NOTE — Telephone Encounter (Signed)
Faxed signed assessment and treatment plan to [facility].  Sent to scan.

## 2013-11-08 ENCOUNTER — Emergency Department (HOSPITAL_COMMUNITY)
Admission: EM | Admit: 2013-11-08 | Discharge: 2013-11-08 | Disposition: A | Discharge status: Home / Self Care | Payer: Medicaid Other | Attending: Emergency Medicine | Admitting: Emergency Medicine

## 2013-11-08 ENCOUNTER — Encounter (HOSPITAL_COMMUNITY): Payer: Self-pay | Admitting: Emergency Medicine

## 2013-11-08 DIAGNOSIS — Z923 Personal history of irradiation: Secondary | ICD-10-CM | POA: Diagnosis not present

## 2013-11-08 DIAGNOSIS — Z8742 Personal history of other diseases of the female genital tract: Secondary | ICD-10-CM | POA: Insufficient documentation

## 2013-11-08 DIAGNOSIS — Z79899 Other long term (current) drug therapy: Secondary | ICD-10-CM | POA: Insufficient documentation

## 2013-11-08 DIAGNOSIS — Z8669 Personal history of other diseases of the nervous system and sense organs: Secondary | ICD-10-CM | POA: Insufficient documentation

## 2013-11-08 DIAGNOSIS — Z853 Personal history of malignant neoplasm of breast: Secondary | ICD-10-CM | POA: Insufficient documentation

## 2013-11-08 DIAGNOSIS — Z8709 Personal history of other diseases of the respiratory system: Secondary | ICD-10-CM | POA: Insufficient documentation

## 2013-11-08 DIAGNOSIS — R21 Rash and other nonspecific skin eruption: Secondary | ICD-10-CM | POA: Diagnosis present

## 2013-11-08 DIAGNOSIS — B019 Varicella without complication: Secondary | ICD-10-CM | POA: Insufficient documentation

## 2013-11-08 DIAGNOSIS — Z8739 Personal history of other diseases of the musculoskeletal system and connective tissue: Secondary | ICD-10-CM | POA: Insufficient documentation

## 2013-11-08 MED ORDER — ACYCLOVIR 400 MG PO TABS: 800.0000 mg | ORAL_TABLET | Freq: Every day | ORAL | Status: DC

## 2013-11-08 MED ORDER — HYDROXYZINE HCL 25 MG PO TABS: 25.0000 mg | ORAL_TABLET | Freq: Four times a day (QID) | ORAL | Status: DC

## 2013-11-08 NOTE — Discharge Instructions (Signed)
Chickenpox °Chickenpox is an illness caused by a virus. Chickenpox can be very serious for adults. Adults have a higher risk for complications than children. Complications of chickenpox include: °· Pneumonia. °· Skin infection. °· Bone infection (osteomyelitis). °· Joint infection (septic arthritis). °· Brain infection (encephalitis). °· Toxic shock syndrome. °· Bleeding problems. °· Problems with balance and muscle control (cerebellar ataxia). °· Having a baby with a birth defect, if you are pregnant. °· Death. °If you have had chickenpox once, you probably will not get it again. If you are exposed to chickenpox and have never had the illness or the vaccine, you may develop the illness within 21 days. °CAUSES  °Chickenpox is caused by a virus. This virus spreads easily from one person to another through the tiny droplets released into the air when an infected person coughs or sneezes. It also spreads through the fluid produced by the chickenpox rash. °RISK FACTORS °People who have never had chickenpox and have never been vaccinated are at risk. You may also be at greater risk for chickenpox if: °· You are a health care worker. °· You are a college student. °· You are in the military. °· You live in an institution. °· You are a teacher. °· You have poor resistance to infection. °SIGNS AND SYMPTOMS  °· Rash. The rash is made up of itchy blisters that heal over with a crusty scab. The rash may appear a day or two after other symptoms. °· Body aches and pain. °· Headache. °· Irritability. °· Tiredness. °· Fever. °· Sore throat. °DIAGNOSIS  °Your health care provider will make a diagnosis based on your symptoms. You may have a blood test to confirm the diagnosis. °TREATMENT  °Treatments may include: °· Taking medicine to shorten how long the illness lasts and to reduce its severity. °· Applying calamine lotion to relieve itchiness. °· Using baking soda or oatmeal baths to soothe itchy skin. °Ask your health care  provider if you may use a type of over-the-counter medicine called an antihistamine to reduce itching. °HOME CARE INSTRUCTIONS  °· Take medicines only as directed by your health care provider. °· Try taking a bath in lukewarm water every few hours. Add several tablespoons of baking soda or oatmeal to the water to make the bath more soothing. Do not bathe in hot water. °· Apply an ice pack or a cold washcloth to the rash to relieve itching. °· Wash your hands often. This lowers your chance of getting a bacterial skin infection and passing the virus to others. °· Do not eat or drink spicy, salty, or acidic things if you have blisters in your mouth. Soft, bland, and cold foods and beverages will be easiest to swallow. °· Do not be around: °¨ People who have not had chickenpox and have not been vaccinated against it. °¨ People with a weakened immune system, including those with HIV, AIDS, or cancer, those who have had a transplant or are on chemotherapy, and those who take medicines that weaken the immune system. °¨ Pregnant women. °· If a person is exposed to your chickenpox and has not had it before or been vaccinated against it, has a weakened immune system, or is pregnant, he or she should call a health care provider. °SEEK IMMEDIATE MEDICAL CARE IF:  °· You have trouble breathing. °· You have a severe headache. °· You have a stiff neck. °· You have severe joint pain or stiffness. °· You feel disoriented or confused. °· You have trouble walking   or keeping your balance. °· You have a fever and your symptoms suddenly get worse. °· The area around one of the blisters leaks pus or becomes very red, hot to the touch, or painful. °MAKE SURE YOU: °· Understand these instructions. °· Will watch your condition. °· Will get help right away if you are not doing well or get worse. °Document Released: 12/01/2007 Document Revised: 07/08/2013 Document Reviewed: 01/15/2013 °ExitCare® Patient Information ©2015 [employer]. This  information is not intended to replace advice given to you by your health care provider. Make sure you discuss any questions you have with your health care provider. ° °

## 2013-11-08 NOTE — ED Provider Notes (Signed)
CSN: [id number]     Arrival date & time 11/08/13  9629 History   First MD Initiated Contact with Patient 11/08/13 [phone]     Chief Complaint  Patient presents with  . Rash    HPI Patient presents to the emergency room with complaints of a rash that started this Wednesday. Patient states she noticed it after taking a walk [place] however she does not recall getting exposed to anything in particular. Patient has not had any trouble with fevers cough or cold symptoms. The rash is pruritic. It involved her legs, arms and torso. She has never had anything like this before. She does have a history of breast cancer but is in remission. She has not had any chemotherapy or radiation treatment since March. Past Medical History  Diagnosis Date  . Breast abscess 03/2011  . History of bronchitis     last time OCt-Nov 2012  . Seizures     as a toddler ,but none since then.  . Joint pain   . History of blood transfusion     in May 2013-no abnormal reaction noted  . Cancer     Left Breast  . Breast cancer     left  . History of radiation therapy 04/10/13-06/03/13    left breast/chest wall,supraclavicular   Past Surgical History  Procedure Laterality Date  . Dilation and curettage of uterus  01/04/11  . Breast mass excision  2013  . Tonsillectomy    . Portacath placement  04/12/2011    Procedure: INSERTION PORT-A-CATH;  Surgeon: Imogene Burn. Tsuei, MD;  Location: [facility];  Service: General;  Laterality: Right;  right subclavian  . Incision and drainage breast abscess  03/31/11  . Mastectomy modified radical Left 02/27/2013    Procedure: MASTECTOMY MODIFIED RADICAL;  Surgeon: Haywood Lasso, MD;  Location: [facility];  Service: General;  Laterality: Left;  . Breast surgery     Family History  Problem Relation Age of Onset  . Diabetes Mother   . Hypertension Mother    History  Substance Use Topics  . Smoking status: Never Smoker   . Smokeless tobacco: Never Used  . Alcohol Use: Yes     Comment:  occasionally wine   OB History   Grav Para Term Preterm Abortions TAB SAB Ect Mult Living   6    6 3 3   1      Review of Systems  All other systems reviewed and are negative.     Allergies  Sulfa antibiotics  Home Medications   Prior to Admission medications   Medication Sig Start Date End Date Taking? Authorizing Provider  acyclovir (ZOVIRAX) 400 MG tablet Take 2 tablets (800 mg total) by mouth 5 (five) times daily. 11/08/13   Dorie Rank, MD  capecitabine (XELODA) 500 MG tablet Take 2 tablets (1,000 mg total) by mouth 2 (two) times daily after a meal. 04/02/13   Chauncey Cruel, MD  cholecalciferol (VITAMIN D) 1000 UNITS tablet Take 2,000 Units by mouth every morning.    Historical Provider, MD  ferrous sulfate 325 (65 FE) MG tablet Take 325 mg by mouth daily with breakfast.    Historical Provider, MD  hyaluronate sodium (RADIAPLEXRX) GEL Apply 1 application topically 2 (two) times daily.    Historical Provider, MD  HYDROcodone-acetaminophen (NORCO/VICODIN) 5-325 MG per tablet Take 1-2 tablets by mouth every 6 (six) hours as needed for moderate pain (pain).    Historical Provider, MD  hydrOXYzine (ATARAX/VISTARIL) 25 MG tablet Take 1  tablet (25 mg total) by mouth every 6 (six) hours. 11/08/13   Dorie Rank, MD  LORazepam (ATIVAN) 0.5 MG tablet Take 0.5 mg by mouth every 8 (eight) hours as needed.     Historical Provider, MD  ondansetron (ZOFRAN) 8 MG tablet Take 1 tablet (8 mg total) by mouth every 8 (eight) hours as needed for nausea. 10/08/12   Amy G Berry, PA-C   BP 110/82  Pulse 91  Temp(Src) 98.8 F (37.1 C)  Resp 18  SpO2 100% Physical Exam  Nursing note and vitals reviewed. Constitutional: She appears well-developed and well-nourished. No distress.  HENT:  Head: Normocephalic and atraumatic.  Right Ear: External ear normal.  Left Ear: External ear normal.  Eyes: Conjunctivae are normal. Right eye exhibits no discharge. Left eye exhibits no discharge. No scleral icterus.   Neck: Neck supple. No tracheal deviation present.  Cardiovascular: Normal rate.   Pulmonary/Chest: Effort normal. No stridor. No respiratory distress.  Musculoskeletal: She exhibits no edema.  Neurological: She is alert. Cranial nerve deficit: no gross deficits.  Skin: Skin is warm and dry. Rash noted. No lesion, no petechiae and no purpura noted. Rash is nodular and vesicular. There is erythema.  Vesicular lesions on an erythematous base, varying sizes, no purulent drainage, the rash involves her extremities as well as her torso  Psychiatric: She has a normal mood and affect.    ED Course  Procedures (including critical care time) Labs Review Labs Reviewed - No data to display  Imaging Review No results found.   EKG Interpretation None      MDM   Final diagnoses:  Varicella   Rashes consistent with chickenpox. She has an atypical presentation however and that she is not having any cough or cold symptoms. She is afebrile.    I doubt this some type of rash associated with insect bite such as chiggers or fire ants considering they are not painful in the rash involves her whole body.  Recommended followup with her primary doctor.  Considering her age I will offer prescription of acyclovir.   Dorie Rank, MD 11/08/13 [phone]

## 2013-11-08 NOTE — ED Notes (Signed)
Pt presents to ED with rash since Wednesday. Sts it started after walking through the park. Pt has rash all over her body.

## 2013-11-08 NOTE — ED Notes (Signed)
Pt alert and oriented x4. Respirations even and unlabored, bilateral symmetrical rise and fall of chest. Skin warm and dry. In no acute distress. Denies needs.   

## 2014-01-06 ENCOUNTER — Encounter (HOSPITAL_COMMUNITY): Payer: Self-pay | Admitting: Emergency Medicine

## 2014-01-11 IMAGING — CT CT CHEST W/ CM
2 of 5 series · 15 of 36 positions shown, 18 images · IV contrast (OMNIPAQUE)
Comparison: None.

CLINICAL DATA: Breast cancer followup

EXAM:
CT CHEST, ABDOMEN, AND PELVIS WITH CONTRAST
TECHNIQUE: Multidetector CT imaging of the chest, abdomen and pelvis was
performed following the standard protocol during bolus
administration of intravenous contrast.
CONTRAST:  100mL OMNIPAQUE IOHEXOL 300 MG/ML  SOLN

[Series 2: cap with st · axial · 0.85mm/px · z∈[-746,-246]mm · 12 of 116 slices shown, 15 images]
[im 8/116  mediastinal]
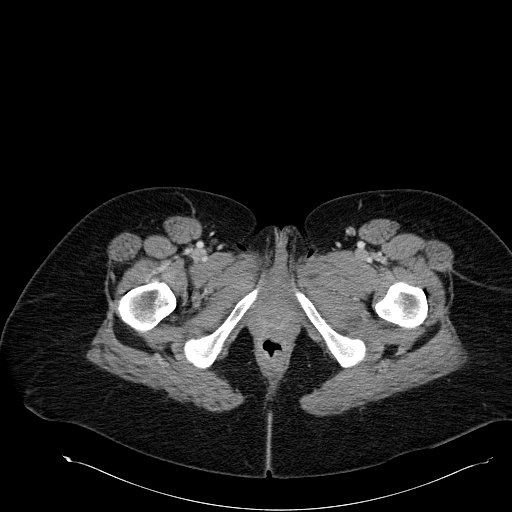
[im 8/116  lung]
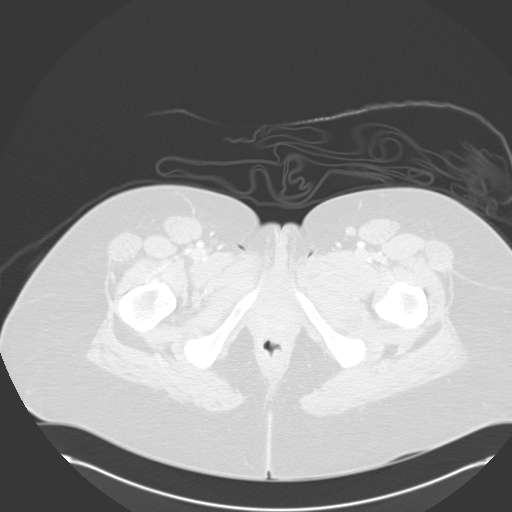
[im 15/116  lung]
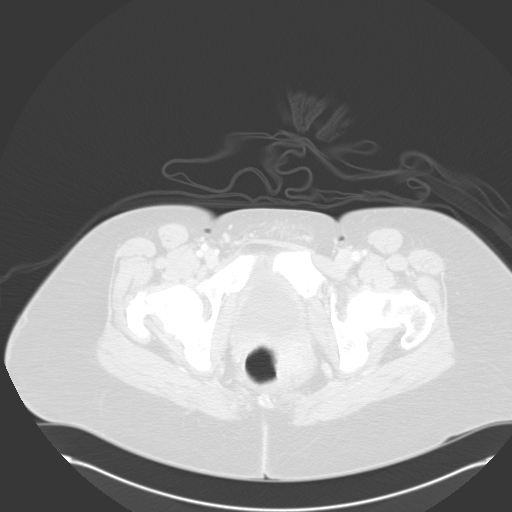
[im 29/116  lung]
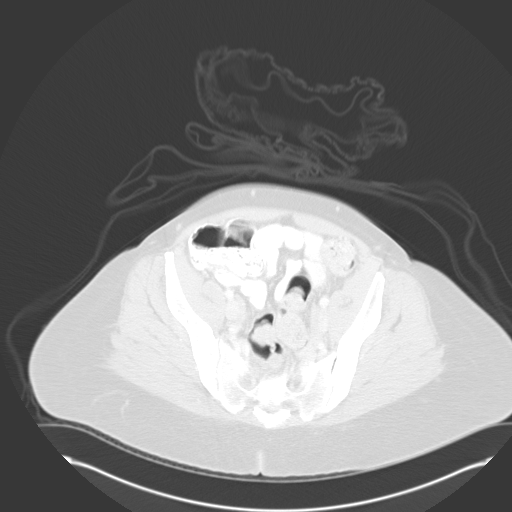
[im 36/116  lung]
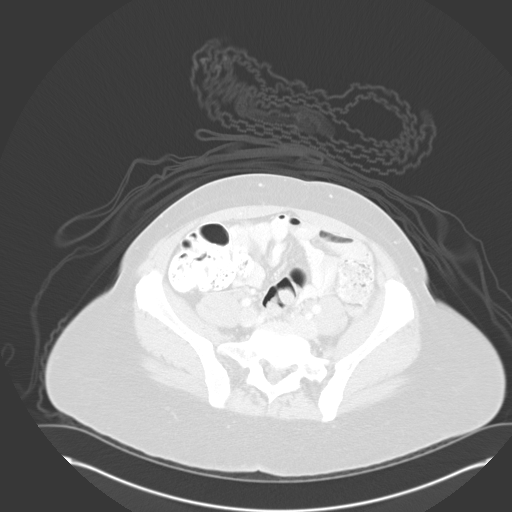
[im 44/116  mediastinal]
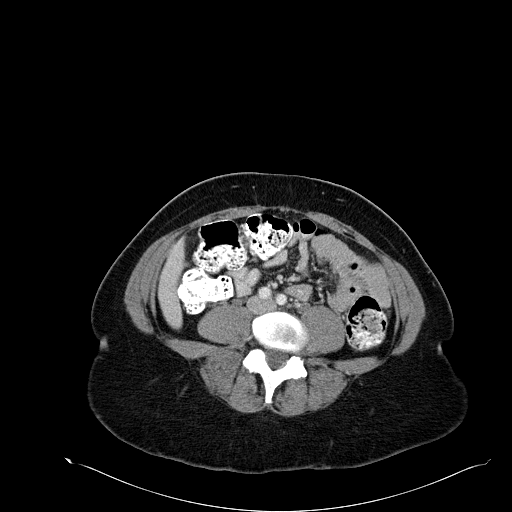
[im 44/116  lung]
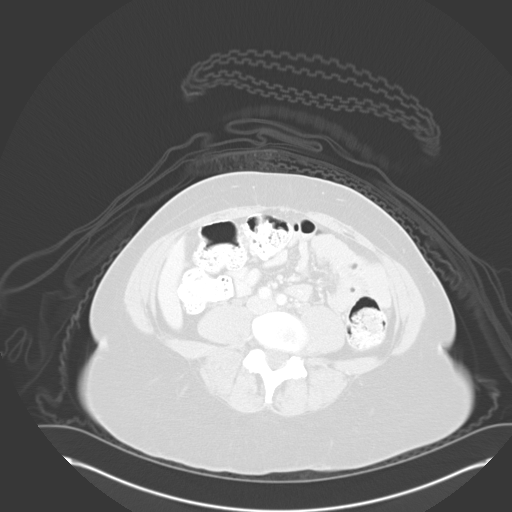
[im 51/116  lung]
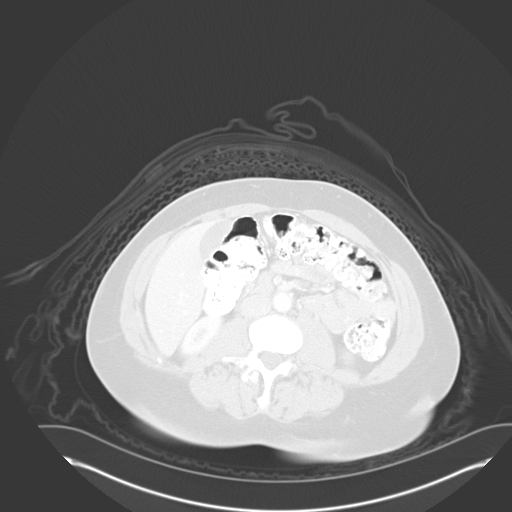
[im 65/116  lung]
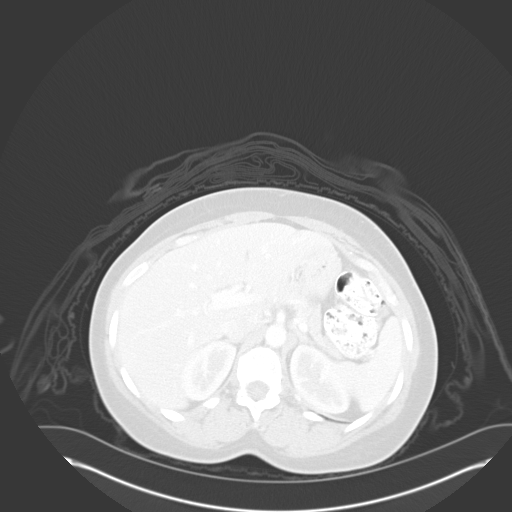
[im 72/116  lung]
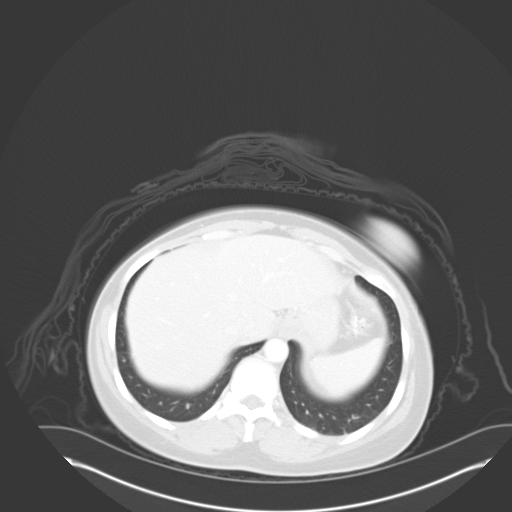
[im 80/116  mediastinal]
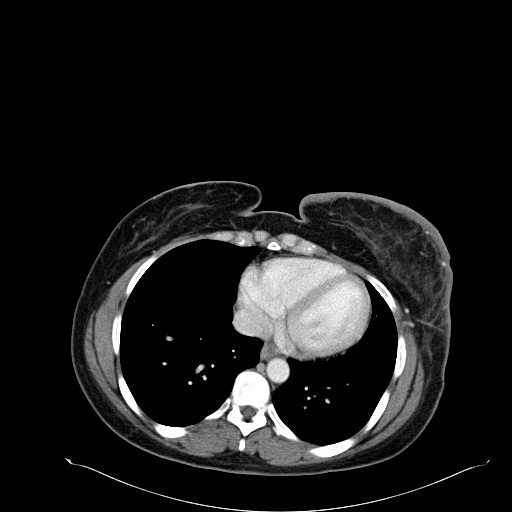
[im 80/116  lung]
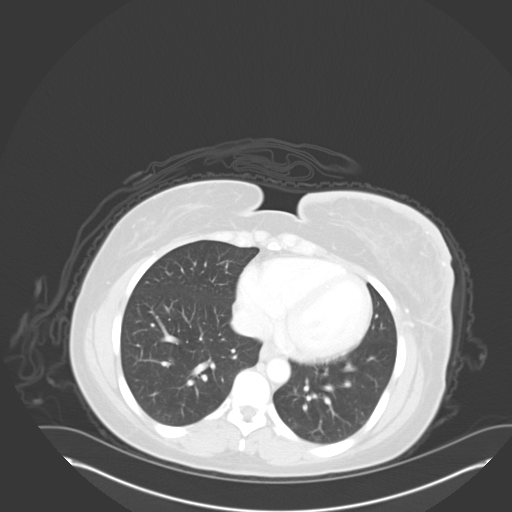
[im 87/116  lung]
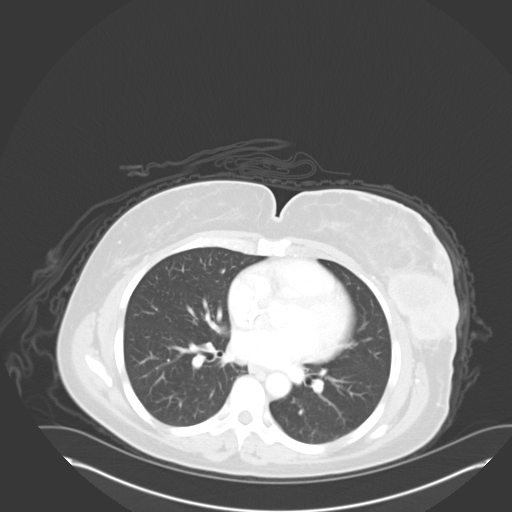
[im 101/116  lung]
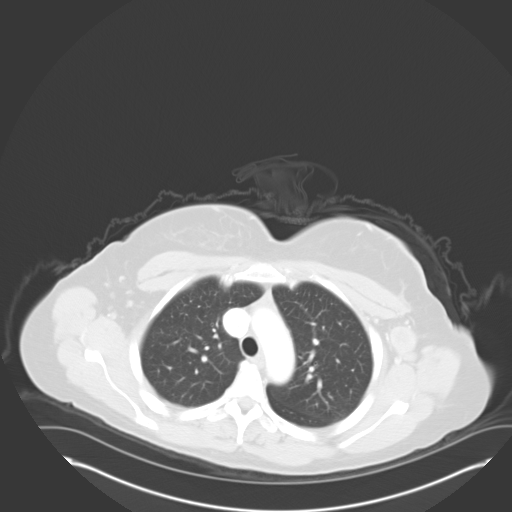
[im 108/116  lung]
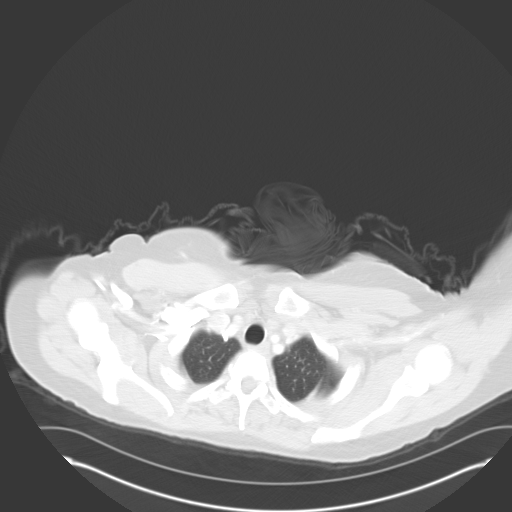

[Series 602: coronal images · coronal · 1.16mm/px · 3 of 84 slices shown]
[im 17/84  lung]
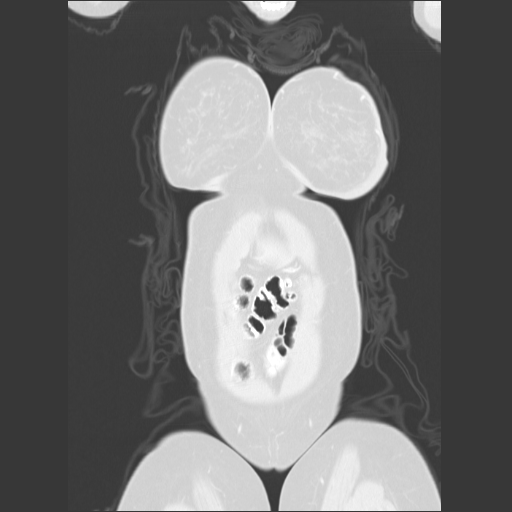
[im 34/84  lung]
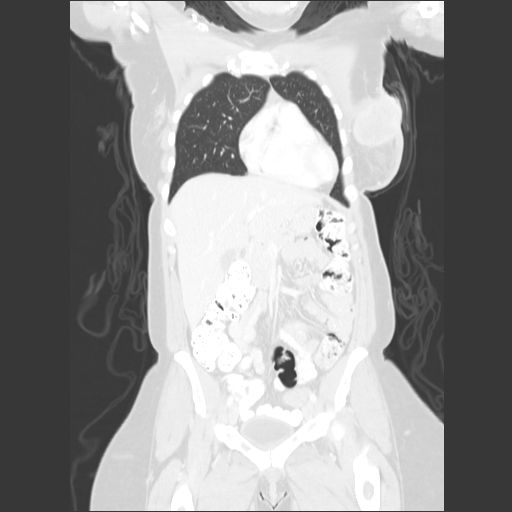
[im 50/84  lung]
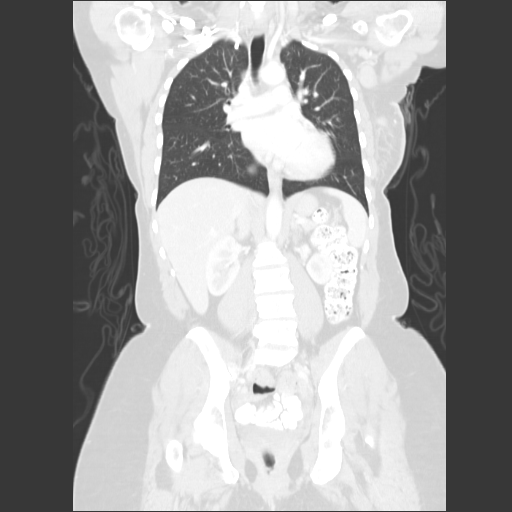

[15 of 36 positions shown; findings below may reference images not displayed]

FINDINGS: CT CHEST FINDINGS

There is no pleural effusion identified. No airspace consolidation
or atelectasis identified. Mild cardiac enlargement. No pericardial
effusion. No suspicious mediastinal or hilar adenopathy identified.

Asymmetric left-sided skin thickening overlies the left breast.
Enhancing in heterogeneous mass within the periphery of the left
breast measures 6.8 x 4.6 cm, image 29/series 2. On the previous
exam this measured 4.8 x 3.2 cm. Pedunculated and ulcerative
component to this mass measures 3.3 cm, image 22/series 2. Enlarged
left axillary lymph nodes are again noted. Index node measures
cm, image 17/series 2. This is compared with 1.2 cm previously.

No supraclavicular or right axillary adenopathy. No internal mammary
adenopathy identified. Review of the visualized osseous structures
shows no aggressive lytic or sclerotic bone lesions.

CT ABDOMEN AND PELVIS FINDINGS

There is no focal liver abnormality. The gallbladder appears within
normal limits. No biliary dilatation. Normal appearance of the
pancreas. The spleen is normal.

The adrenal glands are both normal. The left kidney is normal. The
right kidney is also normal. The urinary bladder is unremarkable.
The uterus and adnexal structures are negative.

Normal caliber of the abdominal aorta. No aneurysm. There is no
upper abdominal adenopathy noted. No pelvic or inguinal adenopathy
noted.

No free fluid or abnormal fluid collections identified.

The stomach appears normal. The small bowel loops have a normal
course and caliber and there is no obstruction.

Normal appearance of the colon. Review of the visualized bony
structures shows changes of osteitis pubis, image 104/series 2. No
aggressive lytic or sclerotic bone lesions identified.
IMPRESSION: CT chest:

1. Increase in size of large, partially ulcerative mass involving
the lateral aspect of the left breast.
2. Increase in left axillary adenopathy.
CT abdomen and pelvis:

1. No specific features identified to suggest metastatic disease.

## 2014-01-21 ENCOUNTER — Other Ambulatory Visit: Payer: Self-pay | Admitting: *Deleted

## 2014-01-21 DIAGNOSIS — C50919 Malignant neoplasm of unspecified site of unspecified female breast: Secondary | ICD-10-CM

## 2014-01-21 DIAGNOSIS — C773 Secondary and unspecified malignant neoplasm of axilla and upper limb lymph nodes: Principal | ICD-10-CM

## 2014-01-22 ENCOUNTER — Ambulatory Visit (HOSPITAL_BASED_OUTPATIENT_CLINIC_OR_DEPARTMENT_OTHER): Payer: Medicaid Other

## 2014-01-22 ENCOUNTER — Ambulatory Visit (HOSPITAL_COMMUNITY)
Admission: RE | Admit: 2014-01-22 | Discharge: 2014-01-22 | Disposition: A | Discharge status: Home / Self Care | Payer: Medicaid Other | Source: Ambulatory Visit | Attending: Oncology | Admitting: Oncology

## 2014-01-22 ENCOUNTER — Other Ambulatory Visit (HOSPITAL_BASED_OUTPATIENT_CLINIC_OR_DEPARTMENT_OTHER): Payer: Medicaid Other

## 2014-01-22 DIAGNOSIS — Z853 Personal history of malignant neoplasm of breast: Secondary | ICD-10-CM | POA: Diagnosis not present

## 2014-01-22 DIAGNOSIS — C50412 Malignant neoplasm of upper-outer quadrant of left female breast: Secondary | ICD-10-CM

## 2014-01-22 DIAGNOSIS — C773 Secondary and unspecified malignant neoplasm of axilla and upper limb lymph nodes: Principal | ICD-10-CM

## 2014-01-22 DIAGNOSIS — C50919 Malignant neoplasm of unspecified site of unspecified female breast: Secondary | ICD-10-CM

## 2014-01-22 DIAGNOSIS — C787 Secondary malignant neoplasm of liver and intrahepatic bile duct: Secondary | ICD-10-CM

## 2014-01-22 DIAGNOSIS — Z95828 Presence of other vascular implants and grafts: Secondary | ICD-10-CM

## 2014-01-22 DIAGNOSIS — C50912 Malignant neoplasm of unspecified site of left female breast: Secondary | ICD-10-CM

## 2014-01-22 DIAGNOSIS — R918 Other nonspecific abnormal finding of lung field: Secondary | ICD-10-CM | POA: Diagnosis not present

## 2014-01-22 LAB — COMPREHENSIVE METABOLIC PANEL (CC13)
ALT: 12 U/L (ref 0–55)
ANION GAP: 7 meq/L (ref 3–11)
AST: 20 U/L (ref 5–34)
Albumin: 3.7 g/dL (ref 3.5–5.0)
Alkaline Phosphatase: 61 U/L (ref 40–150)
BILIRUBIN TOTAL: 0.41 mg/dL (ref 0.20–1.20)
BUN: 11.4 mg/dL (ref 7.0–26.0)
CHLORIDE: 102 meq/L (ref 98–109)
CO2: 26 meq/L (ref 22–29)
Calcium: 8.9 mg/dL (ref 8.4–10.4)
Creatinine: 0.8 mg/dL (ref 0.6–1.1)
Glucose: 100 mg/dl (ref 70–140)
Potassium: 4 mEq/L (ref 3.5–5.1)
SODIUM: 136 meq/L (ref 136–145)
Total Protein: 7.3 g/dL (ref 6.4–8.3)

## 2014-01-22 LAB — CBC WITH DIFFERENTIAL/PLATELET
BASO%: 0.3 % (ref 0.0–2.0)
Basophils Absolute: 0 10*3/uL (ref 0.0–0.1)
EOS%: 2.2 % (ref 0.0–7.0)
Eosinophils Absolute: 0.1 10*3/uL (ref 0.0–0.5)
HCT: 34.7 % — ABNORMAL LOW (ref 34.8–46.6)
HGB: 11.7 g/dL (ref 11.6–15.9)
LYMPH#: 0.9 10*3/uL (ref 0.9–3.3)
LYMPH%: 28.5 % (ref 14.0–49.7)
MCH: 30.1 pg (ref 25.1–34.0)
MCHC: 33.7 g/dL (ref 31.5–36.0)
MCV: 89.2 fL (ref 79.5–101.0)
MONO#: 0.3 10*3/uL (ref 0.1–0.9)
MONO%: 10.5 % (ref 0.0–14.0)
NEUT#: 1.9 10*3/uL (ref 1.5–6.5)
NEUT%: 58.5 % (ref 38.4–76.8)
Platelets: 286 10*3/uL (ref 145–400)
RBC: 3.89 10*6/uL (ref 3.70–5.45)
RDW: 14.5 % (ref 11.2–14.5)
WBC: 3.2 10*3/uL — AB (ref 3.9–10.3)

## 2014-01-22 MED ORDER — SODIUM CHLORIDE 0.9 % IJ SOLN
10.0000 mL | INTRAMUSCULAR | Status: DC | PRN
  Administered 2014-01-22: 10 mL via INTRAVENOUS
  Filled 2014-01-22: qty 10

## 2014-01-22 MED ORDER — HEPARIN SOD (PORK) LOCK FLUSH 100 UNIT/ML IV SOLN
500.0000 [IU] | Freq: Once | INTRAVENOUS | Status: AC
  Administered 2014-01-22: 500 [IU] via INTRAVENOUS
  Filled 2014-01-22: qty 5

## 2014-01-22 NOTE — Patient Instructions (Signed)

## 2014-01-28 ENCOUNTER — Telehealth: Payer: Self-pay | Admitting: Oncology

## 2014-01-28 ENCOUNTER — Ambulatory Visit (HOSPITAL_BASED_OUTPATIENT_CLINIC_OR_DEPARTMENT_OTHER): Payer: Medicaid Other | Admitting: Oncology

## 2014-01-28 VITALS — BP 123/84 | HR 70 | Temp 97.6°F | Resp 18 | Ht 65.0 in | Wt 194.8 lb

## 2014-01-28 DIAGNOSIS — C50412 Malignant neoplasm of upper-outer quadrant of left female breast: Secondary | ICD-10-CM

## 2014-01-28 NOTE — Telephone Encounter (Signed)
per pof to sch pt appt-gave ptt copy of sch-adv CS will sch PET & CT

## 2014-01-28 NOTE — Progress Notes (Signed)
[facility]  [phone] Fax:(336) [phone]  OFFICE PROGRESS NOTE     ID: Alyssa May   DOB: 11/30/1965  MR#: [id number]  XBD#:[id number]   PCP:No PCP Per Patient SU: Donnie Mesa, MD RAD ONC: Eppie Gibson, MD; Thea Silversmith M.D., Stevphen Rochester MD  CHIEF COMPLAINT: Metastatic breast cancer  CURRENT TREATMENT: Observation  BREAST CANCER HISTORY: From the original intake note:  Alyssa May is a 48 y.o. [place] woman who first noted a small mass in her left breast 01/2010, shortly after she stopped breast-feeding.  It did not disappear with resumption of her periods and grew over the next year, and particularly since she had her miscarrriage in 12/2010. This brought her to the Emergency Room 03/09/2011 where she was found to have a fluctuant area in the UOQ of the left breast measuring 8.4 cm. The tissue beneath the cystic area extending towards the axilla and medially was described as firm, but not erythematous. The cystic area was lanced and a large amount of serosanguinos material was expressed. The patient was referred to Dr. Verita Lamb who saw her 03/11/2011. By this time the fluid had largely reaccumulated. He incised the lesion, again draining serosanguinous liquid, packed it, and set the patient up for left breast US at the [facility] 03/14/2011. This showed an irregularly marginated mass measuring 7.3 cm, with abnormal axillary adenopathy. On 01/1102013 bilateral mammography confirmed a high-density mass in the left UOQ. This was biopsied, as was a left axillary lymph node. Both biopsies ([id number]) showed invasive ductal carcinoma, high grade, triple negative, with an Mib-1 of 97%.  Her subsequent history is as detailed below.  INTERVAL HISTORY: Ranata returns today for followup of her metastatic breast cancer accompanied by her mother. Clinically the interval history is generally unremarkable. Socially she had her family have moved to her  grandmother's home in [place], which is a smaller house. Doristine has been moving a lot of boxes and trying to make room for things. Her husband Romona Curls is now working in for a Teacher, early years/pre in [employer]. Their daughter is doing great and school and anticipating going to medical school (she is currently 46 years old). Since her last visit here also [place] developed chickenpox. She still feels a little itchy from that. She was treated through a local emergency room with antivirals  ROS: Jamie still has some hot flashes, but her periods are back. She understands she might get pregnant but she had Leroy Sea are not using any contraceptives at this point. Sometimes she complains of blurred vision, which is very transient. She is deconditioned, and can be short of breath particularly when walking up stairs. She has a little bit of a cold, with hoarseness and a mild dry cough, but no pleurisy or purulent sputum. She denies nausea, vomiting, or altered taste. She has an excellent appetite and does not like how much weight she has gained. A detailed review of systems today was otherwise stable  PAST MEDICAL HISTORY: Past Medical History  Diagnosis Date  . Breast abscess 03/2011  . History of bronchitis     last time OCt-Nov 2012  . Seizures     as a toddler ,but none since then.  . Joint pain   . History of blood transfusion     in May 2013-no abnormal reaction noted  . Cancer     Left Breast  . Breast cancer     left  . History of radiation therapy 04/10/13-06/03/13    left  breast/chest wall,supraclavicular    PAST SURGICAL HISTORY: Past Surgical History  Procedure Laterality Date  . Dilation and curettage of uterus  01/04/11  . Breast mass excision  2013  . Tonsillectomy    . Portacath placement  04/12/2011    Procedure: INSERTION PORT-A-CATH;  Surgeon: Imogene Burn. Tsuei, MD;  Location: [facility];  Service: General;  Laterality: Right;  right subclavian  . Incision and drainage breast abscess  03/31/11  .  Mastectomy modified radical Left 02/27/2013    Procedure: MASTECTOMY MODIFIED RADICAL;  Surgeon: Haywood Lasso, MD;  Location: [facility];  Service: General;  Laterality: Left;  . Breast surgery      FAMILY HISTORY Family History  Problem Relation Age of Onset  . Diabetes Mother   . Hypertension Mother   [name] died from post-operative complications at age 49; the patient's mother is 36, lives in [place] The patient is an only child.   GYNECOLOGIC HISTORY: First pregnancy to term age 35. Her menstrual cycles were interrupted with chemotherapy, but resumed in 05/2012.  She has not had a menses since her menstrual cycle in 05/2012.   SOCIAL HISTORY: She is a Research scientist (medical), living in [place] but originally from the [place] area. She has a good deal of family in this area. Her husband Aleaha Fickling worked as a Geophysicist/field seismologist for a Art therapist in [place] but currently he is working for [employer] as a Barrister's clerk. . Their daughter, [name],  will start kindergarten August 2015      ADVANCED DIRECTIVES: Not in place   HEALTH MAINTENANCE: History  Substance Use Topics  . Smoking status: Never Smoker   . Smokeless tobacco: Never Used  . Alcohol Use: Yes     Comment: occasionally wine     Colonoscopy:  N/A  PAP: 03/18/2011  Bone density: N/A  Lipid panel: Not on file   Allergies  Allergen Reactions  . Sulfa Antibiotics Swelling    Current Outpatient Prescriptions  Medication Sig Dispense Refill  . acyclovir (ZOVIRAX) 400 MG tablet Take 2 tablets (800 mg total) by mouth 5 (five) times daily. 70 tablet 0  . cholecalciferol (VITAMIN D) 1000 UNITS tablet Take 2,000 Units by mouth every morning.    . diphenhydrAMINE (BENYLIN) 12.5 MG/5ML syrup Take 37.5 mg by mouth 4 (four) times daily as needed for allergies (allergic reation).     . ferrous sulfate 325 (65 FE) MG tablet Take 325 mg by mouth daily with breakfast.    . hydrOXYzine (ATARAX/VISTARIL) 25 MG tablet Take 1  tablet (25 mg total) by mouth every 6 (six) hours. 12 tablet 0  . Multiple Vitamins-Minerals (MULTIVITAMIN WITH MINERALS) tablet Take 1 tablet by mouth daily.     No current facility-administered medications for this visit.   Objective: Middle-aged [place] American woman in no acute distress  Filed Vitals:   01/28/14 1503  BP: 123/84  Pulse: 70  Temp: 97.6 F (36.4 C)  Resp: 18     Body mass index is 32.42 kg/(m^2).    ECOG FS: 0 - Asymptomatic   Filed Weights   01/28/14 1503  Weight: 194 lb 12.8 oz (88.361 kg)    Sclerae unicteric, pupils round and equal Oropharynx clear, teeth in good repair No cervical or supraclavicular adenopathy Lungs no rales or rhonchi Heart regular rate and rhythm Abd soft, obese, nontender, positive bowel sounds MSK no focal spinal tenderness, no upper extremity lymphedema Neuro: nonfocal, well oriented, positive affect Breasts: The right breast is  unremarkable. The left breast is status post mastectomy and radiation. There is no evidence of local recurrence. The left axilla is benign.   LAB RESULTS: Lab Results  Component Value Date   WBC 3.2* 01/22/2014   NEUTROABS 1.9 01/22/2014   HGB 11.7 01/22/2014   HCT 34.7* 01/22/2014   MCV 89.2 01/22/2014   PLT 286 01/22/2014       Chemistry      Component Value Date/Time   NA 136 01/22/2014 1431   NA 136 03/01/2013 0423   K 4.0 01/22/2014 1431   K 4.0 03/01/2013 0423   CL 100 03/01/2013 0423   CL 103 07/05/2012 1448   CO2 26 01/22/2014 1431   CO2 29 03/01/2013 0423   BUN 11.4 01/22/2014 1431   BUN 3* 03/01/2013 0423   CREATININE 0.8 01/22/2014 1431   CREATININE 0.74 03/01/2013 0423      Component Value Date/Time   CALCIUM 8.9 01/22/2014 1431   CALCIUM 7.4* 03/01/2013 0423   ALKPHOS 61 01/22/2014 1431   ALKPHOS 68 02/26/2013 1701   AST 20 01/22/2014 1431   AST 26 02/26/2013 1701   ALT 12 01/22/2014 1431   ALT 10 02/26/2013 1701   BILITOT 0.41 01/22/2014 1431   BILITOT 0.2*  02/26/2013 1701       STUDIES: Dg Chest 2 View  01/22/2014   CLINICAL DATA:  48 year old female with stage IV breast cancer status post left mastectomy, finished chemotherapy. Subsequent encounter.  EXAM: CHEST  2 VIEW  COMPARISON:  Chest CT 09/16/2013 and earlier.  FINDINGS: Stable right chest porta cath. Postoperative changes to the left chest wall and axilla. Normal cardiac size and mediastinal contours. Lung volumes remain normal. No pneumothorax, pulmonary edema, pleural effusion or consolidation. Scoliosis. Stable visualized osseous structures.  Subtle mildly nodular opacity in the left upper lobe in apex, corresponding to areas of peripheral irregular opacity in July, and likely regressed since that time. No other pulmonary nodule identified.  IMPRESSION: 1. Vague opacity in the left upper lobe probably this sequelae of the distal nodular process demonstrated by CT on 09/16/2013. This appears regressed favoring an infectious or inflammatory process. Attention directed on restaging CT. 2. No new cardiopulmonary abnormality identified.   Electronically Signed   By: Lars Pinks M.D.   On: 01/22/2014 14:49    ASSESSMENT: [name] is a 48 y.o.  [place], [place] woman with stage IV breast cancer (liver involvement at presentation but no bone, lung or brain spread):  (1)  Status post upper outer quadrant left breast biopsy 03/18/2011 showing a clinical T4,N1 invasive ductal carcinoma, high grade, triple negative, with an Mib-1 of 97%, with a single liver metastasis pathologically confirmed 03/31/2011.  (2) Status post 4 cycles of Q three-week docetaxel/ doxorubicin/ cyclophosphamide in the neoadjuvant setting, discontinued after 4 cycles due to peripheral neuropathy.  Status post 2 cycles of every 3 week carboplatin/ gemcitabine, for a total of 6 neoadjuvant cycles altogether, completed 11/09/2011.  (3) The patient refused surgery.  (4) With subsequent tumor re-growth, started  Carboplatin/ Gemcitabine on 09/10/2012, with treatment on days 1 and 8 of each 21 day cycle, received six cycles, last dose 01/28/2013; response was moderate  (5) status post left modified radical mastectomy 02/27/2013 for a residual pT4b pN2a invasive ductal carcinoma, repeat prognostic panel again triple negative, with an MIB-1 of 90%.  (6) recurrence at left mastectomy wound excised 04/02/2013  (6) radiation therapy to left chest wall and regional drainage nodes started 04/10/2013 with capecitabine sensitization (  1000 mg po BID on treatment days), completed 06/03/2013  (7) the patient is interested in proceeding with reconstruction  (8) restaging liver MRI shows only a 3 mm lesion consistent with scar   PLAN:  Alleen Borne looks remarkably well and clinically there is no evidence of active disease. She understands that with a history of stage IV breast cancer we expect there to be some cancer somewhere in her body and of course what where he says the most is certain liver. At this point it would be prudent to restage her and I am setting her up for a PET scan and liver MRI within the next few weeks. In particular this will clarify the abnormalities we are seeing in her chest x-ray.  If those results are favorable she will simply return to see me 3 months from now. However if we have evidence of active disease we will consider further chemotherapy or possibly, if we are dealing only with a very limited area in the liver, consideration of local treatment there (resection versus possibly chemoembolization).  Her plans for reconstruction are not yet finalized and she is concerned that she may lose her Medicaid in the near future.  Alleen Borne has a good understanding of the overall plan. She agrees with it. She knows to call for any problems that may develop before the next visit here.   Chauncey Cruel, MD   01/28/2014 3:29 PM

## 2014-02-05 ENCOUNTER — Telehealth: Payer: Self-pay | Admitting: *Deleted

## 2014-02-05 ENCOUNTER — Other Ambulatory Visit: Payer: Self-pay | Admitting: *Deleted

## 2014-02-05 NOTE — Addendum Note (Signed)
Addended by: Laureen Abrahams on: 02/05/2014 07:02 PM   Modules accepted: Orders, Medications

## 2014-02-05 NOTE — Telephone Encounter (Signed)
This RN spoke with Lewie Loron with [employer] to give additional clinical data for PET and MRI of liver. Per Vivien Rota case had been closed due to denial.  Note per review corrections were made including  Pt is triple negative with known liver mets   Insurance had pt's surgery as liver resection - per discussion revised as liver biopsy showing metastatic disease.  Request for scans were marked as " surveillance " and corrected per call to " evaluate known mets to liver as well as to assess for additional disease for treatment decision for local liver ablation or systemic therapy for disease progression "  Per Vivien Rota case has been reopened and " reconsideration may take up to 5 business days "  Vaughan Basta in managed care informed of above.

## 2014-02-07 ENCOUNTER — Encounter (HOSPITAL_COMMUNITY): Payer: Medicaid Other

## 2014-02-12 ENCOUNTER — Other Ambulatory Visit: Payer: Self-pay | Admitting: Oncology

## 2014-02-12 ENCOUNTER — Ambulatory Visit (HOSPITAL_COMMUNITY)
Admission: RE | Admit: 2014-02-12 | Discharge: 2014-02-12 | Disposition: A | Discharge status: Home / Self Care | Payer: Medicaid Other | Source: Ambulatory Visit | Attending: Oncology | Admitting: Oncology

## 2014-02-12 ENCOUNTER — Telehealth: Payer: Self-pay | Admitting: Oncology

## 2014-02-12 ENCOUNTER — Other Ambulatory Visit: Payer: Self-pay | Admitting: *Deleted

## 2014-02-12 DIAGNOSIS — C787 Secondary malignant neoplasm of liver and intrahepatic bile duct: Secondary | ICD-10-CM

## 2014-02-12 DIAGNOSIS — C50819 Malignant neoplasm of overlapping sites of unspecified female breast: Secondary | ICD-10-CM | POA: Diagnosis not present

## 2014-02-12 DIAGNOSIS — C773 Secondary and unspecified malignant neoplasm of axilla and upper limb lymph nodes: Secondary | ICD-10-CM

## 2014-02-12 DIAGNOSIS — C50919 Malignant neoplasm of unspecified site of unspecified female breast: Secondary | ICD-10-CM

## 2014-02-12 DIAGNOSIS — C50412 Malignant neoplasm of upper-outer quadrant of left female breast: Secondary | ICD-10-CM

## 2014-02-12 DIAGNOSIS — G893 Neoplasm related pain (acute) (chronic): Secondary | ICD-10-CM

## 2014-02-12 DIAGNOSIS — Z9012 Acquired absence of left breast and nipple: Secondary | ICD-10-CM | POA: Diagnosis not present

## 2014-02-12 MED ORDER — IOHEXOL 300 MG/ML  SOLN
100.0000 mL | Freq: Once | INTRAMUSCULAR | Status: AC | PRN
  Administered 2014-02-12: 100 mL via INTRAVENOUS

## 2014-02-13 ENCOUNTER — Ambulatory Visit (HOSPITAL_COMMUNITY): Admission: RE | Admit: 2014-02-13 | Payer: Medicaid Other | Source: Ambulatory Visit

## 2014-03-04 ENCOUNTER — Ambulatory Visit: Payer: Medicaid Other | Admitting: Oncology

## 2014-03-04 NOTE — Progress Notes (Signed)
No show

## 2014-04-28 ENCOUNTER — Telehealth: Payer: Self-pay | Admitting: Oncology

## 2014-04-28 ENCOUNTER — Telehealth: Payer: Self-pay | Admitting: *Deleted

## 2014-04-28 ENCOUNTER — Encounter: Payer: Self-pay | Admitting: Oncology

## 2014-04-28 DIAGNOSIS — C773 Secondary and unspecified malignant neoplasm of axilla and upper limb lymph nodes: Secondary | ICD-10-CM

## 2014-04-28 DIAGNOSIS — C50412 Malignant neoplasm of upper-outer quadrant of left female breast: Secondary | ICD-10-CM

## 2014-04-28 DIAGNOSIS — C787 Secondary malignant neoplasm of liver and intrahepatic bile duct: Secondary | ICD-10-CM

## 2014-04-28 DIAGNOSIS — C50919 Malignant neoplasm of unspecified site of unspecified female breast: Secondary | ICD-10-CM

## 2014-04-28 NOTE — Telephone Encounter (Signed)
PT. STATES MEDICAID OK'D HER MRI. THE MRI NEEDS TO BE DONE BEFORE PT.'S VISIT WITH DR.MAGRINAT. THIS NOTE WAS SENT TO DR.MAGRINAT'S NURSE, VAL DODD,RN.

## 2014-04-28 NOTE — Telephone Encounter (Signed)
This RN received communication that pt has called to rescheduled [place] appointment per missed date of 03/04/2014.  Per communication pt is also requesting MRI to be rescheduled.  This RN will obtain order for above and request appointment for follow up.

## 2014-04-28 NOTE — Addendum Note (Signed)
Addended by: Laureen Abrahams on: 04/28/2014 03:07 PM   Modules accepted: Orders

## 2014-04-28 NOTE — Telephone Encounter (Signed)
lvm for pt regarding to March appt....advised pt that she will get another call from radiology....mailed pt appt sched and letter

## 2014-04-28 NOTE — Telephone Encounter (Addendum)
Information forwarded to Dr. Jana Hakim. Note carried to Dr. Virgie Dad pod.

## 2014-05-05 ENCOUNTER — Other Ambulatory Visit: Payer: Self-pay | Admitting: *Deleted

## 2014-05-06 ENCOUNTER — Ambulatory Visit (HOSPITAL_COMMUNITY)
Admission: RE | Admit: 2014-05-06 | Discharge: 2014-05-06 | Disposition: A | Discharge status: Home / Self Care | Payer: Medicaid Other | Source: Ambulatory Visit | Attending: Oncology | Admitting: Oncology

## 2014-05-06 ENCOUNTER — Ambulatory Visit (HOSPITAL_BASED_OUTPATIENT_CLINIC_OR_DEPARTMENT_OTHER): Payer: Medicaid Other | Admitting: Nurse Practitioner

## 2014-05-06 ENCOUNTER — Other Ambulatory Visit: Payer: Self-pay | Admitting: *Deleted

## 2014-05-06 ENCOUNTER — Ambulatory Visit (HOSPITAL_BASED_OUTPATIENT_CLINIC_OR_DEPARTMENT_OTHER): Payer: Medicaid Other

## 2014-05-06 ENCOUNTER — Ambulatory Visit (HOSPITAL_COMMUNITY): Payer: Medicaid Other

## 2014-05-06 VITALS — BP 119/56 | HR 80 | Temp 98.4°F | Resp 16 | Wt 194.0 lb

## 2014-05-06 DIAGNOSIS — C50412 Malignant neoplasm of upper-outer quadrant of left female breast: Secondary | ICD-10-CM

## 2014-05-06 DIAGNOSIS — C787 Secondary malignant neoplasm of liver and intrahepatic bile duct: Secondary | ICD-10-CM

## 2014-05-06 DIAGNOSIS — F418 Other specified anxiety disorders: Secondary | ICD-10-CM

## 2014-05-06 DIAGNOSIS — Z923 Personal history of irradiation: Secondary | ICD-10-CM | POA: Insufficient documentation

## 2014-05-06 DIAGNOSIS — R0600 Dyspnea, unspecified: Secondary | ICD-10-CM

## 2014-05-06 DIAGNOSIS — C50919 Malignant neoplasm of unspecified site of unspecified female breast: Secondary | ICD-10-CM

## 2014-05-06 DIAGNOSIS — R0602 Shortness of breath: Secondary | ICD-10-CM | POA: Insufficient documentation

## 2014-05-06 DIAGNOSIS — F32A Depression, unspecified: Secondary | ICD-10-CM

## 2014-05-06 DIAGNOSIS — F329 Major depressive disorder, single episode, unspecified: Secondary | ICD-10-CM

## 2014-05-06 DIAGNOSIS — Z9012 Acquired absence of left breast and nipple: Secondary | ICD-10-CM | POA: Diagnosis not present

## 2014-05-06 LAB — COMPREHENSIVE METABOLIC PANEL (CC13)
ALT: 12 U/L (ref 0–55)
AST: 23 U/L (ref 5–34)
Albumin: 3.9 g/dL (ref 3.5–5.0)
Alkaline Phosphatase: 55 U/L (ref 40–150)
Anion Gap: 12 mEq/L — ABNORMAL HIGH (ref 3–11)
BILIRUBIN TOTAL: 0.56 mg/dL (ref 0.20–1.20)
BUN: 11.3 mg/dL (ref 7.0–26.0)
CO2: 27 mEq/L (ref 22–29)
Calcium: 9 mg/dL (ref 8.4–10.4)
Chloride: 103 mEq/L (ref 98–109)
Creatinine: 0.9 mg/dL (ref 0.6–1.1)
EGFR: 88 mL/min/{1.73_m2} — ABNORMAL LOW (ref >90)
GLUCOSE: 105 mg/dL (ref 70–140)
Potassium: 4.2 mEq/L (ref 3.5–5.1)
Sodium: 142 mEq/L (ref 136–145)
Total Protein: 7.7 g/dL (ref 6.4–8.3)

## 2014-05-06 LAB — CBC WITH DIFFERENTIAL/PLATELET
BASO%: 0.4 % (ref 0.0–2.0)
Basophils Absolute: 0 10*3/uL (ref 0.0–0.1)
EOS%: 2.6 % (ref 0.0–7.0)
Eosinophils Absolute: 0.1 10*3/uL (ref 0.0–0.5)
HCT: 39 % (ref 34.8–46.6)
HGB: 12.8 g/dL (ref 11.6–15.9)
LYMPH#: 1 10*3/uL (ref 0.9–3.3)
LYMPH%: 25.4 % (ref 14.0–49.7)
MCH: 29.8 pg (ref 25.1–34.0)
MCHC: 32.9 g/dL (ref 31.5–36.0)
MCV: 90.6 fL (ref 79.5–101.0)
MONO#: 0.3 10*3/uL (ref 0.1–0.9)
MONO%: 8.7 % (ref 0.0–14.0)
NEUT#: 2.5 10*3/uL (ref 1.5–6.5)
NEUT%: 62.9 % (ref 38.4–76.8)
Platelets: 316 10*3/uL (ref 145–400)
RBC: 4.31 10*6/uL (ref 3.70–5.45)
RDW: 13.6 % (ref 11.2–14.5)
WBC: 3.9 10*3/uL (ref 3.9–10.3)

## 2014-05-06 LAB — LACTATE DEHYDROGENASE (CC13): LDH: 235 U/L (ref 125–245)

## 2014-05-07 ENCOUNTER — Ambulatory Visit (HOSPITAL_COMMUNITY): Payer: Medicaid Other

## 2014-05-07 ENCOUNTER — Encounter: Payer: Self-pay | Admitting: Nurse Practitioner

## 2014-05-07 DIAGNOSIS — F329 Major depressive disorder, single episode, unspecified: Secondary | ICD-10-CM | POA: Insufficient documentation

## 2014-05-07 DIAGNOSIS — F32A Depression, unspecified: Secondary | ICD-10-CM | POA: Insufficient documentation

## 2014-05-07 DIAGNOSIS — R0600 Dyspnea, unspecified: Secondary | ICD-10-CM | POA: Insufficient documentation

## 2014-05-07 NOTE — Assessment & Plan Note (Signed)
Patient completed carboplatin/gemcitabine chemotherapy in November 2014.  She completed radiation therapy in March 2015.  She has had issues with obtaining both her MRI and PET scans for restaging purposes; secondary to Medicaid/insurance issues.  It appears that the Medicaid refusal to cover these restaging scans has been appealed twice in the past; with no change in the decision regarding the scans.  Labs obtained today completely within normal limits; including normal LDH.  After long discussion with the patient today-decision was made for patient to follow back up with Dr. Jana Hakim next week on 05/14/2014 for further labs and to discuss restaging options.

## 2014-05-07 NOTE — Progress Notes (Signed)
SYMPTOM MANAGEMENT CLINIC   HPI: Alyssa May 49 y.o. female diagnosed with rest cancer; with liver metastasis.  Patient is status post left mastectomy in 2014.  Patient is also status post carboplatin/gemcitabine chemotherapy; as well as radiation therapy.   Patient called the cancer Center today requesting urgent care visit.  Patient is complaining of some occasional shortness of breath.  She states that she feels somewhat depressed and at times very anxious.  She feels that the occasional shortness of breath may very well be related to anxiety.  Patient denies any chest pain, chest pressure, shortness breath, or pain with inspiration at present.  She also denies any recent cough or other URI symptoms.  She denies any recent fevers or chills.  Patient was scheduled for both a restaging MRI and PET scan approximately one month ago; but has been having some difficulty with Medicaid/insurance covering the restaging scans.  Patient states that this decision has been appealed to different times; with no success.  HPI  ROS  Past Medical History  Diagnosis Date  . Breast abscess 03/2011  . History of bronchitis     last time OCt-Nov 2012  . Seizures     as a toddler ,but none since then.  . Joint pain   . History of blood transfusion     in May 2013-no abnormal reaction noted  . Cancer     Left Breast  . Breast cancer     left  . History of radiation therapy 04/10/13-06/03/13    left breast/chest wall,supraclavicular    Past Surgical History  Procedure Laterality Date  . Dilation and curettage of uterus  01/04/11  . Breast mass excision  2013  . Tonsillectomy    . Portacath placement  04/12/2011    Procedure: INSERTION PORT-A-CATH;  Surgeon: Imogene Burn. Tsuei, [place];  Location: [facility];  Service: General;  Laterality: Right;  right subclavian  . Incision and drainage breast abscess  03/31/11  . Mastectomy modified radical Left 02/27/2013    Procedure: MASTECTOMY MODIFIED RADICAL;   Surgeon: Haywood Lasso, [place];  Location: [facility];  Service: General;  Laterality: Left;  . 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    is allergic to sulfa antibiotics.    Medication List       This list is accurate as of: 05/06/14 11:59 PM.  Always use your most recent med list.               cholecalciferol 1000 UNITS tablet  Commonly known as:  VITAMIN D  Take 2,000 Units by mouth every morning.         PHYSICAL EXAMINATION  Blood pressure 119/56, pulse 80, temperature 98.4 F (36.9 C), resp. rate 16, weight 194 lb (87.998 kg), SpO2 100 %.  Physical Exam  Constitutional: She is oriented to person, place, and time and well-developed, well-nourished, and in no distress.  HENT:  Head: Normocephalic and atraumatic.  Mouth/Throat: Oropharynx is clear and moist.  Eyes: Conjunctivae and EOM are normal. Pupils are equal, round, and reactive to light. Right eye exhibits no discharge. Left eye exhibits no discharge. No scleral icterus.  Neck: Normal range of motion. Neck supple. No JVD present. No tracheal deviation present. No thyromegaly present.  Cardiovascular: Normal rate, regular rhythm, normal heart sounds and intact distal pulses.   Pulmonary/Chest: Effort normal and breath sounds normal. No respiratory distress. She has no wheezes. She has no rales. She exhibits no tenderness.  Abdominal: Soft. Bowel sounds are normal. She exhibits no distension and no mass. There is no tenderness. There is no rebound and no guarding.  Musculoskeletal: Normal range of motion. She exhibits no edema or tenderness.  Lymphadenopathy:    She has no cervical adenopathy.  Neurological: She is alert and oriented  to person, place, and time. Gait normal.  Skin: Skin is warm and dry. No rash noted. No erythema. No pallor.  Psychiatric: Affect normal.  Nursing note and vitals reviewed.   LABORATORY DATA:. Appointment on 05/06/2014  Component Date Value Ref Range Status  . WBC 05/06/2014 3.9  3.9 - 10.3 10e3/uL Final  . NEUT# 05/06/2014 2.5  1.5 - 6.5 10e3/uL Final  . HGB 05/06/2014 12.8  11.6 - 15.9 g/dL Final  . HCT 05/06/2014 39.0  34.8 - 46.6 % Final  . Platelets 05/06/2014 316  145 - 400 10e3/uL Final  . MCV 05/06/2014 90.6  79.5 - 101.0 fL Final  . MCH 05/06/2014 29.8  25.1 - 34.0 pg Final  . MCHC 05/06/2014 32.9  31.5 - 36.0 g/dL Final  . RBC 05/06/2014 4.31  3.70 - 5.45 10e6/uL Final  . RDW 05/06/2014 13.6  11.2 - 14.5 % Final  . lymph# 05/06/2014 1.0  0.9 - 3.3 10e3/uL Final  . MONO# 05/06/2014 0.3  0.1 - 0.9 10e3/uL Final  . Eosinophils Absolute 05/06/2014 0.1  0.0 - 0.5 10e3/uL Final  . Basophils Absolute 05/06/2014 0.0  0.0 - 0.1 10e3/uL Final  . NEUT% 05/06/2014 62.9  38.4 - 76.8 % Final  . LYMPH% 05/06/2014 25.4  14.0 - 49.7 % Final  . MONO% 05/06/2014 8.7  0.0 - 14.0 % Final  . EOS% 05/06/2014 2.6  0.0 - 7.0 % Final  . BASO% 05/06/2014 0.4  0.0 - 2.0 % Final  . Sodium 05/06/2014 142  136 - 145 mEq/L Final  . Potassium 05/06/2014 4.2  3.5 - 5.1 mEq/L Final  . Chloride 05/06/2014 103  98 - 109 mEq/L Final  . CO2 05/06/2014 27  22 - 29 mEq/L Final  . Glucose 05/06/2014 105  70 - 140 mg/dl Final  . BUN 05/06/2014 11.3  7.0 - 26.0 mg/dL Final  . Creatinine 05/06/2014 0.9  0.6 - 1.1 mg/dL Final  . Total Bilirubin 05/06/2014 0.56  0.20 - 1.20 mg/dL Final  . Alkaline Phosphatase 05/06/2014 55  40 - 150 U/L Final  . AST 05/06/2014 23  5 - 34 U/L Final  . ALT 05/06/2014 12  0 - 55 U/L Final  . Total Protein 05/06/2014 7.7  6.4 - 8.3 g/dL Final  . Albumin 05/06/2014 3.9  3.5 - 5.0 g/dL Final  . Calcium 05/06/2014 9.0  8.4 - 10.4 mg/dL Final  . Anion Gap 05/06/2014 12* 3 - 11 mEq/L  Final  . EGFR 05/06/2014 88* >90 ml/min/1.73 m2 Final   eGFR is calculated using the CKD-EPI Creatinine Equation (2009)  . LDH 05/06/2014 235  125 - 245 U/L Final     RADIOGRAPHIC STUDIES: Dg Chest 2 View  05/06/2014   CLINICAL DATA:  Shortness of breath. Post radiation therapy. History of breast carcinoma with prior left mastectomy  EXAM: CHEST  2 VIEW  COMPARISON:  Chest radiograph January 22, 2014 and chest CT February 12, 2014  FINDINGS:  Post radiation therapy change in the left upper lobe remains. There is no edema or consolidation. Heart size and pulmonary vascularity are normal. Port-A-Cath tip is at the level of the cavoatrial junction. No pneumothorax. Patient is status post left mastectomy with surgical clips in left axillary region. There are no blastic or lytic bone lesions.  IMPRESSION: Radiation therapy change on the left. No new opacity. No frank edema or consolidation. No change in cardiac silhouette.   Electronically Signed   By: Lowella Grip III M.D.   On: 05/06/2014 14:16    ASSESSMENT/PLAN:    Breast cancer of upper-outer quadrant of left female breast Patient completed carboplatin/gemcitabine chemotherapy in November 2014.  She completed radiation therapy in March 2015.  She has had issues with obtaining both her MRI and PET scans for restaging purposes; secondary to Medicaid/insurance issues.  It appears that the Medicaid refusal to cover these restaging scans has been appealed twice in the past; with no change in the decision regarding the scans.  Labs obtained today completely within normal limits; including normal LDH.  After long discussion with the patient today-decision was made for patient to follow back up with Dr. Jana Hakim next week on 05/14/2014 for further labs and to discuss restaging options.   Depression Patient is complaining of feeling increasingly fatigued; and somewhat depressed.  She states that dealing with her cancer diagnosis and subsequent  treatment has caused a great deal of worry and anxiety to her.  She states that she does occasionally become short of breath; and feels that this may also be related to her anxiety.  At present-patient denies any chest pain, chest pressure, shortness of breath, or pain with inspiration.  Discussed the option of trying some low-dose antidepressant; the patient states that she prefers to think about this until her visit with Dr. Jana Hakim next week.  Advised patient would order a social work referral for her to speak to a Education officer, museum regarding both her depression and anxiety issues as well.  Patient was in agreement with this plan.   Dyspnea Patient is complaining of some occasional dyspnea; but denies any active chest pain, chest pressure, shortness breath or pain with inspiration.  She feels that the shortness of breath may very well be related to her depression and anxiety issues.  Chest x-ray obtained today reveal no acute findings.  On exam-patient with clear breath sounds bilaterally; with no wheeze, no cough, and no acute respiratory distress.  Occasional shortness of breath could very well be related to anxiety; and discussed at length ways to calm her anxiety.  Patient is also considering the initiation of a mild antidepressant; but states that she will wait until she sees Dr. Jana Hakim next week to make her decision.  Have also ordered a social work referral as well.   Patient stated understanding of all instructions; and was in agreement with this plan of care. The patient knows to call the clinic with any problems, questions or concerns.   Review/collaboration with Dr. Jana Hakim regarding all aspects of patient's visit today.   Total time spent with patient was 40 minutes;  with greater than 75 percent of that time spent in face to face counseling regarding patient's symptoms,  and coordination of care and follow up.  Disclaimer: This note was dictated with voice recognition software.  Similar sounding words can inadvertently be transcribed and may not be corrected upon review.   Drue Second, NP 05/07/2014

## 2014-05-07 NOTE — Assessment & Plan Note (Signed)
Patient is complaining of feeling increasingly fatigued; and somewhat depressed.  She states that dealing with her cancer diagnosis and subsequent treatment has caused a great deal of worry and anxiety to her.  She states that she does occasionally become short of breath; and feels that this may also be related to her anxiety.  At present-patient denies any chest pain, chest pressure, shortness of breath, or pain with inspiration.  Discussed the option of trying some low-dose antidepressant; the patient states that she prefers to think about this until her visit with Dr. Jana Hakim next week.  Advised patient would order a social work referral for her to speak to a Education officer, museum regarding both her depression and anxiety issues as well.  Patient was in agreement with this plan.

## 2014-05-07 NOTE — Assessment & Plan Note (Signed)
Patient is complaining of some occasional dyspnea; but denies any active chest pain, chest pressure, shortness breath or pain with inspiration.  She feels that the shortness of breath may very well be related to her depression and anxiety issues.  Chest x-ray obtained today reveal no acute findings.  On exam-patient with clear breath sounds bilaterally; with no wheeze, no cough, and no acute respiratory distress.  Occasional shortness of breath could very well be related to anxiety; and discussed at length ways to calm her anxiety.  Patient is also considering the initiation of a mild antidepressant; but states that she will wait until she sees Dr. Jana Hakim next week to make her decision.  Have also ordered a social work referral as well.

## 2014-05-14 ENCOUNTER — Ambulatory Visit (HOSPITAL_BASED_OUTPATIENT_CLINIC_OR_DEPARTMENT_OTHER): Payer: Medicaid Other

## 2014-05-14 ENCOUNTER — Telehealth: Payer: Self-pay | Admitting: Oncology

## 2014-05-14 ENCOUNTER — Ambulatory Visit (HOSPITAL_BASED_OUTPATIENT_CLINIC_OR_DEPARTMENT_OTHER): Payer: Medicaid Other | Admitting: Oncology

## 2014-05-14 ENCOUNTER — Ambulatory Visit: Payer: Medicaid Other

## 2014-05-14 VITALS — BP 128/91 | HR 102 | Temp 100.4°F | Resp 18 | Ht 65.0 in | Wt 187.2 lb

## 2014-05-14 DIAGNOSIS — Z95828 Presence of other vascular implants and grafts: Secondary | ICD-10-CM

## 2014-05-14 DIAGNOSIS — G629 Polyneuropathy, unspecified: Secondary | ICD-10-CM

## 2014-05-14 DIAGNOSIS — C787 Secondary malignant neoplasm of liver and intrahepatic bile duct: Secondary | ICD-10-CM

## 2014-05-14 DIAGNOSIS — C50412 Malignant neoplasm of upper-outer quadrant of left female breast: Secondary | ICD-10-CM

## 2014-05-14 DIAGNOSIS — J069 Acute upper respiratory infection, unspecified: Secondary | ICD-10-CM

## 2014-05-14 DIAGNOSIS — C773 Secondary and unspecified malignant neoplasm of axilla and upper limb lymph nodes: Secondary | ICD-10-CM

## 2014-05-14 DIAGNOSIS — F418 Other specified anxiety disorders: Secondary | ICD-10-CM

## 2014-05-14 DIAGNOSIS — C50919 Malignant neoplasm of unspecified site of unspecified female breast: Secondary | ICD-10-CM

## 2014-05-14 MED ORDER — SODIUM CHLORIDE 0.9 % IJ SOLN
10.0000 mL | INTRAMUSCULAR | Status: DC | PRN
  Administered 2014-05-14: 10 mL via INTRAVENOUS
  Filled 2014-05-14: qty 10

## 2014-05-14 MED ORDER — HEPARIN SOD (PORK) LOCK FLUSH 100 UNIT/ML IV SOLN
500.0000 [IU] | Freq: Once | INTRAVENOUS | Status: AC
  Administered 2014-05-14: 500 [IU] via INTRAVENOUS
  Filled 2014-05-14: qty 5

## 2014-05-14 NOTE — Telephone Encounter (Signed)
per pof to sch pt appt-gave pt copy of sch-sent back to lab per pof

## 2014-05-14 NOTE — Patient Instructions (Signed)

## 2014-05-14 NOTE — Progress Notes (Signed)
[name]  Telephone:(336) [phone] Fax:(336) [phone]  OFFICE PROGRESS NOTE     ID: Alyssa May   DOB: May 03, 1965  MR#: [id number]  EXH#:[id number]   PCP:No PCP Per Patient SU: Alyssa Mesa, MD RAD ONC: Eppie Gibson, MD; Thea Silversmith M.D., Stevphen Rochester MD  CHIEF COMPLAINT: Metastatic breast cancer  CURRENT TREATMENT: Observation  BREAST CANCER HISTORY: From the original intake note:  Alyssa May is a 49 y.o. [place] woman who first noted a small mass in her left breast 01/2010, shortly after she stopped breast-feeding.  It did not disappear with resumption of her periods and grew over the next year, and particularly since she had her miscarrriage in 12/2010. This brought her to the Emergency Room 03/09/2011 where she was found to have a fluctuant area in the UOQ of the left breast measuring 8.4 cm. The tissue beneath the cystic area extending towards the axilla and medially was described as firm, but not erythematous. The cystic area was lanced and a large amount of serosanguinos material was expressed. The patient was referred to Dr. Verita Lamb who saw her 03/11/2011. By this time the fluid had largely reaccumulated. He incised the lesion, again draining serosanguinous liquid, packed it, and set the patient up for left breast US at the [facility] 03/14/2011. This showed an irregularly marginated mass measuring 7.3 cm, with abnormal axillary adenopathy. On 01/1102013 bilateral mammography confirmed a high-density mass in the left UOQ. This was biopsied, as was a left axillary lymph node. Both biopsies ([id number]) showed invasive ductal carcinoma, high grade, triple negative, with an Mib-1 of 97%.  Her subsequent history is as detailed below.  INTERVAL HISTORY: Alyssa May returns today for followup of her metastatic breast cancer.  She was supposed to have had restaging studies, but has not been able to afford those and apparently her insurance has been  refusing them she did see [place] last week with an upper respiratory infection and a chest x-ray was obtained which did not show pneumonia. It did show some scarring from her earlier radiation. The white cell count was not elevated. She tells me her daughter was the one who brought this home and everybody at home now has had the same illness.  ROS:  aside from the respiratory issues, she feels moderately depressed. She is working hard to lose the weight she gained earlier.  She gets fatigued easily. Her appetite is poor she says. She still has some peripheral neuropathy symptoms. A detailed review of systems was otherwise noncontributory  PAST MEDICAL HISTORY: Past Medical History  Diagnosis Date  . Breast abscess 03/2011  . History of bronchitis     last time OCt-Nov 2012  . Seizures     as a toddler ,but none since then.  . Joint pain   . History of blood transfusion     in May 2013-no abnormal reaction noted  . Cancer     Left Breast  . Breast cancer     left  . History of radiation therapy 04/10/13-06/03/13    left breast/chest wall,supraclavicular    PAST SURGICAL HISTORY: Past Surgical History  Procedure Laterality Date  . Dilation and curettage of uterus  01/04/11  . Breast mass excision  2013  . Tonsillectomy    . Portacath placement  04/12/2011    Procedure: INSERTION PORT-A-CATH;  Surgeon: Imogene Burn. Tsuei, MD;  Location: [facility];  Service: General;  Laterality: Right;  right subclavian  . Incision and drainage breast abscess  03/31/11  .  Mastectomy modified radical Left 02/27/2013    Procedure: MASTECTOMY MODIFIED RADICAL;  Surgeon: Haywood Lasso, MD;  Location: [facility];  Service: General;  Laterality: Left;  . Breast surgery      FAMILY HISTORY Family History  Problem Relation Age of Onset  . Diabetes Mother   . Hypertension Mother   [name] died from post-operative complications at age 44; the patient's mother is 62, lives in [place] The patient is  an only child.   GYNECOLOGIC HISTORY: First pregnancy to term age 17. Her menstrual cycles were interrupted with chemotherapy, but resumed in 05/2012.  She has not had a menses since her menstrual cycle in 05/2012.   SOCIAL HISTORY: She is a Research scientist (medical), living in [place] but originally from the [place]. area. She has a good deal of family in this area. Her husband Alyssa May worked as a Geophysicist/field seismologist for a Art therapist in [place] but currently he is working for [employer] as a Barrister's clerk. . Their daughter, Alyssa May,  will start kindergarten August 2015      ADVANCED DIRECTIVES: Not in place   HEALTH MAINTENANCE: History  Substance Use Topics  . Smoking status: Never Smoker   . Smokeless tobacco: Never Used  . Alcohol Use: Yes     Comment: occasionally wine     Colonoscopy:  N/A  PAP: 03/18/2011  Bone density: N/A  Lipid panel: Not on file   Allergies  Allergen Reactions  . Sulfa Antibiotics Swelling    Current Outpatient Prescriptions  Medication Sig Dispense Refill  . cholecalciferol (VITAMIN D) 1000 UNITS tablet Take 2,000 Units by mouth every morning.     No current facility-administered medications for this visit.   Objective: Middle-aged [place] American woman we'll appears acutely ill  Filed Vitals:   05/14/14 1027  BP: 128/91  Pulse: 102  Temp: 100.4 F (38 C)  Resp: 18     Body mass index is 31.15 kg/(m^2).    ECOG FS: 2 - Symptomatic, <50% confined to bed   Filed Weights   05/14/14 1027  Weight: 187 lb 3.2 oz (84.913 kg)    Sclerae unicteric,  EOMs intact Oropharynx clear,  No thrush or exudates noted No cervical or supraclavicular adenopathy Lungs no rales or rhonchi , good excursion bilaterally Heart regular rate and rhythm Abd soft, obese, nontender, positive bowel sounds MSK no focal spinal tenderness, no upper extremity lymphedema Neuro: nonfocal, well oriented,  appropriateaffect Breasts: The right breast is unremarkable. The left breast is  status post mastectomy and radiation. There is no evidence of local recurrence. The left axilla is benign.   LAB RESULTS: Lab Results  Component Value Date   WBC 3.9 05/06/2014   NEUTROABS 2.5 05/06/2014   HGB 12.8 05/06/2014   HCT 39.0 05/06/2014   MCV 90.6 05/06/2014   PLT 316 05/06/2014       Chemistry      Component Value Date/Time   NA 142 05/06/2014 1102   NA 136 03/01/2013 0423   K 4.2 05/06/2014 1102   K 4.0 03/01/2013 0423   CL 100 03/01/2013 0423   CL 103 07/05/2012 1448   CO2 27 05/06/2014 1102   CO2 29 03/01/2013 0423   BUN 11.3 05/06/2014 1102   BUN 3* 03/01/2013 0423   CREATININE 0.9 05/06/2014 1102   CREATININE 0.74 03/01/2013 0423      Component Value Date/Time   CALCIUM 9.0 05/06/2014 1102   CALCIUM 7.4* 03/01/2013 0423   ALKPHOS 55  05/06/2014 1102   ALKPHOS 68 02/26/2013 1701   AST 23 05/06/2014 1102   AST 26 02/26/2013 1701   ALT 12 05/06/2014 1102   ALT 10 02/26/2013 1701   BILITOT 0.56 05/06/2014 1102   BILITOT 0.2* 02/26/2013 1701       STUDIES: Dg Chest 2 View  05/06/2014   CLINICAL DATA:  Shortness of breath. Post radiation therapy. History of breast carcinoma with prior left mastectomy  EXAM: CHEST  2 VIEW  COMPARISON:  Chest radiograph January 22, 2014 and chest CT February 12, 2014  FINDINGS: Post radiation therapy change in the left upper lobe remains. There is no edema or consolidation. Heart size and pulmonary vascularity are normal. Port-A-Cath tip is at the level of the cavoatrial junction. No pneumothorax. Patient is status post left mastectomy with surgical clips in left axillary region. There are no blastic or lytic bone lesions.  IMPRESSION: Radiation therapy change on the left. No new opacity. No frank edema or consolidation. No change in cardiac silhouette.   Electronically Signed   By: Lowella Grip III M.D.   On: 05/06/2014 14:16    ASSESSMENT: Alyssa May is a 49 y.o.  [place], [place] woman with stage IV breast  cancer (liver involvement at presentation but no bone, lung or brain spread):  (1)  Status post upper outer quadrant left breast biopsy 03/18/2011 showing a clinical T4,N1 invasive ductal carcinoma, high grade, triple negative, with an Mib-1 of 97%, with a single liver metastasis pathologically confirmed 03/31/2011.  (2) Status post 4 cycles of Q three-week docetaxel/ doxorubicin/ cyclophosphamide in the neoadjuvant setting, discontinued after 4 cycles due to peripheral neuropathy.  Status post 2 cycles of every 3 week carboplatin/ gemcitabine, for a total of 6 neoadjuvant cycles altogether, completed 11/09/2011.  (3) The patient refused surgery.  (4) With subsequent tumor re-growth, started Carboplatin/ Gemcitabine on 09/10/2012, with treatment on days 1 and 8 of each 21 day cycle, received six cycles, last dose 01/28/2013; response was moderate  (5) status post left modified radical mastectomy 02/27/2013 for a residual pT4b pN2a invasive ductal carcinoma, repeat prognostic panel again triple negative, with an MIB-1 of 90%.  (6) recurrence at left mastectomy wound excised 04/02/2013  (6) radiation therapy to left chest wall and regional drainage nodes started 04/10/2013 with capecitabine sensitization (1000 mg po BID on treatment days), completed 06/03/2013  (7) the patient is interested in proceeding with reconstruction  (8) restaging liver MRI shows only a 3 mm lesion consistent with scar   PLAN:  Alyssa May is suffering from an acute upper respiratory infection but she does not have pneumonia by chest x-ray. She very likely does have a viral illness which is denuding her airways and causing her to cough.  This puts her at risk for bacterial rhonchi to his and she is having low-grade fever. Just in case I am covering her with a Z-Pak.  She will call she is not feeling better within a week or so.   Because of insurance issues it has not been possible to get her restaged as we originally planned.  I am putting her in for an ultrasound of the abdomen to we can keep an eye on the liver. Chest wall however looks quite good today.   She will see [place] again in 2 months, but of course she will call with any problems that may develop before that visit. Incidentally she needs to have her port flushed on a regular basis and we are getting that  accomplished today  Chauncey Cruel, MD   05/14/2014 10:57 AM

## 2014-05-21 ENCOUNTER — Ambulatory Visit (HOSPITAL_COMMUNITY)
Admission: RE | Admit: 2014-05-21 | Discharge: 2014-05-21 | Disposition: A | Discharge status: Home / Self Care | Payer: Medicaid Other | Source: Ambulatory Visit | Attending: Oncology | Admitting: Oncology

## 2014-05-21 DIAGNOSIS — C50919 Malignant neoplasm of unspecified site of unspecified female breast: Secondary | ICD-10-CM | POA: Insufficient documentation

## 2014-05-21 DIAGNOSIS — C50412 Malignant neoplasm of upper-outer quadrant of left female breast: Secondary | ICD-10-CM

## 2014-05-21 DIAGNOSIS — C773 Secondary and unspecified malignant neoplasm of axilla and upper limb lymph nodes: Secondary | ICD-10-CM

## 2014-07-15 ENCOUNTER — Other Ambulatory Visit: Payer: Self-pay | Admitting: *Deleted

## 2014-07-15 ENCOUNTER — Telehealth: Payer: Self-pay | Admitting: Adult Health

## 2014-07-15 ENCOUNTER — Encounter: Payer: Self-pay | Admitting: Adult Health

## 2014-07-15 DIAGNOSIS — C50919 Malignant neoplasm of unspecified site of unspecified female breast: Secondary | ICD-10-CM

## 2014-07-15 DIAGNOSIS — C787 Secondary malignant neoplasm of liver and intrahepatic bile duct: Secondary | ICD-10-CM

## 2014-07-15 DIAGNOSIS — C50412 Malignant neoplasm of upper-outer quadrant of left female breast: Secondary | ICD-10-CM

## 2014-07-15 NOTE — Telephone Encounter (Signed)
I spoke with [name] regarding her interest in making an appointment in the Survivorship Clinic.  She was referred by a friend who was recently seen in my clinic.    I thanked [name] for her call and explained to her the purpose and current guidelines of the Survivorship Clinic.  Based on [name]'s history, I encouraged her to continue to see her physician oncology specialists who follow her closely for her metastatic breast cancer.  At this time, seeing a patient with metastatic disease in survivorship is beyond our scope. I did offer to mail [name] some of the survivorship education/resources we have, and she expressed gratitude for that.    However, I did talk to her about her concerns and how I could potentially help.  She expressed to me that she has been feeling very fatigued and depressed.  She recently enrolled in the [facility] for survivors and this has helped her quite a bit.  She also tells me that she is very concerned about her daughter and her daughter's recent illnesses/missed days from school.  [name] is fearful that her daughter will no longer be able to attend her magnet school and this is causing her great stress.  She has called Polo Riley, LCSW here at the cancer center for help and support with this.    I commended her on her efforts with [employer] and encouraged her to keep up the good work.  I told her that I would speak with [name] directly to explain the patient's concerns and [name] could follow-up with the patient, as appropriate.    I will also mail her some survivorship resources for both herself and her daughter (including an activity book for children of parents with cancer and a "[employer]ids [employer]" brochure).    [name] expressed appreciation for this information and support.  I encouraged her to reach out again at any time and I would be more than happy to help her as needed.   Mike Craze, NP [place] [phone]

## 2014-07-15 NOTE — Progress Notes (Signed)
Survivorship resources for the patient and her daughter were mailed today (based on our phone conversation dated 07/15/14).  Ms. Sterbenz knows to contact me with any questions or concerns.   Mike Craze, NP [place] [phone]

## 2014-07-16 ENCOUNTER — Telehealth: Payer: Self-pay | Admitting: *Deleted

## 2014-07-16 ENCOUNTER — Telehealth: Payer: Self-pay | Admitting: Nurse Practitioner

## 2014-07-16 ENCOUNTER — Ambulatory Visit: Payer: Self-pay | Admitting: Nurse Practitioner

## 2014-07-16 ENCOUNTER — Other Ambulatory Visit: Payer: Self-pay

## 2014-07-16 NOTE — Telephone Encounter (Signed)
Spoke with patient per [facility] as patient missed her appointment today,she has a new number [phone]

## 2014-07-16 NOTE — Telephone Encounter (Signed)
Called to inquire why pt didn't come to appt today. Pt said she was not aware that she had an appt. I told pt we will r/s this appt. Our scheduler will call her with new appt. Message to be forwarded to Gentry Fitz, NP.

## 2014-07-23 ENCOUNTER — Ambulatory Visit (HOSPITAL_BASED_OUTPATIENT_CLINIC_OR_DEPARTMENT_OTHER): Payer: Medicaid Other | Admitting: Nurse Practitioner

## 2014-07-23 ENCOUNTER — Telehealth: Payer: Self-pay | Admitting: Nurse Practitioner

## 2014-07-23 ENCOUNTER — Ambulatory Visit (HOSPITAL_BASED_OUTPATIENT_CLINIC_OR_DEPARTMENT_OTHER): Payer: Medicaid Other

## 2014-07-23 ENCOUNTER — Other Ambulatory Visit (HOSPITAL_BASED_OUTPATIENT_CLINIC_OR_DEPARTMENT_OTHER): Payer: Medicaid Other

## 2014-07-23 VITALS — BP 116/63 | HR 83 | Temp 98.2°F | Resp 18 | Ht 65.0 in | Wt 191.6 lb

## 2014-07-23 DIAGNOSIS — C50412 Malignant neoplasm of upper-outer quadrant of left female breast: Secondary | ICD-10-CM

## 2014-07-23 DIAGNOSIS — C787 Secondary malignant neoplasm of liver and intrahepatic bile duct: Secondary | ICD-10-CM

## 2014-07-23 DIAGNOSIS — Z95828 Presence of other vascular implants and grafts: Secondary | ICD-10-CM

## 2014-07-23 DIAGNOSIS — C50919 Malignant neoplasm of unspecified site of unspecified female breast: Secondary | ICD-10-CM

## 2014-07-23 DIAGNOSIS — C50912 Malignant neoplasm of unspecified site of left female breast: Secondary | ICD-10-CM

## 2014-07-23 LAB — CBC WITH DIFFERENTIAL/PLATELET
BASO%: 0.5 % (ref 0.0–2.0)
Basophils Absolute: 0 10*3/uL (ref 0.0–0.1)
EOS%: 2.4 % (ref 0.0–7.0)
Eosinophils Absolute: 0.1 10*3/uL (ref 0.0–0.5)
HCT: 35.2 % (ref 34.8–46.6)
HGB: 11.6 g/dL (ref 11.6–15.9)
LYMPH%: 33.5 % (ref 14.0–49.7)
MCH: 29.3 pg (ref 25.1–34.0)
MCHC: 32.9 g/dL (ref 31.5–36.0)
MCV: 89.2 fL (ref 79.5–101.0)
MONO#: 0.2 10*3/uL (ref 0.1–0.9)
MONO%: 8.2 % (ref 0.0–14.0)
NEUT#: 1.7 10*3/uL (ref 1.5–6.5)
NEUT%: 55.4 % (ref 38.4–76.8)
Platelets: 285 10*3/uL (ref 145–400)
RBC: 3.95 10*6/uL (ref 3.70–5.45)
RDW: 14.4 % (ref 11.2–14.5)
WBC: 3 10*3/uL — ABNORMAL LOW (ref 3.9–10.3)
lymph#: 1 10*3/uL (ref 0.9–3.3)

## 2014-07-23 LAB — COMPREHENSIVE METABOLIC PANEL (CC13)
ALBUMIN: 3.5 g/dL (ref 3.5–5.0)
ALK PHOS: 49 U/L (ref 40–150)
ALT: 17 U/L (ref 0–55)
AST: 26 U/L (ref 5–34)
Anion Gap: 10 mEq/L (ref 3–11)
BUN: 10.7 mg/dL (ref 7.0–26.0)
CO2: 26 mEq/L (ref 22–29)
Calcium: 8.4 mg/dL (ref 8.4–10.4)
Chloride: 105 mEq/L (ref 98–109)
Creatinine: 0.8 mg/dL (ref 0.6–1.1)
EGFR: >90 mL/min/{1.73_m2} (ref >90)
Glucose: 102 mg/dl (ref 70–140)
POTASSIUM: 3.8 meq/L (ref 3.5–5.1)
SODIUM: 140 meq/L (ref 136–145)
Total Bilirubin: 0.45 mg/dL (ref 0.20–1.20)
Total Protein: 6.8 g/dL (ref 6.4–8.3)

## 2014-07-23 MED ORDER — SODIUM CHLORIDE 0.9 % IJ SOLN
10.0000 mL | INTRAMUSCULAR | Status: DC | PRN
  Administered 2014-07-23: 10 mL via INTRAVENOUS
  Filled 2014-07-23: qty 10

## 2014-07-23 MED ORDER — HEPARIN SOD (PORK) LOCK FLUSH 100 UNIT/ML IV SOLN
500.0000 [IU] | Freq: Once | INTRAVENOUS | Status: AC
  Administered 2014-07-23: 500 [IU] via INTRAVENOUS
  Filled 2014-07-23: qty 5

## 2014-07-23 NOTE — Telephone Encounter (Signed)
Appointments made and avs printed for patient °

## 2014-07-23 NOTE — Patient Instructions (Signed)

## 2014-07-23 NOTE — Progress Notes (Signed)
[facility]  [phone] [phone]  OFFICE PROGRESS NOTE     ID: Iliana Hutt Senna   DOB: 10/10/1965  MR#: [id number]  YTK#:[id number]   PCP:No PCP Per Patient SU: Donnie Mesa, MD RAD ONC: Eppie Gibson, MD; Thea Silversmith M.D., Stevphen Rochester MD  CHIEF COMPLAINT: Metastatic breast cancer  CURRENT TREATMENT: Observation  BREAST CANCER HISTORY: From the original intake note:  Anel Creighton is a 49 y.o. [place] woman who first noted a small mass in her left breast 01/2010, shortly after she stopped breast-feeding.  It did not disappear with resumption of her periods and grew over the next year, and particularly since she had her miscarrriage in 12/2010. This brought her to the Emergency Room 03/09/2011 where she was found to have a fluctuant area in the UOQ of the left breast measuring 8.4 cm. The tissue beneath the cystic area extending towards the axilla and medially was described as firm, but not erythematous. The cystic area was lanced and a large amount of serosanguinos material was expressed. The patient was referred to Dr. Verita Lamb who saw her 03/11/2011. By this time the fluid had largely reaccumulated. He incised the lesion, again draining serosanguinous liquid, packed it, and set the patient up for left breast US at the [facility] 03/14/2011. This showed an irregularly marginated mass measuring 7.3 cm, with abnormal axillary adenopathy. On 01/1102013 bilateral mammography confirmed a high-density mass in the left UOQ. This was biopsied, as was a left axillary lymph node. Both biopsies ([id number]) showed invasive ductal carcinoma, high grade, triple negative, with an Mib-1 of 97%.  Her subsequent history is as detailed below.  INTERVAL HISTORY: Timmie returns today for follow up of her metastatic breast cancer. She had a abdominal ultrasound since the request for a PET scan was denied earlier this year. Since her last visit, not much has  changed. She got over the upper respiratory infection that was noted in March. She is now working with the [employer] as a Clinical research associate and this has significantly improved her mood. Her only issue is tightness and swelling to her left flank under the axilla. She assumes this was lymphedema. She wore a sleeve for a little while and the swelling went away.   REVIEW OF SYSTEMS: Mirel denies fevers, chills, nausea, vomiting, or changes in bowel or bladder habits. She has infrequent hot flashes. Her neuropathy has lightened. She has more energy since she has been more active and her appetite is improved. She denies shortness of breath, chest pain, cough, or palpitations. A detailed review of systems is otherwise stable.  PAST MEDICAL HISTORY: Past Medical History  Diagnosis Date  . Breast abscess 03/2011  . History of bronchitis     last time OCt-Nov 2012  . Seizures     as a toddler ,but none since then.  . Joint pain   . History of blood transfusion     in May 2013-no abnormal reaction noted  . Cancer     Left Breast  . Breast cancer     left  . History of radiation therapy 04/10/13-06/03/13    left breast/chest wall,supraclavicular    PAST SURGICAL HISTORY: Past Surgical History  Procedure Laterality Date  . Dilation and curettage of uterus  01/04/11  . Breast mass excision  2013  . Tonsillectomy    . Portacath placement  04/12/2011    Procedure: INSERTION PORT-A-CATH;  Surgeon: Imogene Burn. [name], MD;  Location: [facility];  Service: General;  Laterality:  Right;  right subclavian  . Incision and drainage breast abscess  03/31/11  . Mastectomy modified radical Left 02/27/2013    Procedure: MASTECTOMY MODIFIED RADICAL;  Surgeon: Haywood Lasso, MD;  Location: [facility];  Service: General;  Laterality: Left;  . Breast surgery      FAMILY HISTORY Family History  Problem Relation Age of Onset  . Diabetes Mother   . Hypertension Mother   [name] died from post-operative  complications at age 69; the patient's mother is 9, lives in [place] The patient is an only child.   GYNECOLOGIC HISTORY: First pregnancy to term age 37. Her menstrual cycles were interrupted with chemotherapy, but resumed in 05/2012.  She has not had a menses since her menstrual cycle in 05/2012.   SOCIAL HISTORY: She is a Research scientist (medical), living in [place] but originally from the [place]. area. She has a good deal of family in this area. Her husband Kentley Cedillo worked as a Geophysicist/field seismologist for a Art therapist in [place] but currently he is working for [employer] as a Barrister's clerk. . Their daughter, [name],  will start kindergarten August 2015      ADVANCED DIRECTIVES: Not in place   HEALTH MAINTENANCE: History  Substance Use Topics  . Smoking status: Never Smoker   . Smokeless tobacco: Never Used  . Alcohol Use: Yes     Comment: occasionally wine     Colonoscopy:  N/A  PAP: 03/18/2011  Bone density: N/A  Lipid panel: Not on file   Allergies  Allergen Reactions  . Sulfa Antibiotics Swelling    Current Outpatient Prescriptions  Medication Sig Dispense Refill  . Multiple Vitamins-Minerals (MULTI-VITAMIN GUMMIES PO) Take 4 tablets by mouth daily.    . cholecalciferol (VITAMIN D) 1000 UNITS tablet Take 2,000 Units by mouth every morning.     No current facility-administered medications for this visit.   Objective: Middle-aged [place] American woman we'll appears acutely ill  Filed Vitals:   07/23/14 1316  BP: 116/63  Pulse: 83  Temp: 98.2 F (36.8 C)  Resp: 18     Body mass index is 31.88 kg/(m^2).    ECOG FS: 2 - Symptomatic, <50% confined to bed   Filed Weights   07/23/14 1316  Weight: 191 lb 9.6 oz (86.909 kg)   Skin: warm, dry  HEENT: sclerae anicteric, conjunctivae pink, oropharynx clear. No thrush or mucositis.  Lymph Nodes: No cervical or supraclavicular lymphadenopathy  Lungs: clear to auscultation bilaterally, no rales, wheezes, or rhonci  Heart: regular rate  and rhythm  Abdomen: round, soft, non tender, positive bowel sounds  Musculoskeletal: No focal spinal tenderness, no peripheral edema  Neuro: non focal, well oriented, positive affect  Breasts: left breast status post mastectomy and radiation. No evidence of recurrent disease. Left axilla benign. Right breast unremarkable.  LAB RESULTS: Lab Results  Component Value Date   WBC 3.0* 07/23/2014   NEUTROABS 1.7 07/23/2014   HGB 11.6 07/23/2014   HCT 35.2 07/23/2014   MCV 89.2 07/23/2014   PLT 285 07/23/2014       Chemistry      Component Value Date/Time   NA 140 07/23/2014 1251   NA 136 03/01/2013 0423   K 3.8 07/23/2014 1251   K 4.0 03/01/2013 0423   CL 100 03/01/2013 0423   CL 103 07/05/2012 1448   CO2 26 07/23/2014 1251   CO2 29 03/01/2013 0423   BUN 10.7 07/23/2014 1251   BUN 3* 03/01/2013 0423   CREATININE  0.8 07/23/2014 1251   CREATININE 0.74 03/01/2013 0423      Component Value Date/Time   CALCIUM 8.4 07/23/2014 1251   CALCIUM 7.4* 03/01/2013 0423   ALKPHOS 49 07/23/2014 1251   ALKPHOS 68 02/26/2013 1701   AST 26 07/23/2014 1251   AST 26 02/26/2013 1701   ALT 17 07/23/2014 1251   ALT 10 02/26/2013 1701   BILITOT 0.45 07/23/2014 1251   BILITOT 0.2* 02/26/2013 1701       STUDIES: No results found.  EXAM: ULTRASOUND ABDOMEN COMPLETE  COMPARISON: 02/12/2014  FINDINGS: Gallbladder: Gallbladder wall is upper limits of normal in thickness measuring 3 mm. No gallstones or pericholecystic fluid. Negative sonographic Murphy's sign.  Common bile duct: Diameter: 3 mm  Liver: No focal lesion identified. Within normal limits in parenchymal echogenicity.  IVC: No abnormality visualized.  Pancreas: Visualized portion unremarkable.  Spleen: Size and appearance within normal limits.  Right Kidney: Length: 9.9 cm. Echogenicity within normal limits. No mass or hydronephrosis visualized.  Left Kidney: Length: 9.8 cm. Echogenicity within normal  limits. No mass or hydronephrosis visualized.  Abdominal aorta: No aneurysm visualized.  Other findings: None.  IMPRESSION: 1. The gallbladder wall is upper limits of normal in thickness measuring 3 mm. Examination is otherwise normal.  ASSESSMENT: [name] is a 49 y.o.  [place], [place] woman with stage IV breast cancer (liver involvement at presentation but no bone, lung or brain spread):  (1)  Status post upper outer quadrant left breast biopsy 03/18/2011 showing a clinical T4,N1 invasive ductal carcinoma, high grade, triple negative, with an Mib-1 of 97%, with a single liver metastasis pathologically confirmed 03/31/2011.  (2) Status post 4 cycles of Q three-week docetaxel/ doxorubicin/ cyclophosphamide in the neoadjuvant setting, discontinued after 4 cycles due to peripheral neuropathy.  Status post 2 cycles of every 3 week carboplatin/ gemcitabine, for a total of 6 neoadjuvant cycles altogether, completed 11/09/2011.  (3) The patient refused surgery.  (4) With subsequent tumor re-growth, started Carboplatin/ Gemcitabine on 09/10/2012, with treatment on days 1 and 8 of each 21 day cycle, received six cycles, last dose 01/28/2013; response was moderate  (5) status post left modified radical mastectomy 02/27/2013 for a residual pT4b pN2a invasive ductal carcinoma, repeat prognostic panel again triple negative, with an MIB-1 of 90%.  (6) recurrence at left mastectomy wound excised 04/02/2013  (6) radiation therapy to left chest wall and regional drainage nodes started 04/10/2013 with capecitabine sensitization (1000 mg po BID on treatment days), completed 06/03/2013  (7) the patient is interested in proceeding with reconstruction  (8) restaging liver MRI shows only a 3 mm lesion consistent with scar   PLAN:  Alleen Borne looks and feels well today. The labs were reviewed in detail and were entirely stable. The abdominal ultrasound performed in March was normal. No  evidence of metastatic disease present.   I recommended that while training she should be cautious with upper body work, especially on the left side where she feels weak. She meets with her plastic surgeon soon to remove the "dog ear" left over from her left mastectomy.   Alleen Borne will return in 4 months for labs and a follow up visit with Dr. Jana Hakim. I have placed orders for the PET scan that was denied earlier this year, to see if we might get clearance now. If this is again denied, I will let Dr. Jana Hakim decided the appropriate follow up. Alleen Borne understands and agrees with this plan. She has been encouraged to call with any issues  that might arise before her next visit here.   Laurie Panda, NP  07/23/2014 2:05 PM

## 2014-07-24 ENCOUNTER — Encounter: Payer: Self-pay | Admitting: Nurse Practitioner

## 2014-07-25 ENCOUNTER — Encounter: Payer: Self-pay | Admitting: *Deleted

## 2014-09-03 ENCOUNTER — Telehealth: Payer: Self-pay | Admitting: Oncology

## 2014-09-03 NOTE — Telephone Encounter (Signed)
Patient called to cancel her flush due to car issue and i have left her a message with a new appointment

## 2014-11-24 ENCOUNTER — Encounter (HOSPITAL_COMMUNITY): Payer: Self-pay | Admitting: Emergency Medicine

## 2014-11-24 ENCOUNTER — Emergency Department (INDEPENDENT_AMBULATORY_CARE_PROVIDER_SITE_OTHER)
Admission: EM | Admit: 2014-11-24 | Discharge: 2014-11-24 | Disposition: A | Discharge status: Home / Self Care | Payer: Self-pay | Source: Home / Self Care | Attending: Family Medicine | Admitting: Family Medicine

## 2014-11-24 DIAGNOSIS — L0291 Cutaneous abscess, unspecified: Secondary | ICD-10-CM

## 2014-11-24 DIAGNOSIS — L0390 Cellulitis, unspecified: Secondary | ICD-10-CM

## 2014-11-24 MED ORDER — CLINDAMYCIN HCL 300 MG PO CAPS: 300.0000 mg | ORAL_CAPSULE | Freq: Three times a day (TID) | ORAL | Status: DC

## 2014-11-24 MED ORDER — FLUCONAZOLE 150 MG PO TABS: 150.0000 mg | ORAL_TABLET | Freq: Every day | ORAL | Status: DC

## 2014-11-24 NOTE — Discharge Instructions (Signed)
You have developed a condition called cellulitis. This is caused from bacteria getting underneath the skin and spreading throughout the skin causing pain and irritation. The small abscess under leg was drained in our clinic. Please bandage and protect this area as needed for comfort or leave it open to air to allow to dry and heal. Please use the Diflucan if you develop a yeast infection.

## 2014-11-24 NOTE — ED Notes (Signed)
C/o insect bite on bilateral legs States bites itches and has clear discharge Benadryl and asa and vinegar used as tx

## 2014-11-24 NOTE — ED Provider Notes (Signed)
CSN: [id number]     Arrival date & time 11/24/14  1603 History   First MD Initiated Contact with Patient 11/24/14 1654     Chief Complaint  Patient presents with  . Insect Bite   (Consider location/radiation/quality/duration/timing/severity/associated sxs/prior Treatment) HPI   Patient states that partially 4 days ago she is lying in bed when she was bitten in multiple places on her legs. She thinks that this was from an aunt but is unsure. Over the next several days the bites became red and irritated. Patient has tried multiple different remedies including Benadryl cream, apple cider vinegar, rubbing alcohol without improvement. Left proximal 5 wound has become swollen and leaking fluid and very very painful. Denies any fevers, chest pain, shortness breath, palpitations, other rash. Gershon Mussel is constant and getting worse  Past Medical History  Diagnosis Date  . Breast abscess 03/2011  . History of bronchitis     last time OCt-Nov 2012  . Joint pain   . History of blood transfusion     in May 2013-no abnormal reaction noted  . Cancer     Left Breast  . Breast cancer     left  . History of radiation therapy 04/10/13-06/03/13    left breast/chest wall,supraclavicular   Past Surgical History  Procedure Laterality Date  . Dilation and curettage of uterus  01/04/11  . Breast mass excision  2013  . Tonsillectomy    . Portacath placement  04/12/2011    Procedure: INSERTION PORT-A-CATH;  Surgeon: Imogene Burn. Tsuei, MD;  Location: [facility];  Service: General;  Laterality: Right;  right subclavian  . Incision and drainage breast abscess  03/31/11  . Mastectomy modified radical Left 02/27/2013    Procedure: MASTECTOMY MODIFIED RADICAL;  Surgeon: Haywood Lasso, MD;  Location: [facility];  Service: General;  Laterality: Left;  . Breast surgery     Family History  Problem Relation Age of Onset  . Diabetes Mother   . Hypertension Mother    Social History  Substance Use Topics  . Smoking status:  Never Smoker   . Smokeless tobacco: Never Used  . Alcohol Use: Yes     Comment: occasionally wine   OB History    Gravida Para Term Preterm AB TAB SAB Ectopic Multiple Living   6    6 3 3   1      Review of Systems ,Per HPI with all other pertinent systems negative.   Allergies  Sulfa antibiotics  Home Medications   Prior to Admission medications   Medication Sig Start Date End Date Taking? Authorizing Provider  cholecalciferol (VITAMIN D) 1000 UNITS tablet Take 2,000 Units by mouth every morning.    Historical Provider, MD  clindamycin (CLEOCIN) 300 MG capsule Take 1 capsule (300 mg total) by mouth 3 (three) times daily. 11/24/14   Waldemar Dickens, MD  fluconazole (DIFLUCAN) 150 MG tablet Take 1 tablet (150 mg total) by mouth daily. Repeat dose in 3 days 11/24/14   Waldemar Dickens, MD  Multiple Vitamins-Minerals (MULTI-VITAMIN GUMMIES PO) Take 4 tablets by mouth daily.    Historical Provider, MD   Meds Ordered and Administered this Visit  Medications - No data to display  BP 83/51 mmHg  Pulse 87  Temp(Src) 98.6 F (37 C) (Oral)  Resp 16  SpO2 100% No data found.   Physical Exam Physical Exam  Constitutional: oriented to person, place, and time. appears well-developed and well-nourished. No distress.  HENT:  Head: Normocephalic and atraumatic.  Eyes: EOMI. PERRL.  Neck: Normal range of motion.  Cardiovascular: RRR, no m/r/g, 2+ distal pulses,  Pulmonary/Chest: Effort normal and breath sounds normal. No respiratory distress.  Abdominal: Soft. Bowel sounds are normal. NonTTP, no distension.  Musculoskeletal: Normal range of motion. Non ttp, no effusion.  Neurological: alert and oriented to person, place, and time.  Skin: 3 areas of erythema and induration on the lateral right proximal thigh, left popliteal fossa, and left medial thigh. Left medial thigh lesion also with 2 x 2 centimeter area of fluctuance with marked induration extending approximately 4 x 6.5 cm  circumferentially.  Psychiatric: normal mood and affect. behavior is normal. Judgment and thought content normal.   ED Course  Procedures (including critical care time)  Labs Review Labs Reviewed - No data to display  Imaging Review No results found.   Visual Acuity Review  Right Eye Distance:   Left Eye Distance:   Bilateral Distance:    Right Eye Near:   Left Eye Near:    Bilateral Near:          MDM   1. Cellulitis and abscess    Abscess drained using 18-gauge sterile needle after obtaining verbal consent from the patient. Serous sanguinous drainage with minimal purulence appreciated. Culture sent. Start clindamycin. Leg bandaged. Diflucan if develops yeast infection.    Waldemar Dickens, MD 11/24/14 [phone]

## 2014-11-25 ENCOUNTER — Other Ambulatory Visit: Payer: Self-pay | Admitting: *Deleted

## 2014-11-25 DIAGNOSIS — C50919 Malignant neoplasm of unspecified site of unspecified female breast: Secondary | ICD-10-CM

## 2014-11-25 DIAGNOSIS — C787 Secondary malignant neoplasm of liver and intrahepatic bile duct: Secondary | ICD-10-CM

## 2014-11-25 DIAGNOSIS — C50412 Malignant neoplasm of upper-outer quadrant of left female breast: Secondary | ICD-10-CM

## 2014-11-26 ENCOUNTER — Ambulatory Visit (HOSPITAL_BASED_OUTPATIENT_CLINIC_OR_DEPARTMENT_OTHER): Payer: Self-pay | Admitting: Oncology

## 2014-11-26 ENCOUNTER — Ambulatory Visit: Payer: Self-pay

## 2014-11-26 ENCOUNTER — Telehealth: Payer: Self-pay | Admitting: Oncology

## 2014-11-26 ENCOUNTER — Other Ambulatory Visit (HOSPITAL_BASED_OUTPATIENT_CLINIC_OR_DEPARTMENT_OTHER): Payer: Self-pay

## 2014-11-26 VITALS — BP 102/46 | HR 78 | Temp 98.0°F | Resp 18 | Ht 65.0 in | Wt 179.4 lb

## 2014-11-26 DIAGNOSIS — C50412 Malignant neoplasm of upper-outer quadrant of left female breast: Secondary | ICD-10-CM

## 2014-11-26 DIAGNOSIS — C787 Secondary malignant neoplasm of liver and intrahepatic bile duct: Secondary | ICD-10-CM

## 2014-11-26 DIAGNOSIS — C50919 Malignant neoplasm of unspecified site of unspecified female breast: Secondary | ICD-10-CM

## 2014-11-26 DIAGNOSIS — Z95828 Presence of other vascular implants and grafts: Secondary | ICD-10-CM

## 2014-11-26 LAB — CBC WITH DIFFERENTIAL/PLATELET
BASO%: 0.3 % (ref 0.0–2.0)
Basophils Absolute: 0 10*3/uL (ref 0.0–0.1)
EOS ABS: 0.2 10*3/uL (ref 0.0–0.5)
EOS%: 5.5 % (ref 0.0–7.0)
HCT: 35.9 % (ref 34.8–46.6)
HEMOGLOBIN: 12.2 g/dL (ref 11.6–15.9)
LYMPH%: 32.1 % (ref 14.0–49.7)
MCH: 30.3 pg (ref 25.1–34.0)
MCHC: 34 g/dL (ref 31.5–36.0)
MCV: 89.1 fL (ref 79.5–101.0)
MONO#: 0.3 10*3/uL (ref 0.1–0.9)
MONO%: 9.4 % (ref 0.0–14.0)
NEUT#: 1.7 10*3/uL (ref 1.5–6.5)
NEUT%: 52.7 % (ref 38.4–76.8)
PLATELETS: 267 10*3/uL (ref 145–400)
RBC: 4.03 10*6/uL (ref 3.70–5.45)
RDW: 13.9 % (ref 11.2–14.5)
WBC: 3.3 10*3/uL — AB (ref 3.9–10.3)
lymph#: 1.1 10*3/uL (ref 0.9–3.3)

## 2014-11-26 LAB — COMPREHENSIVE METABOLIC PANEL (CC13)
ALBUMIN: 3.8 g/dL (ref 3.5–5.0)
ALK PHOS: 52 U/L (ref 40–150)
ALT: 18 U/L (ref 0–55)
ANION GAP: 8 meq/L (ref 3–11)
AST: 25 U/L (ref 5–34)
BILIRUBIN TOTAL: 0.41 mg/dL (ref 0.20–1.20)
BUN: 15.7 mg/dL (ref 7.0–26.0)
CO2: 28 mEq/L (ref 22–29)
Calcium: 8.7 mg/dL (ref 8.4–10.4)
Chloride: 105 mEq/L (ref 98–109)
Creatinine: 0.8 mg/dL (ref 0.6–1.1)
Glucose: 95 mg/dl (ref 70–140)
Potassium: 4.2 mEq/L (ref 3.5–5.1)
Sodium: 140 mEq/L (ref 136–145)
TOTAL PROTEIN: 7.2 g/dL (ref 6.4–8.3)

## 2014-11-26 LAB — LACTATE DEHYDROGENASE (CC13): LDH: 218 U/L (ref 125–245)

## 2014-11-26 MED ORDER — HEPARIN SOD (PORK) LOCK FLUSH 100 UNIT/ML IV SOLN
500.0000 [IU] | Freq: Once | INTRAVENOUS | Status: AC
  Administered 2014-11-26: 500 [IU] via INTRAVENOUS
  Filled 2014-11-26: qty 5

## 2014-11-26 MED ORDER — SODIUM CHLORIDE 0.9 % IJ SOLN
10.0000 mL | INTRAMUSCULAR | Status: DC | PRN
  Administered 2014-11-26: 10 mL via INTRAVENOUS
  Filled 2014-11-26: qty 10

## 2014-11-26 NOTE — Telephone Encounter (Signed)
Appointments made and avs printed for patient °

## 2014-11-26 NOTE — Patient Instructions (Signed)

## 2014-11-26 NOTE — Progress Notes (Signed)
[facility]  [phone] [phone]  OFFICE PROGRESS NOTE     ID: Shavontae Gibeault Rautio   DOB: [birth year]  MR#: [id number]  ERX#:[id number]   PCP:No PCP Per Patient SU: Donnie Mesa, MD RAD ONC: Eppie Gibson, MD; Thea Silversmith M.D., Stevphen Rochester MD  CHIEF COMPLAINT: Metastatic breast cancer  CURRENT TREATMENT: Observation  BREAST CANCER HISTORY: From the original intake note:  Shahd Occhipinti is a 49 y.o. [place] woman who first noted a small mass in her left breast 01/2010, shortly after she stopped breast-feeding.  It did not disappear with resumption of her periods and grew over the next year, and particularly since she had her miscarrriage in 12/2010. This brought her to the Emergency Room 03/09/2011 where she was found to have a fluctuant area in the UOQ of the left breast measuring 8.4 cm. The tissue beneath the cystic area extending towards the axilla and medially was described as firm, but not erythematous. The cystic area was lanced and a large amount of serosanguinos material was expressed. The patient was referred to Dr. Verita Lamb who saw her 03/11/2011. By this time the fluid had largely reaccumulated. He incised the lesion, again draining serosanguinous liquid, packed it, and set the patient up for left breast US at the [facility] 03/14/2011. This showed an irregularly marginated mass measuring 7.3 cm, with abnormal axillary adenopathy. On 01/1102013 bilateral mammography confirmed a high-density mass in the left UOQ. This was biopsied, as was a left axillary lymph node. Both biopsies ([id number]) showed invasive ductal carcinoma, high grade, triple negative, with an Mib-1 of 97%.  Her subsequent history is as detailed below.  INTERVAL HISTORY: Edith returns today for follow up of her metastatic breast cancer.the interval history is remarkably benign. She has been exercising regularly, feels "stronger and healthier" than at the last  visit, and reports no symptoms referrable to her metastatic disease  REVIEW OF SYSTEMS: Ashawnti Feels moderately fatigued. She is able to get all her housework done but she is not otherwise working. She does make it to the gym a couple of times a week. She complains about blurred vision at times. This is not new, progressive, or persistent. She needs dental work. She feels a little forgetful and does not believe she never regained a cognitive abilities she had prior to treatment. She denies unusual headaches, nausea, vomiting, dizziness, or gait problems. She denies any cough, phlegm production, or pleurisy. There's been no change in bowel or bladder habits. A detailed review of systems is otherwise stable.  PAST MEDICAL HISTORY: Past Medical History  Diagnosis Date  . Breast abscess 03/2011  . History of bronchitis     last time OCt-Nov 2012  . Joint pain   . History of blood transfusion     in May 2013-no abnormal reaction noted  . Cancer     Left Breast  . Breast cancer     left  . History of radiation therapy 04/10/13-06/03/13    left breast/chest wall,supraclavicular    PAST SURGICAL HISTORY: Past Surgical History  Procedure Laterality Date  . Dilation and curettage of uterus  01/04/11  . Breast mass excision  2013  . Tonsillectomy    . Portacath placement  04/12/2011    Procedure: INSERTION PORT-A-CATH;  Surgeon: Imogene Burn. Tsuei, MD;  Location: [facility];  Service: General;  Laterality: Right;  right subclavian  . Incision and drainage breast abscess  03/31/11  . Mastectomy modified radical Left 02/27/2013    Procedure:  MASTECTOMY MODIFIED RADICAL;  Surgeon: Haywood Lasso, MD;  Location: [facility];  Service: General;  Laterality: Left;  . Breast surgery      FAMILY HISTORY Family History  Problem Relation Age of Onset  . Diabetes Mother   . Hypertension Mother   [name] died from post-operative complications at age 43; the patient's mother is 50, lives in [place] The patient is an only child.   GYNECOLOGIC HISTORY: First pregnancy to term age 16. Her menstrual cycles were interrupted with chemotherapy, but resumed in 05/2012.  She has not had a menses since her menstrual cycle in 05/2012.   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 She has a good deal of family in this area. Her husband Terecia Plaut worked as a Geophysicist/field seismologist for a Art therapist in [place] but currently he is working for [employer] as a Barrister's clerk. . Their daughter, [name], is in first grade (September 2016). The patient is a "a messianic Jew"and follows all the Jewish holidays.     ADVANCED DIRECTIVES: Not in place   HEALTH MAINTENANCE: Social History  Substance Use Topics  . Smoking status: Never Smoker   . Smokeless tobacco: Never Used  . Alcohol Use: Yes     Comment: occasionally wine     Colonoscopy:  N/A  PAP:   Bone density: N/A  Lipid panel:    Allergies  Allergen Reactions  . Sulfa Antibiotics Swelling    Current Outpatient Prescriptions  Medication Sig Dispense Refill  . cholecalciferol (VITAMIN D) 1000 UNITS tablet Take 2,000 Units by mouth every morning.    . clindamycin (CLEOCIN) 300 MG capsule Take 1 capsule (300 mg total) by mouth 3 (three) times daily. 21 capsule 0  . fluconazole (DIFLUCAN) 150 MG tablet Take 1 tablet (150 mg total) by mouth daily. Repeat dose in 3 days 2 tablet 0  . Multiple Vitamins-Minerals (MULTI-VITAMIN GUMMIES PO) Take 4 tablets by mouth daily.     No current facility-administered medications for this visit.   Objective: Middle-aged [place] American woman in no acute distress  Filed Vitals:   11/26/14 1123  BP: 102/46  Pulse: 78  Temp: 98 F (36.7 C)  Resp: 18     Body mass index is 29.85 kg/(m^2).    ECOG FS: 0 - Asymptomatic   Filed Weights   11/26/14 1123  Weight: 179 lb 6.4 oz (81.375 kg)   Sclerae unicteric, pupils round and equal, EOMs intact Oropharynx  clear and moist-- no thrush or other lesions No cervical or supraclavicular adenopathy Lungs no rales or rhonchi Heart regular rate and rhythm Abd soft, nontender, positive bowel sounds MSK no focal spinal tenderness, no left upper extremity lymphedema Neuro: nonfocal, well oriented, appropriate affect Breasts: the right breast is unremarkable. The left breast is status post mastectomy and radiation. There is residual hyperpigmentation. There is no evidence of local recurrence.   LAB RESULTS: Lab Results  Component Value Date   WBC 3.3* 11/26/2014   NEUTROABS 1.7 11/26/2014   HGB 12.2 11/26/2014   HCT 35.9 11/26/2014   MCV 89.1 11/26/2014   PLT 267 11/26/2014       Chemistry      Component Value Date/Time   NA 140 07/23/2014 1251   NA 136 03/01/2013 0423   K 3.8 07/23/2014 1251   K 4.0 03/01/2013 0423   CL 100 03/01/2013 0423   CL 103 07/05/2012 1448   CO2 26  07/23/2014 1251   CO2 29 03/01/2013 0423   BUN 10.7 07/23/2014 1251   BUN 3* 03/01/2013 0423   CREATININE 0.8 07/23/2014 1251   CREATININE 0.74 03/01/2013 0423      Component Value Date/Time   CALCIUM 8.4 07/23/2014 1251   CALCIUM 7.4* 03/01/2013 0423   ALKPHOS 49 07/23/2014 1251   ALKPHOS 68 02/26/2013 1701   AST 26 07/23/2014 1251   AST 26 02/26/2013 1701   ALT 17 07/23/2014 1251   ALT 10 02/26/2013 1701   BILITOT 0.45 07/23/2014 1251   BILITOT 0.2* 02/26/2013 1701       STUDIES: No results found.    ASSESSMENT: [name] is a 49 y.o.  [place], [place] woman with stage IV breast cancer (liver involvement at presentation but no bone, lung or brain spread):  (1)  Status post upper outer quadrant left breast biopsy 03/18/2011 showing a clinical T4,N1 invasive ductal carcinoma, high grade, triple negative, with an Mib-1 of 97%, with a single liver metastasis pathologically confirmed 03/31/2011.  (2) Status post 4 cycles of Q three-week docetaxel/ doxorubicin/ cyclophosphamide in the  neoadjuvant setting, discontinued after 4 cycles due to peripheral neuropathy.  Status post 2 cycles of every 3 week carboplatin/ gemcitabine, for a total of 6 neoadjuvant cycles altogether, completed 11/09/2011.  (3) The patient refused surgery.  (4) With subsequent tumor re-growth, started Carboplatin/ Gemcitabine on 09/10/2012, with treatment on days 1 and 8 of each 21 day cycle, received six cycles, last dose 01/28/2013; response was moderate  (5) status post left modified radical mastectomy 02/27/2013 for a residual pT4b pN2a invasive ductal carcinoma, repeat prognostic panel again triple negative, with an MIB-1 of 90%.  (6) recurrence at left mastectomy wound excised 04/02/2013  (6) radiation therapy to left chest wall and regional drainage nodes started 04/10/2013 with capecitabine sensitization (1000 mg po BID on treatment days), completed 06/03/2013  (7) the patient is interested in proceeding with reconstruction  (8) restaging liver MRI 07/13/2015shows only a 3 mm lesion consistent with scar   PLAN:  Alleen Borne looks terrific and gives me no symptoms suggestive of active disease. However she has stage IV breast cancer which is not curable. It is important to know if we have an area of activity because patients with oligo metastatic disease were treated aggressively sometimes can survive very prolonged periods.  We have had a great deal of difficulty trying to get a PET scan approved. I am going to start with an MRI of the liver and see if we can get that far.   She has many concerns that ideas regarding diet and we discussed those extensively today.  Otherwise we are continuing to flush her port every 6 weeks. She will see me again in mid December. She knows to call for any problems that may develop before that visit.    Chauncey Cruel, MD  11/26/2014 11:48 AM

## 2014-11-28 LAB — CULTURE, ROUTINE-ABSCESS
Culture: NO GROWTH
GRAM STAIN: NONE SEEN

## 2014-11-28 NOTE — ED Notes (Signed)
Final report shows no growth 

## 2014-12-15 ENCOUNTER — Ambulatory Visit (HOSPITAL_COMMUNITY): Payer: Self-pay

## 2014-12-19 ENCOUNTER — Ambulatory Visit (HOSPITAL_COMMUNITY)
Admission: RE | Admit: 2014-12-19 | Discharge: 2014-12-19 | Disposition: A | Discharge status: Home / Self Care | Payer: Self-pay | Source: Ambulatory Visit | Attending: Oncology | Admitting: Oncology

## 2014-12-19 ENCOUNTER — Other Ambulatory Visit: Payer: Self-pay | Admitting: Oncology

## 2014-12-19 DIAGNOSIS — C787 Secondary malignant neoplasm of liver and intrahepatic bile duct: Secondary | ICD-10-CM

## 2014-12-19 DIAGNOSIS — C50412 Malignant neoplasm of upper-outer quadrant of left female breast: Secondary | ICD-10-CM | POA: Insufficient documentation

## 2014-12-19 DIAGNOSIS — C50919 Malignant neoplasm of unspecified site of unspecified female breast: Secondary | ICD-10-CM

## 2014-12-19 MED ORDER — GADOBENATE DIMEGLUMINE 529 MG/ML IV SOLN
20.0000 mL | Freq: Once | INTRAVENOUS | Status: AC | PRN
  Administered 2014-12-19: 16 mL via INTRAVENOUS

## 2014-12-23 ENCOUNTER — Telehealth: Payer: Self-pay

## 2014-12-23 NOTE — Telephone Encounter (Signed)
Called patient per Dr. Jana Hakim to inform her that her MRI of the liver is clear of metastatic disease.  Patient happily stated understanding.  She has f/u in December.

## 2015-02-17 ENCOUNTER — Other Ambulatory Visit: Payer: Self-pay

## 2015-02-17 DIAGNOSIS — C50412 Malignant neoplasm of upper-outer quadrant of left female breast: Secondary | ICD-10-CM

## 2015-02-18 ENCOUNTER — Other Ambulatory Visit (HOSPITAL_BASED_OUTPATIENT_CLINIC_OR_DEPARTMENT_OTHER): Payer: Self-pay

## 2015-02-18 ENCOUNTER — Ambulatory Visit (HOSPITAL_BASED_OUTPATIENT_CLINIC_OR_DEPARTMENT_OTHER): Payer: Self-pay | Admitting: Oncology

## 2015-02-18 VITALS — BP 101/31 | HR 78 | Temp 98.2°F | Resp 18 | Ht 65.0 in | Wt 189.8 lb

## 2015-02-18 DIAGNOSIS — C50412 Malignant neoplasm of upper-outer quadrant of left female breast: Secondary | ICD-10-CM

## 2015-02-18 DIAGNOSIS — C787 Secondary malignant neoplasm of liver and intrahepatic bile duct: Secondary | ICD-10-CM

## 2015-02-18 LAB — CBC WITH DIFFERENTIAL/PLATELET
BASO%: 0.3 % (ref 0.0–2.0)
Basophils Absolute: 0 10*3/uL (ref 0.0–0.1)
EOS%: 3.1 % (ref 0.0–7.0)
Eosinophils Absolute: 0.1 10*3/uL (ref 0.0–0.5)
HCT: 35.1 % (ref 34.8–46.6)
HGB: 12.1 g/dL (ref 11.6–15.9)
LYMPH%: 31.3 % (ref 14.0–49.7)
MCH: 29.8 pg (ref 25.1–34.0)
MCHC: 34.5 g/dL (ref 31.5–36.0)
MCV: 86.5 fL (ref 79.5–101.0)
MONO#: 0.3 10*3/uL (ref 0.1–0.9)
MONO%: 8.3 % (ref 0.0–14.0)
NEUT%: 57 % (ref 38.4–76.8)
NEUTROS ABS: 2.2 10*3/uL (ref 1.5–6.5)
PLATELETS: 266 10*3/uL (ref 145–400)
RBC: 4.06 10*6/uL (ref 3.70–5.45)
RDW: 13.6 % (ref 11.2–14.5)
WBC: 3.9 10*3/uL (ref 3.9–10.3)
lymph#: 1.2 10*3/uL (ref 0.9–3.3)

## 2015-02-18 LAB — COMPREHENSIVE METABOLIC PANEL
ALBUMIN: 3.7 g/dL (ref 3.5–5.0)
ALT: 13 U/L (ref 0–55)
AST: 20 U/L (ref 5–34)
Alkaline Phosphatase: 45 U/L (ref 40–150)
Anion Gap: 10 mEq/L (ref 3–11)
BUN: 16.3 mg/dL (ref 7.0–26.0)
CHLORIDE: 105 meq/L (ref 98–109)
CO2: 22 meq/L (ref 22–29)
CREATININE: 0.9 mg/dL (ref 0.6–1.1)
Calcium: 8.8 mg/dL (ref 8.4–10.4)
EGFR: >90 mL/min/{1.73_m2} (ref >90)
GLUCOSE: 88 mg/dL (ref 70–140)
Potassium: 3.6 mEq/L (ref 3.5–5.1)
Sodium: 137 mEq/L (ref 136–145)
TOTAL PROTEIN: 7.2 g/dL (ref 6.4–8.3)
Total Bilirubin: 0.43 mg/dL (ref 0.20–1.20)

## 2015-02-18 NOTE — Progress Notes (Signed)
[facility]  Telephone:(336) [phone] Fax:(336) [phone]  OFFICE PROGRESS NOTE     ID: Alyssa May   DOB: Nov 06, 1965  MR#: [id number]  [id number]   PCP:No PCP Per Patient SU: Donnie Mesa, MD RAD ONC: Eppie Gibson, MD; Thea Silversmith M.D., Stevphen Rochester MD  CHIEF COMPLAINT: Metastatic breast cancer  CURRENT TREATMENT: Observation  BREAST CANCER HISTORY: From the original intake note:  Alyssa May is a 49 y.o. [place] woman who first noted a small mass in her left breast 01/2010, shortly after she stopped breast-feeding.  It did not disappear with resumption of her periods and grew over the next year, and particularly since she had her miscarrriage in 12/2010. This brought her to the Emergency Room 03/09/2011 where she was found to have a fluctuant area in the UOQ of the left breast measuring 8.4 cm. The tissue beneath the cystic area extending towards the axilla and medially was described as firm, but not erythematous. The cystic area was lanced and a large amount of serosanguinos material was expressed. The patient was referred to Dr. Verita Lamb who saw her 03/11/2011. By this time the fluid had largely reaccumulated. He incised the lesion, again draining serosanguinous liquid, packed it, and set the patient up for left breast US at the [facility] 03/14/2011. This showed an irregularly marginated mass measuring 7.3 cm, with abnormal axillary adenopathy. On 01/1102013 bilateral mammography confirmed a high-density mass in the left UOQ. This was biopsied, as was a left axillary lymph node. Both biopsies [id number] showed invasive ductal carcinoma, high grade, triple negative, with an Mib-1 of 97%.  Her subsequent history is as detailed below.  INTERVAL HISTORY: Alyssa May returns today for follow up of her stage IV breast cancer accompanied by her daughter Alyssa May. Javiera just had a restaging MRI of the abdomen with contrast which showed no parenchymal liver  lesion. There was no adenopathy or any evidence of metastatic disease. She was supposed to have a PET scan at the same time but she canceled that procedure.  She has taken herself off all medications except for herbal products which she obtains from various sources.  REVIEW OF SYSTEMS: Hadeel is trying to get her house ready for her mother to move in, possibly permanently. She is not otherwise exercising. Sometimes she has blurred vision, particular when she tries to read for long periods of time. She has not had her eyes checked for approximately 2 years, she says. There've been no unusual headaches, dizziness, gait imbalance, nausea, vomiting, or stiff neck. She still has soreness over the left chest surgical area A detailed review of systems today was otherwise negative.[name]  PAST MEDICAL HISTORY: Past Medical History  Diagnosis Date  . Breast abscess 03/2011  . History of bronchitis     last time OCt-Nov 2012  . Joint pain   . History of blood transfusion     in May 2013-no abnormal reaction noted  . Cancer     Left Breast  . Breast cancer     left  . History of radiation therapy 04/10/13-06/03/13    left breast/chest wall,supraclavicular    PAST SURGICAL HISTORY: Past Surgical History  Procedure Laterality Date  . Dilation and curettage of uterus  01/04/11  . Breast mass excision  2013  . Tonsillectomy    . Portacath placement  04/12/2011    Procedure: INSERTION PORT-A-CATH;  Surgeon: Imogene Burn. Tsuei, MD;  Location: [facility];  Service: General;  Laterality: Right;  right subclavian  . Incision  and drainage breast abscess  03/31/11  . Mastectomy modified radical Left 02/27/2013    Procedure: MASTECTOMY MODIFIED RADICAL;  Surgeon: Haywood Lasso, MD;  Location: [facility];  Service: General;  Laterality: Left;  . Breast surgery      FAMILY HISTORY Family History  Problem Relation Age of Onset  . Diabetes Mother   . Hypertension Mother   [name] died from post-operative  complications at age 35; the patient's mother is 54, lives in [place] The patient is an only child.   GYNECOLOGIC HISTORY: First pregnancy to term age 83. Her menstrual cycles were interrupted with chemotherapy, but resumed in 05/2012.  She has not had a menses since her menstrual cycle in 05/2012.   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 She has a good deal of family in this area. Her husband Peytyn Petrucelli worked as a Geophysicist/field seismologist for a Art therapist in [place] but currently he is working for [employer] as a Barrister's clerk. . Their daughter, Alyssa May, is in first grade (September 2016). The patient is a "a messianic Jew"and follows all the Jewish holidays.     ADVANCED DIRECTIVES: Not in place   HEALTH MAINTENANCE: Social History  Substance Use Topics  . Smoking status: Never Smoker   . Smokeless tobacco: Never Used  . Alcohol Use: Yes     Comment: occasionally wine     Colonoscopy:  N/A  PAP:   Bone density: N/A  Lipid panel:    Allergies  Allergen Reactions  . Sulfa Antibiotics Swelling    Current Outpatient Prescriptions  Medication Sig Dispense Refill  . cholecalciferol (VITAMIN D) 1000 UNITS tablet Take 2,000 Units by mouth every morning.    . Multiple Vitamins-Minerals (MULTI-VITAMIN GUMMIES PO) Take 4 tablets by mouth daily.     No current facility-administered medications for this visit.   Objective: Middle-aged [place] American woman who appears well  Filed Vitals:   02/18/15 1558  BP: 101/31  Pulse: 78  Temp: 98.2 F (36.8 C)  Resp: 18     Body mass index is 31.58 kg/(m^2).    ECOG FS: 0 - Asymptomatic   Filed Weights   02/18/15 1558  Weight: 189 lb 12.8 oz (86.093 kg)   Sclerae unicteric, EOMs intact Oropharynx clear, dentition in good repair No cervical or supraclavicular adenopathy Lungs no rales or rhonchi Heart regular rate and rhythm Abd soft, nontender, positive bowel sounds MSK no focal  spinal tenderness, no upper extremity lymphedema Neuro: nonfocal, well oriented, appropriate affect Breasts: The right breast is unremarkable. The left breast is status post mastectomy and radiation. There is no evidence of chest wall recurrence. There is some residual hyperpigmentation. The left axilla is benign.    LAB RESULTS: Lab Results  Component Value Date   WBC 3.9 02/18/2015   NEUTROABS 2.2 02/18/2015   HGB 12.1 02/18/2015   HCT 35.1 02/18/2015   MCV 86.5 02/18/2015   PLT 266 02/18/2015       Chemistry      Component Value Date/Time   NA 137 02/18/2015 1537   NA 136 03/01/2013 0423   K 3.6 02/18/2015 1537   K 4.0 03/01/2013 0423   CL 100 03/01/2013 0423   CL 103 07/05/2012 1448   CO2 22 02/18/2015 1537   CO2 29 03/01/2013 0423   BUN 16.3 02/18/2015 1537   BUN 3* 03/01/2013 0423   CREATININE 0.9 02/18/2015 1537   CREATININE 0.74  03/01/2013 0423      Component Value Date/Time   CALCIUM 8.8 02/18/2015 1537   CALCIUM 7.4* 03/01/2013 0423   ALKPHOS 45 02/18/2015 1537   ALKPHOS 68 02/26/2013 1701   AST 20 02/18/2015 1537   AST 26 02/26/2013 1701   ALT 13 02/18/2015 1537   ALT 10 02/26/2013 1701   BILITOT 0.43 02/18/2015 1537   BILITOT 0.2* 02/26/2013 1701       STUDIES: No results found.    ASSESSMENT: [name] is a 49 y.o.  [place], [place] woman with stage IV breast cancer (liver involvement at presentation but no bone, lung or brain spread):  (1)  Status post upper outer quadrant left breast biopsy 03/18/2011 showing a clinical T4,N1 invasive ductal carcinoma, high grade, triple negative, with an Mib-1 of 97%, with a single liver metastasis pathologically confirmed 03/31/2011.  (2) Status post 4 cycles of Q three-week docetaxel/ doxorubicin/ cyclophosphamide in the neoadjuvant setting, discontinued after 4 cycles due to peripheral neuropathy.  Status post 2 cycles of every 3 week carboplatin/ gemcitabine, for a total of 6 neoadjuvant cycles  altogether, completed 11/09/2011.  (3) The patient refused surgery.  (4) With subsequent tumor re-growth, started Carboplatin/ Gemcitabine on 09/10/2012, with treatment on days 1 and 8 of each 21 day cycle, received six cycles, last dose 01/28/2013; response was moderate  (5) status post left modified radical mastectomy 02/27/2013 for a residual pT4b pN2a invasive ductal carcinoma, repeat prognostic panel again triple negative, with an MIB-1 of 90%.  (6) recurrence at left mastectomy wound excised 04/02/2013  (6) radiation therapy to left chest wall and regional drainage nodes started 04/10/2013 with capecitabine sensitization (1000 mg po BID on treatment days), completed 06/03/2013  (7) the patient is interested in proceeding with reconstruction  (8) restaging liver MRI 07/13/2015shows only a 3 mm lesion consistent with scar   PLAN:  Alleen Borne is now 2 years out from her definitive surgery with no evidence of disease recurrence. This is very favorable.  She had been scheduled for a PET scan but she canceled this. We are going to do a chest x-ray in lieu of that. However the liver MRI is very convincing..  She is not exercising regularly. We discussed that at length. She is taking multiple alternative treatments. She understands I do not have information on how safe those may be. There have been reports of contamination with heavy metals especially in products that may come from [place].  She would like to have her port removed. I don't see any problem with that. I have alerted Dr. [name] to schedule it at his discretion.  She is also a bit behind on her left mammography. I have reordered that for this coming week.  Otherwise I will see Alleen Borne again in 3 months, with lab work and physical exam. She knows to call for any problems that may develop before that visit.    Chauncey Cruel, MD  02/18/2015 4:20 PM

## 2015-02-19 ENCOUNTER — Ambulatory Visit: Payer: Self-pay | Admitting: Surgery

## 2015-02-19 ENCOUNTER — Telehealth: Payer: Self-pay | Admitting: Oncology

## 2015-02-19 NOTE — Telephone Encounter (Signed)
s.w. pt and advised on March 2017 appt....pt will call to sched Mammo

## 2015-02-24 ENCOUNTER — Ambulatory Visit (HOSPITAL_COMMUNITY): Payer: Self-pay

## 2015-05-11 ENCOUNTER — Other Ambulatory Visit: Payer: Self-pay | Admitting: *Deleted

## 2015-05-11 DIAGNOSIS — C50919 Malignant neoplasm of unspecified site of unspecified female breast: Secondary | ICD-10-CM

## 2015-05-11 DIAGNOSIS — C50412 Malignant neoplasm of upper-outer quadrant of left female breast: Secondary | ICD-10-CM

## 2015-05-11 DIAGNOSIS — C787 Secondary malignant neoplasm of liver and intrahepatic bile duct: Secondary | ICD-10-CM

## 2015-05-12 ENCOUNTER — Other Ambulatory Visit: Payer: Self-pay

## 2015-05-12 ENCOUNTER — Ambulatory Visit (HOSPITAL_BASED_OUTPATIENT_CLINIC_OR_DEPARTMENT_OTHER): Payer: Self-pay | Admitting: Oncology

## 2015-05-12 ENCOUNTER — Telehealth: Payer: Self-pay | Admitting: Oncology

## 2015-05-12 ENCOUNTER — Ambulatory Visit: Payer: Self-pay

## 2015-05-12 ENCOUNTER — Ambulatory Visit (HOSPITAL_BASED_OUTPATIENT_CLINIC_OR_DEPARTMENT_OTHER): Payer: Self-pay

## 2015-05-12 VITALS — BP 107/62 | HR 80 | Temp 97.8°F | Resp 18 | Ht 65.0 in | Wt 197.8 lb

## 2015-05-12 DIAGNOSIS — C50912 Malignant neoplasm of unspecified site of left female breast: Secondary | ICD-10-CM

## 2015-05-12 DIAGNOSIS — M79602 Pain in left arm: Secondary | ICD-10-CM

## 2015-05-12 DIAGNOSIS — C50412 Malignant neoplasm of upper-outer quadrant of left female breast: Secondary | ICD-10-CM

## 2015-05-12 DIAGNOSIS — C50919 Malignant neoplasm of unspecified site of unspecified female breast: Secondary | ICD-10-CM

## 2015-05-12 DIAGNOSIS — Z95828 Presence of other vascular implants and grafts: Secondary | ICD-10-CM

## 2015-05-12 DIAGNOSIS — C787 Secondary malignant neoplasm of liver and intrahepatic bile duct: Secondary | ICD-10-CM

## 2015-05-12 LAB — COMPREHENSIVE METABOLIC PANEL
ALBUMIN: 3.8 g/dL (ref 3.5–5.0)
ALT: 25 U/L (ref 0–55)
AST: 30 U/L (ref 5–34)
Alkaline Phosphatase: 66 U/L (ref 40–150)
Anion Gap: 9 mEq/L (ref 3–11)
BUN: 12.9 mg/dL (ref 7.0–26.0)
CHLORIDE: 105 meq/L (ref 98–109)
CO2: 25 meq/L (ref 22–29)
Calcium: 8.9 mg/dL (ref 8.4–10.4)
Creatinine: 0.8 mg/dL (ref 0.6–1.1)
EGFR: >90 mL/min/{1.73_m2} (ref >90)
GLUCOSE: 103 mg/dL (ref 70–140)
POTASSIUM: 4.1 meq/L (ref 3.5–5.1)
SODIUM: 139 meq/L (ref 136–145)
Total Bilirubin: 0.39 mg/dL (ref 0.20–1.20)
Total Protein: 7.9 g/dL (ref 6.4–8.3)

## 2015-05-12 LAB — CBC WITH DIFFERENTIAL/PLATELET
BASO%: 0.4 % (ref 0.0–2.0)
BASOS ABS: 0 10*3/uL (ref 0.0–0.1)
EOS%: 2.2 % (ref 0.0–7.0)
Eosinophils Absolute: 0.1 10*3/uL (ref 0.0–0.5)
HCT: 39.4 % (ref 34.8–46.6)
HEMOGLOBIN: 13 g/dL (ref 11.6–15.9)
LYMPH%: 53 % — ABNORMAL HIGH (ref 14.0–49.7)
MCH: 29.2 pg (ref 25.1–34.0)
MCHC: 33.1 g/dL (ref 31.5–36.0)
MCV: 88 fL (ref 79.5–101.0)
MONO#: 0.4 10*3/uL (ref 0.1–0.9)
MONO%: 10 % (ref 0.0–14.0)
NEUT#: 1.5 10*3/uL (ref 1.5–6.5)
NEUT%: 34.4 % — ABNORMAL LOW (ref 38.4–76.8)
Platelets: 301 10*3/uL (ref 145–400)
RBC: 4.47 10*6/uL (ref 3.70–5.45)
RDW: 13.5 % (ref 11.2–14.5)
WBC: 4.4 10*3/uL (ref 3.9–10.3)
lymph#: 2.3 10*3/uL (ref 0.9–3.3)

## 2015-05-12 LAB — LACTATE DEHYDROGENASE: LDH: 291 U/L — ABNORMAL HIGH (ref 125–245)

## 2015-05-12 MED ORDER — HEPARIN SOD (PORK) LOCK FLUSH 100 UNIT/ML IV SOLN
500.0000 [IU] | Freq: Once | INTRAVENOUS | Status: AC
  Administered 2015-05-12: 500 [IU] via INTRAVENOUS
  Filled 2015-05-12: qty 5

## 2015-05-12 MED ORDER — SODIUM CHLORIDE 0.9% FLUSH
10.0000 mL | INTRAVENOUS | Status: DC | PRN
  Administered 2015-05-12: 10 mL via INTRAVENOUS
  Filled 2015-05-12: qty 10

## 2015-05-12 NOTE — Patient Instructions (Signed)

## 2015-05-12 NOTE — Progress Notes (Signed)
[facility]  [phone] [phone]  OFFICE PROGRESS NOTE     ID: Alyssa May   DOB: 04/22/1965  MR#: [id number]  [id number]   PCP:No PCP Per Patient SU: Alyssa Mesa, MD RAD ONC: Alyssa Gibson, MD; Alyssa May M.D., Alyssa Rochester MD  CHIEF COMPLAINT: Metastatic breast cancer  CURRENT TREATMENT: Observation  BREAST CANCER HISTORY: From the original intake note:  Alyssa May is a 50 y.o. [place] woman who first noted a small mass in her left breast 01/2010, shortly after she stopped breast-feeding.  It did not disappear with resumption of her periods and grew over the next year, and particularly since she had her miscarrriage in 12/2010. This brought her to the Emergency Room 03/09/2011 where she was found to have a fluctuant area in the UOQ of the left breast measuring 8.4 cm. The tissue beneath the cystic area extending towards the axilla and medially was described as firm, but not erythematous. The cystic area was lanced and a large amount of serosanguinos material was expressed. The patient was referred to Dr. Verita May who saw her 03/11/2011. By this time the fluid had largely reaccumulated. He incised the lesion, again draining serosanguinous liquid, packed it, and set the patient up for left breast US at the [facility] 03/14/2011. This showed an irregularly marginated mass measuring 7.3 cm, with abnormal axillary adenopathy. On 01/1102013 bilateral mammography confirmed a high-density mass in the left UOQ. This was biopsied, as was a left axillary lymph node. Both biopsies [id number] showed invasive ductal carcinoma, high grade, triple negative, with an Mib-1 of 97%.  Her subsequent history is as detailed below.  INTERVAL HISTORY: Alyssa May returns today for follow up of her triple negative, metastatic breast cancer. She is currently on no medications except for neutral burst, which apparently has multiple vitamins and other  ingredients. I have asked her to bring that with her so we can review the contents and she promises to do that next visit  REVIEW OF SYSTEMS: Alyssa May has some tightness and cramping of the left chest wall area, where she had her surgery and radiation. This is expected, of course. She is gaining weight. She tells me she has stress eating. She sleeps poorly. Sometimes saw her vision is not clear and she thinks she needs reading glasses. She can be short of breath when climbing stairs and currently they are back in her grandmother's house which means she has to go to the attic all the time. She feels forgetful, but not anxious or depressed. She had a period recently but is also having more hot flashes and this is a concern. Otherwise a detailed review of systems today was noncontributory  PAST MEDICAL HISTORY: Past Medical History  Diagnosis Date  . Breast abscess 03/2011  . History of bronchitis     last time OCt-Nov 2012  . Joint pain   . History of blood transfusion     in May 2013-no abnormal reaction noted  . Cancer     Left Breast  . Breast cancer     left  . History of radiation therapy 04/10/13-06/03/13    left breast/chest wall,supraclavicular    PAST SURGICAL HISTORY: Past Surgical History  Procedure Laterality Date  . Dilation and curettage of uterus  01/04/11  . Breast mass excision  2013  . Tonsillectomy    . Portacath placement  04/12/2011    Procedure: INSERTION PORT-A-CATH;  Surgeon: Alyssa Burn. Alyssa Dover, MD;  Location: [facility];  Service: General;  Laterality: Right;  right subclavian  . Incision and drainage breast abscess  03/31/11  . Mastectomy modified radical Left 02/27/2013    Procedure: MASTECTOMY MODIFIED RADICAL;  Surgeon: Alyssa Lasso, MD;  Location: [facility];  Service: General;  Laterality: Left;  . Breast surgery      FAMILY HISTORY Family History  Problem Relation Age of Onset  . Diabetes Mother   . Hypertension Mother   [name] died from  post-operative complications at age 70; the patient's mother is 59, lives in [place] The patient is an only child.   GYNECOLOGIC HISTORY: First pregnancy to term age 31. Her menstrual cycles were interrupted with chemotherapy, but resumed in 05/2012.  She has not had a menses since her menstrual cycle in 05/2012.   SOCIAL HISTORY: She is a Research scientist (medical), living in [place] but originally from the [place] area. She has a good deal of family in this area. Her husband Alyssa May worked as a Geophysicist/field seismologist for a Art therapist in [place] but currently he is working for [employer] as a Barrister's clerk.  Their daughter, Alyssa May, is in first grade (September 2016).  she is trying to get into a sinus Is coming summer. The patient is a "a messianic Jew" and follows all the Jewish holidays.     ADVANCED DIRECTIVES: Not in place   HEALTH MAINTENANCE: Social History  Substance Use Topics  . Smoking status: Never Smoker   . Smokeless tobacco: Never Used  . Alcohol Use: Yes     Comment: occasionally wine     Colonoscopy:  N/A  PAP:   Bone density: N/A  Lipid panel:    Allergies  Allergen Reactions  . Sulfa Antibiotics Swelling    No current outpatient prescriptions on file.   No current facility-administered medications for this visit.   Objective: Middle-aged [place] American woman In no acute distress  Filed Vitals:   05/12/15 0945  BP: 107/62  Pulse: 80  Temp: 97.8 F (36.6 C)  Resp: 18     Body mass index is 32.92 kg/(m^2).    ECOG FS: 1 - Symptomatic but completely ambulatory   Filed Weights   05/12/15 0945  Weight: 197 lb 12.8 oz (89.721 kg)   Sclerae unicteric, pupils round and equal Oropharynx clear and moist-- no thrush or other lesions No cervical or supraclavicular adenopathy Lungs no rales or rhonchi Heart regular rate and rhythm Abd soft, nontender, positive bowel sounds MSK no focal spinal tenderness, no upper extremity lymphedema Neuro: nonfocal, well oriented,  appropriate affect Breasts: The right breast shows no masses or suspicious findings. The right axilla is benign. The left breast is status post mastectomy and radiation. There is no evidence of chest wall recurrence. The left axilla is benign.   LAB RESULTS: Lab Results  Component Value Date   WBC 3.9 02/18/2015   NEUTROABS 2.2 02/18/2015   HGB 12.1 02/18/2015   HCT 35.1 02/18/2015   MCV 86.5 02/18/2015   PLT 266 02/18/2015       Chemistry      Component Value Date/Time   NA 137 02/18/2015 1537   NA 136 03/01/2013 0423   K 3.6 02/18/2015 1537   K 4.0 03/01/2013 0423   CL 100 03/01/2013 0423   CL 103 07/05/2012 1448   CO2 22 02/18/2015 1537   CO2 29 03/01/2013 0423   BUN 16.3 02/18/2015 1537   BUN 3* 03/01/2013 0423   CREATININE 0.9 02/18/2015 1537   CREATININE 0.74 03/01/2013  0423      Component Value Date/Time   CALCIUM 8.8 02/18/2015 1537   CALCIUM 7.4* 03/01/2013 0423   ALKPHOS 45 02/18/2015 1537   ALKPHOS 68 02/26/2013 1701   AST 20 02/18/2015 1537   AST 26 02/26/2013 1701   ALT 13 02/18/2015 1537   ALT 10 02/26/2013 1701   BILITOT 0.43 02/18/2015 1537   BILITOT 0.2* 02/26/2013 1701       STUDIES: No results found.    ASSESSMENT: Alyssa May is a 50 y.o.  [place], [place] woman with stage IV breast cancer (liver involvement at presentation but no bone, lung or brain spread):  (1)  Status post upper outer quadrant left breast biopsy 03/18/2011 showing a clinical T4,N1 invasive ductal carcinoma, high grade, triple negative, with an Mib-1 of 97%, with a single liver metastasis pathologically confirmed 03/31/2011.  (2) Status post 4 cycles of Q three-week docetaxel/ doxorubicin/ cyclophosphamide in the neoadjuvant setting, discontinued after 4 cycles due to peripheral neuropathy.  Status post 2 cycles of every 3 week carboplatin/ gemcitabine, for a total of 6 neoadjuvant cycles altogether, completed 11/09/2011.  (3) The patient refused  surgery.  (4) With subsequent tumor re-growth, started Carboplatin/ Gemcitabine on 09/10/2012, with treatment on days 1 and 8 of each 21 day cycle, received six cycles, last dose 01/28/2013; response was moderate  (5) status post left modified radical mastectomy 02/27/2013 for a residual pT4b pN2a invasive ductal carcinoma, repeat prognostic panel again triple negative, with an MIB-1 of 90%.  (6) recurrence at left mastectomy wound excised 04/02/2013  (6) radiation therapy to left chest wall and regional drainage nodes started 04/10/2013 with capecitabine sensitization (1000 mg po BID on treatment days), completed 06/03/2013  (7) the patient is interested in proceeding with reconstruction when insurance allows  (8) restaging liver MRI 09/16/2013 shows only a 3 mm lesion consistent with scar   PLAN:  Alyssa May continues in clinical remission. For insurance reasons and perhaps for other reasons as well (she has always been reluctant to undergo tests) we have not restaged her recently. She tells me she will have insurance issues resolved some time this month but that she does not want anything scheduled before [place] which is mid April.  Accordingly I am setting her up for a chest x-ray and limited ultrasound of the abdomen to evaluate the liver and of May. She will return to see me in about 3 months. Hopefully we will have those tests in hand before that visit.  She is also behind on her right mammography. That also will be set up.  Otherwise I have strongly urged her to get into an exercise program. She is appropriately concerned about her diet, and she does understand that there is some relationship even though not a very rigid 1 between weights and breast cancer activity.  As far as her left upper extremity discomfort, I think the best thing for her to do is to continue to do her stretching exercises. I did offer her some physical therapy but at this point she declines.  We have several  drugs that are effective with her hot flashes, but at this point she prefers to take no medications.  She will call with any problems that may develop before her next visit here.   Chauncey Cruel, MD  05/12/2015 9:47 AM

## 2015-05-12 NOTE — Telephone Encounter (Signed)
appt made and avs printed °

## 2015-05-14 ENCOUNTER — Encounter: Payer: Self-pay | Admitting: Oncology

## 2015-05-14 NOTE — Progress Notes (Signed)
No treatment

## 2015-08-11 ENCOUNTER — Other Ambulatory Visit: Payer: Self-pay | Admitting: *Deleted

## 2015-08-11 DIAGNOSIS — C50412 Malignant neoplasm of upper-outer quadrant of left female breast: Secondary | ICD-10-CM

## 2015-08-12 ENCOUNTER — Encounter: Payer: Self-pay | Admitting: Oncology

## 2015-08-12 ENCOUNTER — Ambulatory Visit: Payer: Self-pay | Admitting: Oncology

## 2015-08-12 ENCOUNTER — Other Ambulatory Visit: Payer: Self-pay

## 2015-11-12 ENCOUNTER — Telehealth: Payer: Self-pay | Admitting: *Deleted

## 2015-11-12 NOTE — Telephone Encounter (Signed)
"  I'd like to obtain my records. What do I need to do to obtain these."  Call transferred to [facility] ext [phone].
